# Patient Record
Sex: Female | Born: 1950
Health system: Southern US, Community
[De-identification: ages and names within clinical notes are randomized; demographics above are authoritative.]

## PROBLEM LIST (undated history)

## (undated) DIAGNOSIS — F329 Major depressive disorder, single episode, unspecified: Secondary | ICD-10-CM

## (undated) DIAGNOSIS — F32A Depression, unspecified: Secondary | ICD-10-CM

## (undated) DIAGNOSIS — I10 Essential (primary) hypertension: Secondary | ICD-10-CM

## (undated) DIAGNOSIS — E785 Hyperlipidemia, unspecified: Secondary | ICD-10-CM

## (undated) DIAGNOSIS — E119 Type 2 diabetes mellitus without complications: Secondary | ICD-10-CM

## (undated) DIAGNOSIS — N189 Chronic kidney disease, unspecified: Secondary | ICD-10-CM

## (undated) HISTORY — DX: Chronic kidney disease, unspecified: N18.9

## (undated) HISTORY — DX: Type 2 diabetes mellitus without complications: E11.9

## (undated) HISTORY — DX: Hyperlipidemia, unspecified: E78.5

## (undated) HISTORY — PX: OTHER SURGICAL HISTORY: SHX169

---
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
## 2018-10-10 ENCOUNTER — Other Ambulatory Visit: Payer: Self-pay

## 2018-10-10 ENCOUNTER — Inpatient Hospital Stay (HOSPITAL_COMMUNITY)
Admission: EM | Admit: 2018-10-10 | Discharge: 2018-11-18 | DRG: 177 | Disposition: A | Discharge status: Home / Self Care | Payer: Medicaid Other | Source: Other Acute Inpatient Hospital | Attending: Internal Medicine | Admitting: Internal Medicine

## 2018-10-10 ENCOUNTER — Emergency Department: Payer: HRSA Program

## 2018-10-10 ENCOUNTER — Encounter: Payer: Self-pay | Admitting: *Deleted

## 2018-10-10 ENCOUNTER — Emergency Department
Admission: EM | Admit: 2018-10-10 | Discharge: 2018-10-10 | Disposition: A | Discharge status: Other Acute Inpatient Hospital | Payer: HRSA Program | Attending: Emergency Medicine | Admitting: Emergency Medicine

## 2018-10-10 DIAGNOSIS — T380X5A Adverse effect of glucocorticoids and synthetic analogues, initial encounter: Secondary | ICD-10-CM | POA: Diagnosis not present

## 2018-10-10 DIAGNOSIS — I959 Hypotension, unspecified: Secondary | ICD-10-CM | POA: Diagnosis not present

## 2018-10-10 DIAGNOSIS — E11649 Type 2 diabetes mellitus with hypoglycemia without coma: Secondary | ICD-10-CM | POA: Diagnosis not present

## 2018-10-10 DIAGNOSIS — R0902 Hypoxemia: Secondary | ICD-10-CM | POA: Diagnosis not present

## 2018-10-10 DIAGNOSIS — U071 COVID-19: Secondary | ICD-10-CM | POA: Diagnosis present

## 2018-10-10 DIAGNOSIS — J1289 Other viral pneumonia: Secondary | ICD-10-CM | POA: Diagnosis present

## 2018-10-10 DIAGNOSIS — N179 Acute kidney failure, unspecified: Secondary | ICD-10-CM | POA: Diagnosis not present

## 2018-10-10 DIAGNOSIS — F418 Other specified anxiety disorders: Secondary | ICD-10-CM | POA: Diagnosis present

## 2018-10-10 DIAGNOSIS — I5033 Acute on chronic diastolic (congestive) heart failure: Secondary | ICD-10-CM | POA: Diagnosis present

## 2018-10-10 DIAGNOSIS — F419 Anxiety disorder, unspecified: Secondary | ICD-10-CM | POA: Diagnosis not present

## 2018-10-10 DIAGNOSIS — I10 Essential (primary) hypertension: Secondary | ICD-10-CM | POA: Diagnosis not present

## 2018-10-10 DIAGNOSIS — Z6841 Body Mass Index (BMI) 40.0 and over, adult: Secondary | ICD-10-CM

## 2018-10-10 DIAGNOSIS — G9340 Encephalopathy, unspecified: Secondary | ICD-10-CM | POA: Diagnosis not present

## 2018-10-10 DIAGNOSIS — Z91018 Allergy to other foods: Secondary | ICD-10-CM | POA: Diagnosis not present

## 2018-10-10 DIAGNOSIS — F329 Major depressive disorder, single episode, unspecified: Secondary | ICD-10-CM | POA: Diagnosis present

## 2018-10-10 DIAGNOSIS — J069 Acute upper respiratory infection, unspecified: Secondary | ICD-10-CM | POA: Diagnosis present

## 2018-10-10 DIAGNOSIS — R0602 Shortness of breath: Secondary | ICD-10-CM | POA: Diagnosis present

## 2018-10-10 DIAGNOSIS — E876 Hypokalemia: Secondary | ICD-10-CM | POA: Diagnosis present

## 2018-10-10 DIAGNOSIS — F32A Depression, unspecified: Secondary | ICD-10-CM | POA: Diagnosis present

## 2018-10-10 DIAGNOSIS — IMO0002 Reserved for concepts with insufficient information to code with codable children: Secondary | ICD-10-CM | POA: Diagnosis present

## 2018-10-10 DIAGNOSIS — J9601 Acute respiratory failure with hypoxia: Secondary | ICD-10-CM | POA: Diagnosis present

## 2018-10-10 DIAGNOSIS — I110 Hypertensive heart disease with heart failure: Secondary | ICD-10-CM | POA: Diagnosis present

## 2018-10-10 DIAGNOSIS — J189 Pneumonia, unspecified organism: Secondary | ICD-10-CM

## 2018-10-10 DIAGNOSIS — I471 Supraventricular tachycardia: Secondary | ICD-10-CM | POA: Diagnosis not present

## 2018-10-10 DIAGNOSIS — Z79899 Other long term (current) drug therapy: Secondary | ICD-10-CM | POA: Diagnosis not present

## 2018-10-10 DIAGNOSIS — R509 Fever, unspecified: Secondary | ICD-10-CM | POA: Diagnosis not present

## 2018-10-10 DIAGNOSIS — R739 Hyperglycemia, unspecified: Secondary | ICD-10-CM | POA: Diagnosis not present

## 2018-10-10 DIAGNOSIS — E1165 Type 2 diabetes mellitus with hyperglycemia: Secondary | ICD-10-CM | POA: Diagnosis present

## 2018-10-10 HISTORY — DX: Essential (primary) hypertension: I10

## 2018-10-10 HISTORY — DX: Depression, unspecified: F32.A

## 2018-10-10 LAB — CBC WITH DIFFERENTIAL/PLATELET
Abs Immature Granulocytes: 0.11 10*3/uL — ABNORMAL HIGH (ref 0.00–0.07)
Basophils Absolute: 0 10*3/uL (ref 0.0–0.1)
Basophils Relative: 0 %
Eosinophils Absolute: 0 10*3/uL (ref 0.0–0.5)
Eosinophils Relative: 0 %
HCT: 37.5 % (ref 36.0–46.0)
Hemoglobin: 12.6 g/dL (ref 12.0–15.0)
Immature Granulocytes: 1 %
Lymphocytes Relative: 8 %
Lymphs Abs: 1.3 10*3/uL (ref 0.7–4.0)
MCH: 30.8 pg (ref 26.0–34.0)
MCHC: 33.6 g/dL (ref 30.0–36.0)
MCV: 91.7 fL (ref 80.0–100.0)
Monocytes Absolute: 0.8 10*3/uL (ref 0.1–1.0)
Monocytes Relative: 5 %
Neutro Abs: 12.7 10*3/uL — ABNORMAL HIGH (ref 1.7–7.7)
Neutrophils Relative %: 86 %
Platelets: 327 10*3/uL (ref 150–400)
RBC: 4.09 MIL/uL (ref 3.87–5.11)
RDW: 14.4 % (ref 11.5–15.5)
WBC: 14.9 10*3/uL — ABNORMAL HIGH (ref 4.0–10.5)
nRBC: 0 % (ref 0.0–0.2)

## 2018-10-10 LAB — BLOOD GAS, ARTERIAL
Acid-Base Excess: 5.1 mmol/L — ABNORMAL HIGH (ref 0.0–2.0)
Bicarbonate: 27.8 mmol/L (ref 20.0–28.0)
FIO2: 1
O2 Saturation: 90.6 %
Patient temperature: 37
pCO2 arterial: 34 mmHg (ref 32.0–48.0)
pH, Arterial: 7.52 — ABNORMAL HIGH (ref 7.350–7.450)
pO2, Arterial: 53 mmHg — ABNORMAL LOW (ref 83.0–108.0)

## 2018-10-10 LAB — COMPREHENSIVE METABOLIC PANEL
ALT: 42 U/L (ref 0–44)
AST: 27 U/L (ref 15–41)
Albumin: 3.3 g/dL — ABNORMAL LOW (ref 3.5–5.0)
Alkaline Phosphatase: 42 U/L (ref 38–126)
Anion gap: 13 (ref 5–15)
BUN: 25 mg/dL — ABNORMAL HIGH (ref 8–23)
CO2: 26 mmol/L (ref 22–32)
Calcium: 8.7 mg/dL — ABNORMAL LOW (ref 8.9–10.3)
Chloride: 95 mmol/L — ABNORMAL LOW (ref 98–111)
Creatinine, Ser: 0.99 mg/dL (ref 0.44–1.00)
GFR calc Af Amer: >60 mL/min (ref >60)
GFR calc non Af Amer: 59 mL/min — ABNORMAL LOW (ref >60)
Glucose, Bld: 171 mg/dL — ABNORMAL HIGH (ref 70–99)
Potassium: 3.8 mmol/L (ref 3.5–5.1)
Sodium: 134 mmol/L — ABNORMAL LOW (ref 135–145)
Total Bilirubin: 0.6 mg/dL (ref 0.3–1.2)
Total Protein: 8.7 g/dL — ABNORMAL HIGH (ref 6.5–8.1)

## 2018-10-10 LAB — URINALYSIS, COMPLETE (UACMP) WITH MICROSCOPIC
Bacteria, UA: NONE SEEN
Bilirubin Urine: NEGATIVE
Glucose, UA: NEGATIVE mg/dL
Hgb urine dipstick: NEGATIVE
Ketones, ur: NEGATIVE mg/dL
Nitrite: NEGATIVE
Protein, ur: 30 mg/dL — AB
Specific Gravity, Urine: 1.02 (ref 1.005–1.030)
pH: 5 (ref 5.0–8.0)

## 2018-10-10 LAB — SARS CORONAVIRUS 2 BY RT PCR (HOSPITAL ORDER, PERFORMED IN ~~LOC~~ HOSPITAL LAB): SARS Coronavirus 2: POSITIVE — AB

## 2018-10-10 LAB — LACTIC ACID, PLASMA: Lactic Acid, Venous: 1.2 mmol/L (ref 0.5–1.9)

## 2018-10-10 LAB — FIBRIN DERIVATIVES D-DIMER (ARMC ONLY): Fibrin derivatives D-dimer (ARMC): 614.44 ng/mL (FEU) — ABNORMAL HIGH (ref 0.00–499.00)

## 2018-10-10 MED ORDER — SODIUM CHLORIDE 0.9 % IV SOLN
1.0000 g | Freq: Once | INTRAVENOUS | Status: AC
  Administered 2018-10-10: 21:00:00 1 g via INTRAVENOUS
  Filled 2018-10-10: qty 10

## 2018-10-10 MED ORDER — SODIUM CHLORIDE 0.9 % IV SOLN
500.0000 mg | Freq: Once | INTRAVENOUS | Status: AC
  Administered 2018-10-10: 500 mg via INTRAVENOUS
  Filled 2018-10-10: qty 500

## 2018-10-10 MED ORDER — IPRATROPIUM-ALBUTEROL 0.5-2.5 (3) MG/3ML IN SOLN
3.0000 mL | Freq: Once | RESPIRATORY_TRACT | Status: DC
  Filled 2018-10-10: qty 3

## 2018-10-10 MED ORDER — ALBUTEROL SULFATE HFA 108 (90 BASE) MCG/ACT IN AERS
4.0000 | INHALATION_SPRAY | Freq: Once | RESPIRATORY_TRACT | Status: AC
  Administered 2018-10-10: 4 via RESPIRATORY_TRACT
  Filled 2018-10-10: qty 6.7

## 2018-10-10 NOTE — ED Provider Notes (Signed)
[facility] Emergency Department Provider Note       Time seen: ----------------------------------------- 4:32 PM on 10/10/2018 -----------------------------------------   I have reviewed the triage vital signs and the nursing notes.  HISTORY   Chief Complaint No chief complaint on file.    HPI Christina Clark is a 68 y.o. female with no significant past medical history who presents to the ED for respiratory distress.  Patient arrived to an urgent care with shortness of breath.  Reportedly she has a family member that is COVID positive.  She has had fever and shortness of breath for the past several days.  She denies other complaints, was noted to have oxygen saturations in the 70s.  No past medical history on file.  There are no active problems to display for this patient.  Allergies Patient has no allergy information on record.  Social History Social History   Tobacco Use  . Smoking status: Not on file  Substance Use Topics  . Alcohol use: Not on file  . Drug use: Not on file    Review of Systems Constitutional: Positive for fever Cardiovascular: Negative for chest pain. Respiratory: Positive for shortness of breath Gastrointestinal: Negative for abdominal pain, vomiting and diarrhea. Musculoskeletal: Negative for back pain. Skin: Negative for rash. Neurological: Negative for headaches, focal weakness or numbness.  All systems negative/normal/unremarkable except as stated in the HPI  ____________________________________________   PHYSICAL EXAM:  VITAL SIGNS: ED Triage Vitals [10/10/18 1630]  Enc Vitals Group     BP      Pulse Rate 79     Resp      Temp 99.9 F (37.7 C)     Temp Source Axillary     SpO2      Weight      Height      Head Circumference      Peak Flow      Pain Score      Pain Loc      Pain Edu?      Excl. in GC?    Constitutional: Alert and oriented.  No obvious distress Eyes: Conjunctivae are  normal. Normal extraocular movements. ENT      Head: Normocephalic and atraumatic.      Nose: No congestion/rhinnorhea.      Mouth/Throat: Mucous membranes are moist.      Neck: No stridor. Cardiovascular: Normal rate, regular rhythm. No murmurs, rubs, or gallops. Respiratory: Mild to moderate tachypnea with clear breath sounds Gastrointestinal: Soft and nontender. Normal bowel sounds Musculoskeletal: Nontender with normal range of motion in extremities. No lower extremity tenderness nor edema. Neurologic:  Normal speech and language. No gross focal neurologic deficits are appreciated.  Skin:  Skin is warm, dry and intact. No rash noted. Psychiatric: Mood and affect are normal. Speech and behavior are normal.  ____________________________________________  EKG: Interpreted by me.  Sinus rhythm with a rate of 79 bpm, left bundle branch block, normal axis, long QT  ____________________________________________  ED COURSE:  As part of my medical decision making, I reviewed the following data within the electronic MEDICAL RECORD NUMBER History obtained from family if available, nursing notes, old chart and ekg, as well as notes from prior ED visits. Patient presented for dyspnea, we will assess with labs and imaging as indicated at this time.   Procedures  Christina Clark was evaluated in Emergency Department on 10/10/2018 for the symptoms described in the history of present illness. She was evaluated in the context of the  global COVID-19 pandemic, which necessitated consideration that the patient might be at risk for infection with the SARS-CoV-2 virus that causes COVID-19. Institutional protocols and algorithms that pertain to the evaluation of patients at risk for COVID-19 are in a state of rapid change based on information released by regulatory bodies including the CDC and federal and state organizations. These policies and algorithms were followed during the patient's care in the ED.   ____________________________________________   CRITICAL CARE Performed by: Ulice DashJohnathan E Booker Bhatnagar   Total critical care time: 30 minutes  Critical care time was exclusive of separately billable procedures and treating other patients.  Critical care was necessary to treat or prevent imminent or life-threatening deterioration.  Critical care was time spent personally by me on the following activities: development of treatment plan with patient and/or surrogate as well as nursing, discussions with consultants, evaluation of patient's response to treatment, examination of patient, obtaining history from patient or surrogate, ordering and performing treatments and interventions, ordering and review of laboratory studies, ordering and review of radiographic studies, pulse oximetry and re-evaluation of patient's condition.  LABS (pertinent positives/negatives)  Labs Reviewed  SARS CORONAVIRUS 2 (HOSPITAL ORDER, PERFORMED IN  HOSPITAL LAB) - Abnormal; Notable for the following components:      Result Value   SARS Coronavirus 2 POSITIVE (*)    All other components within normal limits  COMPREHENSIVE METABOLIC PANEL - Abnormal; Notable for the following components:   Sodium 134 (*)    Chloride 95 (*)    Glucose, Bld 171 (*)    BUN 25 (*)    Calcium 8.7 (*)    Total Protein 8.7 (*)    Albumin 3.3 (*)    GFR calc non Af Amer 59 (*)    All other components within normal limits  CBC WITH DIFFERENTIAL/PLATELET - Abnormal; Notable for the following components:   WBC 14.9 (*)    Neutro Abs 12.7 (*)    Abs Immature Granulocytes 0.11 (*)    All other components within normal limits  BLOOD GAS, ARTERIAL - Abnormal; Notable for the following components:   pH, Arterial 7.52 (*)    pO2, Arterial 53 (*)    Acid-Base Excess 5.1 (*)    All other components within normal limits  FIBRIN DERIVATIVES D-DIMER ([facility] ONLY) - Abnormal; Notable for the following components:   Fibrin derivatives  D-dimer ([facility]) 614.44 (*)    All other components within normal limits  CULTURE, BLOOD (ROUTINE X 2)  CULTURE, BLOOD (ROUTINE X 2)  URINE CULTURE  LACTIC ACID, PLASMA  LACTIC ACID, PLASMA  URINALYSIS, COMPLETE (UACMP) WITH MICROSCOPIC    RADIOLOGY Images were viewed by me  Chest x-ray IMPRESSION: Bilateral streaky airspace opacities which may indicate multifocal infection. ____________________________________________   DIFFERENTIAL DIAGNOSIS   COVID-19, hypoxia, PE, anemia  FINAL ASSESSMENT AND PLAN  COVID-19, hypoxia   Plan: The patient had presented for shortness of breath and hypoxia. Patient's labs did reveal leukocytosis but otherwise no acute process. Patient's imaging was concerning for multifocal infection.  She remains on a nonrebreather at this time with oxygen saturations in the mid 80s.  I have [place] in [place].  She remains in the mid 80s but feels like the oxygen is still helping her.  We will not intubate at this time.  I will discuss with the hospital for transfer.  Ulice DashJohnathan E Krystalynn Ridgeway, MD    Note: This note was generated in part or whole with voice recognition software.  Voice recognition is usually quite accurate but there are transcription errors that can and very often do occur. I apologize for any typographical errors that were not detected and corrected.     [name], MD 10/10/18 [phone]

## 2018-10-10 NOTE — ED Provider Notes (Signed)
Patient was accepted in transfer by Christina Clark to the Christina Clark.   [name], MD 10/10/18 Einar Crow

## 2018-10-10 NOTE — ED Notes (Signed)
Pt O2 sat averaging 85% on 15L via non-rebreather. Dr Jimmye Norman aware of sats and placed acceptable parameters at mid-eighties and higher. Monitor alarm adjusted to reflect.

## 2018-10-10 NOTE — ED Provider Notes (Signed)
Patient was doing very well on BiPAP with sats in the mid 90s.  She appears stable for transport on a nonrebreather.   [name], MD 10/10/18 2142

## 2018-10-10 NOTE — ED Notes (Signed)
Pt was informed of diagnosis and reason for transport to [facility] through use of the tele interpreter. Pt verbalized understanding and gave verbal consent.

## 2018-10-10 NOTE — Progress Notes (Signed)
CODE SEPSIS - PHARMACY COMMUNICATION  **Broad Spectrum Antibiotics should be administered within 1 hour of Sepsis diagnosis**  Time Code Sepsis Called/Page Received: 1635  Antibiotics Ordered: None  Spoke with Dr. Jimmye Norman - no abx needed. Pt has COVID.   Christina Clark ,PharmD, BCPS Clinical Pharmacist  10/10/2018  4:52 PM

## 2018-10-10 NOTE — ED Notes (Signed)
[facility]  CALLED  FOR  TRANSFER 

## 2018-10-10 NOTE — ED Notes (Signed)
TRIAGE NOTE: Pt to ED from [facility] for SOB and cough. Pt was reported to have an oxygen saturation of 75% on 15L when EMS arrived on scene. Pt was 88% on 15L NRB upon arrival to ED.   Pt reported to have been living with a family member dx with COVID last week.

## 2018-10-11 ENCOUNTER — Encounter (HOSPITAL_COMMUNITY): Payer: Self-pay

## 2018-10-11 DIAGNOSIS — E1165 Type 2 diabetes mellitus with hyperglycemia: Secondary | ICD-10-CM | POA: Diagnosis present

## 2018-10-11 DIAGNOSIS — IMO0002 Reserved for concepts with insufficient information to code with codable children: Secondary | ICD-10-CM | POA: Diagnosis present

## 2018-10-11 DIAGNOSIS — I10 Essential (primary) hypertension: Secondary | ICD-10-CM

## 2018-10-11 DIAGNOSIS — R739 Hyperglycemia, unspecified: Secondary | ICD-10-CM

## 2018-10-11 LAB — FERRITIN: Ferritin: 493 ng/mL — ABNORMAL HIGH (ref 11–307)

## 2018-10-11 LAB — D-DIMER, QUANTITATIVE: D-Dimer, Quant: 0.66 ug/mL-FEU — ABNORMAL HIGH (ref 0.00–0.50)

## 2018-10-11 LAB — TROPONIN I
Troponin I: <0.03 ng/mL (ref <0.03)
Troponin I: <0.03 ng/mL (ref <0.03)

## 2018-10-11 LAB — C-REACTIVE PROTEIN: CRP: 33.4 mg/dL — ABNORMAL HIGH (ref <1.0)

## 2018-10-11 LAB — PROCALCITONIN: Procalcitonin: 0.1 ng/mL

## 2018-10-11 LAB — ABO/RH: ABO/RH(D): A POS

## 2018-10-11 LAB — HIV ANTIBODY (ROUTINE TESTING W REFLEX): HIV Screen 4th Generation wRfx: NONREACTIVE

## 2018-10-11 LAB — HEMOGLOBIN A1C
Hgb A1c MFr Bld: 8.1 % — ABNORMAL HIGH (ref 4.8–5.6)
Mean Plasma Glucose: 185.77 mg/dL

## 2018-10-11 LAB — LACTATE DEHYDROGENASE: LDH: 264 U/L — ABNORMAL HIGH (ref 98–192)

## 2018-10-11 MED ORDER — SODIUM CHLORIDE 0.9% FLUSH
3.0000 mL | INTRAVENOUS | Status: DC | PRN
  Administered 2018-10-20: 19:00:00 3 mL via INTRAVENOUS
  Filled 2018-10-11: qty 3

## 2018-10-11 MED ORDER — TOCILIZUMAB 400 MG/20ML IV SOLN
800.0000 mg | Freq: Once | INTRAVENOUS | Status: AC
  Administered 2018-10-11: 16:00:00 800 mg via INTRAVENOUS
  Filled 2018-10-11: qty 40

## 2018-10-11 MED ORDER — SODIUM CHLORIDE 0.9 % IV SOLN
200.0000 mg | Freq: Once | INTRAVENOUS | Status: AC
  Administered 2018-10-11: 200 mg via INTRAVENOUS
  Filled 2018-10-11: qty 40

## 2018-10-11 MED ORDER — ONDANSETRON HCL 4 MG PO TABS: 4.0000 mg | ORAL_TABLET | Freq: Four times a day (QID) | ORAL | Status: DC | PRN

## 2018-10-11 MED ORDER — SODIUM CHLORIDE 0.9 % IV SOLN
250.0000 mL | INTRAVENOUS | Status: DC | PRN
  Administered 2018-10-11: 16:00:00 250 mL via INTRAVENOUS

## 2018-10-11 MED ORDER — ENOXAPARIN SODIUM 120 MG/0.8ML ~~LOC~~ SOLN
1.0000 mg/kg | Freq: Two times a day (BID) | SUBCUTANEOUS | Status: DC
  Administered 2018-10-11 (×2): 110 mg via SUBCUTANEOUS
  Filled 2018-10-11 (×4): qty 0.73

## 2018-10-11 MED ORDER — SODIUM CHLORIDE 0.9% FLUSH
3.0000 mL | Freq: Two times a day (BID) | INTRAVENOUS | Status: DC
  Administered 2018-10-11 – 2018-10-24 (×26): 3 mL via INTRAVENOUS

## 2018-10-11 MED ORDER — ACETAMINOPHEN 325 MG PO TABS
650.0000 mg | ORAL_TABLET | Freq: Four times a day (QID) | ORAL | Status: DC | PRN
  Administered 2018-11-01 – 2018-11-07 (×3): 650 mg via ORAL
  Filled 2018-10-11 (×4): qty 2

## 2018-10-11 MED ORDER — ENOXAPARIN SODIUM 60 MG/0.6ML ~~LOC~~ SOLN
0.5000 mg/kg | Freq: Two times a day (BID) | SUBCUTANEOUS | Status: DC
  Administered 2018-10-11 – 2018-11-13 (×65): 55 mg via SUBCUTANEOUS
  Filled 2018-10-11 (×3): qty 0.55
  Filled 2018-10-11 (×2): qty 0.6
  Filled 2018-10-11: qty 0.55
  Filled 2018-10-11 (×2): qty 0.6
  Filled 2018-10-11 (×2): qty 0.55
  Filled 2018-10-11 (×5): qty 0.6
  Filled 2018-10-11: qty 0.55
  Filled 2018-10-11 (×7): qty 0.6
  Filled 2018-10-11: qty 0.55
  Filled 2018-10-11: qty 0.6
  Filled 2018-10-11: qty 0.55
  Filled 2018-10-11: qty 0.6
  Filled 2018-10-11: qty 0.55
  Filled 2018-10-11 (×4): qty 0.6
  Filled 2018-10-11: qty 0.55
  Filled 2018-10-11: qty 0.6
  Filled 2018-10-11: qty 0.55
  Filled 2018-10-11: qty 0.6
  Filled 2018-10-11 (×2): qty 0.55
  Filled 2018-10-11 (×5): qty 0.6
  Filled 2018-10-11: qty 0.55
  Filled 2018-10-11: qty 0.6
  Filled 2018-10-11 (×2): qty 0.55
  Filled 2018-10-11 (×3): qty 0.6
  Filled 2018-10-11: qty 0.55
  Filled 2018-10-11 (×4): qty 0.6
  Filled 2018-10-11 (×2): qty 0.55
  Filled 2018-10-11: qty 0.6
  Filled 2018-10-11: qty 0.55
  Filled 2018-10-11: qty 0.6
  Filled 2018-10-11: qty 0.55
  Filled 2018-10-11 (×2): qty 0.6
  Filled 2018-10-11 (×6): qty 0.55
  Filled 2018-10-11 (×2): qty 0.6

## 2018-10-11 MED ORDER — METHYLPREDNISOLONE SODIUM SUCC 125 MG IJ SOLR
80.0000 mg | Freq: Three times a day (TID) | INTRAMUSCULAR | Status: DC
  Administered 2018-10-11 – 2018-10-14 (×11): 80 mg via INTRAVENOUS
  Filled 2018-10-11 (×11): qty 2

## 2018-10-11 MED ORDER — ONDANSETRON HCL 4 MG/2ML IJ SOLN: 4.0000 mg | Freq: Four times a day (QID) | INTRAMUSCULAR | Status: DC | PRN

## 2018-10-11 MED ORDER — POLYETHYLENE GLYCOL 3350 17 G PO PACK
17.0000 g | PACK | Freq: Every day | ORAL | Status: DC | PRN
  Administered 2018-10-28 – 2018-11-03 (×5): 17 g via ORAL
  Filled 2018-10-11 (×7): qty 1

## 2018-10-11 MED ORDER — SODIUM CHLORIDE 0.9 % IV SOLN
100.0000 mg | INTRAVENOUS | Status: AC
  Administered 2018-10-12 – 2018-10-15 (×4): 100 mg via INTRAVENOUS
  Filled 2018-10-11 (×4): qty 20

## 2018-10-11 NOTE — Progress Notes (Signed)
Recv'd pt via EMS transport on nonrebreather mask @ 15 lpm, RR >30, SPO2 on monitor 85%, A&Ox4, NSR and afebrile. Pt has no c/o pain. Pt does not seem in distress. Pt states that she is just cold and short of breath. Highest SPO2 on moniotr gets is 87%. Pt is spanish speaking, admission and unit orientation given via translator. Discussed with patient the importance of proning and how to do it. Pt states that she does not like her head down because she cant breath that way. Skin assessment and CHG bath performed by myself and RN Abigail. Pt skin intact. Awaiting orders from MD.

## 2018-10-11 NOTE — Progress Notes (Signed)
PROGRESS NOTE  Brief Narrative: Christina Clark is a 68 y.o. female with a history of morbid obesity, HTN, and depression who presented from home with about 1 week of progressive fever, dry cough, and shortness of breath in the setting of known covid-19 contacts. She was hypoxic and had bilateral infiltrates on CXR, and was transferred to [facility], admitted by Dr. Maryland Pink this morning.   Subjective: Spanish-speaking RN at bedside performing interpretation during encounter. Patient feels about the same as she did when admitted, moderately short of breath without chest pain.   Objective: BP (!) 143/64   Pulse 86   Temp 99 F (37.2 C) (Oral)   Resp (!) 28   Ht 5\' 4"  (1.626 m)   Wt 108.7 kg   SpO2 90%   BMI 41.13 kg/m   Gen: No distress, obese Pulm: Clear, no wheezes, tachypneic with HFNC.  CV: RRR, no murmur, no JVD, no edema GI: Soft, NT, ND, +BS  Neuro: Alert and oriented. No focal deficits. Skin: No rashes, lesions or ulcers  Troponin negative x2 Procalcitonin 0.10 not consistent with bacterial infection CRP grossly elevated at 33.4.   Assessment & Plan: Acute respiratory failure with hypoxia due to covid-19 pneumonia:  - Continue airborne, contact precautions. PPE including surgical gown, gloves, face shield, cap, shoe covers, and N-95 used during this encounter in a negative pressure room.  - Check daily labs: CBC w/diff, CMP, d-dimer, fibrinogen, ferritin, LDH, CRP - Enoxaparin 0.5mg /kg q12h per ICU protocol - Maintain euvolemia/net negative.  - Avoid NSAIDs - Recommend proning and aggressive use of incentive spirometry. - Start remdesivir, planning 5 day course, and given actemra x1. The treatment plan and use of medications and known side effects were discussed with patient, they were clearly explained that this medication is being used based on early clinical trials. Complete risks and long-term side effects are unknown, however in the best clinical judgment they seem to  be of some clinical benefit rather than medical risks.  Patient/family agree with the treatment plan and want to receive the given medications. - Discussed with her daughter this afternoon.  [name], MD Pager [phone] 10/11/2018, 2:52 PM

## 2018-10-11 NOTE — Progress Notes (Signed)
Offered pt chocolate ice cream that came with lunch tray but pt sts she is "allergic" to it.... Asked what kind of sx she gets when she eats chocolate and she sts it more of s/e and will start to have vertigo/dizziness sx.... updated in Epic.

## 2018-10-11 NOTE — Progress Notes (Signed)
Pt is sitting at bedside and eating... O2 sat = 88-95%, NSR = 80s, RR = High 20s to mid 30s  Pt denies SOB or pain.... She is A&O x4... no acute distress  Will continue to monitor

## 2018-10-11 NOTE — Progress Notes (Signed)
Spoke w/daughter, Dorise Bullion, at [phone]  Daughter has been updated and answered any questions she had.... Pt was able to speak w/duaghter as well

## 2018-10-11 NOTE — H&P (Addendum)
h         History and Physical  Christina Clark ZOX:[id number] DOB: 1950/09/18 DOA: 10/10/2018  Referring physician: Daryel NovemberJonathan Williams, ER physician PCP: Patient, No Pcp Per  Outpatient Specialists: None Patient coming from: Home & is able to ambulate without assistance  Chief Complaint: Shortness of breath  Please note that patient only speaks Spanish.  H&P was done with use of Spanish speaking nurse present at the bedside  HPI: Christina Clark is a 68 y.o. female with medical history significant for hypertension, depression and morbid obesity who has been living at home and proximally started having fevers 4 days ago.  Patient states multiple family members have tested positive for COVID-19.  Patient started feeling short of breath approximately 2 days ago and symptoms persisting, she came into the emergency room on the evening of 6/10.  ED Course: In the emergency room, patient was noted to be hypoxic requiring additional oxygen supplementation to where she needed 100% high flow oxygen just to maintain oxygen saturations at 88%.  Putting her in a prone position, she was able to get over 90%.  She seemed to be more comfortable with this.  Chest x-ray noted bilateral opacities consistent with multi lobar infection.  Rest the patient's blood work was unremarkable other than a white count of 14.9 and a lactic acid level of 1.2.  D-dimer was elevated at 614.  Patient did have a positive test for COVID-19.  He [facility]  Review of Systems: Patient seen after arrival to floor. Pt states that with her oxygen, she actually feels better and does not really have any complaints.  She has a mild cough, nonproductive  Pt denies any headaches, vision changes, dysphagia, chest pain, palpitations, wheezing, abdominal pain, hematuria, dysuria, constipation, diarrhea, focal extremity numbness weakness or pain.  Review of systems are otherwise  negative   Past Medical History:  Diagnosis Date  . Depression    After husbands death, takes medication  . Hypertension    History reviewed. No pertinent surgical history.  Social History:  reports that she has never smoked. She has never used smokeless tobacco. She reports previous alcohol use. She reports that she does not use drugs.  She says that she ambulates without assistance.  Lives at home with family   No Known Allergies  Family history inquired: Patient states that no medical problems run in her family  Prior to Admission medications   Not on File  Patient is unsure of her home medications.  She said that we would need to ask her daughter  Physical Exam: BP (!) 135/59   Pulse 85   Temp 98.4 F (36.9 C) (Axillary)   Resp (!) 37   Ht 5\' 4"  (1.626 m)   Wt 108.7 kg   SpO2 90%   BMI 41.13 kg/m   General: Alert and oriented x3, no acute distress Eyes: Sclera nonicteric, extraocular movements are intact ENT: Normocephalic and atraumatic, mucous membranes are moist Neck: Thick, narrow airway Cardiovascular: Regular rate and rhythm, S1-S2 Respiratory: Clear to auscultation bilaterally, moderate effort Abdomen: Soft, nontender, nondistended, positive bowel sounds Skin: No skin breaks, tears or lesions Musculoskeletal: No clubbing or cyanosis or edema Psychiatric: Appropriate, no evidence of psychoses Neurologic: No focal deficits          Labs on Admission:  Basic Metabolic Panel: Recent Labs  Lab 10/10/18 1631  NA 134*  K 3.8  CL 95*  CO2 26  GLUCOSE 171*  BUN 25*  CREATININE 0.99  CALCIUM 8.7*   Liver Function Tests: Recent Labs  Lab 10/10/18 1631  AST 27  ALT 42  ALKPHOS 42  BILITOT 0.6  PROT 8.7*  ALBUMIN 3.3*   No results for input(s): LIPASE, AMYLASE in the last 168 hours. No results for input(s): AMMONIA in the last 168 hours. CBC: Recent Labs  Lab 10/10/18 1631  WBC 14.9*  NEUTROABS 12.7*  HGB 12.6  HCT 37.5  MCV 91.7  PLT  327   Cardiac Enzymes: No results for input(s): CKTOTAL, CKMB, CKMBINDEX, TROPONINI in the last 168 hours.  BNP (last 3 results) No results for input(s): BNP in the last 8760 hours.  ProBNP (last 3 results) No results for input(s): PROBNP in the last 8760 hours.  CBG: No results for input(s): GLUCAP in the last 168 hours.  Radiological Exams on Admission: Dg Chest Port 1 View  Result Date: 10/10/2018 CLINICAL DATA:  Fever and shortness of breath EXAM: PORTABLE CHEST 1 VIEW COMPARISON:  None. FINDINGS: There are bilateral streaky airspace opacities, worst in the right upper lobe. Cardiomediastinal contours are normal. No pneumothorax or sizable pleural effusion. IMPRESSION: Bilateral streaky airspace opacities which may indicate multifocal infection. Electronically Signed   By: Ulyses Jarred M.D.   On: 10/10/2018 17:30    EKG: Sinus rhythm with left bundle branch block.  There is no previous EKG to compare to  Assessment/Plan Present on Admission: . Acute respiratory failure with hypoxia ([place]) secondary to COVID-19 virus: Patient with significant hypoxia.  At this time, she is requiring maximum high flow oxygen just to maintain oxygen saturations greater than 90%.  She is at high risk of further respiratory failure and possible ventilator support.  She is actually a bit more comfortable now.  CRP pending and if greater than 7, will start Actemra.  IV steroids ordered.  Initiated protocol for Remdisivir.   Marland Kitchen Hypertension: Patient states that she is unsure what medication she takes for this.  Will need to clarify from her daughter.  . Depression: Patient states that she is unsure of what medicines she takes for this.  Will need to clarify from family.  Morbid obesity: Patient meets criteria with BMI greater than 40  Hyperglycemia: Mild.  Will check A1c.  Principal Problem:   Acute respiratory failure with hypoxia (HCC) Active Problems:   Hypertension   Depression   Acute  respiratory disease due to COVID-19 virus   Morbid obesity with BMI of 40.0-44.9, adult (HCC)   DVT prophylaxis: On Lovenox, adjusted for COVID  Code Status: Full code as confirmed by patient  Family Communication: We will discuss with family later today  Disposition Plan: Hopefully patient's oxygenation will improve and we can discharge home  Consults called: None  Admission status: Given need for acute hospital services and patient expected to be her past 2 midnights, admit as inpatient to ICU  Spent 40 minutes in the care of this critically ill patient including medical decision making, face-to-face examination and review of patient's records/labs/radiology studies   Annita Brod MD [facility] Pager [phone]  If 7PM-7AM, please contact night-coverage [email or web address] Password [email or web address]  10/11/2018, 6:01 AM

## 2018-10-11 NOTE — Consult Note (Signed)
NAME:  Christina Clark, MRN:  [id number], DOB:  [birth year], LOS: 1 ADMISSION DATE:  10/10/2018, CONSULTATION DATE:  10/11/2018 REFERRING MD:  Mathews Robinsons MD, CHIEF COMPLAINT: COVID 46 pneumonia  Brief History   68 year old with hypertension, morbid obesity, depression admitted with COVID-19 pneumonia, acute respiratory failure requiring high flow nasal cannula.  Past Medical History  Hypertension, morbid obesity, depression  Significant Hospital Events   6/11- Admit  Consults:  PCCM  Procedures:    Significant Diagnostic Tests:    Micro Data:    Antimicrobials/COVID therapy  Remdesivir Steroids  Actemra  Interim history/subjective:    Objective   Blood pressure (!) 143/64, pulse 86, temperature 99 F (37.2 C), temperature source Oral, resp. rate (!) 28, height 5\' 4"  (1.626 m), weight 108.7 kg, SpO2 90 %.    FiO2 (%):  [100 %] 100 %   Intake/Output Summary (Last 24 hours) at 10/11/2018 1350 Last data filed at 10/11/2018 1300 Gross per 24 hour  Intake 103 ml  Output 0 ml  Net 103 ml   Filed Weights   10/10/18 2300  Weight: 108.7 kg    Examination: Gen:      No acute distress, obese HEENT:  EOMI, sclera anicteric Neck:     No masses; no thyromegaly Lungs:    Clear to auscultation bilaterally; normal respiratory effort CV:         Regular rate and rhythm; no murmurs Abd:      + bowel sounds; soft, non-tender; no palpable masses, no distension Ext:    No edema; adequate peripheral perfusion Skin:      Warm and dry; no rash Neuro: alert and oriented x 3 Psych: normal mood and affect  Resolved Hospital Problem list     Assessment & Plan:  68 year old with COVID-19 pneumonia  Severe respiratory failure Sats are stable on high flow nasal cannula Does not need intubation at present Encourage weight pronating although she prefers to lie on her side down her stomach Monitoring in ICU  COVID-19 pneumonia Started on steroids, Remicade severe, Actemra  Discussed convalescent plasma with patient.  She does not want to enroll in the study.  Best practice:  Diet: NPO Pain/Anxiety/Delirium protocol (if indicated): NA VAP protocol (if indicated): NA DVT prophylaxis: Lovenox GI prophylaxis: NA Glucose control: Monitor sugars Mobility: Bed Code Status: Full Family Communication: per TRH Disposition: ICU  Labs   CBC: Recent Labs  Lab 10/10/18 1631  WBC 14.9*  NEUTROABS 12.7*  HGB 12.6  HCT 37.5  MCV 91.7  PLT 983    Basic Metabolic Panel: Recent Labs  Lab 10/10/18 1631  NA 134*  K 3.8  CL 95*  CO2 26  GLUCOSE 171*  BUN 25*  CREATININE 0.99  CALCIUM 8.7*   GFR: Estimated Creatinine Clearance: 67.3 mL/min (by C-G formula based on SCr of 0.99 mg/dL). Recent Labs  Lab 10/10/18 1631 10/11/18 0550  PROCALCITON  --  0.10  WBC 14.9*  --   LATICACIDVEN 1.2  --     Liver Function Tests: Recent Labs  Lab 10/10/18 1631  AST 27  ALT 42  ALKPHOS 42  BILITOT 0.6  PROT 8.7*  ALBUMIN 3.3*   No results for input(s): LIPASE, AMYLASE in the last 168 hours. No results for input(s): AMMONIA in the last 168 hours.  ABG    Component Value Date/Time   PHART 7.52 (H) 10/10/2018 1647   PCO2ART 34 10/10/2018 1647   PO2ART 53 (L) 10/10/2018 1647   HCO3 27.8  10/10/2018 1647   O2SAT 90.6 10/10/2018 1647     Coagulation Profile: No results for input(s): INR, PROTIME in the last 168 hours.  Cardiac Enzymes: Recent Labs  Lab 10/11/18 0550 10/11/18 1140  TROPONINI <0.03 <0.03    HbA1C: Hgb A1c MFr Bld  Date/Time Value Ref Range Status  10/11/2018 05:50 AM 8.1 (H) 4.8 - 5.6 % Final    Comment:    (NOTE) Pre diabetes:          5.7%-6.4% Diabetes:              >6.4% Glycemic control for   <7.0% adults with diabetes     CBG: No results for input(s): GLUCAP in the last 168 hours.  Review of Systems:     All negative; except for those that are bolded, which indicate positives.  Constitutional: weight loss,  weight gain, night sweats, fevers, chills, fatigue, weakness.  HEENT: headaches, sore throat, sneezing, nasal congestion, post nasal drip, difficulty swallowing, tooth/dental problems, visual complaints, visual changes, ear aches. Neuro: difficulty with speech, weakness, numbness, ataxia. CV:  chest pain, orthopnea, PND, swelling in lower extremities, dizziness, palpitations, syncope.  Resp: cough, hemoptysis, dyspnea, wheezing. GI: heartburn, indigestion, abdominal pain, nausea, vomiting, diarrhea, constipation, change in bowel habits, loss of appetite, hematemesis, melena, hematochezia.  GU: dysuria, change in color of urine, urgency or frequency, flank pain, hematuria. MSK: joint pain or swelling, decreased range of motion. Psych: change in mood or affect, depression, anxiety, suicidal ideations, homicidal ideations. Skin: rash, itching, bruising.  Past Medical History  She,  has a past medical history of Depression and Hypertension.   Surgical History   History reviewed. No pertinent surgical history.   Social History   reports that she has never smoked. She has never used smokeless tobacco. She reports previous alcohol use. She reports that she does not use drugs.   Family History   Her family history is not on file.   Allergies Allergies  Allergen Reactions  . Chocolate     Reports vertigo sx     Home Medications  Prior to Admission medications   Medication Sig Start Date End Date Taking? Authorizing Provider  hydrochlorothiazide (HYDRODIURIL) 25 MG tablet Take 25 mg by mouth daily.   Yes [provider]  losartan (COZAAR) 50 MG tablet Take 50 mg by mouth daily.   Yes [provider]  sertraline (ZOLOFT) 50 MG tablet Take 50 mg by mouth daily.   Yes [provider]    The patient is critically ill with multiple organ system failure and requires high complexity decision making for assessment and support, frequent evaluation and titration of  therapies, advanced monitoring, review of radiographic studies and interpretation of complex data.   Critical Care Time devoted to patient care services, exclusive of separately billable procedures, described in this note is 35 minutes.   Chilton GreathousePraveen Kamon Fahr MD [facility] Pager [phone] If no answer call [phone] 10/11/2018, 1:58 PM

## 2018-10-11 NOTE — Progress Notes (Signed)
RT NOTE:  Pt arrived by [facility] on NR[facility], Spo2 83%. Pt switched to Heated HFNC 40L / 100%, Spo2 increased to 94%. RT will monitor and titrate per patient needs.

## 2018-10-11 NOTE — Progress Notes (Signed)
Spoke with patient's daughter, Saleema, Weppler [phone] and gave her updates on patient. I told Sheala that we could Face Time with her a little later so that she could see her mother. Patient will call if she has further questions regarding her mothers condition.

## 2018-10-11 NOTE — Progress Notes (Signed)
Pt SPO2 continues at 85-87%, MD aware. However, pt WOB has increased.  She is moaning and grunting. Had RT assess, and pt will be put on heated Hi-Flow in hopes to improve wob. MD is ok w/SPO2 where it is as long as she is asymptomatic.

## 2018-10-12 LAB — CBC WITH DIFFERENTIAL/PLATELET
Abs Immature Granulocytes: 0.22 10*3/uL — ABNORMAL HIGH (ref 0.00–0.07)
Basophils Absolute: 0 10*3/uL (ref 0.0–0.1)
Basophils Relative: 0 %
Eosinophils Absolute: 0.1 10*3/uL (ref 0.0–0.5)
Eosinophils Relative: 0 %
HCT: 39 % (ref 36.0–46.0)
Hemoglobin: 12.7 g/dL (ref 12.0–15.0)
Immature Granulocytes: 1 %
Lymphocytes Relative: 5 %
Lymphs Abs: 1.1 10*3/uL (ref 0.7–4.0)
MCH: 30.4 pg (ref 26.0–34.0)
MCHC: 32.6 g/dL (ref 30.0–36.0)
MCV: 93.3 fL (ref 80.0–100.0)
Monocytes Absolute: 0.5 10*3/uL (ref 0.1–1.0)
Monocytes Relative: 2 %
Neutro Abs: 21.4 10*3/uL — ABNORMAL HIGH (ref 1.7–7.7)
Neutrophils Relative %: 92 %
Platelets: 421 10*3/uL — ABNORMAL HIGH (ref 150–400)
RBC: 4.18 MIL/uL (ref 3.87–5.11)
RDW: 14.4 % (ref 11.5–15.5)
WBC: 23.2 10*3/uL — ABNORMAL HIGH (ref 4.0–10.5)
nRBC: 0 % (ref 0.0–0.2)

## 2018-10-12 LAB — COMPREHENSIVE METABOLIC PANEL
ALT: 45 U/L — ABNORMAL HIGH (ref 0–44)
AST: 21 U/L (ref 15–41)
Albumin: 3.1 g/dL — ABNORMAL LOW (ref 3.5–5.0)
Alkaline Phosphatase: 46 U/L (ref 38–126)
Anion gap: 16 — ABNORMAL HIGH (ref 5–15)
BUN: 39 mg/dL — ABNORMAL HIGH (ref 8–23)
CO2: 24 mmol/L (ref 22–32)
Calcium: 8.8 mg/dL — ABNORMAL LOW (ref 8.9–10.3)
Chloride: 94 mmol/L — ABNORMAL LOW (ref 98–111)
Creatinine, Ser: 0.89 mg/dL (ref 0.44–1.00)
GFR calc Af Amer: >60 mL/min (ref >60)
GFR calc non Af Amer: >60 mL/min (ref >60)
Glucose, Bld: 269 mg/dL — ABNORMAL HIGH (ref 70–99)
Potassium: 3.6 mmol/L (ref 3.5–5.1)
Sodium: 134 mmol/L — ABNORMAL LOW (ref 135–145)
Total Bilirubin: 0.2 mg/dL — ABNORMAL LOW (ref 0.3–1.2)
Total Protein: 8.9 g/dL — ABNORMAL HIGH (ref 6.5–8.1)

## 2018-10-12 LAB — D-DIMER, QUANTITATIVE: D-Dimer, Quant: 0.83 ug/mL-FEU — ABNORMAL HIGH (ref 0.00–0.50)

## 2018-10-12 LAB — URINE CULTURE: Special Requests: NORMAL

## 2018-10-12 LAB — LACTATE DEHYDROGENASE: LDH: 270 U/L — ABNORMAL HIGH (ref 98–192)

## 2018-10-12 LAB — INTERLEUKIN-6, PLASMA: Interleukin-6, Plasma: 42.5 pg/mL — ABNORMAL HIGH (ref 0.0–12.2)

## 2018-10-12 LAB — FERRITIN: Ferritin: 685 ng/mL — ABNORMAL HIGH (ref 11–307)

## 2018-10-12 LAB — C-REACTIVE PROTEIN: CRP: 31.9 mg/dL — ABNORMAL HIGH (ref <1.0)

## 2018-10-12 LAB — GLUCOSE, CAPILLARY
Glucose-Capillary: 303 mg/dL — ABNORMAL HIGH (ref 70–99)
Glucose-Capillary: 322 mg/dL — ABNORMAL HIGH (ref 70–99)
Glucose-Capillary: 291 mg/dL — ABNORMAL HIGH (ref 70–99)

## 2018-10-12 MED ORDER — LOSARTAN POTASSIUM 50 MG PO TABS
50.0000 mg | ORAL_TABLET | Freq: Every day | ORAL | Status: DC
  Administered 2018-10-13 – 2018-10-18 (×6): 50 mg via ORAL
  Filled 2018-10-12 (×7): qty 1

## 2018-10-12 MED ORDER — INSULIN ASPART 100 UNIT/ML ~~LOC~~ SOLN
0.0000 [IU] | Freq: Every day | SUBCUTANEOUS | Status: DC
  Administered 2018-10-12 – 2018-10-13 (×2): 3 [IU] via SUBCUTANEOUS

## 2018-10-12 MED ORDER — HYDROCHLOROTHIAZIDE 25 MG PO TABS
25.0000 mg | ORAL_TABLET | Freq: Every day | ORAL | Status: DC
  Administered 2018-10-13 – 2018-10-18 (×6): 25 mg via ORAL
  Filled 2018-10-12 (×7): qty 1

## 2018-10-12 MED ORDER — INSULIN ASPART 100 UNIT/ML ~~LOC~~ SOLN
0.0000 [IU] | Freq: Three times a day (TID) | SUBCUTANEOUS | Status: DC
  Administered 2018-10-12: 17:00:00 7 [IU] via SUBCUTANEOUS
  Administered 2018-10-13: 17:00:00 9 [IU] via SUBCUTANEOUS
  Administered 2018-10-13 (×2): 7 [IU] via SUBCUTANEOUS

## 2018-10-12 MED ORDER — INSULIN DETEMIR 100 UNIT/ML ~~LOC~~ SOLN
5.0000 [IU] | Freq: Every day | SUBCUTANEOUS | Status: DC
  Administered 2018-10-12: 5 [IU] via SUBCUTANEOUS
  Filled 2018-10-12: qty 0.05

## 2018-10-12 MED ORDER — SERTRALINE HCL 50 MG PO TABS
50.0000 mg | ORAL_TABLET | Freq: Every day | ORAL | Status: DC
  Administered 2018-10-12 – 2018-11-18 (×37): 50 mg via ORAL
  Filled 2018-10-12 (×38): qty 1

## 2018-10-12 NOTE — Progress Notes (Signed)
PROGRESS NOTE                                                                                                                                                                                                             Patient Demographics:    Charnel Giles, is a 68 y.o. female, DOB - 24-May-1950, GEZ:[id number]  Outpatient Primary MD for the patient is Patient, No Pcp Per    LOS - 2  Admit date - 10/10/2018       Patient only speaks [employer] and Marketing executive used  HPI: 68 year old Spanish-speaking female with past medical history of morbid obesity, hypertension and depression admitted to the hospitalist service on [place] presenting to the emergency room with 1 week of fever, cough and shortness of breath in the setting of known COVID-19 contacts.  Patient found to be COVID positive as well as hypoxic with bilateral infiltrates on chest x-ray.  Requiring oxygen support of high flow.  Started on Remdisivir, steroids and given Actemra x1.  No history of diabetes, but noted to be hyperglycemic.  A1c checked and patient found to have A1c of 8.5.   Subjective:   Patient doing okay.  Still requires 100% high flow oxygen on 40% FiO2.  She says that she is comfortable and breathing okay.  Denies any chest pain, dyspnea   Assessment  & Plan :    Principal Problem:   Acute respiratory failure with hypoxia (HCC) secondary to COVID-19: 1 dose of Actemra.  Continue Remdisivir plus IV steroids and oxygen support.  Despite high need for oxygen, appears comfortable.  CRP improving although ferritin, d-dimer, LDH and platelets worse. Active Problems:   Hypertension: Blood pressure stable.  Daughter provided list of home medications, will restart Cozaar and HCTZ.    Depression: Now that we have gotten list of her home medications, will restart Zoloft    Morbid obesity with BMI of 40.0-44.9, adult [facility]): Patient meets  criteria with BMI greater than 40.    Diabetes mellitus type 2, uncontrolled ([place]): A1c of 8.5.  New diagnosis.  Have started low-dose Levemir and sliding scale NovoLog.  Leukocytosis: Worse today, likely in the setting of steroids  FiO2 (%):  [100 %] 100 %  ABG     Component Value Date/Time   PHART 7.52 (H) 10/10/2018 1647   PCO2ART  34 10/10/2018 1647   PO2ART 53 (L) 10/10/2018 1647   HCO3 27.8 10/10/2018 1647   O2SAT 90.6 10/10/2018 1647    COVID-19 Labs  Recent Labs    10/11/18 0550 10/12/18 0538  DDIMER 0.66* 0.83*  FERRITIN 493* 685*  LDH 264* 270*  CRP 33.4* 31.9*    Lab Results  Component Value Date   SARSCOV2NAA POSITIVE (A) 10/10/2018     Hepatic Function Latest Ref Rng & Units 10/12/2018 10/10/2018  Total Protein 6.5 - 8.1 g/dL 8.9(H) 8.7(H)  Albumin 3.5 - 5.0 g/dL 3.1(L) 3.3(L)  AST 15 - 41 U/L 21 27  ALT 0 - 44 U/L 45(H) 42  Alk Phosphatase 38 - 126 U/L 46 42  Total Bilirubin 0.3 - 1.2 mg/dL 0.2(L) 0.6     No results found for: BNP     Condition : Stable, slowly improving  Family Communication  : Family video conference in during examination  Code Status : Full code  Diet : Add carb modified  Diet Order            Diet Heart Room service appropriate? Yes; Fluid consistency: Thin  Diet effective now               Disposition Plan  : Stable, attempting to decrease below 40% FiO2 on 100% nonrebreather leads to episodes of oxygen desaturation in mid 80s, for now, keep in ICU  Consults  : Critical care  Procedures  : None  PUD Prophylaxis : IV Protonix  DVT Prophylaxis  : Weight-based Lovenox  Lab Results  Component Value Date   PLT 421 (H) 10/12/2018    Inpatient Medications  Scheduled Meds: . enoxaparin (LOVENOX) injection  0.5 mg/kg Subcutaneous Q12H  . insulin aspart  0-5 Units Subcutaneous QHS  . insulin aspart  0-9 Units Subcutaneous TID WC  . insulin detemir  5 Units Subcutaneous QHS  . methylPREDNISolone  (SOLU-MEDROL) injection  80 mg Intravenous Q8H  . sodium chloride flush  3 mL Intravenous Q12H   Continuous Infusions: . sodium chloride Stopped (10/12/18 1042)  . remdesivir 100 mg in NS 250 mL 100 mg (10/12/18 0904)   PRN Meds:.sodium chloride, acetaminophen, ondansetron **OR** ondansetron (ZOFRAN) IV, polyethylene glycol, sodium chloride flush  Antibiotics  :    Anti-infectives (From admission, onward)   Start     Dose/Rate Route Frequency Ordered Stop   10/12/18 0900  remdesivir 100 mg in sodium chloride 0.9 % 230 mL IVPB     100 mg 500 mL/hr over 30 Minutes Intravenous Every 24 hours 10/11/18 0745 10/16/18 0859   10/11/18 0900  remdesivir 200 mg in sodium chloride 0.9 % 210 mL IVPB     200 mg 500 mL/hr over 30 Minutes Intravenous Once 10/11/18 0745 10/11/18 1617       Annita Brod M.D on 10/12/2018 at 4:56 PM  To page go to [email or web address] - password [facility]  [facility] -  Office  [phone]   See all Orders from today for further details    Objective:   Vitals:   10/12/18 1200 10/12/18 1300 10/12/18 1400 10/12/18 1521  BP: 134/63 (!) 130/57 130/63   Pulse: 72 80 80 72  Resp: 18 14 (!) 21 (!) 29  Temp: 98 F (36.7 C)     TempSrc:      SpO2: 93% 90% 93% 93%  Weight:      Height:        Wt Readings from Last 3 Encounters:  10/10/18 108.7  kg  10/10/18 109 kg     Intake/Output Summary (Last 24 hours) at 10/12/2018 1656 Last data filed at 10/12/2018 1500 Gross per 24 hour  Intake 924.52 ml  Output 1450 ml  Net -525.48 ml     Physical Exam  Gen: Alert and oriented x3, no acute distress HEENT: Normocephalic and atraumatic, mucous membranes are moist Neck: Supple, no JVD CV: Regular rate and rhythm, S1-S2 Lungs: Better inspiratory effort, clear to auscultation bilaterally Abd: Soft, nontender, nondistended, positive bowel sounds Extremities: No clubbing cyanosis or edema Neuro: No focal deficits Skin: No skin breaks, tears or lesions  Psychiatry: Appropriate, no evidence of psychoses    Data Review:    CBC Recent Labs  Lab 10/10/18 1631 10/12/18 0538  WBC 14.9* 23.2*  HGB 12.6 12.7  HCT 37.5 39.0  PLT 327 421*  MCV 91.7 93.3  MCH 30.8 30.4  MCHC 33.6 32.6  RDW 14.4 14.4  LYMPHSABS 1.3 1.1  MONOABS 0.8 0.5  EOSABS 0.0 0.1  BASOSABS 0.0 0.0    Chemistries  Recent Labs  Lab 10/10/18 1631 10/12/18 0538  NA 134* 134*  K 3.8 3.6  CL 95* 94*  CO2 26 24  GLUCOSE 171* 269*  BUN 25* 39*  CREATININE 0.99 0.89  CALCIUM 8.7* 8.8*  AST 27 21  ALT 42 45*  ALKPHOS 42 46  BILITOT 0.6 0.2*   ------------------------------------------------------------------------------------------------------------------ No results for input(s): CHOL, HDL, LDLCALC, TRIG, CHOLHDL, LDLDIRECT in the last 72 hours.  Lab Results  Component Value Date   HGBA1C 8.1 (H) 10/11/2018   ------------------------------------------------------------------------------------------------------------------ No results for input(s): TSH, T4TOTAL, T3FREE, THYROIDAB in the last 72 hours.  Invalid input(s): FREET3  Cardiac Enzymes Recent Labs  Lab 10/11/18 0550 10/11/18 1140  TROPONINI <0.03 <0.03   ------------------------------------------------------------------------------------------------------------------ No results found for: BNP  Micro Results Recent Results (from the past 240 hour(s))  Blood Culture (routine x 2)     Status: None (Preliminary result)   Collection Time: 10/10/18  4:31 PM   Specimen: BLOOD  Result Value Ref Range Status   Specimen Description BLOOD LEFT ANTECUBITAL  Final   Special Requests   Final    BOTTLES DRAWN AEROBIC AND ANAEROBIC Blood Culture adequate volume   Culture   Final    NO GROWTH 2 DAYS Performed at [facility], [address] [place], [place] [address]    Report Status PENDING  Incomplete  Blood Culture (routine x 2)     Status: None (Preliminary result)   Collection  Time: 10/10/18  4:31 PM   Specimen: BLOOD  Result Value Ref Range Status   Specimen Description BLOOD RIGHT ANTECUBITAL  Final   Special Requests   Final    BOTTLES DRAWN AEROBIC AND ANAEROBIC Blood Culture results may not be optimal due to an excessive volume of blood received in culture bottles   Culture   Final    NO GROWTH 2 DAYS Performed at [facility], [address], [place], [place] [address]    Report Status PENDING  Incomplete  SARS Coronavirus 2 (CEPHEID- Performed in [place] hospital lab), Hosp Order     Status: Abnormal   Collection Time: 10/10/18  4:47 PM   Specimen: Nasopharyngeal Swab  Result Value Ref Range Status   SARS Coronavirus 2 POSITIVE (A) NEGATIVE Final    Comment: RESULT CALLED TO, READ BACK BY AND VERIFIED WITH: TOM NAGY 10/10/18 @ [place] (NOTE) If result is NEGATIVE SARS-CoV-2 target nucleic acids are NOT  DETECTED. The SARS-CoV-2 RNA is generally detectable in upper and lower  respiratory specimens during the acute phase of infection. The lowest  concentration of SARS-CoV-2 viral copies this assay can detect is 250  copies / mL. A negative result does not preclude SARS-CoV-2 infection  and should not be used as the sole basis for treatment or other  patient management decisions.  A negative result may occur with  improper specimen collection / handling, submission of specimen other  than nasopharyngeal swab, presence of viral mutation(s) within the  areas targeted by this assay, and inadequate number of viral copies  (<250 copies / mL). A negative result must be combined with clinical  observations, patient history, and epidemiological information. If result is POSITIVE SARS-CoV-2 target nucleic acids are DETECTED. The SARS -CoV-2 RNA is generally detectable in upper and lower  respiratory specimens during the acute phase of infection.  Positive  results are indicative of active infection with SARS-CoV-2.  Clinical  correlation with  patient history and other diagnostic information is  necessary to determine patient infection status.  Positive results do  not rule out bacterial infection or co-infection with other viruses. If result is PRESUMPTIVE POSTIVE SARS-CoV-2 nucleic acids MAY BE PRESENT.   A presumptive positive result was obtained on the submitted specimen  and confirmed on repeat testing.  While 2019 novel coronavirus  (SARS-CoV-2) nucleic acids may be present in the submitted sample  additional confirmatory testing may be necessary for epidemiological  and / or clinical management purposes  to differentiate between  SARS-CoV-2 and other Sarbecovirus currently known to infect humans.  If clinically indicated additional testing with an alternate test  methodology [phone]) is advise d. The SARS-CoV-2 RNA is generally  detectable in upper and lower respiratory specimens during the acute  phase of infection. The expected result is Negative. Fact Sheet for Patients:  StrictlyIdeas.no Fact Sheet for Healthcare Providers: BankingDealers.co.za This test is not yet approved or cleared by the [place] FDA and has been authorized for detection and/or diagnosis of SARS-CoV-2 by FDA under an Emergency Use Authorization (EUA).  This EUA will remain in effect (meaning this test can be used) for the duration of the COVID-19 declaration under Section 564(b)(1) of the Act, 21 U.S.C. section 360bbb-3(b)(1), unless the authorization is terminated or revoked sooner. Performed at [facility], [address], [place], [place] [address]   Urine culture     Status: None   Collection Time: 10/10/18  8:39 PM   Specimen: Urine, Random  Result Value Ref Range Status   Specimen Description   Final    URINE, RANDOM Performed at [facility], [address], [place], [place] [address]    Special Requests   Final    Normal Performed at [facility], [address] [place], [place] [address]    Culture   Final    Multiple bacterial morphotypes present, none predominant. Suggest appropriate recollection if clinically indicated.   Report Status 10/12/2018 FINAL  Final    Radiology Reports Dg Chest Port 1 View  Result Date: 10/10/2018 CLINICAL DATA:  Fever and shortness of breath EXAM: PORTABLE CHEST 1 VIEW COMPARISON:  None. FINDINGS: There are bilateral streaky airspace opacities, worst in the right upper lobe. Cardiomediastinal contours are normal. No pneumothorax or sizable pleural effusion. IMPRESSION: Bilateral streaky airspace opacities which may indicate multifocal infection. Electronically Signed   By: Ulyses Jarred M.D.   On: 10/10/2018 17:30

## 2018-10-12 NOTE — Progress Notes (Signed)
NAME:  Christina Clark, MRN:  [id number], DOB:  16-Aug-1950, LOS: 2 ADMISSION DATE:  10/10/2018, CONSULTATION DATE:  10/11/2018 REFERRING MD:  Mathews Robinsons MD, CHIEF COMPLAINT: COVID 32 pneumonia  Brief History   68 year old with hypertension, morbid obesity, depression admitted with COVID-19 pneumonia, acute respiratory failure requiring high flow nasal cannula.  Past Medical History  Hypertension, morbid obesity, depression  Significant Hospital Events   6/11- Admit  Consults:  PCCM  Procedures:    Significant Diagnostic Tests:    Micro Data:    Antimicrobials/COVID therapy  Remdesivir 6/11 > Steroids 6/11 > Actemra x1  Interim history/subjective:  No complaints overnight Remains on high flow nasal cannula.  Was able to come off the nonrebreather mask  Objective   Blood pressure (!) 123/53, pulse 68, temperature 98.5 F (36.9 C), temperature source Axillary, resp. rate (!) 29, height 5\' 4"  (1.626 m), weight 108.7 kg, SpO2 97 %.    FiO2 (%):  [100 %] 100 %   Intake/Output Summary (Last 24 hours) at 10/12/2018 0848 Last data filed at 10/12/2018 0600 Gross per 24 hour  Intake 406.51 ml  Output 1050 ml  Net -643.49 ml   Filed Weights   10/10/18 2300  Weight: 108.7 kg    Examination:. Gen:      No acute distress HEENT:  EOMI, sclera anicteric Neck:     No masses; no thyromegaly Lungs:    Clear to auscultation bilaterally; normal respiratory effort CV:         Regular rate and rhythm; no murmurs Abd:      + bowel sounds; soft, non-tender; no palpable masses, no distension Ext:    No edema; adequate peripheral perfusion Skin:      Warm and dry; no rash Neuro: alert and oriented x 3 Psych: normal mood and affect  Resolved Hospital Problem list     Assessment & Plan:  68 year old with COVID-19 pneumonia  Severe respiratory failure Stable respiratory status Continue high flow nasal cannula.  Wean down FiO2 as tolerated Encourage awake proning although  she prefers to lie on her side down her stomach Monitoring in ICU  COVID-19 pneumonia Started on steroids, Remicade, Actemra Discussed convalescent plasma with patient.  She does not want to enroll in the study.  Best practice:  Diet: PO diet Pain/Anxiety/Delirium protocol (if indicated): NA VAP protocol (if indicated): NA DVT prophylaxis: Lovenox GI prophylaxis: NA Glucose control: Monitor sugars Mobility: Bed Code Status: Full Family Communication: per TRH Disposition: ICU  Labs   CBC: Recent Labs  Lab 10/10/18 1631 10/12/18 0538  WBC 14.9* 23.2*  NEUTROABS 12.7* 21.4*  HGB 12.6 12.7  HCT 37.5 39.0  MCV 91.7 93.3  PLT 327 421*    Basic Metabolic Panel: Recent Labs  Lab 10/10/18 1631 10/12/18 0538  NA 134* 134*  K 3.8 3.6  CL 95* 94*  CO2 26 24  GLUCOSE 171* 269*  BUN 25* 39*  CREATININE 0.99 0.89  CALCIUM 8.7* 8.8*   GFR: Estimated Creatinine Clearance: 74.9 mL/min (by C-G formula based on SCr of 0.89 mg/dL). Recent Labs  Lab 10/10/18 1631 10/11/18 0550 10/12/18 0538  PROCALCITON  --  0.10  --   WBC 14.9*  --  23.2*  LATICACIDVEN 1.2  --   --     Liver Function Tests: Recent Labs  Lab 10/10/18 1631 10/12/18 0538  AST 27 21  ALT 42 45*  ALKPHOS 42 46  BILITOT 0.6 0.2*  PROT 8.7* 8.9*  ALBUMIN 3.3* 3.1*  No results for input(s): LIPASE, AMYLASE in the last 168 hours. No results for input(s): AMMONIA in the last 168 hours.  ABG    Component Value Date/Time   PHART 7.52 (H) 10/10/2018 1647   PCO2ART 34 10/10/2018 1647   PO2ART 53 (L) 10/10/2018 1647   HCO3 27.8 10/10/2018 1647   O2SAT 90.6 10/10/2018 1647     Coagulation Profile: No results for input(s): INR, PROTIME in the last 168 hours.  Cardiac Enzymes: Recent Labs  Lab 10/11/18 0550 10/11/18 1140  TROPONINI <0.03 <0.03    HbA1C: Hgb A1c MFr Bld  Date/Time Value Ref Range Status  10/11/2018 05:50 AM 8.1 (H) 4.8 - 5.6 % Final    Comment:    (NOTE) Pre diabetes:           5.7%-6.4% Diabetes:              >6.4% Glycemic control for   <7.0% adults with diabetes     CBG: No results for input(s): GLUCAP in the last 168 hours.  The patient is critically ill with multiple organ system failure and requires high complexity decision making for assessment and support, frequent evaluation and titration of therapies, advanced monitoring, review of radiographic studies and interpretation of complex data.   Critical Care Time devoted to patient care services, exclusive of separately billable procedures, described in this note is 35 minutes.   Chilton GreathousePraveen Andrienne Havener MD [place] [facility] Pager [phone] If no answer call [phone] 10/12/2018, 8:49 AM

## 2018-10-12 NOTE — Progress Notes (Signed)
Pt currently on FaceTime with her family.

## 2018-10-12 NOTE — Progress Notes (Signed)
Assisted pt & family with video chat via elink.  

## 2018-10-12 NOTE — Progress Notes (Signed)
Noted that HgbA1C is 8.1%.  Recommend checking CBGs TID & HS while in the hospital. May need to cover blood sugars with Novolog correction scale TID & HS.  Note that HgbA1C of 6.5% per American Diabetes Association is diagnosis of diabetes.   Harvel Ricks RN BSN CDE Diabetes Coordinator Pager: [phone]  8am-5pm

## 2018-10-13 DIAGNOSIS — U071 COVID-19: Principal | ICD-10-CM

## 2018-10-13 DIAGNOSIS — J9601 Acute respiratory failure with hypoxia: Secondary | ICD-10-CM

## 2018-10-13 LAB — COMPREHENSIVE METABOLIC PANEL
ALT: 42 U/L (ref 0–44)
AST: 17 U/L (ref 15–41)
Albumin: 3.2 g/dL — ABNORMAL LOW (ref 3.5–5.0)
Alkaline Phosphatase: 49 U/L (ref 38–126)
Anion gap: 13 (ref 5–15)
BUN: 57 mg/dL — ABNORMAL HIGH (ref 8–23)
CO2: 25 mmol/L (ref 22–32)
Calcium: 9.2 mg/dL (ref 8.9–10.3)
Chloride: 98 mmol/L (ref 98–111)
Creatinine, Ser: 0.95 mg/dL (ref 0.44–1.00)
GFR calc Af Amer: >60 mL/min (ref >60)
GFR calc non Af Amer: >60 mL/min (ref >60)
Glucose, Bld: 282 mg/dL — ABNORMAL HIGH (ref 70–99)
Potassium: 3.7 mmol/L (ref 3.5–5.1)
Sodium: 136 mmol/L (ref 135–145)
Total Bilirubin: 0.5 mg/dL (ref 0.3–1.2)
Total Protein: 8.7 g/dL — ABNORMAL HIGH (ref 6.5–8.1)

## 2018-10-13 LAB — GLUCOSE, CAPILLARY
Glucose-Capillary: 307 mg/dL — ABNORMAL HIGH (ref 70–99)
Glucose-Capillary: 272 mg/dL — ABNORMAL HIGH (ref 70–99)

## 2018-10-13 MED ORDER — INSULIN DETEMIR 100 UNIT/ML ~~LOC~~ SOLN
5.0000 [IU] | SUBCUTANEOUS | Status: AC
  Administered 2018-10-13: 5 [IU] via SUBCUTANEOUS
  Filled 2018-10-13: qty 0.05

## 2018-10-13 MED ORDER — CHLORHEXIDINE GLUCONATE CLOTH 2 % EX PADS
6.0000 | MEDICATED_PAD | Freq: Every day | CUTANEOUS | Status: DC
  Administered 2018-10-14 – 2018-10-22 (×9): 6 via TOPICAL

## 2018-10-13 MED ORDER — INSULIN DETEMIR 100 UNIT/ML ~~LOC~~ SOLN
10.0000 [IU] | Freq: Every day | SUBCUTANEOUS | Status: DC
  Administered 2018-10-13: 21:00:00 10 [IU] via SUBCUTANEOUS
  Filled 2018-10-13: qty 0.1

## 2018-10-13 NOTE — Progress Notes (Signed)
PROGRESS NOTE                                                                                                                                                                                                             Patient Demographics:    Christina Clark, is a 68 y.o. female, DOB - May 06, 1950, VZC:[id number]  Outpatient Primary MD for the patient is Patient, No Pcp Per    LOS - 3  Admit date - 10/10/2018       Patient only speaks Spanish and Marketing executive used  HPI: 69 year old Spanish-speaking female with past medical history of morbid obesity, hypertension and depression admitted to the hospitalist service on [place] presenting to the emergency room with 1 week of fever, cough and shortness of breath in the setting of known COVID-19 contacts.  Patient found to be COVID positive as well as hypoxic with bilateral infiltrates on chest x-ray.  Requiring oxygen support of high flow.  Started on Remdisivir, steroids and given Actemra x1.  No history of diabetes, but noted to be hyperglycemic.  A1c checked and patient found to have A1c of 8.5, and started on low-dose Levemir plus sliding scale.  Overnight no issues, patient's FiO2 decreased from 40% down to 30%.  Currently attempting 25%.  Patient herself has no complaints and feels like her breathing is getting better.    Assessment  & Plan :    Principal Problem:   Acute respiratory failure with hypoxia (HCC) secondary to COVID-19: 1 dose of Actemra.  Continue Remdisivir plus IV steroids and oxygen support.  Despite high need for oxygen, appears comfortable.  Titrating down FiO2 to 25% and see how she tolerates.  She will likely be able to transfer to the floor later this afternoon. Active Problems:   Hypertension: Blood pressure stable.  Continue home Cozaar and HCTZ.    Depression: Continue home Zoloft    Morbid obesity with BMI of 40.0-44.9, adult  [facility]): Patient meets criteria with BMI greater than 40.    Diabetes mellitus type 2, uncontrolled ([place]): A1c of 8.5.  Discussed this diagnosis with the patient who told me actually that she has been told in the past that she does have diabetes.  She is not on any medications though.  Explained to her that while she is in the hospital start her  on insulin, which she is amenable to and she is also amenable to being on a pill after she is discharged.  Increase Levemir from 5-10 given persistently elevated blood sugars Leukocytosis: Worse today, likely in the setting of steroids  FiO2 (%):  [100 %] 100 %  ABG     Component Value Date/Time   PHART 7.52 (H) 10/10/2018 1647   PCO2ART 34 10/10/2018 1647   PO2ART 53 (L) 10/10/2018 1647   HCO3 27.8 10/10/2018 1647   O2SAT 90.6 10/10/2018 1647    COVID-19 Labs  Recent Labs    10/11/18 0550 10/12/18 0538  DDIMER 0.66* 0.83*  FERRITIN 493* 685*  LDH 264* 270*  CRP 33.4* 31.9*    Lab Results  Component Value Date   SARSCOV2NAA POSITIVE (A) 10/10/2018     Hepatic Function Latest Ref Rng & Units 10/13/2018 10/12/2018 10/10/2018  Total Protein 6.5 - 8.1 g/dL 8.7(H) 8.9(H) 8.7(H)  Albumin 3.5 - 5.0 g/dL 3.2(L) 3.1(L) 3.3(L)  AST 15 - 41 U/L '17 21 27  '$ ALT 0 - 44 U/L 42 45(H) 42  Alk Phosphatase 38 - 126 U/L 49 46 42  Total Bilirubin 0.3 - 1.2 mg/dL 0.5 0.2(L) 0.6     No results found for: BNP     Condition : Stable, slowly improving  Family Communication  : Spoke with daughter by phone  Code Status : Full code  Diet : Carb modified low-sodium  Diet Order            Diet Heart Room service appropriate? Yes; Fluid consistency: Thin  Diet effective now               Disposition Plan  : Stable, continue to try to decrease FiO2.  Likely transfer to floor later this afternoon  Consults  : Critical care, signed off 6/13  Procedures  : None  PUD Prophylaxis : IV Protonix  DVT Prophylaxis  : Weight-based Lovenox  Lab  Results  Component Value Date   PLT 421 (H) 10/12/2018    Inpatient Medications  Scheduled Meds: . enoxaparin (LOVENOX) injection  0.5 mg/kg Subcutaneous Q12H  . hydrochlorothiazide  25 mg Oral Daily  . insulin aspart  0-5 Units Subcutaneous QHS  . insulin aspart  0-9 Units Subcutaneous TID WC  . insulin detemir  10 Units Subcutaneous QHS  . losartan  50 mg Oral Daily  . methylPREDNISolone (SOLU-MEDROL) injection  80 mg Intravenous Q8H  . sertraline  50 mg Oral Daily  . sodium chloride flush  3 mL Intravenous Q12H   Continuous Infusions: . sodium chloride Stopped (10/12/18 1042)  . remdesivir 100 mg in NS 250 mL 100 mg (10/13/18 0916)   PRN Meds:.sodium chloride, acetaminophen, ondansetron **OR** ondansetron (ZOFRAN) IV, polyethylene glycol, sodium chloride flush  Antibiotics  :    Anti-infectives (From admission, onward)   Start     Dose/Rate Route Frequency Ordered Stop   10/12/18 0900  remdesivir 100 mg in sodium chloride 0.9 % 230 mL IVPB     100 mg 500 mL/hr over 30 Minutes Intravenous Every 24 hours 10/11/18 0745 10/16/18 0859   10/11/18 0900  remdesivir 200 mg in sodium chloride 0.9 % 210 mL IVPB     200 mg 500 mL/hr over 30 Minutes Intravenous Once 10/11/18 0745 10/11/18 1617       Annita Brod M.D on 10/13/2018 at 10:51 AM  To page go to [email or web address] - password [facility]  [facility] -  Office  [phone]  See all Orders from today for further details    Objective:   Vitals:   10/13/18 0749 10/13/18 0800 10/13/18 0900 10/13/18 1000  BP: (!) 150/95 133/70 (!) 148/124 (!) 144/61  Pulse: 84 93 80 87  Resp: (!) 25 (!) 37 (!) 34 (!) 28  Temp:  98.6 F (37 C)    TempSrc:  Oral    SpO2: 95% (!) 82% 92% (!) 89%  Weight:      Height:        Wt Readings from Last 3 Encounters:  10/10/18 108.7 kg  10/10/18 109 kg     Intake/Output Summary (Last 24 hours) at 10/13/2018 1051 Last data filed at 10/13/2018 0916 Gross per 24 hour  Intake  1220.56 ml  Output 850 ml  Net 370.56 ml     Physical Exam  Gen: Alert and oriented x3, no acute distress HEENT: Normocephalic and atraumatic, mucous membranes are moist Neck: Supple, no JVD CV: Regular rate and rhythm, S1-S2 Lungs: Better inspiratory effort, clear to auscultation bilaterally Abd: Soft, nontender, nondistended, positive bowel sounds Extremities: No clubbing cyanosis or edema Neuro: No focal deficits Skin: No skin breaks, tears or lesions Psychiatry: Appropriate, no evidence of psychoses    Data Review:    CBC Recent Labs  Lab 10/10/18 1631 10/12/18 0538  WBC 14.9* 23.2*  HGB 12.6 12.7  HCT 37.5 39.0  PLT 327 421*  MCV 91.7 93.3  MCH 30.8 30.4  MCHC 33.6 32.6  RDW 14.4 14.4  LYMPHSABS 1.3 1.1  MONOABS 0.8 0.5  EOSABS 0.0 0.1  BASOSABS 0.0 0.0    Chemistries  Recent Labs  Lab 10/10/18 1631 10/12/18 0538 10/13/18 0550  NA 134* 134* 136  K 3.8 3.6 3.7  CL 95* 94* 98  CO2 '26 24 25  '$ GLUCOSE 171* 269* 282*  BUN 25* 39* 57*  CREATININE 0.99 0.89 0.95  CALCIUM 8.7* 8.8* 9.2  AST '27 21 17  '$ ALT 42 45* 42  ALKPHOS 42 46 49  BILITOT 0.6 0.2* 0.5   ------------------------------------------------------------------------------------------------------------------ No results for input(s): CHOL, HDL, LDLCALC, TRIG, CHOLHDL, LDLDIRECT in the last 72 hours.  Lab Results  Component Value Date   HGBA1C 8.1 (H) 10/11/2018   ------------------------------------------------------------------------------------------------------------------ No results for input(s): TSH, T4TOTAL, T3FREE, THYROIDAB in the last 72 hours.  Invalid input(s): FREET3  Cardiac Enzymes Recent Labs  Lab 10/11/18 0550 10/11/18 1140  TROPONINI <0.03 <0.03   ------------------------------------------------------------------------------------------------------------------ No results found for: BNP  Micro Results Recent Results (from the past 240 hour(s))  Blood Culture  (routine x 2)     Status: None (Preliminary result)   Collection Time: 10/10/18  4:31 PM   Specimen: BLOOD  Result Value Ref Range Status   Specimen Description BLOOD LEFT ANTECUBITAL  Final   Special Requests   Final    BOTTLES DRAWN AEROBIC AND ANAEROBIC Blood Culture adequate volume   Culture   Final    NO GROWTH 3 DAYS Performed at [facility], [address] [place], [place] [address]    Report Status PENDING  Incomplete  Blood Culture (routine x 2)     Status: None (Preliminary result)   Collection Time: 10/10/18  4:31 PM   Specimen: BLOOD  Result Value Ref Range Status   Specimen Description BLOOD RIGHT ANTECUBITAL  Final   Special Requests   Final    BOTTLES DRAWN AEROBIC AND ANAEROBIC Blood Culture results may not be optimal due to an excessive volume of blood received in culture bottles  Culture   Final    NO GROWTH 3 DAYS Performed at [facility], [address] [place], [place] [address]    Report Status PENDING  Incomplete  SARS Coronavirus 2 (CEPHEID- Performed in [place] hospital lab), Hosp Order     Status: Abnormal   Collection Time: 10/10/18  4:47 PM   Specimen: Nasopharyngeal Swab  Result Value Ref Range Status   SARS Coronavirus 2 POSITIVE (A) NEGATIVE Final    Comment: RESULT CALLED TO, READ BACK BY AND VERIFIED WITH: TOM NAGY 10/10/18 @ 9798  [place] (NOTE) If result is NEGATIVE SARS-CoV-2 target nucleic acids are NOT DETECTED. The SARS-CoV-2 RNA is generally detectable in upper and lower  respiratory specimens during the acute phase of infection. The lowest  concentration of SARS-CoV-2 viral copies this assay can detect is 250  copies / mL. A negative result does not preclude SARS-CoV-2 infection  and should not be used as the sole basis for treatment or other  patient management decisions.  A negative result may occur with  improper specimen collection / handling, submission of specimen other  than nasopharyngeal swab, presence  of viral mutation(s) within the  areas targeted by this assay, and inadequate number of viral copies  (<250 copies / mL). A negative result must be combined with clinical  observations, patient history, and epidemiological information. If result is POSITIVE SARS-CoV-2 target nucleic acids are DETECTED. The SARS -CoV-2 RNA is generally detectable in upper and lower  respiratory specimens during the acute phase of infection.  Positive  results are indicative of active infection with SARS-CoV-2.  Clinical  correlation with patient history and other diagnostic information is  necessary to determine patient infection status.  Positive results do  not rule out bacterial infection or co-infection with other viruses. If result is PRESUMPTIVE POSTIVE SARS-CoV-2 nucleic acids MAY BE PRESENT.   A presumptive positive result was obtained on the submitted specimen  and confirmed on repeat testing.  While 2019 novel coronavirus  (SARS-CoV-2) nucleic acids may be present in the submitted sample  additional confirmatory testing may be necessary for epidemiological  and / or clinical management purposes  to differentiate between  SARS-CoV-2 and other Sarbecovirus currently known to infect humans.  If clinically indicated additional testing with an alternate test  methodology [phone]) is advise d. The SARS-CoV-2 RNA is generally  detectable in upper and lower respiratory specimens during the acute  phase of infection. The expected result is Negative. Fact Sheet for Patients:  StrictlyIdeas.no Fact Sheet for Healthcare Providers: BankingDealers.co.za This test is not yet approved or cleared by the [place] FDA and has been authorized for detection and/or diagnosis of SARS-CoV-2 by FDA under an Emergency Use Authorization (EUA).  This EUA will remain in effect (meaning this test can be used) for the duration of the COVID-19 declaration under Section  564(b)(1) of the Act, 21 U.S.C. section 360bbb-3(b)(1), unless the authorization is terminated or revoked sooner. Performed at [facility], [address], [place], [place] [address]   Urine culture     Status: None   Collection Time: 10/10/18  8:39 PM   Specimen: Urine, Random  Result Value Ref Range Status   Specimen Description   Final    URINE, RANDOM Performed at [facility], [address], [place], [place] [address]    Special Requests   Final    Normal Performed at [facility], [address], [place], [place] [address]    Culture  Final    Multiple bacterial morphotypes present, none predominant. Suggest appropriate recollection if clinically indicated.   Report Status 10/12/2018 FINAL  Final    Radiology Reports Dg Chest Port 1 View  Result Date: 10/10/2018 CLINICAL DATA:  Fever and shortness of breath EXAM: PORTABLE CHEST 1 VIEW COMPARISON:  None. FINDINGS: There are bilateral streaky airspace opacities, worst in the right upper lobe. Cardiomediastinal contours are normal. No pneumothorax or sizable pleural effusion. IMPRESSION: Bilateral streaky airspace opacities which may indicate multifocal infection. Electronically Signed   By: Ulyses Jarred M.D.   On: 10/10/2018 17:30

## 2018-10-13 NOTE — Progress Notes (Signed)
Updated family on plan of care and patient is currently communicating with family via facetime. Will continue to monitor.

## 2018-10-13 NOTE — Plan of Care (Signed)

## 2018-10-13 NOTE — Progress Notes (Signed)
NAME:  Christina Clark, MRN:  [id number], DOB:  1951-03-19, LOS: 3 ADMISSION DATE:  10/10/2018, CONSULTATION DATE:  10/11/2018 REFERRING MD:  Mathews Robinsons MD, CHIEF COMPLAINT: COVID 52 pneumonia  Brief History   68 year old with hypertension, morbid obesity, depression admitted with COVID-19 pneumonia, acute respiratory failure requiring high flow nasal cannula.  Past Medical History  Hypertension, morbid obesity, depression  Significant Hospital Events   6/11- Admit  Consults:  PCCM  Procedures:    Significant Diagnostic Tests:    Micro Data:    Antimicrobials/COVID therapy  Remdesivir 6/11 > Steroids 6/11 > Actemra x1  Interim history/subjective:  No issues overnight HFNC is weaning down. Now at 25 lt Comfortable with no complaints  Objective   Blood pressure (!) 144/61, pulse 87, temperature 98.6 F (37 C), temperature source Oral, resp. rate (!) 28, height 5\' 4"  (1.626 m), weight 108.7 kg, SpO2 (!) 89 %.    FiO2 (%):  [100 %] 100 %   Intake/Output Summary (Last 24 hours) at 10/13/2018 1053 Last data filed at 10/13/2018 0916 Gross per 24 hour  Intake 1220.56 ml  Output 850 ml  Net 370.56 ml   Filed Weights   10/10/18 2300  Weight: 108.7 kg    Examination:.. Gen:      No acute distress HEENT:  EOMI, sclera anicteric Neck:     No masses; no thyromegaly Lungs:    Clear to auscultation bilaterally; normal respiratory effort CV:         Regular rate and rhythm; no murmurs Abd:      + bowel sounds; soft, non-tender; no palpable masses, no distension Ext:    No edema; adequate peripheral perfusion Skin:      Warm and dry; no rash Neuro: alert and oriented x 3 Psych: normal mood and affect  Resolved Hospital Problem list     Assessment & Plan:  68 year old with COVID-19 pneumonia  Severe respiratory failure Stable respiratory status Continue high flow nasal cannula.  Wean down FiO2 as tolerated  COVID-19 pneumonia Getting steroids, Remicade,  Actemra Discussed convalescent plasma with patient.  She does not want to enroll in the study.  Noted plans to transfer to PCU today. PCCM will be available as needed.  Best practice:  Diet: PO diet Pain/Anxiety/Delirium protocol (if indicated): NA VAP protocol (if indicated): NA DVT prophylaxis: Lovenox GI prophylaxis: NA Glucose control: Monitor sugars Mobility: Bed Code Status: Full Family Communication: per TRH Disposition: ICU  Labs   CBC: Recent Labs  Lab 10/10/18 1631 10/12/18 0538  WBC 14.9* 23.2*  NEUTROABS 12.7* 21.4*  HGB 12.6 12.7  HCT 37.5 39.0  MCV 91.7 93.3  PLT 327 421*    Basic Metabolic Panel: Recent Labs  Lab 10/10/18 1631 10/12/18 0538 10/13/18 0550  NA 134* 134* 136  K 3.8 3.6 3.7  CL 95* 94* 98  CO2 26 24 25   GLUCOSE 171* 269* 282*  BUN 25* 39* 57*  CREATININE 0.99 0.89 0.95  CALCIUM 8.7* 8.8* 9.2   GFR: Estimated Creatinine Clearance: 70.2 mL/min (by C-G formula based on SCr of 0.95 mg/dL). Recent Labs  Lab 10/10/18 1631 10/11/18 0550 10/12/18 0538  PROCALCITON  --  0.10  --   WBC 14.9*  --  23.2*  LATICACIDVEN 1.2  --   --     Liver Function Tests: Recent Labs  Lab 10/10/18 1631 10/12/18 0538 10/13/18 0550  AST 27 21 17   ALT 42 45* 42  ALKPHOS 42 46 49  BILITOT 0.6  0.2* 0.5  PROT 8.7* 8.9* 8.7*  ALBUMIN 3.3* 3.1* 3.2*   No results for input(s): LIPASE, AMYLASE in the last 168 hours. No results for input(s): AMMONIA in the last 168 hours.  ABG    Component Value Date/Time   PHART 7.52 (H) 10/10/2018 1647   PCO2ART 34 10/10/2018 1647   PO2ART 53 (L) 10/10/2018 1647   HCO3 27.8 10/10/2018 1647   O2SAT 90.6 10/10/2018 1647     Coagulation Profile: No results for input(s): INR, PROTIME in the last 168 hours.  Cardiac Enzymes: Recent Labs  Lab 10/11/18 0550 10/11/18 1140  TROPONINI <0.03 <0.03    HbA1C: Hgb A1c MFr Bld  Date/Time Value Ref Range Status  10/11/2018 05:50 AM 8.1 (H) 4.8 - 5.6 % Final     Comment:    (NOTE) Pre diabetes:          5.7%-6.4% Diabetes:              >6.4% Glycemic control for   <7.0% adults with diabetes     CBG: Recent Labs  Lab 10/12/18 1711 10/12/18 1958 10/12/18 2148 10/13/18 0714  GLUCAP 303* 322* 291* 307*    Zyrah Wiswell MD [facility] Pager [phone] If no answer call [phone] 10/13/2018, 10:53 AM

## 2018-10-13 NOTE — Progress Notes (Signed)
Patient was unable to maintain oxygenation on 15L. Patient is not visually under distress and vital signs other than Pulse ox are stable. Respiratory increase until able to maintain above 80%. Currently on 40L/ 40%. Treatment team notified. Patient informed via translator. Answered all questions and verbalized understanding. Transfer on hold.

## 2018-10-13 NOTE — Progress Notes (Signed)
RT used translator on ipad to tell pt this RT decreased the flow on HFNC and that she is doing better but still not safe O2 levels to come off Heated HFNC. Pt understood and thankful for explanation

## 2018-10-14 LAB — COMPREHENSIVE METABOLIC PANEL
ALT: 41 U/L (ref 0–44)
AST: 16 U/L (ref 15–41)
Albumin: 3.3 g/dL — ABNORMAL LOW (ref 3.5–5.0)
Alkaline Phosphatase: 48 U/L (ref 38–126)
Anion gap: 15 (ref 5–15)
BUN: 56 mg/dL — ABNORMAL HIGH (ref 8–23)
CO2: 26 mmol/L (ref 22–32)
Calcium: 9.4 mg/dL (ref 8.9–10.3)
Chloride: 95 mmol/L — ABNORMAL LOW (ref 98–111)
Creatinine, Ser: 1.08 mg/dL — ABNORMAL HIGH (ref 0.44–1.00)
GFR calc Af Amer: >60 mL/min (ref >60)
GFR calc non Af Amer: 53 mL/min — ABNORMAL LOW (ref >60)
Glucose, Bld: 284 mg/dL — ABNORMAL HIGH (ref 70–99)
Potassium: 3.8 mmol/L (ref 3.5–5.1)
Sodium: 136 mmol/L (ref 135–145)
Total Bilirubin: 0.4 mg/dL (ref 0.3–1.2)
Total Protein: 8.9 g/dL — ABNORMAL HIGH (ref 6.5–8.1)

## 2018-10-14 LAB — GLUCOSE, CAPILLARY
Glucose-Capillary: 293 mg/dL — ABNORMAL HIGH (ref 70–99)
Glucose-Capillary: 344 mg/dL — ABNORMAL HIGH (ref 70–99)
Glucose-Capillary: 262 mg/dL — ABNORMAL HIGH (ref 70–99)
Glucose-Capillary: 270 mg/dL — ABNORMAL HIGH (ref 70–99)

## 2018-10-14 MED ORDER — INSULIN DETEMIR 100 UNIT/ML ~~LOC~~ SOLN
5.0000 [IU] | SUBCUTANEOUS | Status: AC
  Administered 2018-10-14: 5 [IU] via SUBCUTANEOUS
  Filled 2018-10-14: qty 0.05

## 2018-10-14 MED ORDER — INSULIN ASPART 100 UNIT/ML ~~LOC~~ SOLN
0.0000 [IU] | Freq: Every day | SUBCUTANEOUS | Status: DC
  Administered 2018-10-14: 21:00:00 3 [IU] via SUBCUTANEOUS
  Administered 2018-10-15 – 2018-10-29 (×4): 2 [IU] via SUBCUTANEOUS

## 2018-10-14 MED ORDER — ALPRAZOLAM 0.5 MG PO TABS
0.2500 mg | ORAL_TABLET | Freq: Every day | ORAL | Status: DC
  Administered 2018-10-14 – 2018-10-29 (×15): 0.25 mg via ORAL
  Filled 2018-10-14 (×15): qty 1

## 2018-10-14 MED ORDER — METHYLPREDNISOLONE SODIUM SUCC 125 MG IJ SOLR
60.0000 mg | Freq: Three times a day (TID) | INTRAMUSCULAR | Status: DC
  Administered 2018-10-14 – 2018-10-16 (×6): 60 mg via INTRAVENOUS
  Filled 2018-10-14 (×6): qty 2

## 2018-10-14 MED ORDER — INSULIN ASPART 100 UNIT/ML ~~LOC~~ SOLN
0.0000 [IU] | Freq: Three times a day (TID) | SUBCUTANEOUS | Status: DC
  Administered 2018-10-14 (×2): 11 [IU] via SUBCUTANEOUS
  Administered 2018-10-14: 15 [IU] via SUBCUTANEOUS
  Administered 2018-10-15 (×2): 7 [IU] via SUBCUTANEOUS
  Administered 2018-10-15 – 2018-10-16 (×2): 4 [IU] via SUBCUTANEOUS
  Administered 2018-10-16 – 2018-10-17 (×4): 7 [IU] via SUBCUTANEOUS
  Administered 2018-10-17: 13:00:00 4 [IU] via SUBCUTANEOUS
  Administered 2018-10-18: 13:00:00 7 [IU] via SUBCUTANEOUS
  Administered 2018-10-18: 08:00:00 4 [IU] via SUBCUTANEOUS
  Administered 2018-10-19: 13:00:00 7 [IU] via SUBCUTANEOUS
  Administered 2018-10-19 – 2018-10-20 (×2): 3 [IU] via SUBCUTANEOUS
  Administered 2018-10-20 (×2): 4 [IU] via SUBCUTANEOUS
  Administered 2018-10-21 (×2): 3 [IU] via SUBCUTANEOUS
  Administered 2018-10-22: 17:00:00 4 [IU] via SUBCUTANEOUS
  Administered 2018-10-22: 7 [IU] via SUBCUTANEOUS
  Administered 2018-10-23: 17:00:00 4 [IU] via SUBCUTANEOUS
  Administered 2018-10-23 – 2018-10-24 (×2): 11 [IU] via SUBCUTANEOUS
  Administered 2018-10-24 – 2018-10-25 (×3): 3 [IU] via SUBCUTANEOUS
  Administered 2018-10-25: 13:00:00 11 [IU] via SUBCUTANEOUS
  Administered 2018-10-26: 4 [IU] via SUBCUTANEOUS
  Administered 2018-10-26 – 2018-10-27 (×2): 3 [IU] via SUBCUTANEOUS
  Administered 2018-10-27: 17:00:00 7 [IU] via SUBCUTANEOUS
  Administered 2018-10-27: 12:00:00 4 [IU] via SUBCUTANEOUS
  Administered 2018-10-28: 7 [IU] via SUBCUTANEOUS
  Administered 2018-10-28: 08:00:00 3 [IU] via SUBCUTANEOUS
  Administered 2018-10-28: 11 [IU] via SUBCUTANEOUS
  Administered 2018-10-29 (×3): 4 [IU] via SUBCUTANEOUS
  Administered 2018-10-30: 11 [IU] via SUBCUTANEOUS
  Administered 2018-10-30: 15 [IU] via SUBCUTANEOUS
  Administered 2018-10-31: 11 [IU] via SUBCUTANEOUS
  Administered 2018-10-31: 15 [IU] via SUBCUTANEOUS
  Administered 2018-11-01: 7 [IU] via SUBCUTANEOUS
  Administered 2018-11-01: 17:00:00 11 [IU] via SUBCUTANEOUS
  Administered 2018-11-02: 7 [IU] via SUBCUTANEOUS
  Administered 2018-11-02: 12:00:00 15 [IU] via SUBCUTANEOUS
  Administered 2018-11-03: 3 [IU] via SUBCUTANEOUS
  Administered 2018-11-03 (×2): 11 [IU] via SUBCUTANEOUS
  Administered 2018-11-04: 7 [IU] via SUBCUTANEOUS
  Administered 2018-11-04: 09:00:00 3 [IU] via SUBCUTANEOUS
  Administered 2018-11-04 – 2018-11-05 (×2): 11 [IU] via SUBCUTANEOUS
  Administered 2018-11-05: 15 [IU] via SUBCUTANEOUS
  Administered 2018-11-06: 7 [IU] via SUBCUTANEOUS
  Administered 2018-11-06: 11 [IU] via SUBCUTANEOUS
  Administered 2018-11-07: 15 [IU] via SUBCUTANEOUS
  Administered 2018-11-07: 7 [IU] via SUBCUTANEOUS
  Administered 2018-11-08 (×2): 15 [IU] via SUBCUTANEOUS
  Administered 2018-11-09 (×2): 4 [IU] via SUBCUTANEOUS
  Administered 2018-11-10: 11 [IU] via SUBCUTANEOUS
  Administered 2018-11-10: 3 [IU] via SUBCUTANEOUS
  Administered 2018-11-11 – 2018-11-13 (×6): 7 [IU] via SUBCUTANEOUS
  Administered 2018-11-14: 3 [IU] via SUBCUTANEOUS
  Administered 2018-11-14: 7 [IU] via SUBCUTANEOUS

## 2018-10-14 MED ORDER — INSULIN DETEMIR 100 UNIT/ML ~~LOC~~ SOLN
15.0000 [IU] | Freq: Every day | SUBCUTANEOUS | Status: DC
  Administered 2018-10-14: 21:00:00 15 [IU] via SUBCUTANEOUS
  Filled 2018-10-14: qty 0.15

## 2018-10-14 MED ORDER — SODIUM BICARBONATE 8.4 % IV SOLN
INTRAVENOUS | Status: AC
  Filled 2018-10-14: qty 100

## 2018-10-14 NOTE — Progress Notes (Signed)
PROGRESS NOTE                                                                                                                                                                                                             Patient Demographics:    Christina Clark, is a 68 y.o. female, DOB - Aug 19, 1950, EML:[id number]  Outpatient Primary MD for the patient is Patient, No Pcp Per    LOS - 4  Admit date - 10/10/2018       Patient only speaks Spanish and Marketing executive used  HPI: 68 year old Spanish-speaking female with past medical history of morbid obesity, hypertension and depression admitted to the hospitalist service on [place] presenting to the emergency room with 1 week of fever, cough and shortness of breath in the setting of known COVID-19 contacts.  Patient found to be COVID positive as well as hypoxic with bilateral infiltrates on chest x-ray.  Requiring oxygen support of high flow.  Started on Remdisivir, steroids and given Actemra x1.  No history of diabetes, but noted to be hyperglycemic.  A1c checked and patient found to have A1c of 8.5, and started on low-dose Levemir plus sliding scale.  Attempts to wean patient yesterday initially yielded some success, but by late afternoon, patient became tachypneic crying her oxygen back to 40.  This morning, still staying at 40, but able to crease FiO2 from 100% down to 85%.  Patient seemed to tolerate this well as long as she does not get agitated or stressed.  Through translator, patient states she is doing okay.  Feels like breathing is better than yesterday.  No complaints.     Assessment  & Plan :    Principal Problem:   Acute respiratory failure with hypoxia (HCC) secondary to COVID-19: 1 dose of Actemra.  Continue Remdisivir plus IV steroids and oxygen support.  Despite high need for oxygen, appears comfortable.  Titrating down FiO2 down and see how she tolerates.  We will also add some scheduled morning Xanax to see if  this helps keep her calm and oxygenation stable. Active Problems:   Hypertension: Blood pressure stable.  Continue home Cozaar and HCTZ.    Depression: Continue home Zoloft    Morbid obesity with BMI of 40.0-44.9, adult [facility]): Patient meets criteria with BMI greater than 40.    Diabetes mellitus type 2, uncontrolled ([place]): A1c of 8.5.  Discussed this diagnosis with the patient who told me actually that she has been told in the past that she does have diabetes.  She is not on any medications though.  Explained to her that while she is in the hospital start her on insulin, which she is amenable to and she is also amenable to being on a pill after she is discharged.  Continue to titrate up Levemir  Leukocytosis: Likely in the setting of steroids, recheck in the morning  FiO2 (%):  [80 %-100 %] 80 %  ABG     Component Value Date/Time   PHART 7.52 (H) 10/10/2018 1647   PCO2ART 34 10/10/2018 1647   PO2ART 53 (L) 10/10/2018 1647   HCO3 27.8 10/10/2018 1647   O2SAT 90.6 10/10/2018 1647    COVID-19 Labs  Recent Labs    10/12/18 0538  DDIMER 0.83*  FERRITIN 685*  LDH 270*  CRP 31.9*    Lab Results  Component Value Date   SARSCOV2NAA POSITIVE (A) 10/10/2018     Hepatic Function Latest Ref Rng & Units 10/14/2018 10/13/2018 10/12/2018  Total Protein 6.5 - 8.1 g/dL 8.9(H) 8.7(H) 8.9(H)  Albumin 3.5 - 5.0 g/dL 3.3(L) 3.2(L) 3.1(L)  AST 15 - 41 U/L '16 17 21  '$ ALT 0 - 44 U/L 41 42 45(H)  Alk Phosphatase 38 - 126 U/L 48 49 46  Total Bilirubin 0.3 - 1.2 mg/dL 0.4 0.5 0.2(L)     No results found for: BNP     Condition : Stable, slowly improving  Family Communication  : Updated daughter by phone  Code Status : Full code  Diet : Carb modified low-sodium  Diet Order            Diet Heart Room service appropriate? Yes; Fluid consistency: Thin  Diet effective now               Disposition Plan  : Stable, continue to try to decrease FiO2.  Potential transfer to floor the  next few days  Consults  : Critical care, signed off 6/13  Procedures  : None  PUD Prophylaxis : IV Protonix  DVT Prophylaxis  : Weight-based Lovenox  Lab Results  Component Value Date   PLT 421 (H) 10/12/2018    Inpatient Medications  Scheduled Meds:  Chlorhexidine Gluconate Cloth  6 each Topical Q0600   enoxaparin (LOVENOX) injection  0.5 mg/kg Subcutaneous Q12H   hydrochlorothiazide  25 mg Oral Daily   insulin aspart  0-20 Units Subcutaneous TID WC   insulin aspart  0-5 Units Subcutaneous QHS   insulin detemir  15 Units Subcutaneous QHS   losartan  50 mg Oral Daily   methylPREDNISolone (SOLU-MEDROL) injection  80 mg Intravenous Q8H   sertraline  50 mg Oral Daily   sodium chloride flush  3 mL Intravenous Q12H   Continuous Infusions:  sodium chloride Stopped (10/12/18 1042)   remdesivir 100 mg in NS 250 mL 100 mg (10/14/18 0901)   PRN Meds:.sodium chloride, acetaminophen, ondansetron **OR** ondansetron (ZOFRAN) IV, polyethylene glycol, sodium chloride flush  Antibiotics  :    Anti-infectives (From admission, onward)   Start     Dose/Rate Route Frequency Ordered Stop   10/12/18 0900  remdesivir 100 mg in sodium chloride 0.9 % 230 mL IVPB     100 mg 500 mL/hr over 30 Minutes Intravenous Every 24 hours 10/11/18 0745 10/16/18 0859   10/11/18 0900  remdesivir 200 mg in sodium chloride 0.9 % 210 mL IVPB     200 mg 500 mL/hr over 30 Minutes Intravenous Once 10/11/18 0745 10/11/18 Dodson Branch M.D  on 10/14/2018 at 12:47 PM  To page go to [email or web address] - password [facility]  [facility] -  Office  [phone]   See all Orders from today for further details    Objective:   Vitals:   10/14/18 0800 10/14/18 0900 10/14/18 1000 10/14/18 1102  BP: 135/60 121/64 120/71 134/73  Pulse: 88 77 83 73  Resp: (!) 21 (!) 29 (!) 24 (!) 30  Temp:      TempSrc:      SpO2: 92% 90% 92% (!) 85%  Weight:      Height:        Wt Readings from Last  3 Encounters:  10/10/18 108.7 kg  10/10/18 109 kg     Intake/Output Summary (Last 24 hours) at 10/14/2018 1247 Last data filed at 10/14/2018 1100 Gross per 24 hour  Intake 500 ml  Output 975 ml  Net -475 ml     Physical Exam  Gen: Alert and oriented x3, no acute distress HEENT: Normocephalic and atraumatic, mucous membranes are moist Neck: Supple, no JVD CV: Regular rate and rhythm, S1-S2 Lungs: Better inspiratory effort, clear to auscultation bilaterally Abd: Soft, nontender, nondistended, positive bowel sounds Extremities: No clubbing cyanosis or edema Neuro: No focal deficits Skin: No skin breaks, tears or lesions Psychiatry: Appropriate, no evidence of psychoses    Data Review:    CBC Recent Labs  Lab 10/10/18 1631 10/12/18 0538  WBC 14.9* 23.2*  HGB 12.6 12.7  HCT 37.5 39.0  PLT 327 421*  MCV 91.7 93.3  MCH 30.8 30.4  MCHC 33.6 32.6  RDW 14.4 14.4  LYMPHSABS 1.3 1.1  MONOABS 0.8 0.5  EOSABS 0.0 0.1  BASOSABS 0.0 0.0    Chemistries  Recent Labs  Lab 10/10/18 1631 10/12/18 0538 10/13/18 0550 10/14/18 0535  NA 134* 134* 136 136  K 3.8 3.6 3.7 3.8  CL 95* 94* 98 95*  CO2 '26 24 25 26  '$ GLUCOSE 171* 269* 282* 284*  BUN 25* 39* 57* 56*  CREATININE 0.99 0.89 0.95 1.08*  CALCIUM 8.7* 8.8* 9.2 9.4  AST '27 21 17 16  '$ ALT 42 45* 42 41  ALKPHOS 42 46 49 48  BILITOT 0.6 0.2* 0.5 0.4   ------------------------------------------------------------------------------------------------------------------ No results for input(s): CHOL, HDL, LDLCALC, TRIG, CHOLHDL, LDLDIRECT in the last 72 hours.  Lab Results  Component Value Date   HGBA1C 8.1 (H) 10/11/2018   ------------------------------------------------------------------------------------------------------------------ No results for input(s): TSH, T4TOTAL, T3FREE, THYROIDAB in the last 72 hours.  Invalid input(s): FREET3  Cardiac Enzymes Recent Labs  Lab 10/11/18 0550 10/11/18 1140  TROPONINI  <0.03 <0.03   ------------------------------------------------------------------------------------------------------------------ No results found for: BNP  Micro Results Recent Results (from the past 240 hour(s))  Blood Culture (routine x 2)     Status: None (Preliminary result)   Collection Time: 10/10/18  4:31 PM   Specimen: BLOOD  Result Value Ref Range Status   Specimen Description BLOOD LEFT ANTECUBITAL  Final   Special Requests   Final    BOTTLES DRAWN AEROBIC AND ANAEROBIC Blood Culture adequate volume   Culture   Final    NO GROWTH 4 DAYS Performed at [facility], [address] [place], [place] [address]    Report Status PENDING  Incomplete  Blood Culture (routine x 2)     Status: None (Preliminary result)   Collection Time: 10/10/18  4:31 PM   Specimen: BLOOD  Result Value Ref Range Status   Specimen Description BLOOD RIGHT ANTECUBITAL  Final  Special Requests   Final    BOTTLES DRAWN AEROBIC AND ANAEROBIC Blood Culture results may not be optimal due to an excessive volume of blood received in culture bottles   Culture   Final    NO GROWTH 4 DAYS Performed at [facility], [address], [place], [place] [address]    Report Status PENDING  Incomplete  SARS Coronavirus 2 (CEPHEID- Performed in [place] hospital lab), Hosp Order     Status: Abnormal   Collection Time: 10/10/18  4:47 PM   Specimen: Nasopharyngeal Swab  Result Value Ref Range Status   SARS Coronavirus 2 POSITIVE (A) NEGATIVE Final    Comment: RESULT CALLED TO, READ BACK BY AND VERIFIED WITH: TOM NAGY 10/10/18 @ 5573  [place] (NOTE) If result is NEGATIVE SARS-CoV-2 target nucleic acids are NOT DETECTED. The SARS-CoV-2 RNA is generally detectable in upper and lower  respiratory specimens during the acute phase of infection. The lowest  concentration of SARS-CoV-2 viral copies this assay can detect is 250  copies / mL. A negative result does not preclude SARS-CoV-2 infection  and  should not be used as the sole basis for treatment or other  patient management decisions.  A negative result may occur with  improper specimen collection / handling, submission of specimen other  than nasopharyngeal swab, presence of viral mutation(s) within the  areas targeted by this assay, and inadequate number of viral copies  (<250 copies / mL). A negative result must be combined with clinical  observations, patient history, and epidemiological information. If result is POSITIVE SARS-CoV-2 target nucleic acids are DETECTED. The SARS -CoV-2 RNA is generally detectable in upper and lower  respiratory specimens during the acute phase of infection.  Positive  results are indicative of active infection with SARS-CoV-2.  Clinical  correlation with patient history and other diagnostic information is  necessary to determine patient infection status.  Positive results do  not rule out bacterial infection or co-infection with other viruses. If result is PRESUMPTIVE POSTIVE SARS-CoV-2 nucleic acids MAY BE PRESENT.   A presumptive positive result was obtained on the submitted specimen  and confirmed on repeat testing.  While 2019 novel coronavirus  (SARS-CoV-2) nucleic acids may be present in the submitted sample  additional confirmatory testing may be necessary for epidemiological  and / or clinical management purposes  to differentiate between  SARS-CoV-2 and other Sarbecovirus currently known to infect humans.  If clinically indicated additional testing with an alternate test  methodology [id number]) is advise d. The SARS-CoV-2 RNA is generally  detectable in upper and lower respiratory specimens during the acute  phase of infection. The expected result is Negative. Fact Sheet for Patients:  StrictlyIdeas.no Fact Sheet for Healthcare Providers: BankingDealers.co.za This test is not yet approved or cleared by the [place] FDA and has been  authorized for detection and/or diagnosis of SARS-CoV-2 by FDA under an Emergency Use Authorization (EUA).  This EUA will remain in effect (meaning this test can be used) for the duration of the COVID-19 declaration under Section 564(b)(1) of the Act, 21 U.S.C. section 360bbb-3(b)(1), unless the authorization is terminated or revoked sooner. Performed at [facility], [address], [place],  [address]   Urine culture     Status: None   Collection Time: 10/10/18  8:39 PM   Specimen: Urine, Random  Result Value Ref Range Status   Specimen Description   Final    URINE, RANDOM Performed at [facility], [address] [place], [place] [address]    Special Requests   Final    Normal Performed at [facility], [address] [place],  [address]    Culture   Final    Multiple bacterial morphotypes present, none predominant. Suggest appropriate recollection if clinically indicated.   Report Status 10/12/2018 FINAL  Final    Radiology Reports Dg Chest Port 1 View  Result Date: 10/10/2018 CLINICAL DATA:  Fever and shortness of breath EXAM: PORTABLE CHEST 1 VIEW COMPARISON:  None. FINDINGS: There are bilateral streaky airspace opacities, worst in the right upper lobe. Cardiomediastinal contours are normal. No pneumothorax or sizable pleural effusion. IMPRESSION: Bilateral streaky airspace opacities which may indicate multifocal infection. Electronically Signed   By: Ulyses Jarred M.D.   On: 10/10/2018 17:30

## 2018-10-14 NOTE — Progress Notes (Addendum)
Pt spoke with daughter for app 30 min. Daughter updated  89: pts daughter called, pt was resting comfortably, stated she would call back later

## 2018-10-14 NOTE — Progress Notes (Signed)
Pts family dropped off cell phone and cell phone charger. Both items with patient at bedside and accounted for.

## 2018-10-14 NOTE — Progress Notes (Signed)
Pt spoke with daughter on phone and through [place]. Daughter expressed that she did not hear from any of her moms physicians today. I apologized and advised I would pass it along to day shift to make sure someone calls her.

## 2018-10-15 LAB — CBC
HCT: 42.6 % (ref 36.0–46.0)
Hemoglobin: 13.7 g/dL (ref 12.0–15.0)
MCH: 30.2 pg (ref 26.0–34.0)
MCHC: 32.2 g/dL (ref 30.0–36.0)
MCV: 93.8 fL (ref 80.0–100.0)
Platelets: 501 10*3/uL — ABNORMAL HIGH (ref 150–400)
RBC: 4.54 MIL/uL (ref 3.87–5.11)
RDW: 14.7 % (ref 11.5–15.5)
WBC: 16 10*3/uL — ABNORMAL HIGH (ref 4.0–10.5)
nRBC: 0 % (ref 0.0–0.2)

## 2018-10-15 LAB — COMPREHENSIVE METABOLIC PANEL
ALT: 38 U/L (ref 0–44)
AST: 18 U/L (ref 15–41)
Albumin: 3.3 g/dL — ABNORMAL LOW (ref 3.5–5.0)
Alkaline Phosphatase: 45 U/L (ref 38–126)
Anion gap: 12 (ref 5–15)
BUN: 50 mg/dL — ABNORMAL HIGH (ref 8–23)
CO2: 26 mmol/L (ref 22–32)
Calcium: 9.1 mg/dL (ref 8.9–10.3)
Chloride: 98 mmol/L (ref 98–111)
Creatinine, Ser: 0.99 mg/dL (ref 0.44–1.00)
GFR calc Af Amer: >60 mL/min (ref >60)
GFR calc non Af Amer: 59 mL/min — ABNORMAL LOW (ref >60)
Glucose, Bld: 244 mg/dL — ABNORMAL HIGH (ref 70–99)
Potassium: 4 mmol/L (ref 3.5–5.1)
Sodium: 136 mmol/L (ref 135–145)
Total Bilirubin: 0.4 mg/dL (ref 0.3–1.2)
Total Protein: 8.1 g/dL (ref 6.5–8.1)

## 2018-10-15 LAB — GLUCOSE, CAPILLARY
Glucose-Capillary: 324 mg/dL — ABNORMAL HIGH (ref 70–99)
Glucose-Capillary: 339 mg/dL — ABNORMAL HIGH (ref 70–99)
Glucose-Capillary: 240 mg/dL — ABNORMAL HIGH (ref 70–99)
Glucose-Capillary: 195 mg/dL — ABNORMAL HIGH (ref 70–99)
Glucose-Capillary: 238 mg/dL — ABNORMAL HIGH (ref 70–99)
Glucose-Capillary: 215 mg/dL — ABNORMAL HIGH (ref 70–99)

## 2018-10-15 LAB — LACTATE DEHYDROGENASE: LDH: 304 U/L — ABNORMAL HIGH (ref 98–192)

## 2018-10-15 LAB — C-REACTIVE PROTEIN: CRP: 7.7 mg/dL — ABNORMAL HIGH (ref <1.0)

## 2018-10-15 LAB — FERRITIN: Ferritin: 627 ng/mL — ABNORMAL HIGH (ref 11–307)

## 2018-10-15 MED ORDER — FUROSEMIDE 10 MG/ML IJ SOLN
20.0000 mg | Freq: Once | INTRAMUSCULAR | Status: AC
  Administered 2018-10-15: 13:00:00 20 mg via INTRAVENOUS
  Filled 2018-10-15: qty 2

## 2018-10-15 MED ORDER — INSULIN DETEMIR 100 UNIT/ML ~~LOC~~ SOLN
20.0000 [IU] | Freq: Every day | SUBCUTANEOUS | Status: DC
  Administered 2018-10-15: 20 [IU] via SUBCUTANEOUS
  Filled 2018-10-15 (×2): qty 0.2

## 2018-10-15 MED ORDER — INSULIN ASPART 100 UNIT/ML ~~LOC~~ SOLN
4.0000 [IU] | Freq: Three times a day (TID) | SUBCUTANEOUS | Status: DC
  Administered 2018-10-15 – 2018-10-31 (×41): 4 [IU] via SUBCUTANEOUS

## 2018-10-15 NOTE — Progress Notes (Signed)
Pts daughter called and updated. Pt spoke with daughter as well. All questions answered and family updated at this time.

## 2018-10-15 NOTE — Progress Notes (Signed)
PROGRESS NOTE  [name]  ZOX:[id number] DOB: February 08, 1951 DOA: 10/10/2018 PCP: Patient, No Pcp Per   Brief Narrative: 68 year old Spanish-speaking female with past medical history of morbid obesity, hypertension and depression admitted to the hospitalist service [facility] presenting to the emergency room with 1 week of fever, cough and shortness of breath in the setting of known COVID-19 contacts.  Patient found to be COVID positive as well as hypoxic with bilateral infiltrates on chest x-ray.  Requiring oxygen support of high flow.  Started on Remdisivir, steroids and given Actemra x1.  No history of diabetes, but noted to be hyperglycemic.  A1c checked and patient found to have A1c of 8.5, and started on low-dose Levemir plus sliding scale. Inflammatory markers have decreased, though she continues to require high levels of oxygen.  Assessment & Plan: Principal Problem:   Acute respiratory failure with hypoxia (HCC) Active Problems:   Hypertension   Depression   Acute respiratory disease due to COVID-19 virus   Morbid obesity with BMI of 40.0-44.9, adult (HCC)   Diabetes mellitus type 2, uncontrolled (HCC)  Acute respiratory failure with hypoxia due to COVID-19 pneumonia: s/p actemra x1 dose - Complete 5 days remdesivir (6/11 - 6/15) - Continue steroids.  - Can attempt diuresis, monitor I/O maintain even/negative balance - Avoid NSAIDs - Continue airborne/droplet precautions.  - Continues to require significant oxygen but in no respiratory distress. Appreciate PCCM evaluation, discussed mechanical ventilation in the event that respiratory distress doers develop. This provoked significant anxiety and will need to be revisited in that case. Stressed proning.  Hypertension: Blood pressure stable.   - Continue home ARB and HCTZ.  Depression: Having a significant amount of anxiety while hospitalized.  - Continue scheduled low dose xanax started while inpatient. -  Continue home zoloft  Morbid obesity: BMI 41.   T2DM, uncontrolled with hyperglycemia: HbA1c of 8.5%. Not on medications at home.  - Continue to titrate insulin, increase levemir and add mealtime insulin today. Will likely discharge on oral medications with PCP follow up once clinically improving.   Leukocytosis: Suspect stress demargination and steroid effect. Improving - Monitor for secondary infections.  DVT prophylaxis: Lovenox Code Status: Full Family Communication: Daughter by phone Disposition Plan: Remain in ICU for close monitoring  Consultants:   PCCM  Procedures:   None  Antimicrobials:  Remdesivir 6/11 - 6/15  Azithromycin 6/10   Subjective: Feels fine, about the same as yesterday, does not feel short of breath. No chest pain no fever. Tired after receiving xanax this AM. Spanish Interpretor used throughout encounter.  Objective: Vitals:   10/15/18 0600 10/15/18 0700 10/15/18 0800 10/15/18 0900  BP: 128/73 (!) 122/53 (!) 144/92 122/73  Pulse: 93 66 67 82  Resp: (!) 35 (!) 27 (!) 28 (!) 23  Temp:   98 F (36.7 C)   TempSrc:   Axillary   SpO2: (!) 86% 96% (!) 89% (!) 89%  Weight:      Height:        Intake/Output Summary (Last 24 hours) at 10/15/2018 1048 Last data filed at 10/15/2018 0900 Gross per 24 hour  Intake 490 ml  Output 1550 ml  Net -1060 ml   Filed Weights   10/10/18 2300  Weight: 108.7 kg    Gen: 68 y.o. female in no distress Pulm: Non-labored tachypnea ~25/min. Clear to auscultation bilaterally.  CV: Regular rate and rhythm. No murmur, rub, or gallop. No JVD, no pedal edema. GI: Abdomen soft, non-tender, non-distended, with  normoactive bowel sounds. No organomegaly or masses felt. Ext: Warm, no deformities Skin: No rashes, lesions ulcers Neuro: Alert and oriented. No focal neurological deficits. Psych: Judgement and insight appear normal. Mood & affect appropriate.   Data Reviewed: I have personally reviewed following labs and  imaging studies  CBC: Recent Labs  Lab 10/10/18 1631 10/12/18 0538 10/15/18 0520  WBC 14.9* 23.2* 16.0*  NEUTROABS 12.7* 21.4*  --   HGB 12.6 12.7 13.7  HCT 37.5 39.0 42.6  MCV 91.7 93.3 93.8  PLT 327 421* 501*   Basic Metabolic Panel: Recent Labs  Lab 10/10/18 1631 10/12/18 0538 10/13/18 0550 10/14/18 0535 10/15/18 0520  NA 134* 134* 136 136 136  K 3.8 3.6 3.7 3.8 4.0  CL 95* 94* 98 95* 98  CO2 26 24 25 26 26   GLUCOSE 171* 269* 282* 284* 244*  BUN 25* 39* 57* 56* 50*  CREATININE 0.99 0.89 0.95 1.08* 0.99  CALCIUM 8.7* 8.8* 9.2 9.4 9.1   GFR: Estimated Creatinine Clearance: 67.3 mL/min (by C-G formula based on SCr of 0.99 mg/dL). Liver Function Tests: Recent Labs  Lab 10/10/18 1631 10/12/18 0538 10/13/18 0550 10/14/18 0535 10/15/18 0520  AST 27 21 17 16 18   ALT 42 45* 42 41 38  ALKPHOS 42 46 49 48 45  BILITOT 0.6 0.2* 0.5 0.4 0.4  PROT 8.7* 8.9* 8.7* 8.9* 8.1  ALBUMIN 3.3* 3.1* 3.2* 3.3* 3.3*   No results for input(s): LIPASE, AMYLASE in the last 168 hours. No results for input(s): AMMONIA in the last 168 hours. Coagulation Profile: No results for input(s): INR, PROTIME in the last 168 hours. Cardiac Enzymes: Recent Labs  Lab 10/11/18 0550 10/11/18 1140  TROPONINI <0.03 <0.03   BNP (last 3 results) No results for input(s): PROBNP in the last 8760 hours. HbA1C: No results for input(s): HGBA1C in the last 72 hours. CBG: Recent Labs  Lab 10/14/18 0745 10/14/18 1117 10/14/18 1552 10/14/18 2115 10/15/18 0815  GLUCAP 293* 344* 262* 270* 240*   Lipid Profile: No results for input(s): CHOL, HDL, LDLCALC, TRIG, CHOLHDL, LDLDIRECT in the last 72 hours. Thyroid Function Tests: No results for input(s): TSH, T4TOTAL, FREET4, T3FREE, THYROIDAB in the last 72 hours. Anemia Panel: Recent Labs    10/15/18 0520  FERRITIN 627*   Urine analysis:    Component Value Date/Time   COLORURINE YELLOW (A) 10/10/2018 2039   APPEARANCEUR CLOUDY (A) 10/10/2018  2039   LABSPEC 1.020 10/10/2018 2039   PHURINE 5.0 10/10/2018 2039   GLUCOSEU NEGATIVE 10/10/2018 2039   HGBUR NEGATIVE 10/10/2018 2039   BILIRUBINUR NEGATIVE 10/10/2018 2039   KETONESUR NEGATIVE 10/10/2018 2039   PROTEINUR 30 (A) 10/10/2018 2039   NITRITE NEGATIVE 10/10/2018 2039   LEUKOCYTESUR TRACE (A) 10/10/2018 2039   Recent Results (from the past 240 hour(s))  Blood Culture (routine x 2)     Status: None (Preliminary result)   Collection Time: 10/10/18  4:31 PM   Specimen: BLOOD  Result Value Ref Range Status   Specimen Description BLOOD LEFT ANTECUBITAL  Final   Special Requests   Final    BOTTLES DRAWN AEROBIC AND ANAEROBIC Blood Culture adequate volume   Culture   Final    NO GROWTH 4 DAYS Performed at [facility], [address] [place], [place] [address]    Report Status PENDING  Incomplete  Blood Culture (routine x 2)     Status: None (Preliminary result)   Collection Time: 10/10/18  4:31 PM   Specimen: BLOOD  Result Value Ref Range Status   Specimen Description BLOOD RIGHT ANTECUBITAL  Final   Special Requests   Final    BOTTLES DRAWN AEROBIC AND ANAEROBIC Blood Culture results may not be optimal due to an excessive volume of blood received in culture bottles   Culture   Final    NO GROWTH 4 DAYS Performed at [facility], [address] [place], [place] [address]    Report Status PENDING  Incomplete  SARS Coronavirus 2 (CEPHEID- Performed in [place] hospital lab), Hosp Order     Status: Abnormal   Collection Time: 10/10/18  4:47 PM   Specimen: Nasopharyngeal Swab  Result Value Ref Range Status   SARS Coronavirus 2 POSITIVE (A) NEGATIVE Final    Comment: RESULT CALLED TO, READ BACK BY AND VERIFIED WITH: TOM NAGY 10/10/18 @ 9476  [place] (NOTE) If result is NEGATIVE SARS-CoV-2 target nucleic acids are NOT DETECTED. The SARS-CoV-2 RNA is generally detectable in upper and lower  respiratory specimens during the acute phase of infection.  The lowest  concentration of SARS-CoV-2 viral copies this assay can detect is 250  copies / mL. A negative result does not preclude SARS-CoV-2 infection  and should not be used as the sole basis for treatment or other  patient management decisions.  A negative result may occur with  improper specimen collection / handling, submission of specimen other  than nasopharyngeal swab, presence of viral mutation(s) within the  areas targeted by this assay, and inadequate number of viral copies  (<250 copies / mL). A negative result must be combined with clinical  observations, patient history, and epidemiological information. If result is POSITIVE SARS-CoV-2 target nucleic acids are DETECTED. The SARS -CoV-2 RNA is generally detectable in upper and lower  respiratory specimens during the acute phase of infection.  Positive  results are indicative of active infection with SARS-CoV-2.  Clinical  correlation with patient history and other diagnostic information is  necessary to determine patient infection status.  Positive results do  not rule out bacterial infection or co-infection with other viruses. If result is PRESUMPTIVE POSTIVE SARS-CoV-2 nucleic acids MAY BE PRESENT.   A presumptive positive result was obtained on the submitted specimen  and confirmed on repeat testing.  While 2019 novel coronavirus  (SARS-CoV-2) nucleic acids may be present in the submitted sample  additional confirmatory testing may be necessary for epidemiological  and / or clinical management purposes  to differentiate between  SARS-CoV-2 and other Sarbecovirus currently known to infect humans.  If clinically indicated additional testing with an alternate test  methodology [phone]) is advise d. The SARS-CoV-2 RNA is generally  detectable in upper and lower respiratory specimens during the acute  phase of infection. The expected result is Negative. Fact Sheet for Patients:  StrictlyIdeas.no  Fact Sheet for Healthcare Providers: BankingDealers.co.za This test is not yet approved or cleared by the [place] FDA and has been authorized for detection and/or diagnosis of SARS-CoV-2 by FDA under an Emergency Use Authorization (EUA).  This EUA will remain in effect (meaning this test can be used) for the duration of the COVID-19 declaration under Section 564(b)(1) of the Act, 21 U.S.C. section 360bbb-3(b)(1), unless the authorization is terminated or revoked sooner. Performed at [facility], [address] [place], [place] [address]   Urine culture     Status: None   Collection Time: 10/10/18  8:39 PM   Specimen: Urine, Random  Result Value Ref Range Status   Specimen Description  Final    URINE, RANDOM Performed at [facility], [address] [place], [place] [address]    Special Requests   Final    Normal Performed at [facility], [address] [place], [place] [address]    Culture   Final    Multiple bacterial morphotypes present, none predominant. Suggest appropriate recollection if clinically indicated.   Report Status 10/12/2018 FINAL  Final      Radiology Studies: No results found.  Scheduled Meds: . ALPRAZolam  0.25 mg Oral Daily  . Chlorhexidine Gluconate Cloth  6 each Topical Q0600  . enoxaparin (LOVENOX) injection  0.5 mg/kg Subcutaneous Q12H  . hydrochlorothiazide  25 mg Oral Daily  . insulin aspart  0-20 Units Subcutaneous TID WC  . insulin aspart  0-5 Units Subcutaneous QHS  . insulin aspart  4 Units Subcutaneous TID WC  . insulin detemir  20 Units Subcutaneous QHS  . losartan  50 mg Oral Daily  . methylPREDNISolone (SOLU-MEDROL) injection  60 mg Intravenous Q8H  . sertraline  50 mg Oral Daily  . sodium chloride flush  3 mL Intravenous Q12H   Continuous Infusions: . sodium chloride Stopped (10/12/18 1042)     LOS: 5 days   Time spent: 35 minutes.  Tyrone Nineyan B Xzayvion Vaeth, MD [facility] [email or web address] Password [email or web address] 10/15/2018, 10:48 AM

## 2018-10-15 NOTE — Progress Notes (Signed)
NAME:  Christina Clark, MRN:  [id number], DOB:  [birth year], LOS: 5 ADMISSION DATE:  10/10/2018, CONSULTATION DATE:  10/11/2018 REFERRING MD:  Mathews Robinsons MD, CHIEF COMPLAINT: COVID 59 pneumonia  Brief History   68 year old with hypertension, morbid obesity, depression admitted with COVID-19 pneumonia, acute respiratory failure requiring high flow nasal cannula.  Past Medical History  Hypertension, morbid obesity, depression  Significant Hospital Events   6/11- Admit  Consults:  PCCM  Procedures:    Significant Diagnostic Tests:    Micro Data:    Antimicrobials/COVID therapy  Remdesivir 6/11 > Steroids 6/11 > Actemra x1  Interim history/subjective:  Remains on HFNC, now at 50 lpm No resp distress. No complaints today AM  Objective   Blood pressure 122/73, pulse 82, temperature 98 F (36.7 C), temperature source Axillary, resp. rate (!) 23, height 5\' 4"  (1.626 m), weight 108.7 kg, SpO2 (!) 89 %.    FiO2 (%):  [80 %-100 %] 100 %   Intake/Output Summary (Last 24 hours) at 10/15/2018 1033 Last data filed at 10/15/2018 0900 Gross per 24 hour  Intake 490 ml  Output 1550 ml  Net -1060 ml   Filed Weights   10/10/18 2300  Weight: 108.7 kg    Examination:.. Blood pressure 122/73, pulse 82, temperature 98 F (36.7 C), temperature source Axillary, resp. rate (!) 23, height 5\' 4"  (1.626 m), weight 108.7 kg, SpO2 (!) 89 %. Gen:      No acute distress HEENT:  EOMI, sclera anicteric Neck:     No masses; no thyromegaly Lungs:    Clear to auscultation bilaterally; normal respiratory effort CV:         Regular rate and rhythm; no murmurs Abd:      + bowel sounds; soft, non-tender; no palpable masses, no distension Ext:    No edema; adequate peripheral perfusion Skin:      Warm and dry; no rash Neuro: alert and oriented x 3 Psych: normal mood and affect  Resolved Hospital Problem list     Assessment & Plan:  68 year old with COVID-19 pneumonia  Severe respiratory  failure Tenuous respiratory status Continue high flow nasal cannula.  Wean down FiO2 as tolerated Encouraged awake proning but pt is uncomfortable on belly Diuresis as tolerated [place]  COVID-19 pneumonia Getting steroids, Remicade, Actemra Discussed convalescent plasma with patient.  She does not want to enroll in the study.  Best practice:  Diet: PO diet Pain/Anxiety/Delirium protocol (if indicated): NA VAP protocol (if indicated): NA DVT prophylaxis: Lovenox GI prophylaxis: NA Glucose control: Monitor sugars Mobility: Bed Code Status: Full Family Communication: per [facility] Disposition: ICU  Labs   CBC: Recent Labs  Lab 10/10/18 1631 10/12/18 0538 10/15/18 0520  WBC 14.9* 23.2* 16.0*  NEUTROABS 12.7* 21.4*  --   HGB 12.6 12.7 13.7  HCT 37.5 39.0 42.6  MCV 91.7 93.3 93.8  PLT 327 421* 501*    Basic Metabolic Panel: Recent Labs  Lab 10/10/18 1631 10/12/18 0538 10/13/18 0550 10/14/18 0535 10/15/18 0520  NA 134* 134* 136 136 136  K 3.8 3.6 3.7 3.8 4.0  CL 95* 94* 98 95* 98  CO2 26 24 25 26 26   GLUCOSE 171* 269* 282* 284* 244*  BUN 25* 39* 57* 56* 50*  CREATININE 0.99 0.89 0.95 1.08* 0.99  CALCIUM 8.7* 8.8* 9.2 9.4 9.1   GFR: Estimated Creatinine Clearance: 67.3 mL/min (by C-G formula based on SCr of 0.99 mg/dL). Recent Labs  Lab 10/10/18 1631 10/11/18 0550 10/12/18 7253 10/15/18 6644  PROCALCITON  --  0.10  --   --   WBC 14.9*  --  23.2* 16.0*  LATICACIDVEN 1.2  --   --   --     Liver Function Tests: Recent Labs  Lab 10/10/18 1631 10/12/18 0538 10/13/18 0550 10/14/18 0535 10/15/18 0520  AST 27 21 17 16 18   ALT 42 45* 42 41 38  ALKPHOS 42 46 49 48 45  BILITOT 0.6 0.2* 0.5 0.4 0.4  PROT 8.7* 8.9* 8.7* 8.9* 8.1  ALBUMIN 3.3* 3.1* 3.2* 3.3* 3.3*   No results for input(s): LIPASE, AMYLASE in the last 168 hours. No results for input(s): AMMONIA in the last 168 hours.  ABG    Component Value Date/Time   PHART 7.52 (H) 10/10/2018 1647    PCO2ART 34 10/10/2018 1647   PO2ART 53 (L) 10/10/2018 1647   HCO3 27.8 10/10/2018 1647   O2SAT 90.6 10/10/2018 1647     Coagulation Profile: No results for input(s): INR, PROTIME in the last 168 hours.  Cardiac Enzymes: Recent Labs  Lab 10/11/18 0550 10/11/18 1140  TROPONINI <0.03 <0.03    HbA1C: Hgb A1c MFr Bld  Date/Time Value Ref Range Status  10/11/2018 05:50 AM 8.1 (H) 4.8 - 5.6 % Final    Comment:    (NOTE) Pre diabetes:          5.7%-6.4% Diabetes:              >6.4% Glycemic control for   <7.0% adults with diabetes     CBG: Recent Labs  Lab 10/14/18 0745 10/14/18 1117 10/14/18 1552 10/14/18 2115 10/15/18 0815  GLUCAP 293* 344* 262* 270* 240*   Christina Askari MD [facility] Pager [phone] If no answer call [phone] 10/15/2018, 10:35 AM

## 2018-10-15 NOTE — Progress Notes (Signed)
Per pt "I rested and slept the most tonight than I have in days." Pt o2 saturation was between 85-94% most of the night. HF [place] levels increased to 50L and 100% fio2.

## 2018-10-15 NOTE — Progress Notes (Signed)
Pt repositioned in prone position,  tolerated well, 94%

## 2018-10-16 LAB — COMPREHENSIVE METABOLIC PANEL
ALT: 40 U/L (ref 0–44)
AST: 23 U/L (ref 15–41)
Albumin: 3.1 g/dL — ABNORMAL LOW (ref 3.5–5.0)
Alkaline Phosphatase: 41 U/L (ref 38–126)
Anion gap: 13 (ref 5–15)
BUN: 50 mg/dL — ABNORMAL HIGH (ref 8–23)
CO2: 24 mmol/L (ref 22–32)
Calcium: 8.9 mg/dL (ref 8.9–10.3)
Chloride: 100 mmol/L (ref 98–111)
Creatinine, Ser: 0.99 mg/dL (ref 0.44–1.00)
GFR calc Af Amer: >60 mL/min (ref >60)
GFR calc non Af Amer: 59 mL/min — ABNORMAL LOW (ref >60)
Glucose, Bld: 224 mg/dL — ABNORMAL HIGH (ref 70–99)
Potassium: 4.1 mmol/L (ref 3.5–5.1)
Sodium: 137 mmol/L (ref 135–145)
Total Bilirubin: 0.8 mg/dL (ref 0.3–1.2)
Total Protein: 7.7 g/dL (ref 6.5–8.1)

## 2018-10-16 LAB — C-REACTIVE PROTEIN: CRP: 4.3 mg/dL — ABNORMAL HIGH (ref <1.0)

## 2018-10-16 LAB — MRSA PCR SCREENING: MRSA by PCR: NEGATIVE

## 2018-10-16 LAB — GLUCOSE, CAPILLARY
Glucose-Capillary: 207 mg/dL — ABNORMAL HIGH (ref 70–99)
Glucose-Capillary: 226 mg/dL — ABNORMAL HIGH (ref 70–99)
Glucose-Capillary: 198 mg/dL — ABNORMAL HIGH (ref 70–99)
Glucose-Capillary: 177 mg/dL — ABNORMAL HIGH (ref 70–99)

## 2018-10-16 MED ORDER — INSULIN DETEMIR 100 UNIT/ML ~~LOC~~ SOLN
25.0000 [IU] | Freq: Every day | SUBCUTANEOUS | Status: DC
  Administered 2018-10-16 – 2018-10-30 (×13): 25 [IU] via SUBCUTANEOUS
  Filled 2018-10-16 (×16): qty 0.25

## 2018-10-16 MED ORDER — METHYLPREDNISOLONE SODIUM SUCC 40 MG IJ SOLR
40.0000 mg | Freq: Two times a day (BID) | INTRAMUSCULAR | Status: AC
  Administered 2018-10-16 – 2018-10-17 (×2): 40 mg via INTRAVENOUS
  Filled 2018-10-16 (×2): qty 1

## 2018-10-16 MED ORDER — METHYLPREDNISOLONE SODIUM SUCC 40 MG IJ SOLR: 40.0000 mg | Freq: Two times a day (BID) | INTRAMUSCULAR | Status: DC

## 2018-10-16 NOTE — Progress Notes (Signed)
Patient's daughter called requesting update. All questions answered with the exception of convalescent plasma. Daughter inquired about the transfusion. Writing RN did not receive in report. Notes do not indicate she received convalescent plasma. Will check with charge RN and notify daughter.

## 2018-10-16 NOTE — Progress Notes (Signed)
Daughter called and updated regarding convalescent plasma. No order for plasma today. Will transfer to dayshift nurse tomorrow to discuss with PCCM attending.

## 2018-10-16 NOTE — Progress Notes (Signed)
Patient remained prone for the majority of the shift. Patient did get up to the [facility] for about 45 minutes and she requested to eat her breakfast there. Attempted to get pt in the reclining chair with the help of her daughter and two other nurses but pt refused and stated she was fine. Pt refused to sit up in chair throughout the rest of the shift but has remained proned. Her 02 level has remained around 89-94%. RT has slowly been decreasing her Fi02. Will continue to educate pt on the importance of getting out of bed to chair.

## 2018-10-16 NOTE — Progress Notes (Signed)
Spoke with pt's daughter over the phone and updated her and answered any questions/ concerns she had. Pt also spoke with her daughter on the phone.

## 2018-10-16 NOTE — Progress Notes (Signed)
NAME:  Christina Clark, MRN:  [id number], DOB:  Jan 25, 1951, LOS: 6 ADMISSION DATE:  10/10/2018, CONSULTATION DATE:  10/11/2018 REFERRING MD:  Jarvis NewcomerGrunz, CHIEF COMPLAINT:  dyspnea   Brief History   68 year old morbidly obese female admitted for severe acute respiratory failure with hypoxemia due to COVID-19 pneumonia.  Past Medical History  Hypertension Morbid obesity Depression  Significant Hospital Events   Admission June 11  Consults:  Pulmonary and critical care medicine  Procedures:    Significant Diagnostic Tests:    Micro Data:  6/10 SARS COV-2 > positive 6/10 blood culture 6/10 urine culture  Antimicrobials:  6/12 Remdesivir > 6/15  6/11 Actemra x1  Interim history/subjective:  Diuresed well overnight No complaints this morning Has a little cough, minimal mucus production No chest pain Denies shortness of breath Has been lying in the prone position.  Objective   Blood pressure (!) 141/67, pulse 64, temperature 98.9 F (37.2 C), temperature source Oral, resp. rate (!) 30, height 5\' 4"  (1.626 m), weight 108.7 kg, SpO2 94 %.    FiO2 (%):  [95 %-100 %] 95 %   Intake/Output Summary (Last 24 hours) at 10/16/2018 1316 Last data filed at 10/16/2018 1042 Gross per 24 hour  Intake 160 ml  Output 1650 ml  Net -1490 ml   Filed Weights   10/10/18 2300  Weight: 108.7 kg    Examination:  General:  Resting comfortably in bed HENT: NCAT OP clear PULM: Crackles bases B, normal effort CV: RRR, no mgr GI: BS+, soft, nontender MSK: normal bulk and tone Neuro: awake, alert, no distress, MAEW   Resolved Hospital Problem list     Assessment & Plan:  ARDS due to COVID-19 pneumonia: Oxygenation remains poor, however the patient does not have complaints of shortness of breath Decision for intubation should be based on change in mental status or physical evidence of ventilatory failure such as nasal flaring, accessory muscle use, paradoxical breathing Remain in  ICU Continue to administer heated high flow oxygen to maintain O2 saturation greater than 85% Out of bed to chair as able Prone positioning while resting in bed Decrease Solu-Medrol, consider stopping tomorrow Agree with diuresis Family wants her to receive convalescent plasma, they want to discuss this again today  Best practice:  Diet: diabetic diet Pain/Anxiety/Delirium protocol (if indicated): n/a VAP protocol (if indicated): n/a DVT prophylaxis: Lovenox per COVID protocol GI prophylaxis: n/a Glucose control: SSI Mobility: out of bed as tolerated Code Status: full Family Communication: I updated her daughter Byrd HesselbachMaria today at length by phone Disposition: remain in ICU  Labs   CBC: Recent Labs  Lab 10/10/18 1631 10/12/18 0538 10/15/18 0520  WBC 14.9* 23.2* 16.0*  NEUTROABS 12.7* 21.4*  --   HGB 12.6 12.7 13.7  HCT 37.5 39.0 42.6  MCV 91.7 93.3 93.8  PLT 327 421* 501*    Basic Metabolic Panel: Recent Labs  Lab 10/12/18 0538 10/13/18 0550 10/14/18 0535 10/15/18 0520 10/16/18 0530  NA 134* 136 136 136 137  K 3.6 3.7 3.8 4.0 4.1  CL 94* 98 95* 98 100  CO2 24 25 26 26 24   GLUCOSE 269* 282* 284* 244* 224*  BUN 39* 57* 56* 50* 50*  CREATININE 0.89 0.95 1.08* 0.99 0.99  CALCIUM 8.8* 9.2 9.4 9.1 8.9   GFR: Estimated Creatinine Clearance: 67.3 mL/min (by C-G formula based on SCr of 0.99 mg/dL). Recent Labs  Lab 10/10/18 1631 10/11/18 0550 10/12/18 0538 10/15/18 0520  PROCALCITON  --  0.10  --   --  WBC 14.9*  --  23.2* 16.0*  LATICACIDVEN 1.2  --   --   --     Liver Function Tests: Recent Labs  Lab 10/12/18 0538 10/13/18 0550 10/14/18 0535 10/15/18 0520 10/16/18 0530  AST 21 17 16 18 23   ALT 45* 42 41 38 40  ALKPHOS 46 49 48 45 41  BILITOT 0.2* 0.5 0.4 0.4 0.8  PROT 8.9* 8.7* 8.9* 8.1 7.7  ALBUMIN 3.1* 3.2* 3.3* 3.3* 3.1*   No results for input(s): LIPASE, AMYLASE in the last 168 hours. No results for input(s): AMMONIA in the last 168 hours.   ABG    Component Value Date/Time   PHART 7.52 (H) 10/10/2018 1647   PCO2ART 34 10/10/2018 1647   PO2ART 53 (L) 10/10/2018 1647   HCO3 27.8 10/10/2018 1647   O2SAT 90.6 10/10/2018 1647     Coagulation Profile: No results for input(s): INR, PROTIME in the last 168 hours.  Cardiac Enzymes: Recent Labs  Lab 10/11/18 0550 10/11/18 1140  TROPONINI <0.03 <0.03    HbA1C: Hgb A1c MFr Bld  Date/Time Value Ref Range Status  10/11/2018 05:50 AM 8.1 (H) 4.8 - 5.6 % Final    Comment:    (NOTE) Pre diabetes:          5.7%-6.4% Diabetes:              >6.4% Glycemic control for   <7.0% adults with diabetes     CBG: Recent Labs  Lab 10/15/18 1254 10/15/18 1552 10/15/18 2118 10/16/18 0805 10/16/18 1153  GLUCAP 195* 238* 215* 207* 226*     Critical care time: 35 minutes       Roselie Awkward, MD [facility] Pager: [phone] Cell: [phone] If no response, call [phone]

## 2018-10-16 NOTE — Progress Notes (Signed)
PROGRESS NOTE  [name]  ZOX:[id number] DOB: 10/16/1950 DOA: 10/10/2018 PCP: Patient, No Pcp Per   Brief Narrative: 68 year old Spanish-speaking female with past medical history of morbid obesity, hypertension and depression admitted to [facility] hospitalist service [facility] presenting to the emergency room with 1 week of fever, cough and shortness of breath in the setting of known COVID-19 contacts.  Patient found to be COVID positive as well as hypoxic with bilateral infiltrates on chest x-ray.  Requiring oxygen support of high flow.  Started on Remdisivir, steroids and given Actemra x1.  No history of diabetes, but noted to be hyperglycemic.  A1c checked and patient found to have A1c of 8.5, and started on low-dose Levemir plus sliding scale. Inflammatory markers have decreased, though she continues to require high levels of oxygen.  Assessment & Plan: Principal Problem:   Acute respiratory failure with hypoxia (HCC) Active Problems:   Hypertension   Depression   Acute respiratory disease due to COVID-19 virus   Morbid obesity with BMI of 40.0-44.9, adult (HCC)   Diabetes mellitus type 2, uncontrolled (HCC)  Acute respiratory failure with hypoxia due to COVID-19 pneumonia: s/p actemra x1 dose - Completed 5 days remdesivir (6/11 - 6/15) - Continue steroids for 1 more day. - Monitor I/O maintain even/negative balance. Give lasix prn. - Avoid NSAIDs - Continue airborne/droplet precautions.  - Continue supplemental oxygen. Remains "happily hypoxic." Continue proning and mobilizing.  Hypertension: Blood pressure stable.   - Continue home ARB and HCTZ.  Depression: Having a significant amount of anxiety while hospitalized.  - Continue scheduled low dose xanax started while inpatient. - Continue home zoloft  Morbid obesity: BMI 41.   T2DM, uncontrolled with hyperglycemia: HbA1c of 8.5%. Not on medications at home.  - Continue to titrate insulin, increase levemir  further today to 25u qHS, continue mealtime insulin today. Will likely discharge on oral medications with PCP follow up once clinically improving.   Leukocytosis: Suspect stress demargination and steroid effect.  - Monitor for secondary infections.  DVT prophylaxis: Lovenox Code Status: Full Family Communication: Daughter by phone per PCCM Disposition Plan: Remain in ICU for close monitoring  Consultants:   PCCM  Procedures:   None  Antimicrobials:  Remdesivir 6/11 - 6/15  Azithromycin 6/10   Subjective: No complaints again today. Continues to be hypoxic but denies shortness of breath.  Objective: Vitals:   10/16/18 1100 10/16/18 1200 10/16/18 1300 10/16/18 1310  BP: 131/66 135/67  (!) 141/67  Pulse: 89 62 (!) 58 64  Resp: (!) 30     Temp:   98.9 F (37.2 C)   TempSrc:   Oral   SpO2: (!) 89% 95% 94% 94%  Weight:      Height:        Intake/Output Summary (Last 24 hours) at 10/16/2018 1403 Last data filed at 10/16/2018 1042 Gross per 24 hour  Intake 160 ml  Output 1650 ml  Net -1490 ml   Filed Weights   10/10/18 2300  Weight: 108.7 kg   Gen: 68 y.o. female in no distress Pulm: Nonlabored tachypnea with supplemental oxygen. Clear. CV: Regular rate and rhythm. No murmur, rub, or gallop. No JVD, no dependent edema. GI: Abdomen soft, non-tender, non-distended, with normoactive bowel sounds.  Ext: Warm, no deformities Skin: No rashes, lesions or ulcers on visualized skin. Neuro: Alert and oriented. No focal neurological deficits. Psych: Judgement and insight appear fair. Mood euthymic & affect congruent. Behavior is appropriate.    Data Reviewed:  I have personally reviewed following labs and imaging studies  CBC: Recent Labs  Lab 10/10/18 1631 10/12/18 0538 10/15/18 0520  WBC 14.9* 23.2* 16.0*  NEUTROABS 12.7* 21.4*  --   HGB 12.6 12.7 13.7  HCT 37.5 39.0 42.6  MCV 91.7 93.3 93.8  PLT 327 421* 501*   Basic Metabolic Panel: Recent Labs  Lab  10/12/18 0538 10/13/18 0550 10/14/18 0535 10/15/18 0520 10/16/18 0530  NA 134* 136 136 136 137  K 3.6 3.7 3.8 4.0 4.1  CL 94* 98 95* 98 100  CO2 24 25 26 26 24   GLUCOSE 269* 282* 284* 244* 224*  BUN 39* 57* 56* 50* 50*  CREATININE 0.89 0.95 1.08* 0.99 0.99  CALCIUM 8.8* 9.2 9.4 9.1 8.9   GFR: Estimated Creatinine Clearance: 67.3 mL/min (by C-G formula based on SCr of 0.99 mg/dL). Liver Function Tests: Recent Labs  Lab 10/12/18 0538 10/13/18 0550 10/14/18 0535 10/15/18 0520 10/16/18 0530  AST 21 17 16 18 23   ALT 45* 42 41 38 40  ALKPHOS 46 49 48 45 41  BILITOT 0.2* 0.5 0.4 0.4 0.8  PROT 8.9* 8.7* 8.9* 8.1 7.7  ALBUMIN 3.1* 3.2* 3.3* 3.3* 3.1*   No results for input(s): LIPASE, AMYLASE in the last 168 hours. No results for input(s): AMMONIA in the last 168 hours. Coagulation Profile: No results for input(s): INR, PROTIME in the last 168 hours. Cardiac Enzymes: Recent Labs  Lab 10/11/18 0550 10/11/18 1140  TROPONINI <0.03 <0.03   BNP (last 3 results) No results for input(s): PROBNP in the last 8760 hours. HbA1C: No results for input(s): HGBA1C in the last 72 hours. CBG: Recent Labs  Lab 10/15/18 1254 10/15/18 1552 10/15/18 2118 10/16/18 0805 10/16/18 1153  GLUCAP 195* 238* 215* 207* 226*   Lipid Profile: No results for input(s): CHOL, HDL, LDLCALC, TRIG, CHOLHDL, LDLDIRECT in the last 72 hours. Thyroid Function Tests: No results for input(s): TSH, T4TOTAL, FREET4, T3FREE, THYROIDAB in the last 72 hours. Anemia Panel: Recent Labs    10/15/18 0520  FERRITIN 627*   Urine analysis:    Component Value Date/Time   COLORURINE YELLOW (A) 10/10/2018 2039   APPEARANCEUR CLOUDY (A) 10/10/2018 2039   LABSPEC 1.020 10/10/2018 2039   PHURINE 5.0 10/10/2018 2039   GLUCOSEU NEGATIVE 10/10/2018 2039   HGBUR NEGATIVE 10/10/2018 2039   BILIRUBINUR NEGATIVE 10/10/2018 2039   KETONESUR NEGATIVE 10/10/2018 2039   PROTEINUR 30 (A) 10/10/2018 2039   NITRITE  NEGATIVE 10/10/2018 2039   LEUKOCYTESUR TRACE (A) 10/10/2018 2039   Recent Results (from the past 240 hour(s))  Blood Culture (routine x 2)     Status: None (Preliminary result)   Collection Time: 10/10/18  4:31 PM   Specimen: BLOOD  Result Value Ref Range Status   Specimen Description BLOOD LEFT ANTECUBITAL  Final   Special Requests   Final    BOTTLES DRAWN AEROBIC AND ANAEROBIC Blood Culture adequate volume   Culture   Final    NO GROWTH 4 DAYS Performed at [facility], [address] [place], [place] [address]    Report Status PENDING  Incomplete  Blood Culture (routine x 2)     Status: None (Preliminary result)   Collection Time: 10/10/18  4:31 PM   Specimen: BLOOD  Result Value Ref Range Status   Specimen Description BLOOD RIGHT ANTECUBITAL  Final   Special Requests   Final    BOTTLES DRAWN AEROBIC AND ANAEROBIC Blood Culture results may not be optimal due to  an excessive volume of blood received in culture bottles   Culture   Final    NO GROWTH 4 DAYS Performed at [facility], [address] [place], [place] [address]    Report Status PENDING  Incomplete  SARS Coronavirus 2 (CEPHEID- Performed in [place] hospital lab), Hosp Order     Status: Abnormal   Collection Time: 10/10/18  4:47 PM   Specimen: Nasopharyngeal Swab  Result Value Ref Range Status   SARS Coronavirus 2 POSITIVE (A) NEGATIVE Final    Comment: RESULT CALLED TO, READ BACK BY AND VERIFIED WITH: TOM NAGY 10/10/18 @ 4008  [place] (NOTE) If result is NEGATIVE SARS-CoV-2 target nucleic acids are NOT DETECTED. The SARS-CoV-2 RNA is generally detectable in upper and lower  respiratory specimens during the acute phase of infection. The lowest  concentration of SARS-CoV-2 viral copies this assay can detect is 250  copies / mL. A negative result does not preclude SARS-CoV-2 infection  and should not be used as the sole basis for treatment or other  patient management decisions.  A negative  result may occur with  improper specimen collection / handling, submission of specimen other  than nasopharyngeal swab, presence of viral mutation(s) within the  areas targeted by this assay, and inadequate number of viral copies  (<250 copies / mL). A negative result must be combined with clinical  observations, patient history, and epidemiological information. If result is POSITIVE SARS-CoV-2 target nucleic acids are DETECTED. The SARS -CoV-2 RNA is generally detectable in upper and lower  respiratory specimens during the acute phase of infection.  Positive  results are indicative of active infection with SARS-CoV-2.  Clinical  correlation with patient history and other diagnostic information is  necessary to determine patient infection status.  Positive results do  not rule out bacterial infection or co-infection with other viruses. If result is PRESUMPTIVE POSTIVE SARS-CoV-2 nucleic acids MAY BE PRESENT.   A presumptive positive result was obtained on the submitted specimen  and confirmed on repeat testing.  While 2019 novel coronavirus  (SARS-CoV-2) nucleic acids may be present in the submitted sample  additional confirmatory testing may be necessary for epidemiological  and / or clinical management purposes  to differentiate between  SARS-CoV-2 and other Sarbecovirus currently known to infect humans.  If clinically indicated additional testing with an alternate test  methodology [phone]) is advise d. The SARS-CoV-2 RNA is generally  detectable in upper and lower respiratory specimens during the acute  phase of infection. The expected result is Negative. Fact Sheet for Patients:  StrictlyIdeas.no Fact Sheet for Healthcare Providers: BankingDealers.co.za This test is not yet approved or cleared by the [place] FDA and has been authorized for detection and/or diagnosis of SARS-CoV-2 by FDA under an Emergency Use Authorization  (EUA).  This EUA will remain in effect (meaning this test can be used) for the duration of the COVID-19 declaration under Section 564(b)(1) of the Act, 21 U.S.C. section 360bbb-3(b)(1), unless the authorization is terminated or revoked sooner. Performed at [facility], [address] [place], [place] [address]   Urine culture     Status: None   Collection Time: 10/10/18  8:39 PM   Specimen: Urine, Random  Result Value Ref Range Status   Specimen Description   Final    URINE, RANDOM Performed at [facility], [address], [place], [place] [address]    Special Requests   Final    Normal Performed at [facility], [phone]  [address] [place], [place] [address]    Culture   Final    Multiple bacterial morphotypes present, none predominant. Suggest appropriate recollection if clinically indicated.   Report Status 10/12/2018 FINAL  Final      Radiology Studies: No results found.  Scheduled Meds: . ALPRAZolam  0.25 mg Oral Daily  . Chlorhexidine Gluconate Cloth  6 each Topical Q0600  . enoxaparin (LOVENOX) injection  0.5 mg/kg Subcutaneous Q12H  . hydrochlorothiazide  25 mg Oral Daily  . insulin aspart  0-20 Units Subcutaneous TID WC  . insulin aspart  0-5 Units Subcutaneous QHS  . insulin aspart  4 Units Subcutaneous TID WC  . insulin detemir  20 Units Subcutaneous QHS  . losartan  50 mg Oral Daily  . methylPREDNISolone (SOLU-MEDROL) injection  40 mg Intravenous Q12H  . sertraline  50 mg Oral Daily  . sodium chloride flush  3 mL Intravenous Q12H   Continuous Infusions: . sodium chloride Stopped (10/12/18 1042)     LOS: 6 days   Time spent: 35 minutes.  Tyrone Nineyan B Caralina Nop, MD [facility] [email or web address] Password [email or web address] 10/16/2018, 2:03 PM

## 2018-10-17 LAB — CULTURE, BLOOD (ROUTINE X 2)
Culture: NO GROWTH
Culture: NO GROWTH
Special Requests: ADEQUATE

## 2018-10-17 LAB — COMPREHENSIVE METABOLIC PANEL
ALT: 68 U/L — ABNORMAL HIGH (ref 0–44)
AST: 53 U/L — ABNORMAL HIGH (ref 15–41)
Albumin: 3.3 g/dL — ABNORMAL LOW (ref 3.5–5.0)
Alkaline Phosphatase: 40 U/L (ref 38–126)
Anion gap: 16 — ABNORMAL HIGH (ref 5–15)
BUN: 55 mg/dL — ABNORMAL HIGH (ref 8–23)
CO2: 23 mmol/L (ref 22–32)
Calcium: 9.2 mg/dL (ref 8.9–10.3)
Chloride: 96 mmol/L — ABNORMAL LOW (ref 98–111)
Creatinine, Ser: 1.13 mg/dL — ABNORMAL HIGH (ref 0.44–1.00)
GFR calc Af Amer: 59 mL/min — ABNORMAL LOW (ref >60)
GFR calc non Af Amer: 51 mL/min — ABNORMAL LOW (ref >60)
Glucose, Bld: 244 mg/dL — ABNORMAL HIGH (ref 70–99)
Potassium: 3.9 mmol/L (ref 3.5–5.1)
Sodium: 135 mmol/L (ref 135–145)
Total Bilirubin: 0.8 mg/dL (ref 0.3–1.2)
Total Protein: 8.1 g/dL (ref 6.5–8.1)

## 2018-10-17 LAB — CBC
HCT: 45.2 % (ref 36.0–46.0)
Hemoglobin: 14.8 g/dL (ref 12.0–15.0)
MCH: 30.8 pg (ref 26.0–34.0)
MCHC: 32.7 g/dL (ref 30.0–36.0)
MCV: 94 fL (ref 80.0–100.0)
Platelets: 480 10*3/uL — ABNORMAL HIGH (ref 150–400)
RBC: 4.81 MIL/uL (ref 3.87–5.11)
RDW: 14.6 % (ref 11.5–15.5)
WBC: 21.2 10*3/uL — ABNORMAL HIGH (ref 4.0–10.5)
nRBC: 0 % (ref 0.0–0.2)

## 2018-10-17 LAB — C-REACTIVE PROTEIN: CRP: 2.5 mg/dL — ABNORMAL HIGH (ref <1.0)

## 2018-10-17 LAB — GLUCOSE, CAPILLARY
Glucose-Capillary: 219 mg/dL — ABNORMAL HIGH (ref 70–99)
Glucose-Capillary: 197 mg/dL — ABNORMAL HIGH (ref 70–99)
Glucose-Capillary: 232 mg/dL — ABNORMAL HIGH (ref 70–99)
Glucose-Capillary: 147 mg/dL — ABNORMAL HIGH (ref 70–99)

## 2018-10-17 LAB — ABO/RH: ABO/RH(D): A POS

## 2018-10-17 MED ORDER — SODIUM CHLORIDE 0.9% IV SOLUTION: Freq: Once | INTRAVENOUS | Status: DC

## 2018-10-17 NOTE — Progress Notes (Signed)
Heather, CN spoke with blood bank at [facility] in regards to pt's convalescent plasma and they stated that they do not currently have the plasma but would be getting some from another source but it could be a while. Dr Lake Bells is aware.

## 2018-10-17 NOTE — Progress Notes (Signed)
Spoke with pt's daughter and updated her while she also helped translate to the pt.

## 2018-10-17 NOTE — Research (Signed)
ISimonne Maffucci, MD, consented Subject Christina Clark (female, Date of Birth February 06, 1951, 68 y.o.) and with diagnosis of COVID-19, in the [facility] Expanded Access Program (EAP) Research Protocol for [employer] against COVID-19.  The consent took place under following circumstances.   Subject Capacity assessed by this investigator as:  Presence of adequate emotional and mental capacity to consent with normal ability to read and write.  Consent took place in the following setting(s):  In-room, face to face   The following were present for the consent process:  Investigator   A copy of the cover letter and signed consent document was provided to subject/LAR.  The original signed consent document has been placed in the subject's physical chart and will be scanned into the electronic medical record upon discharge.  Statement of acknowledgement that the following was discussed with the subject/LAR:    1) Discussed the purpose of the research and procedures  2) Discussed risks and benefits and uncertainties of study participation 3) Discussed subject's responsibilities  4) Discussed the measures in place to maintain subject's confidentiality while a participant on the trial  5) Discussed alternatives to study participation.   6) Discussed study participation is voluntary and that the subject's care would not be jeopardized if they declined participation in the study.   7) Discussed freedom to withdraw at any time.   8) All subject/LAR questions were answered to their satisfaction.   9) In case of emergency consent, investigator agreed to discuss with subject/LAR at earliest available opportunity when the subject stabilizes and/or LAR can be located.     Final Investigator Signature  Donnamae Jude [place], [place] [phone]   Date: 10/17/2018 and 11:57 AM

## 2018-10-17 NOTE — Progress Notes (Signed)
NAME:  Christina Clark, MRN:  [id number], DOB:  Dec 16, 1950, LOS: 7 ADMISSION DATE:  10/10/2018, CONSULTATION DATE:  10/11/2018 REFERRING MD:  Jarvis NewcomerGrunz, CHIEF COMPLAINT:  dyspnea   Brief History   68 year old morbidly obese female admitted for severe acute respiratory failure with hypoxemia due to COVID-19 pneumonia.  Past Medical History  Hypertension Morbid obesity Depression  Significant Hospital Events   Admission June 11  Consults:  Pulmonary and critical care medicine  Procedures:    Significant Diagnostic Tests:    Micro Data:  6/10 SARS COV-2 > positive 6/10 blood culture 6/10 urine culture  Antimicrobials:  6/12 Remdesivir > 6/15  6/11 Actemra x1 6/11 solumedrol  6/17 convalescent plasma >   Interim history/subjective:  Breathing has improved Still has no complaints of shortness of breath Oxygenation has improved Appetite is okay  Objective   Blood pressure 121/63, pulse 73, temperature 99 F (37.2 C), temperature source Axillary, resp. rate (!) 23, height 5\' 4"  (1.626 m), weight 108.7 kg, SpO2 (!) 89 %.    FiO2 (%):  [80 %-95 %] 80 %   Intake/Output Summary (Last 24 hours) at 10/17/2018 1158 Last data filed at 10/17/2018 0400 Gross per 24 hour  Intake 290 ml  Output 500 ml  Net -210 ml   Filed Weights   10/10/18 2300  Weight: 108.7 kg    Examination:  General:  Resting comfortably in chair HENT: NCAT OP clear PULM: Crackles bases B, normal effort CV: RRR, no mgr GI: BS+, soft, nontender MSK: normal bulk and tone Neuro: awake, alert, no distress, MAEW    Resolved Hospital Problem list     Assessment & Plan:  ARDS due to COVID-19 pneumonia: Oxygenation remains poor, however the patient does not have complaints of shortness of breath Decision for intubation should be based on change in mental status or physical evidence of ventilatory failure such as nasal flaring, accessory muscle use, paradoxical breathing Remain in ICU Continue  to administer heated high flow oxygen to maintain O2 saturation greater than 85% Out of bed to chair as able Prone positioning while resting in bed Decrease Solu-Medrol, plan 10 day course Agree with diuresis Plan for convalescent plasma administration today, I have spoken to the patient at length about this today and she understands the risks and benefits.  She also understands that she is participating in a clinical trial.  I also spoke to her daughter about this as well.  Both have reviewed the process, discussed with a family member who is a physician in [place] and they all feel that it is appropriate for her to receive this treatment.  Best practice:  Diet: diabetic diet Pain/Anxiety/Delirium protocol (if indicated): n/a VAP protocol (if indicated): n/a DVT prophylaxis: Lovenox per COVID protocol GI prophylaxis: n/a Glucose control: SSI Mobility: out of bed as tolerated Code Status: full Family Communication: I updated her daughter Byrd HesselbachMaria today at length by phone Disposition: remain in ICU  Labs   CBC: Recent Labs  Lab 10/10/18 1631 10/12/18 0538 10/15/18 0520 10/17/18 0500  WBC 14.9* 23.2* 16.0* 21.2*  NEUTROABS 12.7* 21.4*  --   --   HGB 12.6 12.7 13.7 14.8  HCT 37.5 39.0 42.6 45.2  MCV 91.7 93.3 93.8 94.0  PLT 327 421* 501* 480*    Basic Metabolic Panel: Recent Labs  Lab 10/13/18 0550 10/14/18 0535 10/15/18 0520 10/16/18 0530 10/17/18 0500  NA 136 136 136 137 135  K 3.7 3.8 4.0 4.1 3.9  CL 98 95* 98 100  96*  CO2 25 26 26 24 23   GLUCOSE 282* 284* 244* 224* 244*  BUN 57* 56* 50* 50* 55*  CREATININE 0.95 1.08* 0.99 0.99 1.13*  CALCIUM 9.2 9.4 9.1 8.9 9.2   GFR: Estimated Creatinine Clearance: 59 mL/min (A) (by C-G formula based on SCr of 1.13 mg/dL (H)). Recent Labs  Lab 10/10/18 1631 10/11/18 0550 10/12/18 0538 10/15/18 0520 10/17/18 0500  PROCALCITON  --  0.10  --   --   --   WBC 14.9*  --  23.2* 16.0* 21.2*  LATICACIDVEN 1.2  --   --   --   --      Liver Function Tests: Recent Labs  Lab 10/13/18 0550 10/14/18 0535 10/15/18 0520 10/16/18 0530 10/17/18 0500  AST 17 16 18 23  53*  ALT 42 41 38 40 68*  ALKPHOS 49 48 45 41 40  BILITOT 0.5 0.4 0.4 0.8 0.8  PROT 8.7* 8.9* 8.1 7.7 8.1  ALBUMIN 3.2* 3.3* 3.3* 3.1* 3.3*   No results for input(s): LIPASE, AMYLASE in the last 168 hours. No results for input(s): AMMONIA in the last 168 hours.  ABG    Component Value Date/Time   PHART 7.52 (H) 10/10/2018 1647   PCO2ART 34 10/10/2018 1647   PO2ART 53 (L) 10/10/2018 1647   HCO3 27.8 10/10/2018 1647   O2SAT 90.6 10/10/2018 1647     Coagulation Profile: No results for input(s): INR, PROTIME in the last 168 hours.  Cardiac Enzymes: Recent Labs  Lab 10/11/18 0550 10/11/18 1140  TROPONINI <0.03 <0.03    HbA1C: Hgb A1c MFr Bld  Date/Time Value Ref Range Status  10/11/2018 05:50 AM 8.1 (H) 4.8 - 5.6 % Final    Comment:    (NOTE) Pre diabetes:          5.7%-6.4% Diabetes:              >6.4% Glycemic control for   <7.0% adults with diabetes     CBG: Recent Labs  Lab 10/16/18 1153 10/16/18 1627 10/16/18 2105 10/17/18 0819 10/17/18 1117  GLUCAP 226* 198* 177* 219* 197*     Critical care time: 32 minutes       Roselie Awkward, MD [place] [facility] Pager: [phone] Cell: [phone] If no response, call [phone]

## 2018-10-17 NOTE — Progress Notes (Signed)
Inpatient Diabetes Program Recommendations  AACE/ADA: New Consensus Statement on Inpatient Glycemic Control (2015)  Target Ranges:  Prepandial:   less than 140 mg/dL      Peak postprandial:   less than 180 mg/dL (1-2 hours)      Critically ill patients:  140 - 180 mg/dL   Lab Results  Component Value Date   GLUCAP 219 (H) 10/17/2018   HGBA1C 8.1 (H) 10/11/2018    Review of Glycemic Control  Diabetes history: None - New-onset Outpatient Diabetes medications: N/A Current orders for Inpatient glycemic control: levemir 25 units QHS, Novolog 0-20 units tidwc and hs + 4 units tidwc  Solumedrol 40 mg Q12H - complete HgbA1C - 8.1% - new diagnosis of diabetes  Spoke with pt with assistance of interpreter on [employer] regarding her new onset diabetes diagnosis. Discussed HgbA1C of 8.1% and goal of 7%. Explained to pt that losing just a small amount of weight would allow her body to use her own insulin much better. Discussed importance of diet and exercise, monitoring daily and taking logbook to PCP for review. MD will make adjustments with meds based on how blood sugars are trending at home. Agree with MD that pt should go home on metformin, along with diet and exercise, to control blood sugars. Discussed in detail reducing portion sizes, limiting CHO rich foods such as soda, sweet tea, cookies, cakes, candy. Suggested pt keep food diary each day to help with making changes to her diet. Stressed that exercise will also help with controlling blood sugars. Pt has lots of opportunities to make a difference in blood sugars with diet and exercise. Pt voiced understanding. .  Inpatient Diabetes Program Recommendations:     Blood sugars should improve with discontinuation of steroids. Recommend metformin 500 mg bid for home Will need glucose meter and supplies for monitoring at home. If blood sugars > 180 mg/dL, PCP may start pt on low-dose basal insulin (Pt does not have insurane, therefore, will need  affordable insulin such as NPH or Novolin 70/30. Can get at [facility] for $25)  First-line therapy for new onset DM is metformin + lifestyle modification.  Will continue to follow.   Thank you. Lorenda Peck, RD, LDN, CDE Inpatient Diabetes Coordinator [phone]

## 2018-10-17 NOTE — Progress Notes (Signed)
PROGRESS NOTE  Avigayil Ton  NAT:[id number] DOB: [birth year] DOA: 10/10/2018 PCP: Patient, No Pcp Per   Brief Narrative: 68 year old Spanish-speaking female with past medical history of morbid obesity, hypertension and depression admitted to the hospitalist service on [place] presenting to the emergency room with 1 week of fever, cough and shortness of breath in the setting of known COVID-19 contacts.  Patient found to be COVID positive as well as hypoxic with bilateral infiltrates on chest x-ray.  Requiring oxygen support of high flow.  Started on Remdisivir, steroids and given Actemra x1.  No history of diabetes, but noted to be hyperglycemic.  A1c checked and patient found to have A1c of 8.5, and started on low-dose Levemir plus sliding scale. Inflammatory markers have decreased, though she continues to require high levels of oxygen.  Assessment & Plan: Principal Problem:   Acute respiratory failure with hypoxia (HCC) Active Problems:   Hypertension   Depression   Acute respiratory disease due to COVID-19 virus   Morbid obesity with BMI of 40.0-44.9, adult (HCC)   Diabetes mellitus type 2, uncontrolled ([place])  Acute respiratory failure with hypoxia due to COVID-19 pneumonia: s/p actemra x1 dose - Completed 5 days remdesivir (6/11 - 6/15) -Course of steroids will be completed tonight -Plans are to give the patient convalescent plasma today. - Monitor I/O maintain even/negative balance. Give lasix prn. - Avoid NSAIDs - Continue airborne/droplet precautions.  -She continues to require heated high flow with FiO2 of 80% O2 flow rate of 50 L/min. - Continue supplemental oxygen and wean down as tolerated. Remains "happily hypoxic." Continue proning and mobilizing.  Hypertension: Blood pressure stable.   - Continue home ARB and HCTZ.  Depression: Having a significant amount of anxiety while hospitalized.  - Continue scheduled low dose xanax started while inpatient. -  Continue home zoloft  Morbid obesity: BMI 41.   T2DM, uncontrolled with hyperglycemia: HbA1c of 8.5%. Not on medications at home.  - Continue to titrate insulin, increase levemir further today to 25u qHS, continue mealtime insulin today. Will likely discharge on oral medications with PCP follow up once clinically improving.   Leukocytosis: Suspect stress demargination and steroid effect.  - Monitor for secondary infections.  DVT prophylaxis: Lovenox Code Status: Full Family Communication: Daughter by phone per PCCM Disposition Plan: Remain in ICU for close monitoring  Consultants:   PCCM  Procedures:   None  Antimicrobials:  Remdesivir 6/11 - 6/15  Azithromycin 6/10   Subjective: Denies any shortness of breath.  Appetite is okay.  Objective: Vitals:   10/17/18 1500 10/17/18 1600 10/17/18 1630 10/17/18 1700  BP: 106/61 (!) 89/64 (!) 89/64   Pulse: 73 74 83 85  Resp: 19 (!) 25 (!) 31 (!) 27  Temp:  98.7 F (37.1 C)    TempSrc:  Oral    SpO2: (!) 86% (!) 88% (!) 73% (!) 74%  Weight:      Height:        Intake/Output Summary (Last 24 hours) at 10/17/2018 1743 Last data filed at 10/17/2018 1333 Gross per 24 hour  Intake 440 ml  Output 750 ml  Net -310 ml   Filed Weights   10/10/18 2300  Weight: 108.7 kg   General exam: Alert, awake, oriented x 3 Respiratory system: Clear to auscultation. Respiratory effort normal. Cardiovascular system:RRR. No murmurs, rubs, gallops. Gastrointestinal system: Abdomen is nondistended, soft and nontender. No organomegaly or masses felt. Normal bowel sounds heard. Central nervous system: Alert and oriented. No focal neurological  deficits. Extremities: No C/C/E, +pedal pulses Skin: No rashes, lesions or ulcers Psychiatry: Judgement and insight appear normal. Mood & affect appropriate.    Data Reviewed: I have personally reviewed following labs and imaging studies  CBC: Recent Labs  Lab 10/12/18 0538 10/15/18 0520  10/17/18 0500  WBC 23.2* 16.0* 21.2*  NEUTROABS 21.4*  --   --   HGB 12.7 13.7 14.8  HCT 39.0 42.6 45.2  MCV 93.3 93.8 94.0  PLT 421* 501* 480*   Basic Metabolic Panel: Recent Labs  Lab 10/13/18 0550 10/14/18 0535 10/15/18 0520 10/16/18 0530 10/17/18 0500  NA 136 136 136 137 135  K 3.7 3.8 4.0 4.1 3.9  CL 98 95* 98 100 96*  CO2 25 26 26 24 23   GLUCOSE 282* 284* 244* 224* 244*  BUN 57* 56* 50* 50* 55*  CREATININE 0.95 1.08* 0.99 0.99 1.13*  CALCIUM 9.2 9.4 9.1 8.9 9.2   GFR: Estimated Creatinine Clearance: 59 mL/min (A) (by C-G formula based on SCr of 1.13 mg/dL (H)). Liver Function Tests: Recent Labs  Lab 10/13/18 0550 10/14/18 0535 10/15/18 0520 10/16/18 0530 10/17/18 0500  AST 17 16 18 23  53*  ALT 42 41 38 40 68*  ALKPHOS 49 48 45 41 40  BILITOT 0.5 0.4 0.4 0.8 0.8  PROT 8.7* 8.9* 8.1 7.7 8.1  ALBUMIN 3.2* 3.3* 3.3* 3.1* 3.3*   No results for input(s): LIPASE, AMYLASE in the last 168 hours. No results for input(s): AMMONIA in the last 168 hours. Coagulation Profile: No results for input(s): INR, PROTIME in the last 168 hours. Cardiac Enzymes: Recent Labs  Lab 10/11/18 0550 10/11/18 1140  TROPONINI <0.03 <0.03   BNP (last 3 results) No results for input(s): PROBNP in the last 8760 hours. HbA1C: No results for input(s): HGBA1C in the last 72 hours. CBG: Recent Labs  Lab 10/16/18 1627 10/16/18 2105 10/17/18 0819 10/17/18 1117 10/17/18 1618  GLUCAP 198* 177* 219* 197* 232*   Lipid Profile: No results for input(s): CHOL, HDL, LDLCALC, TRIG, CHOLHDL, LDLDIRECT in the last 72 hours. Thyroid Function Tests: No results for input(s): TSH, T4TOTAL, FREET4, T3FREE, THYROIDAB in the last 72 hours. Anemia Panel: Recent Labs    10/15/18 0520  FERRITIN 627*   Urine analysis:    Component Value Date/Time   COLORURINE YELLOW (A) 10/10/2018 2039   APPEARANCEUR CLOUDY (A) 10/10/2018 2039   LABSPEC 1.020 10/10/2018 2039   PHURINE 5.0 10/10/2018 2039    GLUCOSEU NEGATIVE 10/10/2018 2039   HGBUR NEGATIVE 10/10/2018 2039   BILIRUBINUR NEGATIVE 10/10/2018 2039   KETONESUR NEGATIVE 10/10/2018 2039   PROTEINUR 30 (A) 10/10/2018 2039   NITRITE NEGATIVE 10/10/2018 2039   LEUKOCYTESUR TRACE (A) 10/10/2018 2039   Recent Results (from the past 240 hour(s))  Blood Culture (routine x 2)     Status: None   Collection Time: 10/10/18  4:31 PM   Specimen: BLOOD  Result Value Ref Range Status   Specimen Description BLOOD LEFT ANTECUBITAL  Final   Special Requests   Final    BOTTLES DRAWN AEROBIC AND ANAEROBIC Blood Culture adequate volume   Culture   Final    NO GROWTH 5 DAYS Performed at [facility], [address] [place], [place] [address]    Report Status 10/17/2018 FINAL  Final  Blood Culture (routine x 2)     Status: None   Collection Time: 10/10/18  4:31 PM   Specimen: BLOOD  Result Value Ref Range Status   Specimen Description BLOOD  RIGHT ANTECUBITAL  Final   Special Requests   Final    BOTTLES DRAWN AEROBIC AND ANAEROBIC Blood Culture results may not be optimal due to an excessive volume of blood received in culture bottles   Culture   Final    NO GROWTH 5 DAYS Performed at [facility], [address] [place], [place] [address]    Report Status 10/17/2018 FINAL  Final  SARS Coronavirus 2 (CEPHEID- Performed in [facility] lab), Hosp Order     Status: Abnormal   Collection Time: 10/10/18  4:47 PM   Specimen: Nasopharyngeal Swab  Result Value Ref Range Status   SARS Coronavirus 2 POSITIVE (A) NEGATIVE Final    Comment: RESULT CALLED TO, READ BACK BY AND VERIFIED WITH: TOM NAGY 10/10/18 @ 1748  MLK (NOTE) If result is NEGATIVE SARS-CoV-2 target nucleic acids are NOT DETECTED. The SARS-CoV-2 RNA is generally detectable in upper and lower  respiratory specimens during the acute phase of infection. The lowest  concentration of SARS-CoV-2 viral copies this assay can detect is 250  copies / mL. A  negative result does not preclude SARS-CoV-2 infection  and should not be used as the sole basis for treatment or other  patient management decisions.  A negative result may occur with  improper specimen collection / handling, submission of specimen other  than nasopharyngeal swab, presence of viral mutation(s) within the  areas targeted by this assay, and inadequate number of viral copies  (<250 copies / mL). A negative result must be combined with clinical  observations, patient history, and epidemiological information. If result is POSITIVE SARS-CoV-2 target nucleic acids are DETECTED. The SARS -CoV-2 RNA is generally detectable in upper and lower  respiratory specimens during the acute phase of infection.  Positive  results are indicative of active infection with SARS-CoV-2.  Clinical  correlation with patient history and other diagnostic information is  necessary to determine patient infection status.  Positive results do  not rule out bacterial infection or co-infection with other viruses. If result is PRESUMPTIVE POSTIVE SARS-CoV-2 nucleic acids MAY BE PRESENT.   A presumptive positive result was obtained on the submitted specimen  and confirmed on repeat testing.  While 2019 novel coronavirus  (SARS-CoV-2) nucleic acids may be present in the submitted sample  additional confirmatory testing may be necessary for epidemiological  and / or clinical management purposes  to differentiate between  SARS-CoV-2 and other Sarbecovirus currently known to infect humans.  If clinically indicated additional testing with an alternate test  methodology [phone] is advise d. The SARS-CoV-2 RNA is generally  detectable in upper and lower respiratory specimens during the acute  phase of infection. The expected result is Negative. Fact Sheet for Patients:  BoilerBrush.com.cyhttps://www.fda.gov/media/136312/download Fact Sheet for Healthcare Providers: https://pope.com/https://www.fda.gov/media/136313/download This test is not  yet approved or cleared by the [place] FDA and has been authorized for detection and/or diagnosis of SARS-CoV-2 by FDA under an Emergency Use Authorization (EUA).  This EUA will remain in effect (meaning this test can be used) for the duration of the COVID-19 declaration under Section 564(b)(1) of the Act, 21 U.S.C. section 360bbb-3(b)(1), unless the authorization is terminated or revoked sooner. Performed at [facility], [address] [place], [place] [address]   Urine culture     Status: None   Collection Time: 10/10/18  8:39 PM   Specimen: Urine, Random  Result Value Ref Range Status   Specimen Description   Final    URINE, RANDOM Performed  at [facility], [address] [place], [place] [address]    Special Requests   Final    Normal Performed at [facility], [address] [place], [place] [address]    Culture   Final    Multiple bacterial morphotypes present, none predominant. Suggest appropriate recollection if clinically indicated.   Report Status 10/12/2018 FINAL  Final  MRSA PCR Screening     Status: None   Collection Time: 10/16/18  4:40 PM   Specimen: Nasal Mucosa; Nasopharyngeal  Result Value Ref Range Status   MRSA by PCR NEGATIVE NEGATIVE Final    Comment:        The GeneXpert MRSA Assay (FDA approved for NASAL specimens only), is one component of a comprehensive MRSA colonization surveillance program. It is not intended to diagnose MRSA infection nor to guide or monitor treatment for MRSA infections. Performed at [facility], [address] [place], [place] [address]       Radiology Studies: No results found.  Scheduled Meds: . sodium chloride   Intravenous Once  . ALPRAZolam  0.25 mg Oral Daily  . Chlorhexidine Gluconate Cloth  6 each Topical Q0600  . enoxaparin (LOVENOX) injection  0.5 mg/kg Subcutaneous Q12H  . hydrochlorothiazide  25 mg Oral Daily  . insulin aspart  0-20 Units  Subcutaneous TID WC  . insulin aspart  0-5 Units Subcutaneous QHS  . insulin aspart  4 Units Subcutaneous TID WC  . insulin detemir  25 Units Subcutaneous QHS  . losartan  50 mg Oral Daily  . sertraline  50 mg Oral Daily  . sodium chloride flush  3 mL Intravenous Q12H   Continuous Infusions: . sodium chloride Stopped (10/12/18 1042)     LOS: 7 days   Critical care Time spent: 35 minutes.  Erick BlinksJehanzeb Braelin Costlow, MD [facility] [email or web address] Password [email or web address] 10/17/2018, 5:43 PM

## 2018-10-18 DIAGNOSIS — N179 Acute kidney failure, unspecified: Secondary | ICD-10-CM

## 2018-10-18 LAB — GLUCOSE, CAPILLARY
Glucose-Capillary: 157 mg/dL — ABNORMAL HIGH (ref 70–99)
Glucose-Capillary: 206 mg/dL — ABNORMAL HIGH (ref 70–99)
Glucose-Capillary: 92 mg/dL (ref 70–99)
Glucose-Capillary: 93 mg/dL (ref 70–99)

## 2018-10-18 LAB — COMPREHENSIVE METABOLIC PANEL
ALT: 62 U/L — ABNORMAL HIGH (ref 0–44)
AST: 33 U/L (ref 15–41)
Albumin: 3.3 g/dL — ABNORMAL LOW (ref 3.5–5.0)
Alkaline Phosphatase: 42 U/L (ref 38–126)
Anion gap: 19 — ABNORMAL HIGH (ref 5–15)
BUN: 77 mg/dL — ABNORMAL HIGH (ref 8–23)
CO2: 18 mmol/L — ABNORMAL LOW (ref 22–32)
Calcium: 9.1 mg/dL (ref 8.9–10.3)
Chloride: 97 mmol/L — ABNORMAL LOW (ref 98–111)
Creatinine, Ser: 1.73 mg/dL — ABNORMAL HIGH (ref 0.44–1.00)
GFR calc Af Amer: 35 mL/min — ABNORMAL LOW (ref >60)
GFR calc non Af Amer: 30 mL/min — ABNORMAL LOW (ref >60)
Glucose, Bld: 152 mg/dL — ABNORMAL HIGH (ref 70–99)
Potassium: 3.9 mmol/L (ref 3.5–5.1)
Sodium: 134 mmol/L — ABNORMAL LOW (ref 135–145)
Total Bilirubin: 0.7 mg/dL (ref 0.3–1.2)
Total Protein: 7.6 g/dL (ref 6.5–8.1)

## 2018-10-18 LAB — CBC
HCT: 46.8 % — ABNORMAL HIGH (ref 36.0–46.0)
Hemoglobin: 15.5 g/dL — ABNORMAL HIGH (ref 12.0–15.0)
MCH: 31.1 pg (ref 26.0–34.0)
MCHC: 33.1 g/dL (ref 30.0–36.0)
MCV: 93.8 fL (ref 80.0–100.0)
Platelets: 526 10*3/uL — ABNORMAL HIGH (ref 150–400)
RBC: 4.99 MIL/uL (ref 3.87–5.11)
RDW: 14.9 % (ref 11.5–15.5)
WBC: 20.6 10*3/uL — ABNORMAL HIGH (ref 4.0–10.5)
nRBC: 0 % (ref 0.0–0.2)

## 2018-10-18 LAB — C-REACTIVE PROTEIN: CRP: 1 mg/dL — ABNORMAL HIGH (ref <1.0)

## 2018-10-18 LAB — D-DIMER, QUANTITATIVE: D-Dimer, Quant: 0.51 ug/mL-FEU — ABNORMAL HIGH (ref 0.00–0.50)

## 2018-10-18 LAB — LACTATE DEHYDROGENASE: LDH: 386 U/L — ABNORMAL HIGH (ref 98–192)

## 2018-10-18 LAB — FERRITIN: Ferritin: 805 ng/mL — ABNORMAL HIGH (ref 11–307)

## 2018-10-18 MED ORDER — SODIUM CHLORIDE 0.9 % IV SOLN
INTRAVENOUS | Status: AC
  Administered 2018-10-18: 15:00:00 via INTRAVENOUS

## 2018-10-18 MED ORDER — SALINE SPRAY 0.65 % NA SOLN
1.0000 | NASAL | Status: DC | PRN
  Filled 2018-10-18 (×2): qty 44

## 2018-10-18 NOTE — Progress Notes (Signed)
Unable to get urine specimen at this time, urine unfortunately spilled in floor. Will attempt to get specimen later.

## 2018-10-18 NOTE — Progress Notes (Signed)
Alert and oriented x 4. Remains afebrile. Ambulated from chair to bed. Now in bed (supine). O2 SAT 92-95%. Continues on HFNC at 30 liters 80% fiO2. On fluid hydration NS 0.9% at 100 ml/hr. Call bell within reach. Update given to daughter. Safety precautions maintained.

## 2018-10-18 NOTE — Progress Notes (Signed)
NAME:  Christina Clark, MRN:  [id number], DOB:  Apr 01, 1951, LOS: 8 ADMISSION DATE:  10/10/2018, CONSULTATION DATE:  10/11/2018 REFERRING MD:  Jarvis NewcomerGrunz, CHIEF COMPLAINT:  dyspnea   Brief History   68 year old morbidly obese female admitted for severe acute respiratory failure with hypoxemia due to COVID-19 pneumonia.  Past Medical History  Hypertension Morbid obesity Depression  Significant Hospital Events   Admission June 11  Consults:  Pulmonary and critical care medicine  Procedures:    Significant Diagnostic Tests:    Micro Data:  6/10 SARS COV-2 > positive 6/10 blood culture 6/10 urine culture  Antimicrobials:  6/12 Remdesivir > 6/15  6/11 Actemra x1 6/11 solumedrol  6/17 convalescent plasma >   Interim history/subjective:   Some hypoxemia when sitting up, eating today, was okay in prone position on 90% FiO2, 40 to 50 L nasal cannula Complains of sinus congestion Denies cough or chest congestion  Objective   Blood pressure (!) 103/54, pulse 73, temperature 98 F (36.7 C), temperature source Axillary, resp. rate 20, height 5\' 4"  (1.626 m), weight 108.7 kg, SpO2 92 %.    FiO2 (%):  [90 %-100 %] 100 %   Intake/Output Summary (Last 24 hours) at 10/18/2018 0820 Last data filed at 10/17/2018 1800 Gross per 24 hour  Intake 200 ml  Output 350 ml  Net -150 ml   Filed Weights   10/10/18 2300  Weight: 108.7 kg    Examination:  General:  Resting comfortably in chair HENT: NCAT OP clear PULM: CTA B, normal effort CV: RRR, no mgr GI: BS+, soft, nontender MSK: normal bulk and tone Neuro: awake, alert, no distress, MAEW   Resolved Hospital Problem list     Assessment & Plan:  ARDS due to COVID-19 pneumonia: Oxygenation remains poor, however the patient does not have complaints of shortness of breath Continue to monitor closely in intensive care unit Tolerate periods of hypoxemia, goal at rest is greater than 85% SaO2, with movement ideally above 75%  Decision for intubation should be based on a change in mental status or physical evidence of ventilatory failure such as nasal flaring, accessory muscle use, paradoxical breathing Out of bed to chair as able Prone positioning while in bed Continue to administer heated high flow oxygen, weaning off FiO2 as able Continue to look for convalescent plasma to administer  Best practice:  Diet: diabetic diet Pain/Anxiety/Delirium protocol (if indicated): n/a VAP protocol (if indicated): n/a DVT prophylaxis: Lovenox per COVID protocol GI prophylaxis: n/a Glucose control: SSI Mobility: out of bed as tolerated Code Status: full Family Communication: I updated her daughter Byrd HesselbachMaria today at length by phone Disposition: remain in ICU  Labs   CBC: Recent Labs  Lab 10/12/18 0538 10/15/18 0520 10/17/18 0500 10/18/18 0630  WBC 23.2* 16.0* 21.2* 20.6*  NEUTROABS 21.4*  --   --   --   HGB 12.7 13.7 14.8 15.5*  HCT 39.0 42.6 45.2 46.8*  MCV 93.3 93.8 94.0 93.8  PLT 421* 501* 480* 526*    Basic Metabolic Panel: Recent Labs  Lab 10/13/18 0550 10/14/18 0535 10/15/18 0520 10/16/18 0530 10/17/18 0500  NA 136 136 136 137 135  K 3.7 3.8 4.0 4.1 3.9  CL 98 95* 98 100 96*  CO2 25 26 26 24 23   GLUCOSE 282* 284* 244* 224* 244*  BUN 57* 56* 50* 50* 55*  CREATININE 0.95 1.08* 0.99 0.99 1.13*  CALCIUM 9.2 9.4 9.1 8.9 9.2   GFR: Estimated Creatinine Clearance: 59 mL/min (A) (by  C-G formula based on SCr of 1.13 mg/dL (H)). Recent Labs  Lab 10/12/18 0538 10/15/18 0520 10/17/18 0500 10/18/18 0630  WBC 23.2* 16.0* 21.2* 20.6*    Liver Function Tests: Recent Labs  Lab 10/13/18 0550 10/14/18 0535 10/15/18 0520 10/16/18 0530 10/17/18 0500  AST 17 16 18 23  53*  ALT 42 41 38 40 68*  ALKPHOS 49 48 45 41 40  BILITOT 0.5 0.4 0.4 0.8 0.8  PROT 8.7* 8.9* 8.1 7.7 8.1  ALBUMIN 3.2* 3.3* 3.3* 3.1* 3.3*   No results for input(s): LIPASE, AMYLASE in the last 168 hours. No results for input(s):  AMMONIA in the last 168 hours.  ABG    Component Value Date/Time   PHART 7.52 (H) 10/10/2018 1647   PCO2ART 34 10/10/2018 1647   PO2ART 53 (L) 10/10/2018 1647   HCO3 27.8 10/10/2018 1647   O2SAT 90.6 10/10/2018 1647     Coagulation Profile: No results for input(s): INR, PROTIME in the last 168 hours.  Cardiac Enzymes: Recent Labs  Lab 10/11/18 1140  TROPONINI <0.03    HbA1C: Hgb A1c MFr Bld  Date/Time Value Ref Range Status  10/11/2018 05:50 AM 8.1 (H) 4.8 - 5.6 % Final    Comment:    (NOTE) Pre diabetes:          5.7%-6.4% Diabetes:              >6.4% Glycemic control for   <7.0% adults with diabetes     CBG: Recent Labs  Lab 10/17/18 0819 10/17/18 1117 10/17/18 1618 10/17/18 2113 10/18/18 0725  GLUCAP 219* 197* 232* 147* 157*     Critical care time: 31 minutes       Roselie Awkward, MD [place] [facility] Pager: [phone] Cell: [phone] If no response, call [phone]

## 2018-10-18 NOTE — Progress Notes (Signed)
PROGRESS NOTE  Christina Clark  OJJ:[id number] DOB: [birth year] DOA: 10/10/2018 PCP: Patient, No Pcp Per   Brief Narrative: 68 year old Spanish-speaking female with past medical history of morbid obesity, hypertension and depression admitted to the hospitalist service on [place] presenting to the emergency room with 1 week of fever, cough and shortness of breath in the setting of known COVID-19 contacts.  Patient found to be COVID positive as well as hypoxic with bilateral infiltrates on chest x-ray.  Requiring oxygen support of high flow.  Started on Remdisivir, steroids and given Actemra x1.  No history of diabetes, but noted to be hyperglycemic.  A1c checked and patient found to have A1c of 8.5, and started on low-dose Levemir plus sliding scale. Inflammatory markers have decreased, though she continues to require high levels of oxygen.  Assessment & Plan: Principal Problem:   Acute respiratory failure with hypoxia (HCC) Active Problems:   Hypertension   Depression   Acute respiratory disease due to COVID-19 virus   Morbid obesity with BMI of 40.0-44.9, adult (HCC)   Diabetes mellitus type 2, uncontrolled ([place])  Acute respiratory failure with hypoxia due to COVID-19 pneumonia: s/p actemra x1 dose - Completed 5 days remdesivir (6/11 - 6/15) -Course of steroids will be completed tonight -Plans are to give the patient convalescent plasma today. - Monitor I/O maintain even/negative balance. Give lasix prn. - Avoid NSAIDs - Continue airborne/droplet precautions.  -She continues to require heated high flow with FiO2 of 90% O2 flow rate of 50 L/min. - Continue supplemental oxygen and wean down as tolerated. Remains "happily hypoxic." Continue proning and mobilizing.  Acute kidney injury.  Creatinine has bumped up to 1.7 today.  Urine output recorded yesterday was 350 cc.  Will hold hydrochlorothiazide and ARB for now.  Since blood pressures are soft, will provide gentle  hydration for the next 12 hours.  Hypertension: Blood pressures running low.  Hold antihypertensives  Depression: Having a significant amount of anxiety while hospitalized.  - Continue scheduled low dose xanax started while inpatient. - Continue home zoloft  Morbid obesity: BMI 41.   T2DM, uncontrolled with hyperglycemia: HbA1c of 8.5%. Not on medications at home.  - Continue to titrate insulin, increase levemir further today to 25u qHS, continue mealtime insulin today. Will likely discharge on oral medications with PCP follow up once clinically improving.   Leukocytosis: Suspect stress demargination and steroid effect.  - Monitor for secondary infections.  DVT prophylaxis: Lovenox Code Status: Full Family Communication: Daughter by phone per PCCM Disposition Plan: Remain in ICU for close monitoring  Consultants:   PCCM  Procedures:   None  Antimicrobials:  Remdesivir 6/11 - 6/15  Azithromycin 6/10   Subjective: Denies any shortness of breath or cough.  She does complain of dryness in her nose  Objective: Vitals:   10/18/18 1000 10/18/18 1100 10/18/18 1200 10/18/18 1208  BP: (!) 92/53 92/67 (!) 91/39 92/67  Pulse: 91 81 83 70  Resp: (!) 28 20 (!) 23 (!) 24  Temp:   98.2 F (36.8 C)   TempSrc:   Axillary   SpO2: (!) 81% 94% 91% 93%  Weight:      Height:        Intake/Output Summary (Last 24 hours) at 10/18/2018 1340 Last data filed at 10/18/2018 0925 Gross per 24 hour  Intake 3 ml  Output 100 ml  Net -97 ml   Filed Weights   10/10/18 2300  Weight: 108.7 kg   General exam: Alert, awake,  oriented x 3 Respiratory system: Clear to auscultation. Respiratory effort normal. Cardiovascular system:RRR. No murmurs, rubs, gallops. Gastrointestinal system: Abdomen is nondistended, soft and nontender. No organomegaly or masses felt. Normal bowel sounds heard. Central nervous system: Alert and oriented. No focal neurological deficits. Extremities: No C/C/E,  +pedal pulses Skin: No rashes, lesions or ulcers Psychiatry: Judgement and insight appear normal. Mood & affect appropriate.     Data Reviewed: I have personally reviewed following labs and imaging studies  CBC: Recent Labs  Lab 10/12/18 0538 10/15/18 0520 10/17/18 0500 10/18/18 0630  WBC 23.2* 16.0* 21.2* 20.6*  NEUTROABS 21.4*  --   --   --   HGB 12.7 13.7 14.8 15.5*  HCT 39.0 42.6 45.2 46.8*  MCV 93.3 93.8 94.0 93.8  PLT 421* 501* 480* 526*   Basic Metabolic Panel: Recent Labs  Lab 10/14/18 0535 10/15/18 0520 10/16/18 0530 10/17/18 0500 10/18/18 0630  NA 136 136 137 135 134*  K 3.8 4.0 4.1 3.9 3.9  CL 95* 98 100 96* 97*  CO2 26 26 24 23  18*  GLUCOSE 284* 244* 224* 244* 152*  BUN 56* 50* 50* 55* 77*  CREATININE 1.08* 0.99 0.99 1.13* 1.73*  CALCIUM 9.4 9.1 8.9 9.2 9.1   GFR: Estimated Creatinine Clearance: 38.5 mL/min (A) (by C-G formula based on SCr of 1.73 mg/dL (H)). Liver Function Tests: Recent Labs  Lab 10/14/18 0535 10/15/18 0520 10/16/18 0530 10/17/18 0500 10/18/18 0630  AST 16 18 23  53* 33  ALT 41 38 40 68* 62*  ALKPHOS 48 45 41 40 42  BILITOT 0.4 0.4 0.8 0.8 0.7  PROT 8.9* 8.1 7.7 8.1 7.6  ALBUMIN 3.3* 3.3* 3.1* 3.3* 3.3*   No results for input(s): LIPASE, AMYLASE in the last 168 hours. No results for input(s): AMMONIA in the last 168 hours. Coagulation Profile: No results for input(s): INR, PROTIME in the last 168 hours. Cardiac Enzymes: No results for input(s): CKTOTAL, CKMB, CKMBINDEX, TROPONINI in the last 168 hours. BNP (last 3 results) No results for input(s): PROBNP in the last 8760 hours. HbA1C: No results for input(s): HGBA1C in the last 72 hours. CBG: Recent Labs  Lab 10/17/18 1117 10/17/18 1618 10/17/18 2113 10/18/18 0725 10/18/18 1230  GLUCAP 197* 232* 147* 157* 206*   Lipid Profile: No results for input(s): CHOL, HDL, LDLCALC, TRIG, CHOLHDL, LDLDIRECT in the last 72 hours. Thyroid Function Tests: No results for  input(s): TSH, T4TOTAL, FREET4, T3FREE, THYROIDAB in the last 72 hours. Anemia Panel: No results for input(s): VITAMINB12, FOLATE, FERRITIN, TIBC, IRON, RETICCTPCT in the last 72 hours. Urine analysis:    Component Value Date/Time   COLORURINE YELLOW (A) 10/10/2018 2039   APPEARANCEUR CLOUDY (A) 10/10/2018 2039   LABSPEC 1.020 10/10/2018 2039   PHURINE 5.0 10/10/2018 2039   GLUCOSEU NEGATIVE 10/10/2018 2039   HGBUR NEGATIVE 10/10/2018 2039   BILIRUBINUR NEGATIVE 10/10/2018 2039   KETONESUR NEGATIVE 10/10/2018 2039   PROTEINUR 30 (A) 10/10/2018 2039   NITRITE NEGATIVE 10/10/2018 2039   LEUKOCYTESUR TRACE (A) 10/10/2018 2039   Recent Results (from the past 240 hour(s))  Blood Culture (routine x 2)     Status: None   Collection Time: 10/10/18  4:31 PM   Specimen: BLOOD  Result Value Ref Range Status   Specimen Description BLOOD LEFT ANTECUBITAL  Final   Special Requests   Final    BOTTLES DRAWN AEROBIC AND ANAEROBIC Blood Culture adequate volume   Culture   Final    NO GROWTH 5 DAYS  Performed at [facility], [address] [place], [place] [address]    Report Status 10/17/2018 FINAL  Final  Blood Culture (routine x 2)     Status: None   Collection Time: 10/10/18  4:31 PM   Specimen: BLOOD  Result Value Ref Range Status   Specimen Description BLOOD RIGHT ANTECUBITAL  Final   Special Requests   Final    BOTTLES DRAWN AEROBIC AND ANAEROBIC Blood Culture results may not be optimal due to an excessive volume of blood received in culture bottles   Culture   Final    NO GROWTH 5 DAYS Performed at [facility], [address] [place], [place] [address]    Report Status 10/17/2018 FINAL  Final  SARS Coronavirus 2 (CEPHEID- Performed in [facility] hospital lab), Hosp Order     Status: Abnormal   Collection Time: 10/10/18  4:47 PM   Specimen: Nasopharyngeal Swab  Result Value Ref Range Status   SARS Coronavirus 2 POSITIVE (A) NEGATIVE Final    Comment: RESULT  CALLED TO, READ BACK BY AND VERIFIED WITH: TOM NAGY 10/10/18 @ 1748  MLK (NOTE) If result is NEGATIVE SARS-CoV-2 target nucleic acids are NOT DETECTED. The SARS-CoV-2 RNA is generally detectable in upper and lower  respiratory specimens during the acute phase of infection. The lowest  concentration of SARS-CoV-2 viral copies this assay can detect is 250  copies / mL. A negative result does not preclude SARS-CoV-2 infection  and should not be used as the sole basis for treatment or other  patient management decisions.  A negative result may occur with  improper specimen collection / handling, submission of specimen other  than nasopharyngeal swab, presence of viral mutation(s) within the  areas targeted by this assay, and inadequate number of viral copies  (<250 copies / mL). A negative result must be combined with clinical  observations, patient history, and epidemiological information. If result is POSITIVE SARS-CoV-2 target nucleic acids are DETECTED. The SARS -CoV-2 RNA is generally detectable in upper and lower  respiratory specimens during the acute phase of infection.  Positive  results are indicative of active infection with SARS-CoV-2.  Clinical  correlation with patient history and other diagnostic information is  necessary to determine patient infection status.  Positive results do  not rule out bacterial infection or co-infection with other viruses. If result is PRESUMPTIVE POSTIVE SARS-CoV-2 nucleic acids MAY BE PRESENT.   A presumptive positive result was obtained on the submitted specimen  and confirmed on repeat testing.  While 2019 novel coronavirus  (SARS-CoV-2) nucleic acids may be present in the submitted sample  additional confirmatory testing may be necessary for epidemiological  and / or clinical management purposes  to differentiate between  SARS-CoV-2 and other Sarbecovirus currently known to infect humans.  If clinically indicated additional testing with an  alternate test  methodology [phone]([id number]) is advise d. The SARS-CoV-2 RNA is generally  detectable in upper and lower respiratory specimens during the acute  phase of infection. The expected result is Negative. Fact Sheet for Patients:  BoilerBrush.com.cyhttps://www.fda.gov/media/136312/download Fact Sheet for Healthcare Providers: https://pope.com/https://www.fda.gov/media/136313/download This test is not yet approved or cleared by the [place] FDA and has been authorized for detection and/or diagnosis of SARS-CoV-2 by FDA under an Emergency Use Authorization (EUA).  This EUA will remain in effect (meaning this test can be used) for the duration of the COVID-19 declaration under Section 564(b)(1) of the Act, 21 U.S.C. section 360bbb-3(b)(1), unless the authorization is terminated or  revoked sooner. Performed at [facility], [address] [place], [place] [address]   Urine culture     Status: None   Collection Time: 10/10/18  8:39 PM   Specimen: Urine, Random  Result Value Ref Range Status   Specimen Description   Final    URINE, RANDOM Performed at [facility], [address] [place], [place] [address]    Special Requests   Final    Normal Performed at [facility], [address] [place], [place] [address]    Culture   Final    Multiple bacterial morphotypes present, none predominant. Suggest appropriate recollection if clinically indicated.   Report Status 10/12/2018 FINAL  Final  MRSA PCR Screening     Status: None   Collection Time: 10/16/18  4:40 PM   Specimen: Nasal Mucosa; Nasopharyngeal  Result Value Ref Range Status   MRSA by PCR NEGATIVE NEGATIVE Final    Comment:        The GeneXpert MRSA Assay (FDA approved for NASAL specimens only), is one component of a comprehensive MRSA colonization surveillance program. It is not intended to diagnose MRSA infection nor to guide or monitor treatment for MRSA infections. Performed at [facility],  [address] [place], [place] [address]       Radiology Studies: No results found.  Scheduled Meds: . sodium chloride   Intravenous Once  . ALPRAZolam  0.25 mg Oral Daily  . Chlorhexidine Gluconate Cloth  6 each Topical Q0600  . enoxaparin (LOVENOX) injection  0.5 mg/kg Subcutaneous Q12H  . hydrochlorothiazide  25 mg Oral Daily  . insulin aspart  0-20 Units Subcutaneous TID WC  . insulin aspart  0-5 Units Subcutaneous QHS  . insulin aspart  4 Units Subcutaneous TID WC  . insulin detemir  25 Units Subcutaneous QHS  . losartan  50 mg Oral Daily  . sertraline  50 mg Oral Daily  . sodium chloride flush  3 mL Intravenous Q12H   Continuous Infusions: . sodium chloride Stopped (10/12/18 1042)     LOS: 8 days   Critical care Time spent: 35 minutes.  Erick BlinksJehanzeb Neysha Criado, MD [facility] [email or web address] Password [email or web address] 10/18/2018, 1:40 PM

## 2018-10-18 NOTE — Progress Notes (Signed)
[facility] PCCM  Called daughter, had to leave message  Roselie Awkward, MD [place] PCCM Pager: [phone] Cell: [phone] If no response, call [phone]

## 2018-10-19 LAB — CBC
HCT: 41.9 % (ref 36.0–46.0)
Hemoglobin: 13.5 g/dL (ref 12.0–15.0)
MCH: 30 pg (ref 26.0–34.0)
MCHC: 32.2 g/dL (ref 30.0–36.0)
MCV: 93.1 fL (ref 80.0–100.0)
Platelets: 403 10*3/uL — ABNORMAL HIGH (ref 150–400)
RBC: 4.5 MIL/uL (ref 3.87–5.11)
RDW: 14.6 % (ref 11.5–15.5)
WBC: 14 10*3/uL — ABNORMAL HIGH (ref 4.0–10.5)
nRBC: 0 % (ref 0.0–0.2)

## 2018-10-19 LAB — COMPREHENSIVE METABOLIC PANEL
ALT: 59 U/L — ABNORMAL HIGH (ref 0–44)
AST: 36 U/L (ref 15–41)
Albumin: 2.7 g/dL — ABNORMAL LOW (ref 3.5–5.0)
Alkaline Phosphatase: 36 U/L — ABNORMAL LOW (ref 38–126)
Anion gap: 11 (ref 5–15)
BUN: 60 mg/dL — ABNORMAL HIGH (ref 8–23)
CO2: 23 mmol/L (ref 22–32)
Calcium: 8.3 mg/dL — ABNORMAL LOW (ref 8.9–10.3)
Chloride: 102 mmol/L (ref 98–111)
Creatinine, Ser: 1.19 mg/dL — ABNORMAL HIGH (ref 0.44–1.00)
GFR calc Af Amer: 55 mL/min — ABNORMAL LOW (ref >60)
GFR calc non Af Amer: 48 mL/min — ABNORMAL LOW (ref >60)
Glucose, Bld: 119 mg/dL — ABNORMAL HIGH (ref 70–99)
Potassium: 3.6 mmol/L (ref 3.5–5.1)
Sodium: 136 mmol/L (ref 135–145)
Total Bilirubin: 0.9 mg/dL (ref 0.3–1.2)
Total Protein: 6.3 g/dL — ABNORMAL LOW (ref 6.5–8.1)

## 2018-10-19 LAB — GLUCOSE, CAPILLARY
Glucose-Capillary: 118 mg/dL — ABNORMAL HIGH (ref 70–99)
Glucose-Capillary: 237 mg/dL — ABNORMAL HIGH (ref 70–99)
Glucose-Capillary: 137 mg/dL — ABNORMAL HIGH (ref 70–99)

## 2018-10-19 LAB — C-REACTIVE PROTEIN: CRP: 0.8 mg/dL (ref <1.0)

## 2018-10-19 LAB — D-DIMER, QUANTITATIVE: D-Dimer, Quant: 0.95 ug/mL-FEU — ABNORMAL HIGH (ref 0.00–0.50)

## 2018-10-19 LAB — LACTATE DEHYDROGENASE: LDH: 370 U/L — ABNORMAL HIGH (ref 98–192)

## 2018-10-19 LAB — FERRITIN: Ferritin: 764 ng/mL — ABNORMAL HIGH (ref 11–307)

## 2018-10-19 NOTE — Progress Notes (Signed)
PROGRESS NOTE  Christina Clark  WUJ:[id number] DOB: 20-Mar-1951 DOA: 10/10/2018 PCP: Patient, No Pcp Per   Brief Narrative: 68 year old Spanish-speaking female with past medical history of morbid obesity, hypertension and depression admitted to the hospitalist [facility] presenting to the emergency room with 1 week of fever, cough and shortness of breath in the setting of known COVID-19 contacts.  Patient found to be COVID positive as well as hypoxic with bilateral infiltrates on chest x-ray.  Requiring oxygen support of high flow.  Started on Remdisivir, steroids and given Actemra x1.  No history of diabetes, but noted to be hyperglycemic.  A1c checked and patient found to have A1c of 8.5, and started on low-dose Levemir plus sliding scale. Inflammatory markers have decreased, though she continues to require high levels of oxygen.  Assessment & Plan: Principal Problem:   Acute respiratory failure with hypoxia (HCC) Active Problems:   Hypertension   Depression   Acute respiratory disease due to COVID-19 virus   Morbid obesity with BMI of 40.0-44.9, adult (HCC)   Diabetes mellitus type 2, uncontrolled (HCC)  Acute respiratory failure with hypoxia due to COVID-19 pneumonia: s/p actemra x1 dose - Completed 5 days remdesivir (6/11 - 6/15) -Course of steroids will be completed tonight -Plans are to give the patient convalescent plasma today. - Monitor I/O maintain even/negative balance. Give lasix prn. - Avoid NSAIDs - Continue airborne/droplet precautions.  -She continues to require heated high flow with FiO2 of 90% O2 flow rate of 50 L/min. - Continue supplemental oxygen and wean down as tolerated. Remains "happily hypoxic." Continue proning and mobilizing.  Acute kidney injury.  Related to volume depletion with hydrochlorothiazide and ARB use.  These medications have been held.  She received IV fluids overnight with improvement of renal function.  Urine output is  fair.  Continue to monitor..  Hypertension: Blood pressures running low.  Hold antihypertensives  Depression: Having a significant amount of anxiety while hospitalized.  - Continue scheduled low dose xanax started while inpatient. - Continue home zoloft -mood appears stable at this time  Morbid obesity: BMI 41.   T2DM, uncontrolled with hyperglycemia: HbA1c of 8.5%. Not on medications at home.  - Continue to titrate insulin, increase levemir further today to 25u qHS, continue mealtime insulin today. Will likely discharge on oral medications with PCP follow up once clinically improving.   Leukocytosis: Suspect stress demargination and steroid effect.  - Monitor for secondary infections.  DVT prophylaxis: Lovenox Code Status: Full Family Communication: Daughter by phone per PCCM Disposition Plan: Remain in ICU for close monitoring  Consultants:   PCCM  Procedures:   None  Antimicrobials:  Remdesivir 6/11 - 6/15  Azithromycin 6/10   Subjective: She is feeling better today.  Denies any shortness of breath or cough.  Appetite is improving.  Objective: Vitals:   10/19/18 1515 10/19/18 1530 10/19/18 1532 10/19/18 1610  BP: (!) 82/45 (!) 75/43 (!) 106/44 (!) 100/43  Pulse: 72 96  72  Resp: (!) 26 16  (!) 25  Temp:      TempSrc:      SpO2: (!) 89% (!) 75%  (!) 87%  Weight:      Height:        Intake/Output Summary (Last 24 hours) at 10/19/2018 1631 Last data filed at 10/19/2018 1500 Gross per 24 hour  Intake 1434.47 ml  Output 2000 ml  Net -565.53 ml   Filed Weights   10/10/18 2300  Weight: 108.7 kg  General exam: Alert, awake, oriented x 3 Respiratory system: Clear to auscultation. Respiratory effort normal. Cardiovascular system:RRR. No murmurs, rubs, gallops. Gastrointestinal system: Abdomen is nondistended, soft and nontender. No organomegaly or masses felt. Normal bowel sounds heard. Central nervous system: Alert and oriented. No focal neurological  deficits. Extremities: No C/C/E, +pedal pulses Skin: No rashes, lesions or ulcers Psychiatry: Judgement and insight appear normal. Mood & affect appropriate.     Data Reviewed: I have personally reviewed following labs and imaging studies  CBC: Recent Labs  Lab 10/15/18 0520 10/17/18 0500 10/18/18 0630 10/19/18 0549  WBC 16.0* 21.2* 20.6* 14.0*  HGB 13.7 14.8 15.5* 13.5  HCT 42.6 45.2 46.8* 41.9  MCV 93.8 94.0 93.8 93.1  PLT 501* 480* 526* 403*   Basic Metabolic Panel: Recent Labs  Lab 10/15/18 0520 10/16/18 0530 10/17/18 0500 10/18/18 0630 10/19/18 0549  NA 136 137 135 134* 136  K 4.0 4.1 3.9 3.9 3.6  CL 98 100 96* 97* 102  CO2 26 24 23  18* 23  GLUCOSE 244* 224* 244* 152* 119*  BUN 50* 50* 55* 77* 60*  CREATININE 0.99 0.99 1.13* 1.73* 1.19*  CALCIUM 9.1 8.9 9.2 9.1 8.3*   GFR: Estimated Creatinine Clearance: 56 mL/min (A) (by C-G formula based on SCr of 1.19 mg/dL (H)). Liver Function Tests: Recent Labs  Lab 10/15/18 0520 10/16/18 0530 10/17/18 0500 10/18/18 0630 10/19/18 0549  AST 18 23 53* 33 36  ALT 38 40 68* 62* 59*  ALKPHOS 45 41 40 42 36*  BILITOT 0.4 0.8 0.8 0.7 0.9  PROT 8.1 7.7 8.1 7.6 6.3*  ALBUMIN 3.3* 3.1* 3.3* 3.3* 2.7*   No results for input(s): LIPASE, AMYLASE in the last 168 hours. No results for input(s): AMMONIA in the last 168 hours. Coagulation Profile: No results for input(s): INR, PROTIME in the last 168 hours. Cardiac Enzymes: No results for input(s): CKTOTAL, CKMB, CKMBINDEX, TROPONINI in the last 168 hours. BNP (last 3 results) No results for input(s): PROBNP in the last 8760 hours. HbA1C: No results for input(s): HGBA1C in the last 72 hours. CBG: Recent Labs  Lab 10/18/18 1230 10/18/18 1657 10/18/18 2128 10/19/18 0744 10/19/18 1234  GLUCAP 206* 92 93 118* 237*   Lipid Profile: No results for input(s): CHOL, HDL, LDLCALC, TRIG, CHOLHDL, LDLDIRECT in the last 72 hours. Thyroid Function Tests: No results for  input(s): TSH, T4TOTAL, FREET4, T3FREE, THYROIDAB in the last 72 hours. Anemia Panel: Recent Labs    10/18/18 1456 10/19/18 0549  FERRITIN 805* 764*   Urine analysis:    Component Value Date/Time   COLORURINE YELLOW (A) 10/10/2018 2039   APPEARANCEUR CLOUDY (A) 10/10/2018 2039   LABSPEC 1.020 10/10/2018 2039   PHURINE 5.0 10/10/2018 2039   GLUCOSEU NEGATIVE 10/10/2018 2039   HGBUR NEGATIVE 10/10/2018 2039   BILIRUBINUR NEGATIVE 10/10/2018 2039   KETONESUR NEGATIVE 10/10/2018 2039   PROTEINUR 30 (A) 10/10/2018 2039   NITRITE NEGATIVE 10/10/2018 2039   LEUKOCYTESUR TRACE (A) 10/10/2018 2039   Recent Results (from the past 240 hour(s))  Blood Culture (routine x 2)     Status: None   Collection Time: 10/10/18  4:31 PM   Specimen: BLOOD  Result Value Ref Range Status   Specimen Description BLOOD LEFT ANTECUBITAL  Final   Special Requests   Final    BOTTLES DRAWN AEROBIC AND ANAEROBIC Blood Culture adequate volume   Culture   Final    NO GROWTH 5 DAYS Performed at [facility], [address] [place], [place] [address]    Report Status 10/17/2018 FINAL  Final  Blood Culture (routine x 2)     Status: None   Collection Time: 10/10/18  4:31 PM   Specimen: BLOOD  Result Value Ref Range Status   Specimen Description BLOOD RIGHT ANTECUBITAL  Final   Special Requests   Final    BOTTLES DRAWN AEROBIC AND ANAEROBIC Blood Culture results may not be optimal due to an excessive volume of blood received in culture bottles   Culture   Final    NO GROWTH 5 DAYS Performed at [facility], [address] [place], [place] [address]    Report Status 10/17/2018 FINAL  Final  SARS Coronavirus 2 (CEPHEID- Performed in [facility] lab), Hosp Order     Status: Abnormal   Collection Time: 10/10/18  4:47 PM   Specimen: Nasopharyngeal Swab  Result Value Ref Range Status   SARS Coronavirus 2 POSITIVE (A) NEGATIVE Final    Comment: RESULT CALLED TO, READ BACK BY AND  VERIFIED WITH: TOM NAGY 10/10/18 @ 1748  MLK (NOTE) If result is NEGATIVE SARS-CoV-2 target nucleic acids are NOT DETECTED. The SARS-CoV-2 RNA is generally detectable in upper and lower  respiratory specimens during the acute phase of infection. The lowest  concentration of SARS-CoV-2 viral copies this assay can detect is 250  copies / mL. A negative result does not preclude SARS-CoV-2 infection  and should not be used as the sole basis for treatment or other  patient management decisions.  A negative result may occur with  improper specimen collection / handling, submission of specimen other  than nasopharyngeal swab, presence of viral mutation(s) within the  areas targeted by this assay, and inadequate number of viral copies  (<250 copies / mL). A negative result must be combined with clinical  observations, patient history, and epidemiological information. If result is POSITIVE SARS-CoV-2 target nucleic acids are DETECTED. The SARS -CoV-2 RNA is generally detectable in upper and lower  respiratory specimens during the acute phase of infection.  Positive  results are indicative of active infection with SARS-CoV-2.  Clinical  correlation with patient history and other diagnostic information is  necessary to determine patient infection status.  Positive results do  not rule out bacterial infection or co-infection with other viruses. If result is PRESUMPTIVE POSTIVE SARS-CoV-2 nucleic acids MAY BE PRESENT.   A presumptive positive result was obtained on the submitted specimen  and confirmed on repeat testing.  While 2019 novel coronavirus  (SARS-CoV-2) nucleic acids may be present in the submitted sample  additional confirmatory testing may be necessary for epidemiological  and / or clinical management purposes  to differentiate between  SARS-CoV-2 and other Sarbecovirus currently known to infect humans.  If clinically indicated additional testing with an alternate test  methodology  [phone]([id number]) is advise d. The SARS-CoV-2 RNA is generally  detectable in upper and lower respiratory specimens during the acute  phase of infection. The expected result is Negative. Fact Sheet for Patients:  BoilerBrush.com.cyhttps://www.fda.gov/media/136312/download Fact Sheet for Healthcare Providers: https://pope.com/https://www.fda.gov/media/136313/download This test is not yet approved or cleared by the [place] FDA and has been authorized for detection and/or diagnosis of SARS-CoV-2 by FDA under an Emergency Use Authorization (EUA).  This EUA will remain in effect (meaning this test can be used) for the duration of the COVID-19 declaration under Section 564(b)(1) of the Act, 21 U.S.C. section 360bbb-3(b)(1), unless the authorization is terminated or revoked sooner. Performed at [facility], [phone]  [address] [place], [place] [address]   Urine culture     Status: None   Collection Time: 10/10/18  8:39 PM   Specimen: Urine, Random  Result Value Ref Range Status   Specimen Description   Final    URINE, RANDOM Performed at [facility], [address], [place], [place] [address]    Special Requests   Final    Normal Performed at [facility], [address] [place], [place] [address]    Culture   Final    Multiple bacterial morphotypes present, none predominant. Suggest appropriate recollection if clinically indicated.   Report Status 10/12/2018 FINAL  Final  MRSA PCR Screening     Status: None   Collection Time: 10/16/18  4:40 PM   Specimen: Nasal Mucosa; Nasopharyngeal  Result Value Ref Range Status   MRSA by PCR NEGATIVE NEGATIVE Final    Comment:        The GeneXpert MRSA Assay (FDA approved for NASAL specimens only), is one component of a comprehensive MRSA colonization surveillance program. It is not intended to diagnose MRSA infection nor to guide or monitor treatment for MRSA infections. Performed at [facility], [place] [address],  [place], [place] [address]       Radiology Studies: No results found.  Scheduled Meds: . sodium chloride   Intravenous Once  . ALPRAZolam  0.25 mg Oral Daily  . Chlorhexidine Gluconate Cloth  6 each Topical Q0600  . enoxaparin (LOVENOX) injection  0.5 mg/kg Subcutaneous Q12H  . insulin aspart  0-20 Units Subcutaneous TID WC  . insulin aspart  0-5 Units Subcutaneous QHS  . insulin aspart  4 Units Subcutaneous TID WC  . insulin detemir  25 Units Subcutaneous QHS  . sertraline  50 mg Oral Daily  . sodium chloride flush  3 mL Intravenous Q12H   Continuous Infusions: . sodium chloride Stopped (10/12/18 1042)     LOS: 9 days   Critical care Time spent: 35 minutes.  Kathie Dike, MD [facility] [email or web address] Password [facility] 10/19/2018, 4:31 PM

## 2018-10-19 NOTE — Progress Notes (Signed)
Pt with unlabored breathing pattern, transient requires addtional O2 support with exertion. Have spoken with daughter often today w/ updates and much appreciated assistance with communication. Have also updated pt's niece, a physician in [place], per patient and daughtrer's request.

## 2018-10-19 NOTE — Progress Notes (Signed)
NAME:  Christina Clark, MRN:  [id number], DOB:  08/23/1950, LOS: 9 ADMISSION DATE:  10/10/2018, CONSULTATION DATE:  10/11/2018 REFERRING MD:  Bonner Puna, CHIEF COMPLAINT:  dyspnea   Brief History   68 year old morbidly obese female admitted for severe acute respiratory failure with hypoxemia due to COVID-19 pneumonia.  Past Medical History  Hypertension Morbid obesity Depression  Significant Hospital Events   Admission June 11  Consults:  Pulmonary and critical care medicine  Procedures:    Significant Diagnostic Tests:    Micro Data:  6/10 SARS COV-2 > positive 6/10 blood culture 6/10 urine culture  Antimicrobials:  6/12 Remdesivir > 6/15  6/11 Actemra x1 6/11 solumedrol  6/17 convalescent plasma >   Interim history/subjective:   Breathing imrpoved Slept OK Appetite increased  Objective   Blood pressure 116/90, pulse 87, temperature 98.8 F (37.1 C), temperature source Oral, resp. rate (!) 29, height 5\' 4"  (1.626 m), weight 108.7 kg, SpO2 (!) 80 %.    FiO2 (%):  [70 %-80 %] 70 %   Intake/Output Summary (Last 24 hours) at 10/19/2018 0815 Last data filed at 10/19/2018 0400 Gross per 24 hour  Intake 1532.2 ml  Output 1000 ml  Net 532.2 ml   Filed Weights   10/10/18 2300  Weight: 108.7 kg    Examination:  General:  Resting comfortably in chair HENT: NCAT OP clear PULM: Crackles bases B, normal effort CV: RRR, no mgr GI: BS+, soft, nontender MSK: normal bulk and tone Neuro: awake, alert, no distress, MAEW    Resolved Hospital Problem list     Assessment & Plan:  ARDS due to COVID-19 pneumonia: Oxygenation remains poor, however the patient does not have complaints of shortness of breath Continue to monitor carefully in ICU Tolerate periods of hypoxemia, goal at rest is greater than 85% SaO2, with movement ideally above 75% Decision for intubation should be based on a change in mental status or physical evidence of ventilatory failure such as  nasal flaring, accessory muscle use, paradoxical breathing Out of bed to chair as able Prone positioning while in bed Continue to administer headed high flow oxygen, weaning off FiO2 a able Continue to look for convalescent plasma   Best practice:  Diet: diabetic diet Pain/Anxiety/Delirium protocol (if indicated): n/a VAP protocol (if indicated): n/a DVT prophylaxis: Lovenox per COVID protocol GI prophylaxis: n/a Glucose control: SSI Mobility: out of bed as tolerated Code Status: full Family Communication: Daughter updated by phone on 6/19 Disposition: remain in ICU  Labs   CBC: Recent Labs  Lab 10/15/18 0520 10/17/18 0500 10/18/18 0630 10/19/18 0549  WBC 16.0* 21.2* 20.6* 14.0*  HGB 13.7 14.8 15.5* 13.5  HCT 42.6 45.2 46.8* 41.9  MCV 93.8 94.0 93.8 93.1  PLT 501* 480* 526* 403*    Basic Metabolic Panel: Recent Labs  Lab 10/15/18 0520 10/16/18 0530 10/17/18 0500 10/18/18 0630 10/19/18 0549  NA 136 137 135 134* 136  K 4.0 4.1 3.9 3.9 3.6  CL 98 100 96* 97* 102  CO2 26 24 23  18* 23  GLUCOSE 244* 224* 244* 152* 119*  BUN 50* 50* 55* 77* 60*  CREATININE 0.99 0.99 1.13* 1.73* 1.19*  CALCIUM 9.1 8.9 9.2 9.1 8.3*   GFR: Estimated Creatinine Clearance: 56 mL/min (A) (by C-G formula based on SCr of 1.19 mg/dL (H)). Recent Labs  Lab 10/15/18 0520 10/17/18 0500 10/18/18 0630 10/19/18 0549  WBC 16.0* 21.2* 20.6* 14.0*    Liver Function Tests: Recent Labs  Lab 10/15/18 0520 10/16/18  0530 10/17/18 0500 10/18/18 0630 10/19/18 0549  AST 18 23 53* 33 36  ALT 38 40 68* 62* 59*  ALKPHOS 45 41 40 42 36*  BILITOT 0.4 0.8 0.8 0.7 0.9  PROT 8.1 7.7 8.1 7.6 6.3*  ALBUMIN 3.3* 3.1* 3.3* 3.3* 2.7*   No results for input(s): LIPASE, AMYLASE in the last 168 hours. No results for input(s): AMMONIA in the last 168 hours.  ABG    Component Value Date/Time   PHART 7.52 (H) 10/10/2018 1647   PCO2ART 34 10/10/2018 1647   PO2ART 53 (L) 10/10/2018 1647   HCO3 27.8  10/10/2018 1647   O2SAT 90.6 10/10/2018 1647     Coagulation Profile: No results for input(s): INR, PROTIME in the last 168 hours.  Cardiac Enzymes: No results for input(s): CKTOTAL, CKMB, CKMBINDEX, TROPONINI in the last 168 hours.  HbA1C: Hgb A1c MFr Bld  Date/Time Value Ref Range Status  10/11/2018 05:50 AM 8.1 (H) 4.8 - 5.6 % Final    Comment:    (NOTE) Pre diabetes:          5.7%-6.4% Diabetes:              >6.4% Glycemic control for   <7.0% adults with diabetes     CBG: Recent Labs  Lab 10/18/18 0725 10/18/18 1230 10/18/18 1657 10/18/18 2128 10/19/18 0744  GLUCAP 157* 206* 92 93 118*     Critical care time: 31 minutes       Heber CarolinaBrent Meleni Delahunt, MD [place] [facility] Pager: [phone] Cell: [phone] If no response, call [phone]

## 2018-10-19 NOTE — Progress Notes (Signed)
Pt continues w/ HFNC, usually at 40L/70%. occ 30L/65%. Continue to attempt weaning as tolerated.

## 2018-10-19 NOTE — Progress Notes (Signed)
Pt daughter serves as Optometrist to help convey pt physical and emotional needs. Discussed with daughter, pt care and provided opportunity to ask questions every time she was on the phone (total of 8 calls from [place] until now). Pt is resting comfortably in bedside recliner.

## 2018-10-20 DIAGNOSIS — J069 Acute upper respiratory infection, unspecified: Secondary | ICD-10-CM

## 2018-10-20 DIAGNOSIS — E1165 Type 2 diabetes mellitus with hyperglycemia: Secondary | ICD-10-CM

## 2018-10-20 LAB — CBC
HCT: 43.2 % (ref 36.0–46.0)
Hemoglobin: 14.6 g/dL (ref 12.0–15.0)
MCH: 31.4 pg (ref 26.0–34.0)
MCHC: 33.8 g/dL (ref 30.0–36.0)
MCV: 92.9 fL (ref 80.0–100.0)
Platelets: 431 10*3/uL — ABNORMAL HIGH (ref 150–400)
RBC: 4.65 MIL/uL (ref 3.87–5.11)
RDW: 14.7 % (ref 11.5–15.5)
WBC: 12.9 10*3/uL — ABNORMAL HIGH (ref 4.0–10.5)
nRBC: 0 % (ref 0.0–0.2)

## 2018-10-20 LAB — COMPREHENSIVE METABOLIC PANEL
ALT: 60 U/L — ABNORMAL HIGH (ref 0–44)
AST: 28 U/L (ref 15–41)
Albumin: 2.9 g/dL — ABNORMAL LOW (ref 3.5–5.0)
Alkaline Phosphatase: 40 U/L (ref 38–126)
Anion gap: 8 (ref 5–15)
BUN: 56 mg/dL — ABNORMAL HIGH (ref 8–23)
CO2: 28 mmol/L (ref 22–32)
Calcium: 8.5 mg/dL — ABNORMAL LOW (ref 8.9–10.3)
Chloride: 100 mmol/L (ref 98–111)
Creatinine, Ser: 1.15 mg/dL — ABNORMAL HIGH (ref 0.44–1.00)
GFR calc Af Amer: 57 mL/min — ABNORMAL LOW (ref >60)
GFR calc non Af Amer: 50 mL/min — ABNORMAL LOW (ref >60)
Glucose, Bld: 192 mg/dL — ABNORMAL HIGH (ref 70–99)
Potassium: 3.5 mmol/L (ref 3.5–5.1)
Sodium: 136 mmol/L (ref 135–145)
Total Bilirubin: 0.7 mg/dL (ref 0.3–1.2)
Total Protein: 6.8 g/dL (ref 6.5–8.1)

## 2018-10-20 LAB — PREPARE FRESH FROZEN PLASMA: Unit division: 0

## 2018-10-20 LAB — BPAM FFP
Blood Product Expiration Date: 202006202250
ISSUE DATE / TIME: 202006192342
Unit Type and Rh: 6200

## 2018-10-20 LAB — GLUCOSE, CAPILLARY
Glucose-Capillary: 141 mg/dL — ABNORMAL HIGH (ref 70–99)
Glucose-Capillary: 170 mg/dL — ABNORMAL HIGH (ref 70–99)
Glucose-Capillary: 152 mg/dL — ABNORMAL HIGH (ref 70–99)
Glucose-Capillary: 151 mg/dL — ABNORMAL HIGH (ref 70–99)

## 2018-10-20 LAB — C-REACTIVE PROTEIN: CRP: 0.9 mg/dL (ref <1.0)

## 2018-10-20 LAB — FERRITIN: Ferritin: 582 ng/mL — ABNORMAL HIGH (ref 11–307)

## 2018-10-20 LAB — LACTATE DEHYDROGENASE: LDH: 374 U/L — ABNORMAL HIGH (ref 98–192)

## 2018-10-20 LAB — D-DIMER, QUANTITATIVE: D-Dimer, Quant: 1.31 ug/mL-FEU — ABNORMAL HIGH (ref 0.00–0.50)

## 2018-10-20 NOTE — Progress Notes (Signed)
Spoke with patient's daughter and updated her on patient's care. Daughter and patient very pleased and have no questions at this time.

## 2018-10-20 NOTE — Progress Notes (Signed)
Pt back to bed and out of recliner.  No c/o pain at this time. Pt had no difficulty transferring. VSS. SPO2 on monitor recovered.

## 2018-10-20 NOTE — Evaluation (Signed)
Physical Therapy Evaluation Patient Details Name: Christina Clark MRN: [id number] DOB: 01-Jun-1950 Today's Date: 10/20/2018   History of Present Illness  68 year old Spanish-speaking female with past medical history of morbid obesity, hypertension and depression  presenting to the emergency room 10/10/18 with 1 week of fever, cough and shortness of breath in the setting of known COVID-19 contacts.  Patient found to be COVID positive as well as hypoxic with bilateral infiltrates on chest x-ray.  Requiring oxygen support of high flow  Clinical Impression  The patient  In recliner by RN who reports 1 assist. Patient noted to desaturate into 70's with minimal activity on 60% FiO2. Evaluation  Limited , did have [patient stand x 1. Patient's strength is good, just very compromised from pulmonary. Pt admitted with above diagnosis. Pt currently with functional limitations due to the deficits listed below (see PT Problem List).  Pt will benefit from skilled PT to increase their independence and safety with mobility to allow discharge to the venue listed below.       Follow Up Recommendations CIR    Equipment Recommendations  Rolling walker with 5" wheels    Recommendations for Other Services Rehab consult     Precautions / Restrictions Precautions Precaution Comments: monitor sats , VS on HFNC at 60% FiO2, sats drop quickly      Mobility  Bed Mobility               General bed mobility comments: oob  Transfers Overall transfer level: Needs assistance Equipment used: 1 person hand held assist Transfers: Sit to/from Stand Sit to Stand: Min assist         General transfer comment: sytood from  recliner with bed rail and HHA x 1, SaO2 dropping into 70's. so stopped  Ambulation/Gait             General Gait Details: unable  Stairs            Wheelchair Mobility    Modified Rankin (Stroke Patients Only)       Balance Overall balance assessment: Needs  assistance Sitting-balance support: No upper extremity supported;Feet supported Sitting balance-Leahy Scale: Good                                       Pertinent Vitals/Pain Pain Assessment: No/denies pain    Home Living Family/patient expects to be discharged to:: Private residence Living Arrangements: Children Available Help at Discharge: Family;Available 24 hours/day Type of Home: House Home Access: Stairs to enter   [employer] of Steps: 4 Home Layout: One level Home Equipment: None      Prior Function Level of Independence: Independent               Hand Dominance   Dominant Hand: Right    Extremity/Trunk Assessment   Upper Extremity Assessment Upper Extremity Assessment: Defer to OT evaluation    Lower Extremity Assessment Lower Extremity Assessment: Overall WFL for tasks assessed    Cervical / Trunk Assessment Cervical / Trunk Assessment: Normal  Communication   Communication: Prefers language other than English  Cognition Arousal/Alertness: Awake/alert Behavior During Therapy: Anxious;WFL for tasks assessed/performed Overall Cognitive Status: Within Functional Limits for tasks assessed                                 General Comments: daughter on  phone interpreting, [facility], followed all directions      General Comments      Exercises General Exercises - Lower Extremity Long Arc Quad: AROM;Both;10 reps Hip Flexion/Marching: AROM;Both;10 reps   Assessment/Plan    PT Assessment Patient needs continued PT services  PT Problem List Decreased strength;Decreased mobility;Decreased knowledge of precautions;Decreased activity tolerance;Cardiopulmonary status limiting activity;Decreased knowledge of use of DME       PT Treatment Interventions DME instruction;Therapeutic activities;Gait training;Therapeutic exercise;Functional mobility training;Patient/family education    PT Goals (Current goals  can be found in the Care Plan section)  Acute Rehab PT Goals Patient Stated Goal: to get stronger , walk PT Goal Formulation: With patient/family Time For Goal Achievement: 11/03/18 Potential to Achieve Goals: Good    Frequency Min 3X/week   Barriers to discharge        Co-evaluation               AM-PAC PT "6 Clicks" Mobility  Outcome Measure Help needed turning from your back to your side while in a flat bed without using bedrails?: A Lot Help needed moving from lying on your back to sitting on the side of a flat bed without using bedrails?: A Lot Help needed moving to and from a bed to a chair (including a wheelchair)?: A Lot Help needed standing up from a chair using your arms (e.g., wheelchair or bedside chair)?: A Lot Help needed to walk in hospital room?: A Lot Help needed climbing 3-5 steps with a railing? : Total 6 Click Score: 11    End of Session Equipment Utilized During Treatment: Oxygen(on 60% FiO2 via HFNC) Activity Tolerance: Treatment limited secondary to medical complications (Comment) Patient left: in chair;with call bell/phone within reach;with nursing/sitter in room(family on phone) Nurse Communication: Mobility status PT Visit Diagnosis: Difficulty in walking, not elsewhere classified (R26.2)    Time: [id number] PT Time Calculation (min) (ACUTE ONLY): 30 min   Charges:   PT Evaluation $PT Eval Moderate Complexity: 1 Mod PT Treatments $Therapeutic Activity: 8-22 mins        [phone]  Rada [place], Thai Burgueno [name] 10/20/2018, 2:21 PM

## 2018-10-20 NOTE — Progress Notes (Signed)
PROGRESS NOTE  Christina Clark  HDQ:[id number] DOB: 1950/10/14 DOA: 10/10/2018 PCP: Patient, No Pcp Per   Brief Narrative: 68 year old Spanish-speaking female with past medical history of morbid obesity, hypertension and depression admitted to the hospitalist service on [place] presenting to the emergency room with 1 week of fever, cough and shortness of breath in the setting of known COVID-19 contacts.  Patient found to be COVID positive as well as hypoxic with bilateral infiltrates on chest x-ray.  Requiring oxygen support of high flow.  Started on Remdisivir, steroids and given Actemra x1.  No history of diabetes, but noted to be hyperglycemic.  A1c checked and patient found to have A1c of 8.5, and started on low-dose Levemir plus sliding scale. Inflammatory markers have decreased, though she continues to require high levels of oxygen.  Assessment & Plan: Principal Problem:   Acute respiratory failure with hypoxia (HCC) Active Problems:   Hypertension   Depression   Acute respiratory disease due to COVID-19 virus   Morbid obesity with BMI of 40.0-44.9, adult (HCC)   Diabetes mellitus type 2, uncontrolled ([place])  Acute respiratory failure with hypoxia due to COVID-19 pneumonia: s/p actemra x1 dose - Completed 5 days remdesivir (6/11 - 6/15) -Course of steroids will be completed tonight -Received convalescent plasma on 6/19. - Monitor I/O maintain even/negative balance. Give lasix prn. - Avoid NSAIDs - Continue airborne/droplet precautions.  -She continues to require heated high flow with FiO2 of 65% O2 flow rate of 30 L/min. - Continue supplemental oxygen and wean down as tolerated. Remains "happily hypoxic." Continue proning and mobilizing.  Acute kidney injury.  Related to volume depletion with hydrochlorothiazide and ARB use.  These medications have been held.  She received IV fluids overnight with improvement of renal function.  Urine output is fair.  Continue to  monitor.  Hypertension: Blood pressures running low.  Holding antihypertensives  Depression: Had a significant amount of anxiety while hospitalized.  - Continue scheduled low dose xanax started while inpatient. - Continue home zoloft -mood appears stable at this time  Morbid obesity: BMI 41.   T2DM, uncontrolled with hyperglycemia: HbA1c of 8.5%. Not on medications at home.  - Continue to titrate insulin, increase levemir further today to 25u qHS, continue mealtime insulin today. Will likely discharge on oral medications with PCP follow up once clinically improving.   Leukocytosis: Suspect stress demargination and steroid effect.  - Monitor for secondary infections.  Trending down  DVT prophylaxis: Lovenox Code Status: Full Family Communication: Daughter by phone per PCCM Disposition Plan: Remain in ICU for close monitoring  Consultants:   PCCM  Procedures:   None  Antimicrobials:  Remdesivir 6/11 - 6/15  Azithromycin 6/10   Subjective: Continues to feel better.  Denies any shortness of breath.  No chest congestion.  She does have a stuffy nose.  Objective: Vitals:   10/20/18 1115 10/20/18 1130 10/20/18 1140 10/20/18 1145  BP:   (!) 104/51   Pulse: 80 78 81 79  Resp: (!) 23 (!) 24 (!) 28 (!) 28  Temp:      TempSrc:      SpO2: 91% 90% 90% (!) 89%  Weight:      Height:        Intake/Output Summary (Last 24 hours) at 10/20/2018 1314 Last data filed at 10/20/2018 1030 Gross per 24 hour  Intake 897 ml  Output 2001 ml  Net -1104 ml   Filed Weights   10/10/18 2300  Weight: 108.7 kg  General exam: Alert, awake, oriented x 3 Respiratory system: Clear to auscultation. Respiratory effort normal. Cardiovascular system:RRR. No murmurs, rubs, gallops. Gastrointestinal system: Abdomen is nondistended, soft and nontender. No organomegaly or masses felt. Normal bowel sounds heard. Central nervous system: Alert and oriented. No focal neurological deficits.  Extremities: No C/C/E, +pedal pulses Skin: No rashes, lesions or ulcers Psychiatry: Judgement and insight appear normal. Mood & affect appropriate.      Data Reviewed: I have personally reviewed following labs and imaging studies  CBC: Recent Labs  Lab 10/15/18 0520 10/17/18 0500 10/18/18 0630 10/19/18 0549 10/20/18 0050  WBC 16.0* 21.2* 20.6* 14.0* 12.9*  HGB 13.7 14.8 15.5* 13.5 14.6  HCT 42.6 45.2 46.8* 41.9 43.2  MCV 93.8 94.0 93.8 93.1 92.9  PLT 501* 480* 526* 403* 431*   Basic Metabolic Panel: Recent Labs  Lab 10/16/18 0530 10/17/18 0500 10/18/18 0630 10/19/18 0549 10/20/18 0050  NA 137 135 134* 136 136  K 4.1 3.9 3.9 3.6 3.5  CL 100 96* 97* 102 100  CO2 24 23 18* 23 28  GLUCOSE 224* 244* 152* 119* 192*  BUN 50* 55* 77* 60* 56*  CREATININE 0.99 1.13* 1.73* 1.19* 1.15*  CALCIUM 8.9 9.2 9.1 8.3* 8.5*   GFR: Estimated Creatinine Clearance: 58 mL/min (A) (by C-G formula based on SCr of 1.15 mg/dL (H)). Liver Function Tests: Recent Labs  Lab 10/16/18 0530 10/17/18 0500 10/18/18 0630 10/19/18 0549 10/20/18 0050  AST 23 53* 33 36 28  ALT 40 68* 62* 59* 60*  ALKPHOS 41 40 42 36* 40  BILITOT 0.8 0.8 0.7 0.9 0.7  PROT 7.7 8.1 7.6 6.3* 6.8  ALBUMIN 3.1* 3.3* 3.3* 2.7* 2.9*   No results for input(s): LIPASE, AMYLASE in the last 168 hours. No results for input(s): AMMONIA in the last 168 hours. Coagulation Profile: No results for input(s): INR, PROTIME in the last 168 hours. Cardiac Enzymes: No results for input(s): CKTOTAL, CKMB, CKMBINDEX, TROPONINI in the last 168 hours. BNP (last 3 results) No results for input(s): PROBNP in the last 8760 hours. HbA1C: No results for input(s): HGBA1C in the last 72 hours. CBG: Recent Labs  Lab 10/19/18 0744 10/19/18 1234 10/19/18 1806 10/20/18 0752 10/20/18 1253  GLUCAP 118* 237* 137* 141* 170*   Lipid Profile: No results for input(s): CHOL, HDL, LDLCALC, TRIG, CHOLHDL, LDLDIRECT in the last 72 hours.  Thyroid Function Tests: No results for input(s): TSH, T4TOTAL, FREET4, T3FREE, THYROIDAB in the last 72 hours. Anemia Panel: Recent Labs    10/19/18 0549 10/20/18 0050  FERRITIN 764* 582*   Urine analysis:    Component Value Date/Time   COLORURINE YELLOW (A) 10/10/2018 2039   APPEARANCEUR CLOUDY (A) 10/10/2018 2039   LABSPEC 1.020 10/10/2018 2039   PHURINE 5.0 10/10/2018 2039   GLUCOSEU NEGATIVE 10/10/2018 2039   HGBUR NEGATIVE 10/10/2018 2039   BILIRUBINUR NEGATIVE 10/10/2018 2039   KETONESUR NEGATIVE 10/10/2018 2039   PROTEINUR 30 (A) 10/10/2018 2039   NITRITE NEGATIVE 10/10/2018 2039   LEUKOCYTESUR TRACE (A) 10/10/2018 2039   Recent Results (from the past 240 hour(s))  Blood Culture (routine x 2)     Status: None   Collection Time: 10/10/18  4:31 PM   Specimen: BLOOD  Result Value Ref Range Status   Specimen Description BLOOD LEFT ANTECUBITAL  Final   Special Requests   Final    BOTTLES DRAWN AEROBIC AND ANAEROBIC Blood Culture adequate volume   Culture   Final    NO GROWTH 5 DAYS  Performed at [facility], [address] [place], [place] [address]    Report Status 10/17/2018 FINAL  Final  Blood Culture (routine x 2)     Status: None   Collection Time: 10/10/18  4:31 PM   Specimen: BLOOD  Result Value Ref Range Status   Specimen Description BLOOD RIGHT ANTECUBITAL  Final   Special Requests   Final    BOTTLES DRAWN AEROBIC AND ANAEROBIC Blood Culture results may not be optimal due to an excessive volume of blood received in culture bottles   Culture   Final    NO GROWTH 5 DAYS Performed at [facility], [address] [place], [place] [address]    Report Status 10/17/2018 FINAL  Final  SARS Coronavirus 2 (CEPHEID- Performed in [facility] lab), Hosp Order     Status: Abnormal   Collection Time: 10/10/18  4:47 PM   Specimen: Nasopharyngeal Swab  Result Value Ref Range Status   SARS Coronavirus 2 POSITIVE (A) NEGATIVE Final     Comment: RESULT CALLED TO, READ BACK BY AND VERIFIED WITH: TOM NAGY 10/10/18 @ 1748  MLK (NOTE) If result is NEGATIVE SARS-CoV-2 target nucleic acids are NOT DETECTED. The SARS-CoV-2 RNA is generally detectable in upper and lower  respiratory specimens during the acute phase of infection. The lowest  concentration of SARS-CoV-2 viral copies this assay can detect is 250  copies / mL. A negative result does not preclude SARS-CoV-2 infection  and should not be used as the sole basis for treatment or other  patient management decisions.  A negative result may occur with  improper specimen collection / handling, submission of specimen other  than nasopharyngeal swab, presence of viral mutation(s) within the  areas targeted by this assay, and inadequate number of viral copies  (<250 copies / mL). A negative result must be combined with clinical  observations, patient history, and epidemiological information. If result is POSITIVE SARS-CoV-2 target nucleic acids are DETECTED. The SARS -CoV-2 RNA is generally detectable in upper and lower  respiratory specimens during the acute phase of infection.  Positive  results are indicative of active infection with SARS-CoV-2.  Clinical  correlation with patient history and other diagnostic information is  necessary to determine patient infection status.  Positive results do  not rule out bacterial infection or co-infection with other viruses. If result is PRESUMPTIVE POSTIVE SARS-CoV-2 nucleic acids MAY BE PRESENT.   A presumptive positive result was obtained on the submitted specimen  and confirmed on repeat testing.  While 2019 novel coronavirus  (SARS-CoV-2) nucleic acids may be present in the submitted sample  additional confirmatory testing may be necessary for epidemiological  and / or clinical management purposes  to differentiate between  SARS-CoV-2 and other Sarbecovirus currently known to infect humans.  If clinically indicated additional  testing with an alternate test  methodology [phone] is advise d. The SARS-CoV-2 RNA is generally  detectable in upper and lower respiratory specimens during the acute  phase of infection. The expected result is Negative. Fact Sheet for Patients:  BoilerBrush.com.cyhttps://www.fda.gov/media/136312/download Fact Sheet for Healthcare Providers: https://pope.com/https://www.fda.gov/media/136313/download This test is not yet approved or cleared by the [place] FDA and has been authorized for detection and/or diagnosis of SARS-CoV-2 by FDA under an Emergency Use Authorization (EUA).  This EUA will remain in effect (meaning this test can be used) for the duration of the COVID-19 declaration under Section 564(b)(1) of the Act, 21 U.S.C. section 360bbb-3(b)(1), unless the authorization is terminated or  revoked sooner. Performed at [facility], [address] [place], [place] [address]   Urine culture     Status: None   Collection Time: 10/10/18  8:39 PM   Specimen: Urine, Random  Result Value Ref Range Status   Specimen Description   Final    URINE, RANDOM Performed at [facility], [address] [place], [place] [address]    Special Requests   Final    Normal Performed at [facility], [address] [place], [place] [address]    Culture   Final    Multiple bacterial morphotypes present, none predominant. Suggest appropriate recollection if clinically indicated.   Report Status 10/12/2018 FINAL  Final  MRSA PCR Screening     Status: None   Collection Time: 10/16/18  4:40 PM   Specimen: Nasal Mucosa; Nasopharyngeal  Result Value Ref Range Status   MRSA by PCR NEGATIVE NEGATIVE Final    Comment:        The GeneXpert MRSA Assay (FDA approved for NASAL specimens only), is one component of a comprehensive MRSA colonization surveillance program. It is not intended to diagnose MRSA infection nor to guide or monitor treatment for MRSA infections. Performed at [facility], [address] [place], [place] [address]       Radiology Studies: No results found.  Scheduled Meds: . sodium chloride   Intravenous Once  . ALPRAZolam  0.25 mg Oral Daily  . Chlorhexidine Gluconate Cloth  6 each Topical Q0600  . enoxaparin (LOVENOX) injection  0.5 mg/kg Subcutaneous Q12H  . insulin aspart  0-20 Units Subcutaneous TID WC  . insulin aspart  0-5 Units Subcutaneous QHS  . insulin aspart  4 Units Subcutaneous TID WC  . insulin detemir  25 Units Subcutaneous QHS  . sertraline  50 mg Oral Daily  . sodium chloride flush  3 mL Intravenous Q12H   Continuous Infusions: . sodium chloride Stopped (10/12/18 1042)     LOS: 10 days   Critical care Time spent: 35 minutes.  Erick BlinksJehanzeb Idriss Quackenbush, MD [facility] [email or web address] Password [email or web address] 10/20/2018, 1:14 PM

## 2018-10-20 NOTE — Progress Notes (Signed)
Rehab Admissions Coordinator Note:  Patient was screened by Jhonnie Garner for appropriateness for an Inpatient Acute Rehab Consult.  At this time, we are recommending Inpatient Rehab consult. AC will contact MD to request an IP rehab consult order to allow [place] to continue to follow and monitor rehab needs while pt progresses medically. Per protocol, in order for this patient to be considered for CIR, they will need to have two negative tests prior to being admitted to the program.   Jhonnie Garner 10/20/2018, 3:10 PM  I can be reached at [phone].

## 2018-10-20 NOTE — Progress Notes (Signed)
NAME:  Christina Clark, MRN:  [id number], DOB:  1951/03/21, LOS: 25 ADMISSION DATE:  10/10/2018, CONSULTATION DATE:  10/11/2018 REFERRING MD:  Bonner Puna, CHIEF COMPLAINT:  dyspnea   Brief History   68 year old morbidly obese female admitted for severe acute respiratory failure with hypoxemia due to COVID-19 pneumonia.  Past Medical History  Hypertension Morbid obesity Depression  Significant Hospital Events   Admission June 11  Consults:  Pulmonary and critical care medicine  Procedures:    Significant Diagnostic Tests:    Micro Data:  6/10 SARS COV-2 > positive 6/10 blood culture 6/10 urine culture  Antimicrobials:  6/12 Remdesivir > 6/15  6/11 Actemra x1 6/11 solumedrol  6/19 convalescent plasma   Interim history/subjective:   Breathing has improved Oxygenation better Appetite OK   Objective   Blood pressure (!) 116/52, pulse 71, temperature 98.2 F (36.8 C), temperature source Oral, resp. rate (!) 23, height 5\' 4"  (1.626 m), weight 108.7 kg, SpO2 90 %.    FiO2 (%):  [65 %-80 %] 80 %   Intake/Output Summary (Last 24 hours) at 10/20/2018 0803 Last data filed at 10/20/2018 0700 Gross per 24 hour  Intake 840 ml  Output 2000 ml  Net -1160 ml   Filed Weights   10/10/18 2300  Weight: 108.7 kg    Examination:  General:  Resting comfortably in bed HENT: NCAT OP clear PULM: CTA B, normal effort CV: RRR, no mgr GI: BS+, soft, nontender MSK: normal bulk and tone Neuro: awake, alert, no distress, MAEW   Resolved Hospital Problem list     Assessment & Plan:  ARDS due to COVID-19 pneumonia: Oxygenation remains poor, however the patient does not have complaints of shortness of breath Continue to monitor carefully in ICU Tolerate periods of hypoxemia, goal at rest is greater than 85% SaO2, with movement ideally above 75% Decision for intubation should be based on a change in mental status or physical evidence of ventilatory failure such as nasal  flaring, accessory muscle use, paradoxical breathing Out of bed to chair as able Prone positioning while in bed Continue heated high flow, weaning off FiO2 as able  Daughter updated bedside by phone   Best practice:  Diet: diabetic diet Pain/Anxiety/Delirium protocol (if indicated): n/a VAP protocol (if indicated): n/a DVT prophylaxis: Lovenox per COVID protocol GI prophylaxis: n/a Glucose control: SSI Mobility: out of bed as tolerated Code Status: full Family Communication: Daughter updated by phone on 6/19 Disposition: remain in ICU  Labs   CBC: Recent Labs  Lab 10/15/18 0520 10/17/18 0500 10/18/18 0630 10/19/18 0549 10/20/18 0050  WBC 16.0* 21.2* 20.6* 14.0* 12.9*  HGB 13.7 14.8 15.5* 13.5 14.6  HCT 42.6 45.2 46.8* 41.9 43.2  MCV 93.8 94.0 93.8 93.1 92.9  PLT 501* 480* 526* 403* 431*    Basic Metabolic Panel: Recent Labs  Lab 10/16/18 0530 10/17/18 0500 10/18/18 0630 10/19/18 0549 10/20/18 0050  NA 137 135 134* 136 136  K 4.1 3.9 3.9 3.6 3.5  CL 100 96* 97* 102 100  CO2 24 23 18* 23 28  GLUCOSE 224* 244* 152* 119* 192*  BUN 50* 55* 77* 60* 56*  CREATININE 0.99 1.13* 1.73* 1.19* 1.15*  CALCIUM 8.9 9.2 9.1 8.3* 8.5*   GFR: Estimated Creatinine Clearance: 58 mL/min (A) (by C-G formula based on SCr of 1.15 mg/dL (H)). Recent Labs  Lab 10/17/18 0500 10/18/18 0630 10/19/18 0549 10/20/18 0050  WBC 21.2* 20.6* 14.0* 12.9*    Liver Function Tests: Recent Labs  Lab  10/16/18 0530 10/17/18 0500 10/18/18 0630 10/19/18 0549 10/20/18 0050  AST 23 53* 33 36 28  ALT 40 68* 62* 59* 60*  ALKPHOS 41 40 42 36* 40  BILITOT 0.8 0.8 0.7 0.9 0.7  PROT 7.7 8.1 7.6 6.3* 6.8  ALBUMIN 3.1* 3.3* 3.3* 2.7* 2.9*   No results for input(s): LIPASE, AMYLASE in the last 168 hours. No results for input(s): AMMONIA in the last 168 hours.  ABG    Component Value Date/Time   PHART 7.52 (H) 10/10/2018 1647   PCO2ART 34 10/10/2018 1647   PO2ART 53 (L) 10/10/2018 1647    HCO3 27.8 10/10/2018 1647   O2SAT 90.6 10/10/2018 1647     Coagulation Profile: No results for input(s): INR, PROTIME in the last 168 hours.  Cardiac Enzymes: No results for input(s): CKTOTAL, CKMB, CKMBINDEX, TROPONINI in the last 168 hours.  HbA1C: Hgb A1c MFr Bld  Date/Time Value Ref Range Status  10/11/2018 05:50 AM 8.1 (H) 4.8 - 5.6 % Final    Comment:    (NOTE) Pre diabetes:          5.7%-6.4% Diabetes:              >6.4% Glycemic control for   <7.0% adults with diabetes     CBG: Recent Labs  Lab 10/18/18 1657 10/18/18 2128 10/19/18 0744 10/19/18 1234 10/19/18 1806  GLUCAP 92 93 118* 237* 137*     Critical care time: 31 minutes       Heber CarolinaBrent Ida Milbrath, MD [place] P[facility]M Pager: [phone] Cell: [phone] If no response, call [phone]

## 2018-10-21 DIAGNOSIS — Z6841 Body Mass Index (BMI) 40.0 and over, adult: Secondary | ICD-10-CM

## 2018-10-21 DIAGNOSIS — G9340 Encephalopathy, unspecified: Secondary | ICD-10-CM

## 2018-10-21 LAB — COMPREHENSIVE METABOLIC PANEL
ALT: 52 U/L — ABNORMAL HIGH (ref 0–44)
AST: 25 U/L (ref 15–41)
Albumin: 2.8 g/dL — ABNORMAL LOW (ref 3.5–5.0)
Alkaline Phosphatase: 40 U/L (ref 38–126)
Anion gap: 10 (ref 5–15)
BUN: 41 mg/dL — ABNORMAL HIGH (ref 8–23)
CO2: 27 mmol/L (ref 22–32)
Calcium: 8.5 mg/dL — ABNORMAL LOW (ref 8.9–10.3)
Chloride: 101 mmol/L (ref 98–111)
Creatinine, Ser: 0.98 mg/dL (ref 0.44–1.00)
GFR calc Af Amer: >60 mL/min (ref >60)
GFR calc non Af Amer: >60 mL/min (ref >60)
Glucose, Bld: 109 mg/dL — ABNORMAL HIGH (ref 70–99)
Potassium: 3.5 mmol/L (ref 3.5–5.1)
Sodium: 138 mmol/L (ref 135–145)
Total Bilirubin: 0.6 mg/dL (ref 0.3–1.2)
Total Protein: 6.3 g/dL — ABNORMAL LOW (ref 6.5–8.1)

## 2018-10-21 LAB — C-REACTIVE PROTEIN: CRP: <0.8 mg/dL (ref <1.0)

## 2018-10-21 LAB — GLUCOSE, CAPILLARY
Glucose-Capillary: 109 mg/dL — ABNORMAL HIGH (ref 70–99)
Glucose-Capillary: 145 mg/dL — ABNORMAL HIGH (ref 70–99)
Glucose-Capillary: 139 mg/dL — ABNORMAL HIGH (ref 70–99)
Glucose-Capillary: 206 mg/dL — ABNORMAL HIGH (ref 70–99)

## 2018-10-21 LAB — CBC
HCT: 40.3 % (ref 36.0–46.0)
Hemoglobin: 13 g/dL (ref 12.0–15.0)
MCH: 30.4 pg (ref 26.0–34.0)
MCHC: 32.3 g/dL (ref 30.0–36.0)
MCV: 94.4 fL (ref 80.0–100.0)
Platelets: 330 10*3/uL (ref 150–400)
RBC: 4.27 MIL/uL (ref 3.87–5.11)
RDW: 14.6 % (ref 11.5–15.5)
WBC: 13.1 10*3/uL — ABNORMAL HIGH (ref 4.0–10.5)
nRBC: 0 % (ref 0.0–0.2)

## 2018-10-21 LAB — FERRITIN: Ferritin: 535 ng/mL — ABNORMAL HIGH (ref 11–307)

## 2018-10-21 LAB — D-DIMER, QUANTITATIVE: D-Dimer, Quant: 2.23 ug/mL-FEU — ABNORMAL HIGH (ref 0.00–0.50)

## 2018-10-21 LAB — LACTATE DEHYDROGENASE: LDH: 382 U/L — ABNORMAL HIGH (ref 98–192)

## 2018-10-21 NOTE — Progress Notes (Signed)
Unable to locate patient's Left Hearing aid that was removed on day shift before her bath per patient. Will inform day shift staff and charge RN aware.

## 2018-10-21 NOTE — Progress Notes (Signed)
PROGRESS NOTE  Christina Clark  JJK:[id number] DOB: 1950/07/02 DOA: 10/10/2018 PCP: Patient, No Pcp Per   Brief Narrative: 68 year old Spanish-speaking female with past medical history of morbid obesity, hypertension and depression admitted to the hospitalist service on [place] presenting to the emergency room with 1 week of fever, cough and shortness of breath in the setting of known COVID-19 contacts.  Patient found to be COVID positive as well as hypoxic with bilateral infiltrates on chest x-ray.  Requiring oxygen support of high flow.  Started on Remdisivir, steroids and given Actemra x1.  No history of diabetes, but noted to be hyperglycemic.  A1c checked and patient found to have A1c of 8.5, and started on low-dose Levemir plus sliding scale. Inflammatory markers have decreased, though she continues to require high levels of oxygen.  Assessment & Plan: Principal Problem:   Acute respiratory failure with hypoxia (HCC) Active Problems:   Hypertension   Depression   Acute respiratory disease due to COVID-19 virus   Morbid obesity with BMI of 40.0-44.9, adult (HCC)   Diabetes mellitus type 2, uncontrolled ([place])  Acute respiratory failure with hypoxia due to COVID-19 pneumonia: s/p actemra x1 dose - Completed 5 days remdesivir (6/11 - 6/15) -Course of steroids will be completed tonight -Received convalescent plasma on 6/19. - Monitor I/O maintain even/negative balance. Give lasix prn. - Avoid NSAIDs -Overall oxygen supplementation was increased overnight, but her pulse oximeter sensor was noted to be on her finger.  This generally has resulted in inaccurate readings.  Once sensor was placed on her earlobe, overall oxygen saturations improved.  She is now being titrated down on her oxygen once again. - Continue supplemental oxygen and wean down as tolerated. Remains "happily hypoxic." Continue proning and mobilizing.  Acute kidney injury.  Related to volume depletion with  hydrochlorothiazide and ARB use.  These medications have been held.  She received IV fluids with improvement of renal function.  Urine output is fair.  Continue to monitor.  Hypertension: Blood pressures running low.  Holding antihypertensives  Depression: Had a significant amount of anxiety while hospitalized.  - Continue scheduled low dose xanax started while inpatient. - Continue home zoloft -mood appears stable at this time  Morbid obesity: BMI 41.   T2DM, uncontrolled with hyperglycemia: HbA1c of 8.5%. Not on medications at home.  - Continue to titrate insulin, increase levemir further today to 25u qHS, continue mealtime insulin today. Will likely discharge on oral medications with PCP follow up once clinically improving.   Leukocytosis: Suspect stress demargination and steroid effect.  - Monitor for secondary infections.  Trending down  DVT prophylaxis: Lovenox Code Status: Full Family Communication: Daughter by phone during rounds Disposition Plan: CIR placement once stable  Consultants:   PCCM  Procedures:   None  Antimicrobials:  Remdesivir 6/11 - 6/15  Azithromycin 6/10   Subjective: No shortness of breath or cough.  Overall oxygen supplementation was increased overnight, but sensors noted to be on her finger.  Objective: Vitals:   10/21/18 1000 10/21/18 1144 10/21/18 1158 10/21/18 1200  BP: (!) 88/74  (!) 86/38 (!) 102/43  Pulse: 95  75 81  Resp: (!) 22  (!) 27 (!) 25  Temp:      TempSrc:      SpO2: (!) 89% 91% 94% (!) 88%  Weight:      Height:        Intake/Output Summary (Last 24 hours) at 10/21/2018 1206 Last data filed at 10/21/2018 0900 Gross per 24  hour  Intake 1273 ml  Output 950 ml  Net 323 ml   Filed Weights   10/10/18 2300  Weight: 108.7 kg   General exam: Alert, awake, oriented x 3 Respiratory system: Clear to auscultation. Respiratory effort normal. Cardiovascular system:RRR. No murmurs, rubs, gallops. Gastrointestinal system:  Abdomen is nondistended, soft and nontender. No organomegaly or masses felt. Normal bowel sounds heard. Central nervous system: Alert and oriented. No focal neurological deficits. Extremities: No C/C/E, +pedal pulses Skin: No rashes, lesions or ulcers Psychiatry: Judgement and insight appear normal. Mood & affect appropriate.    Data Reviewed: I have personally reviewed following labs and imaging studies  CBC: Recent Labs  Lab 10/17/18 0500 10/18/18 0630 10/19/18 0549 10/20/18 0050 10/21/18 0115  WBC 21.2* 20.6* 14.0* 12.9* 13.1*  HGB 14.8 15.5* 13.5 14.6 13.0  HCT 45.2 46.8* 41.9 43.2 40.3  MCV 94.0 93.8 93.1 92.9 94.4  PLT 480* 526* 403* 431* 330   Basic Metabolic Panel: Recent Labs  Lab 10/17/18 0500 10/18/18 0630 10/19/18 0549 10/20/18 0050 10/21/18 0115  NA 135 134* 136 136 138  K 3.9 3.9 3.6 3.5 3.5  CL 96* 97* 102 100 101  CO2 23 18* 23 28 27   GLUCOSE 244* 152* 119* 192* 109*  BUN 55* 77* 60* 56* 41*  CREATININE 1.13* 1.73* 1.19* 1.15* 0.98  CALCIUM 9.2 9.1 8.3* 8.5* 8.5*   GFR: Estimated Creatinine Clearance: 68 mL/min (by C-G formula based on SCr of 0.98 mg/dL). Liver Function Tests: Recent Labs  Lab 10/17/18 0500 10/18/18 0630 10/19/18 0549 10/20/18 0050 10/21/18 0115  AST 53* 33 36 28 25  ALT 68* 62* 59* 60* 52*  ALKPHOS 40 42 36* 40 40  BILITOT 0.8 0.7 0.9 0.7 0.6  PROT 8.1 7.6 6.3* 6.8 6.3*  ALBUMIN 3.3* 3.3* 2.7* 2.9* 2.8*   No results for input(s): LIPASE, AMYLASE in the last 168 hours. No results for input(s): AMMONIA in the last 168 hours. Coagulation Profile: No results for input(s): INR, PROTIME in the last 168 hours. Cardiac Enzymes: No results for input(s): CKTOTAL, CKMB, CKMBINDEX, TROPONINI in the last 168 hours. BNP (last 3 results) No results for input(s): PROBNP in the last 8760 hours. HbA1C: No results for input(s): HGBA1C in the last 72 hours. CBG: Recent Labs  Lab 10/20/18 1253 10/20/18 1857 10/20/18 2107 10/21/18  0735 10/21/18 1152  GLUCAP 170* 152* 151* 109* 145*   Lipid Profile: No results for input(s): CHOL, HDL, LDLCALC, TRIG, CHOLHDL, LDLDIRECT in the last 72 hours. Thyroid Function Tests: No results for input(s): TSH, T4TOTAL, FREET4, T3FREE, THYROIDAB in the last 72 hours. Anemia Panel: Recent Labs    10/20/18 0050 10/21/18 0115  FERRITIN 582* 535*   Urine analysis:    Component Value Date/Time   COLORURINE YELLOW (A) 10/10/2018 2039   APPEARANCEUR CLOUDY (A) 10/10/2018 2039   LABSPEC 1.020 10/10/2018 2039   PHURINE 5.0 10/10/2018 2039   GLUCOSEU NEGATIVE 10/10/2018 2039   HGBUR NEGATIVE 10/10/2018 2039   BILIRUBINUR NEGATIVE 10/10/2018 2039   KETONESUR NEGATIVE 10/10/2018 2039   PROTEINUR 30 (A) 10/10/2018 2039   NITRITE NEGATIVE 10/10/2018 2039   LEUKOCYTESUR TRACE (A) 10/10/2018 2039   Recent Results (from the past 240 hour(s))  MRSA PCR Screening     Status: None   Collection Time: 10/16/18  4:40 PM   Specimen: Nasal Mucosa; Nasopharyngeal  Result Value Ref Range Status   MRSA by PCR NEGATIVE NEGATIVE Final    Comment:  The GeneXpert MRSA Assay (FDA approved for NASAL specimens only), is one component of a comprehensive MRSA colonization surveillance program. It is not intended to diagnose MRSA infection nor to guide or monitor treatment for MRSA infections. Performed at [facility], [address] [place], [place] [id number]       Radiology Studies: No results found.  Scheduled Meds: . sodium chloride   Intravenous Once  . ALPRAZolam  0.25 mg Oral Daily  . Chlorhexidine Gluconate Cloth  6 each Topical Q0600  . enoxaparin (LOVENOX) injection  0.5 mg/kg Subcutaneous Q12H  . insulin aspart  0-20 Units Subcutaneous TID WC  . insulin aspart  0-5 Units Subcutaneous QHS  . insulin aspart  4 Units Subcutaneous TID WC  . insulin detemir  25 Units Subcutaneous QHS  . sertraline  50 mg Oral Daily  . sodium chloride flush  3 mL  Intravenous Q12H   Continuous Infusions: . sodium chloride Stopped (10/12/18 1042)     LOS: 11 days   Critical care Time spent: 35 minutes.  Erick BlinksJehanzeb Memon, MD [facility] [email or web address] Password [facility] 10/21/2018, 12:06 PM

## 2018-10-21 NOTE — Progress Notes (Signed)
NAME:  Christina Clark, MRN:  [id number], DOB:  24-Aug-1950, LOS: 11 ADMISSION DATE:  10/10/2018, CONSULTATION DATE:  10/11/2018 REFERRING MD:  Jarvis NewcomerGrunz, CHIEF COMPLAINT:  dyspnea   Brief History   68 year old morbidly obese female admitted for severe acute respiratory failure with hypoxemia due to COVID-19 pneumonia.  Past Medical History  Hypertension Morbid obesity Depression  Significant Hospital Events   Admission June 11  Consults:  Pulmonary and critical care medicine  Procedures:    Significant Diagnostic Tests:    Micro Data:  6/10 SARS COV-2 > positive 6/10 blood culture 6/10 urine culture  Antimicrobials:  6/12 Remdesivir > 6/15  6/11 Actemra x1 6/11 solumedrol  6/19 convalescent plasma   Interim history/subjective:  No events overnight, no new complaints  Objective   Blood pressure (!) 128/57, pulse 67, temperature 98.4 F (36.9 C), temperature source Axillary, resp. rate (!) 26, height 5\' 4"  (1.626 m), weight 108.7 kg, SpO2 91 %.    FiO2 (%):  [60 %-100 %] 90 %   Intake/Output Summary (Last 24 hours) at 10/21/2018 0820 Last data filed at 10/21/2018 0500 Gross per 24 hour  Intake 1070 ml  Output 1451 ml  Net -381 ml   Filed Weights   10/10/18 2300  Weight: 108.7 kg   Examination:  General:  Well appearing, NAD HENT: [place]/AT, PERRL, EOM-I and MMM, HFNC in place PULM: CTA bilaterally CV: RRR, Nl S1/S2 and -M/R/G GI: Soft, NT, ND and +BS MSK: WNL Neuro: Alert and interactive, moving all ext to command  I reviewed CXR myself, infiltrate noted  Resolved Hospital Problem list     Assessment & Plan:  ARDS due to COVID-19 pneumonia: Oxygenation remains poor, however the patient does not have complaints of shortness of breath Continue to monitor carefully in ICU Titrate O2 for sat of 88-92% No need for intubation at this time but keep in the ICU given O2 demand and high likelihood of requiring intubation OOB Awake proning PT as able  Continue heated high flow, weaning off FiO2 as able  Best practice:  Diet: diabetic diet Pain/Anxiety/Delirium protocol (if indicated): n/a VAP protocol (if indicated): n/a DVT prophylaxis: Lovenox per COVID protocol GI prophylaxis: n/a Glucose control: SSI Mobility: out of bed as tolerated Code Status: full Family Communication: Daughter updated by phone on 6/19 Disposition: remain in ICU  Labs   CBC: Recent Labs  Lab 10/17/18 0500 10/18/18 0630 10/19/18 0549 10/20/18 0050 10/21/18 0115  WBC 21.2* 20.6* 14.0* 12.9* 13.1*  HGB 14.8 15.5* 13.5 14.6 13.0  HCT 45.2 46.8* 41.9 43.2 40.3  MCV 94.0 93.8 93.1 92.9 94.4  PLT 480* 526* 403* 431* 330    Basic Metabolic Panel: Recent Labs  Lab 10/17/18 0500 10/18/18 0630 10/19/18 0549 10/20/18 0050 10/21/18 0115  NA 135 134* 136 136 138  K 3.9 3.9 3.6 3.5 3.5  CL 96* 97* 102 100 101  CO2 23 18* 23 28 27   GLUCOSE 244* 152* 119* 192* 109*  BUN 55* 77* 60* 56* 41*  CREATININE 1.13* 1.73* 1.19* 1.15* 0.98  CALCIUM 9.2 9.1 8.3* 8.5* 8.5*   GFR: Estimated Creatinine Clearance: 68 mL/min (by C-G formula based on SCr of 0.98 mg/dL). Recent Labs  Lab 10/18/18 0630 10/19/18 0549 10/20/18 0050 10/21/18 0115  WBC 20.6* 14.0* 12.9* 13.1*    Liver Function Tests: Recent Labs  Lab 10/17/18 0500 10/18/18 0630 10/19/18 0549 10/20/18 0050 10/21/18 0115  AST 53* 33 36 28 25  ALT 68* 62* 59* 60* 52*  ALKPHOS 40 42 36* 40 40  BILITOT 0.8 0.7 0.9 0.7 0.6  PROT 8.1 7.6 6.3* 6.8 6.3*  ALBUMIN 3.3* 3.3* 2.7* 2.9* 2.8*   No results for input(s): LIPASE, AMYLASE in the last 168 hours. No results for input(s): AMMONIA in the last 168 hours.  ABG    Component Value Date/Time   PHART 7.52 (H) 10/10/2018 1647   PCO2ART 34 10/10/2018 1647   PO2ART 53 (L) 10/10/2018 1647   HCO3 27.8 10/10/2018 1647   O2SAT 90.6 10/10/2018 1647    Coagulation Profile: No results for input(s): INR, PROTIME in the last 168 hours.  Cardiac  Enzymes: No results for input(s): CKTOTAL, CKMB, CKMBINDEX, TROPONINI in the last 168 hours.  HbA1C: Hgb A1c MFr Bld  Date/Time Value Ref Range Status  10/11/2018 05:50 AM 8.1 (H) 4.8 - 5.6 % Final    Comment:    (NOTE) Pre diabetes:          5.7%-6.4% Diabetes:              >6.4% Glycemic control for   <7.0% adults with diabetes    CBG: Recent Labs  Lab 10/20/18 0752 10/20/18 1253 10/20/18 1857 10/20/18 2107 10/21/18 0735  GLUCAP 141* 170* 152* 151* 109*   The patient is critically ill with multiple organ systems failure and requires high complexity decision making for assessment and support, frequent evaluation and titration of therapies, application of advanced monitoring technologies and extensive interpretation of multiple databases.   Critical Care Time devoted to patient care services described in this note is  32  Minutes. This time reflects time of care of this signee Dr Jennet Maduro. This critical care time does not reflect procedure time, or teaching time or supervisory time of PA/NP/Med student/Med Resident etc but could involve care discussion time.  Rush Farmer, M.D. [facility] Pulmonary/Critical Care Medicine. Pager: [phone]. After hours pager: [phone].

## 2018-10-21 NOTE — Progress Notes (Signed)
Daughter called and updated. All questions answered and emotional support given. Will continue to monitor.

## 2018-10-21 NOTE — Progress Notes (Signed)
Occupational Therapy Evaluation Patient Details Name: Christina Clark MRN: [id number] DOB: [birth year] Today's Date: 10/21/2018    History of Present Illness 68 year old Spanish-speaking female with past medical history of morbid obesity, hypertension and depression  presenting to the emergency room 10/10/18 with 1 week of fever, cough and shortness of breath in the setting of known COVID-19 contacts.  Patient found to be COVID positive as well as hypoxic with bilateral infiltrates on chest x-ray.  Requiring oxygen support of high flow   Clinical Impression   Daughter interpreted over the phone during session. PTA, pt independent with mobility and ADL without an AD. Pt seen while on 30L FiO2 70% and tolerated well. Pt desats into 80s with activity, however poor pleth. Currently requires Min A with limited mobility and Mod A with LB ADL. Pt tolerated will and was able to "calm" her breathing after exercise/activity. Very motivated to participate with therapy. At this time, recommend CIR for DC, however, pt may progress to being able to DC home with [facility]as her respiratory status improves. Pt issued theraband HEP to complete and verbalized understanding. Will continue to follow acutely.     Follow Up Recommendations  CIR;Supervision/Assistance - 24 hour    Equipment Recommendations  3 in 1 bedside commode    Recommendations for Other Services Rehab consult     Precautions / Restrictions Precautions Precautions: Fall Precaution Comments: monitor sats , VS on HFNC at 60% FiO2, sats drop quickly Restrictions Weight Bearing Restrictions: No      Mobility Bed Mobility               General bed mobility comments: OOB in chair  Transfers Overall transfer level: Needs assistance Equipment used: 1 person hand held assist Transfers: Sit to/from Stand Sit to Stand: Min assist         General transfer comment: Sit - stand and "marching" in place 10 steps    Balance Overall  balance assessment: Needs assistance   Sitting balance-Leahy Scale: Good       Standing balance-Leahy Scale: Poor Standing balance comment: reliant on external support                           ADL either performed or assessed with clinical judgement   ADL Overall ADL's : Needs assistance/impaired Eating/Feeding: Independent   Grooming: Set up;Sitting   Upper Body Bathing: Minimal assistance;Sitting   Lower Body Bathing: Moderate assistance;Sit to/from stand   Upper Body Dressing : Minimal assistance;Sitting   Lower Body Dressing: Moderate assistance;Sit to/from stand   Toilet Transfer: Minimal assistance   Toileting- Clothing Manipulation and Hygiene: Minimal assistance;Sit to/from stand       Functional mobility during ADLs: Minimal assistance(limited) General ADL Comments: Difficulty reaching feet; may benefit form AE; limited by respiratory status     Vision Baseline Vision/History: Wears glasses Wears Glasses: At all times       Perception     Praxis      Pertinent Vitals/Pain Pain Assessment: No/denies pain     Hand Dominance Right   Extremity/Trunk Assessment Upper Extremity Assessment Upper Extremity Assessment: Generalized weakness   Lower Extremity Assessment Lower Extremity Assessment: Defer to PT evaluation   Cervical / Trunk Assessment Cervical / Trunk Assessment: Normal   Communication Communication Communication: Prefers language other than [place];Interpreter utilized   Cognition Arousal/Alertness: Awake/alert Behavior During Therapy: WFL for tasks assessed/performed Overall Cognitive Status: Within Functional Limits for tasks assessed  General Comments  HOH    Exercises Exercises: Other exercises Other Exercises Other Exercises: Covid HEP  with level 1 theraband   Shoulder Instructions      Home Living Family/patient expects to be discharged to:: Private  residence Living Arrangements: Children Available Help at Discharge: Family;Available 24 hours/day Type of Home: House Home Access: Stairs to enter Enter[employer] CorporationEntrance Stairs-Number of Steps: 4   Home Layout: One level     Bathroom Shower/Tub: Chief Strategy OfficerTub/shower unit   Bathroom Toilet: Standard Bathroom Accessibility: Yes How Accessible: Accessible via walker Home Equipment: None          Prior Functioning/Environment Level of Independence: Independent                 OT Problem List: Decreased strength;Decreased activity tolerance;Impaired balance (sitting and/or standing);Decreased safety awareness;Decreased knowledge of use of DME or AE;Cardiopulmonary status limiting activity;Obesity      OT Treatment/Interventions: Self-care/ADL training;Therapeutic exercise;Neuromuscular education;Energy conservation;DME and/or AE instruction;Therapeutic activities;Patient/family education;Balance training    OT Goals(Current goals can be found in the care plan section) Acute Rehab OT Goals Patient Stated Goal: to be independent again and get stronger OT Goal Formulation: With patient Time For Goal Achievement: 11/04/18 Potential to Achieve Goals: Good  OT Frequency: Min 3X/week   Barriers to D/C:            Co-evaluation              AM-PAC OT "6 Clicks" Daily Activity     Outcome Measure Help from another person eating meals?: None Help from another person taking care of personal grooming?: A Little Help from another person toileting, which includes using toliet, bedpan, or urinal?: A Little Help from another person bathing (including washing, rinsing, drying)?: A Lot Help from another person to put on and taking off regular upper body clothing?: A Little Help from another person to put on and taking off regular lower body clothing?: A Lot 6 Click Score: 17   End of Session Equipment Utilized During Treatment: Oxygen(30L) Nurse Communication: Mobility status  Activity  Tolerance: Patient tolerated treatment well Patient left: in chair;with call bell/phone within reach  OT Visit Diagnosis: Unsteadiness on feet (R26.81);Muscle weakness (generalized) (M62.81)                Time: [id number] OT Time Calculation (min): 36 min Charges:  OT General Charges $OT Visit: 1 Visit OT Evaluation $OT Eval Moderate Complexity: 1 Mod OT Treatments $Self Care/Home Management : 8-22 mins  Christina Clark, OT/L   Acute OT Clinical Specialist Acute Rehabilitation Services Pager [phone] Office [phone]   [facility],HILLARY 10/21/2018, 11:12 AM

## 2018-10-22 ENCOUNTER — Inpatient Hospital Stay (HOSPITAL_COMMUNITY): Payer: Medicaid Other

## 2018-10-22 LAB — GLUCOSE, CAPILLARY
Glucose-Capillary: 122 mg/dL — ABNORMAL HIGH (ref 70–99)
Glucose-Capillary: 91 mg/dL (ref 70–99)
Glucose-Capillary: 219 mg/dL — ABNORMAL HIGH (ref 70–99)
Glucose-Capillary: 154 mg/dL — ABNORMAL HIGH (ref 70–99)
Glucose-Capillary: 166 mg/dL — ABNORMAL HIGH (ref 70–99)

## 2018-10-22 LAB — CBC
HCT: 40.4 % (ref 36.0–46.0)
Hemoglobin: 13.3 g/dL (ref 12.0–15.0)
MCH: 30.8 pg (ref 26.0–34.0)
MCHC: 32.9 g/dL (ref 30.0–36.0)
MCV: 93.5 fL (ref 80.0–100.0)
Platelets: 331 10*3/uL (ref 150–400)
RBC: 4.32 MIL/uL (ref 3.87–5.11)
RDW: 14.6 % (ref 11.5–15.5)
WBC: 13.8 10*3/uL — ABNORMAL HIGH (ref 4.0–10.5)
nRBC: 0 % (ref 0.0–0.2)

## 2018-10-22 LAB — COMPREHENSIVE METABOLIC PANEL
ALT: 47 U/L — ABNORMAL HIGH (ref 0–44)
AST: 18 U/L (ref 15–41)
Albumin: 3 g/dL — ABNORMAL LOW (ref 3.5–5.0)
Alkaline Phosphatase: 47 U/L (ref 38–126)
Anion gap: 12 (ref 5–15)
BUN: 39 mg/dL — ABNORMAL HIGH (ref 8–23)
CO2: 24 mmol/L (ref 22–32)
Calcium: 8.5 mg/dL — ABNORMAL LOW (ref 8.9–10.3)
Chloride: 100 mmol/L (ref 98–111)
Creatinine, Ser: 1 mg/dL (ref 0.44–1.00)
GFR calc Af Amer: >60 mL/min (ref >60)
GFR calc non Af Amer: 59 mL/min — ABNORMAL LOW (ref >60)
Glucose, Bld: 149 mg/dL — ABNORMAL HIGH (ref 70–99)
Potassium: 3.2 mmol/L — ABNORMAL LOW (ref 3.5–5.1)
Sodium: 136 mmol/L (ref 135–145)
Total Bilirubin: 0.8 mg/dL (ref 0.3–1.2)
Total Protein: 6.9 g/dL (ref 6.5–8.1)

## 2018-10-22 LAB — FERRITIN: Ferritin: 794 ng/mL — ABNORMAL HIGH (ref 11–307)

## 2018-10-22 LAB — LACTATE DEHYDROGENASE: LDH: 415 U/L — ABNORMAL HIGH (ref 98–192)

## 2018-10-22 LAB — D-DIMER, QUANTITATIVE: D-Dimer, Quant: 3.55 ug/mL-FEU — ABNORMAL HIGH (ref 0.00–0.50)

## 2018-10-22 LAB — C-REACTIVE PROTEIN: CRP: 1 mg/dL — ABNORMAL HIGH (ref <1.0)

## 2018-10-22 MED ORDER — POTASSIUM CHLORIDE CRYS ER 20 MEQ PO TBCR
40.0000 meq | EXTENDED_RELEASE_TABLET | Freq: Two times a day (BID) | ORAL | Status: AC
  Administered 2018-10-22 (×2): 40 meq via ORAL
  Filled 2018-10-22 (×2): qty 2

## 2018-10-22 NOTE — Progress Notes (Signed)
Physical Therapy Treatment Patient Details Name: Christina Clark MRN: [id number] DOB: August 24, 1950 Today's Date: 10/22/2018    History of Present Illness 68 year old Spanish-speaking female with past medical history of morbid obesity, hypertension and depression  presenting to the emergency room 10/10/18 with 1 week of fever, cough and shortness of breath in the setting of known COVID-19 contacts.  Patient found to be COVID positive as well as hypoxic with bilateral infiltrates on chest x-ray.  Requiring oxygen support of high flow    PT Comments    The patient continues to drop saturation with minimal activity, requires frequent rests to get sats back to >** % on 15 L HFNC. Continue  PT   Follow Up Recommendations  CIR     Equipment Recommendations  Rolling walker with 5" wheels    Recommendations for Other Services Rehab consult     Precautions / Restrictions Precautions Precautions: Fall Precaution Comments: monitor sats , VS on HFNC at 15 L sats drop into 60's    Mobility  Bed Mobility               General bed mobility comments: OOB in chair  Transfers Overall transfer level: Needs assistance Equipment used: Rolling walker (2 wheeled) Transfers: Sit to/from [employer] CorporationStand;Stand Pivot Transfers Sit to Stand: Min assist Stand pivot transfers: Min assist       General transfer comment: [facility] to recliner.  Ambulation/Gait             General Gait Details: unable   Stairs             Wheelchair Mobility    Modified Rankin (Stroke Patients Only)       Balance     Sitting balance-Leahy Scale: Good       Standing balance-Leahy Scale: Poor                              Cognition Arousal/Alertness: Awake/alert Behavior During Therapy: WFL for tasks assessed/performed Overall Cognitive Status: Within Functional Limits for tasks assessed                                 General Comments: daughter on phone  interpreting, patiet appears WFL, followed all directions      Exercises General Exercises - Lower Extremity Ankle Circles/Pumps: AROM Long Arc Quad: AROM;Both;10 reps Hip ABduction/ADduction: AROM;Both;10 reps Hip Flexion/Marching: AROM;Both;10 reps Other Exercises Other Exercises: sit - stnad x 3; 3 steps forward/backward x 3    General Comments        Pertinent Vitals/Pain Pain Assessment: No/denies pain    Home Living                      Prior Function            PT Goals (current goals can now be found in the care plan section) Acute Rehab PT Goals Patient Stated Goal: to be independent again and get stronger Progress towards PT goals: Progressing toward goals    Frequency    Min 3X/week      PT Plan Current plan remains appropriate    Co-evaluation              AM-PAC PT "6 Clicks" Mobility   Outcome Measure  Help needed turning from your back to your side while in a flat bed without using bedrails?: A Lot  Help needed moving from lying on your back to sitting on the side of a flat bed without using bedrails?: A Lot Help needed moving to and from a bed to a chair (including a wheelchair)?: A Lot Help needed standing up from a chair using your arms (e.g., wheelchair or bedside chair)?: A Lot Help needed to walk in hospital room?: A Lot Help needed climbing 3-5 steps with a railing? : Total 6 Click Score: 11    End of Session Equipment Utilized During Treatment: Oxygen Activity Tolerance: Treatment limited secondary to medical complications (Comment) Patient left: in chair;with call bell/phone within reach;with nursing/sitter in room Nurse Communication: Mobility status PT Visit Diagnosis: Difficulty in walking, not elsewhere classified (R26.2)     Time: 1425-1450 PT Time Calculation (min) (ACUTE ONLY): 25 min  Charges:  $Therapeutic Exercise: 8-22 mins $Therapeutic Activity: 8-22 mins                     [place] Pager [phone] Office [phone]    [name] 10/22/2018, 6:23 PM

## 2018-10-22 NOTE — Progress Notes (Signed)
PROGRESS NOTE  Christina Clark  HQI:[id number]:[id number] DOB: [birth year] DOA: 10/10/2018 PCP: Patient, No Pcp Per   Brief Narrative: 68 year old Spanish-speaking female with past medical history of morbid obesity, hypertension and depression admitted to the hospitalist service [facility] presenting to the emergency room with 1 week of fever, cough and shortness of breath in the setting of known COVID-19 contacts.  Patient found to be COVID positive as well as hypoxic with bilateral infiltrates on chest x-ray.  Requiring oxygen support of high flow.  Started on Remdisivir, steroids and given Actemra x1.  No history of diabetes, but noted to be hyperglycemic.  A1c checked and patient found to have A1c of 8.5, and started on low-dose Levemir plus sliding scale. Inflammatory markers have decreased, though she continues to require high levels of oxygen.  Assessment & Plan: Principal Problem:   Acute respiratory failure with hypoxia (HCC) Active Problems:   Hypertension   Depression   Acute respiratory disease due to COVID-19 virus   Morbid obesity with BMI of 40.0-44.9, adult (HCC)   Diabetes mellitus type 2, uncontrolled (HCC)  Acute respiratory failure with hypoxia due to COVID-19 pneumonia: s/p actemra x1 dose - Completed 5 days remdesivir (6/11 - 6/15) -Course of steroids will be completed tonight -Received convalescent plasma on 6/19. - Monitor I/O maintain even/negative balance. Give lasix prn. - Avoid NSAIDs -Continue to titrate oxygen down.  She has been taken off of heated high flow and is on high flow nasal cannula.  Continue to titrate down. - Continue supplemental oxygen and wean down as tolerated. Remains "happily hypoxic." Continue proning and mobilizing.  Acute kidney injury.  Related to volume depletion with hydrochlorothiazide and ARB use.  These medications have been held.  She received IV fluids with improvement of renal function.  Urine output is fair.  Continue to  monitor.  Hypertension: Blood pressures running low.  Holding antihypertensives  Depression: Had a significant amount of anxiety while hospitalized.  - Continue scheduled low dose xanax started while inpatient. - Continue home zoloft -mood appears stable at this time  Morbid obesity: BMI 41.   T2DM, uncontrolled with hyperglycemia: HbA1c of 8.5%. Not on medications at home.  -Blood sugars have been relatively stable.  Will likely discharge on oral medications with PCP follow up once clinically improving.   Leukocytosis: Suspect stress demargination and steroid effect.  - Monitor for secondary infections.  Trending down  DVT prophylaxis: Lovenox Code Status: Full Family Communication: Daughter by phone during rounds Disposition Plan: CIR placement once stable  Consultants:   PCCM  Procedures:   None  Antimicrobials:  Remdesivir 6/11 - 6/15  Azithromycin 6/10   Subjective: Denies any shortness of breath or cough.  She is feeling better.  Still becomes very hypoxic on exertion.  Objective: Vitals:   10/22/18 1500 10/22/18 1541 10/22/18 1600 10/22/18 1700  BP: (!) 106/57 (!) 106/57 (!) 130/54 (!) 106/51  Pulse: 99 84 (!) 109 72  Resp: (!) 23 (!) 34 20 (!) 28  Temp:   98 F (36.7 C)   TempSrc:   Oral   SpO2: 91% 91% (!) 88% 90%  Weight:      Height:        Intake/Output Summary (Last 24 hours) at 10/22/2018 1726 Last data filed at 10/22/2018 1400 Gross per 24 hour  Intake 730 ml  Output 1400 ml  Net -670 ml   Filed Weights   10/10/18 2300  Weight: 108.7 kg   General exam: Alert,  awake, oriented x 3 Respiratory system: Clear to auscultation. Respiratory effort normal. Cardiovascular system:RRR. No murmurs, rubs, gallops. Gastrointestinal system: Abdomen is nondistended, soft and nontender. No organomegaly or masses felt. Normal bowel sounds heard. Central nervous system: Alert and oriented. No focal neurological deficits. Extremities: No C/C/E, +pedal  pulses Skin: No rashes, lesions or ulcers Psychiatry: Judgement and insight appear normal. Mood & affect appropriate.   Data Reviewed: I have personally reviewed following labs and imaging studies  CBC: Recent Labs  Lab 10/18/18 0630 10/19/18 0549 10/20/18 0050 10/21/18 0115 10/22/18 0115  WBC 20.6* 14.0* 12.9* 13.1* 13.8*  HGB 15.5* 13.5 14.6 13.0 13.3  HCT 46.8* 41.9 43.2 40.3 40.4  MCV 93.8 93.1 92.9 94.4 93.5  PLT 526* 403* 431* 330 331   Basic Metabolic Panel: Recent Labs  Lab 10/18/18 0630 10/19/18 0549 10/20/18 0050 10/21/18 0115 10/22/18 0115  NA 134* 136 136 138 136  K 3.9 3.6 3.5 3.5 3.2*  CL 97* 102 100 101 100  CO2 18* 23 28 27 24   GLUCOSE 152* 119* 192* 109* 149*  BUN 77* 60* 56* 41* 39*  CREATININE 1.73* 1.19* 1.15* 0.98 1.00  CALCIUM 9.1 8.3* 8.5* 8.5* 8.5*   GFR: Estimated Creatinine Clearance: 66.7 mL/min (by C-G formula based on SCr of 1 mg/dL). Liver Function Tests: Recent Labs  Lab 10/18/18 0630 10/19/18 0549 10/20/18 0050 10/21/18 0115 10/22/18 0115  AST 33 36 28 25 18   ALT 62* 59* 60* 52* 47*  ALKPHOS 42 36* 40 40 47  BILITOT 0.7 0.9 0.7 0.6 0.8  PROT 7.6 6.3* 6.8 6.3* 6.9  ALBUMIN 3.3* 2.7* 2.9* 2.8* 3.0*   No results for input(s): LIPASE, AMYLASE in the last 168 hours. No results for input(s): AMMONIA in the last 168 hours. Coagulation Profile: No results for input(s): INR, PROTIME in the last 168 hours. Cardiac Enzymes: No results for input(s): CKTOTAL, CKMB, CKMBINDEX, TROPONINI in the last 168 hours. BNP (last 3 results) No results for input(s): PROBNP in the last 8760 hours. HbA1C: No results for input(s): HGBA1C in the last 72 hours. CBG: Recent Labs  Lab 10/21/18 1724 10/21/18 2109 10/22/18 0733 10/22/18 1153 10/22/18 1625  GLUCAP 139* 206* 91 219* 154*   Lipid Profile: No results for input(s): CHOL, HDL, LDLCALC, TRIG, CHOLHDL, LDLDIRECT in the last 72 hours. Thyroid Function Tests: No results for input(s):  TSH, T4TOTAL, FREET4, T3FREE, THYROIDAB in the last 72 hours. Anemia Panel: Recent Labs    10/21/18 0115 10/22/18 0115  FERRITIN 535* 794*   Urine analysis:    Component Value Date/Time   COLORURINE YELLOW (A) 10/10/2018 2039   APPEARANCEUR CLOUDY (A) 10/10/2018 2039   LABSPEC 1.020 10/10/2018 2039   PHURINE 5.0 10/10/2018 2039   GLUCOSEU NEGATIVE 10/10/2018 2039   HGBUR NEGATIVE 10/10/2018 2039   BILIRUBINUR NEGATIVE 10/10/2018 2039   KETONESUR NEGATIVE 10/10/2018 2039   PROTEINUR 30 (A) 10/10/2018 2039   NITRITE NEGATIVE 10/10/2018 2039   LEUKOCYTESUR TRACE (A) 10/10/2018 2039   Recent Results (from the past 240 hour(s))  MRSA PCR Screening     Status: None   Collection Time: 10/16/18  4:40 PM   Specimen: Nasal Mucosa; Nasopharyngeal  Result Value Ref Range Status   MRSA by PCR NEGATIVE NEGATIVE Final    Comment:        The GeneXpert MRSA Assay (FDA approved for NASAL specimens only), is one component of a comprehensive MRSA colonization surveillance program. It is not intended to diagnose MRSA infection nor to  guide or monitor treatment for MRSA infections. Performed at [facility], [place] [address], [place], [place] [address]       Radiology Studies: Dg Chest Port 1 View  Result Date: 10/22/2018 CLINICAL DATA:  Pneumonia. EXAM: PORTABLE CHEST 1 VIEW COMPARISON:  Radiograph of October 10, 2018. FINDINGS: The heart size and mediastinal contours are within normal limits. No pneumothorax or pleural effusion is noted. Slightly improved bilateral lung opacities are noted consistent with multifocal pneumonia. The visualized skeletal structures are unremarkable. IMPRESSION: Slightly improved multifocal pneumonia. Electronically Signed   By: Marijo Conception M.D.   On: 10/22/2018 11:32    Scheduled Meds: . sodium chloride   Intravenous Once  . ALPRAZolam  0.25 mg Oral Daily  . Chlorhexidine Gluconate Cloth  6 each Topical Q0600  . enoxaparin (LOVENOX)  injection  0.5 mg/kg Subcutaneous Q12H  . insulin aspart  0-20 Units Subcutaneous TID WC  . insulin aspart  0-5 Units Subcutaneous QHS  . insulin aspart  4 Units Subcutaneous TID WC  . insulin detemir  25 Units Subcutaneous QHS  . potassium chloride  40 mEq Oral BID  . sertraline  50 mg Oral Daily  . sodium chloride flush  3 mL Intravenous Q12H   Continuous Infusions: . sodium chloride Stopped (10/12/18 1042)     LOS: 12 days   Time spent: 35 minutes.  Kathie Dike, MD [facility] [email or web address] Password [facility] 10/22/2018, 5:26 PM

## 2018-10-22 NOTE — Progress Notes (Signed)
Family called and spoke to the pt. Updated.

## 2018-10-22 NOTE — Progress Notes (Signed)
Occupational Therapy Treatment Patient Details Name: Christina Clark MRN: [id number] DOB: May 27, 1950 Today's Date: 10/22/2018    History of present illness 68 year old Spanish-speaking female with past medical history of morbid obesity, hypertension and depression  presenting to the emergency room 10/10/18 with 1 week of fever, cough and shortness of breath in the setting of known COVID-19 contacts.  Patient found to be COVID positive as well as hypoxic with bilateral infiltrates on chest x-ray.  Requiring oxygen support of high flow   OT comments  Pt making steady progress. Able to ambulate 12 steps x 3 repetitions on 15 L with rest breaks in between activity to recover. Pt is dyspnic with desat in low 80s (unreliable pleth) but recovers in several minutes. Is very motivated to work with therapy. At this time, continue to recommend CIR, however pt may progress to home once respiratory status improves. Will continue to follow.  HR max 101; BP 116/103; SpO2 82 - 92 15 L HFNC  Follow Up Recommendations  CIR;Supervision/Assistance - 24 hour    Equipment Recommendations  3 in 1 bedside commode    Recommendations for Other Services Rehab consult    Precautions / Restrictions Precautions Precautions: Fall Precaution Comments: monitor sats , VS on HFNC at 60% FiO2, sats drop quickly       Mobility Bed Mobility               General bed mobility comments: OOB in chair  Transfers Overall transfer level: Needs assistance Equipment used: Rolling walker (2 wheeled) Transfers: Sit to/from Stand Sit to Stand: Min assist         General transfer comment: sit - stand x 3    Balance     Sitting balance-Leahy Scale: Good       Standing balance-Leahy Scale: Poor                             ADL either performed or assessed with clinical judgement   ADL       Grooming: Set up;Sitting                   Toilet Transfer: Min guard;RW;BSC    Toileting- Clothing Manipulation and Hygiene: Moderate assistance(A due to fatigue)       Functional mobility during ADLs: Minimal assistance;Rolling walker       Vision       Perception     Praxis      Cognition Arousal/Alertness: Awake/alert Behavior During Therapy: WFL for tasks assessed/performed Overall Cognitive Status: Within Functional Limits for tasks assessed                                 General Comments: daughter on phone interpreting, patiet appears WFL, followed all directions        Exercises Other Exercises Other Exercises: sit - stnad x 3; 3 steps forward/backward x 3   Shoulder Instructions       General Comments      Pertinent Vitals/ Pain       Pain Assessment: No/denies pain  Home Living                                          Prior Functioning/Environment  Frequency  Min 3X/week        Progress Toward Goals  OT Goals(current goals can now be found in the care plan section)     Acute Rehab OT Goals Patient Stated Goal: to be independent again and get stronger OT Goal Formulation: With patient Time For Goal Achievement: 11/04/18 Potential to Achieve Goals: Good ADL Goals Pt Will Perform Upper Body Bathing: with modified independence;sitting Pt Will Perform Lower Body Bathing: with modified independence;sit to/from stand;with adaptive equipment Pt Will Perform Lower Body Dressing: with modified independence;sit to/from stand;with adaptive equipment Pt Will Transfer to Toilet: with modified independence;ambulating;bedside commode Pt Will Perform Toileting - Clothing Manipulation and hygiene: with modified independence;sit to/from stand Pt/caregiver will Perform Home Exercise Program: Increased strength;With theraband;Both right and left upper extremity;With Supervision;With written HEP provided Additional ADL Goal #1: Pt will independently verbalize 3 energy conservation  strategies for ADL  Plan Discharge plan remains appropriate    Co-evaluation                 AM-PAC OT "6 Clicks" Daily Activity     Outcome Measure   Help from another person eating meals?: None Help from another person taking care of personal grooming?: A Little Help from another person toileting, which includes using toliet, bedpan, or urinal?: A Little Help from another person bathing (including washing, rinsing, drying)?: A Lot Help from another person to put on and taking off regular upper body clothing?: A Little Help from another person to put on and taking off regular lower body clothing?: A Lot 6 Click Score: 17    End of Session Equipment Utilized During Treatment: Oxygen  OT Visit Diagnosis: Unsteadiness on feet (R26.81);Muscle weakness (generalized) (M62.81)   Activity Tolerance (15L)   Patient Left Other (comment)(on BSC with PT)   Nurse Communication Mobility status        Time: [id number] OT Time Calculation (min): 36 min  Charges: OT General Charges $OT Visit: 1 Visit OT Treatments $Self Care/Home Management : 8-22 mins $Therapeutic Activity: 8-22 mins  Christina Clark, OT/L   Acute OT Clinical Specialist Acute Rehabilitation Services Pager [phone] Office [phone]    [facility] 10/22/2018, 3:58 PM

## 2018-10-22 NOTE — Progress Notes (Signed)
NAME:  Christina Clark, MRN:  [id number], DOB:  15-May-1950, LOS: 12 ADMISSION DATE:  10/10/2018, CONSULTATION DATE:  10/11/2018 REFERRING MD:  Bonner Puna, CHIEF COMPLAINT:  dyspnea   Brief History   68 year old morbidly obese female admitted for severe acute respiratory failure with hypoxemia due to COVID-19 pneumonia.  Past Medical History  Hypertension Morbid obesity Depression  Significant Hospital Events   6/11 admission.  Consults:  PCCM  Procedures:  nil  Significant Diagnostic Tests:    Micro Data:  6/10 SARS COV-2 > positive 6/10 blood culture 6/10 urine culture  Antimicrobials:  6/12 Remdesivir > 6/15  6/11 Actemra x1 6/11 solumedrol  6/19 convalescent plasma   Interim history/subjective:  No events overnight, no new complaints. Feels well.  Objective   Blood pressure 107/62, pulse (!) 102, temperature 98.3 F (36.8 C), temperature source Oral, resp. rate (!) 33, height 5\' 4"  (1.626 m), weight 108.7 kg, SpO2 91 %.    FiO2 (%):  [40 %-60 %] 40 %   Intake/Output Summary (Last 24 hours) at 10/22/2018 1033 Last data filed at 10/22/2018 1000 Gross per 24 hour  Intake 730 ml  Output 950 ml  Net -220 ml   Filed Weights   10/10/18 2300  Weight: 108.7 kg   Examination:  General:  Well appearing, NAD obese. HENT: [place]/AT, PERRL, EOM-I and MMM, HFNC in place PULM: bilateral crackles. CV: RRR, Nl S1/S2 and -M/R/G.  Unable to assess JVP GI: Soft, NT, ND and +BS MSK: WNL Neuro: Alert and interactive, moving all ext to command  Resolved Hospital Problem list     Assessment & Plan:  ARDS due to COVID-19 pneumonia: Oxygenation is improving, however the patient does not complain of shortness of breath Continue to monitor carefully in ICU Titrate O2 for sat of 88-92% Transfer once off Optiflo overnight Diuresis to maintain even to negative fluid balance  PT as able  Best practice:  Diet: diabetic diet Pain/Anxiety/Delirium protocol (if indicated): n/a  VAP protocol (if indicated): n/a DVT prophylaxis: Lovenox per COVID protocol GI prophylaxis: n/a Glucose control: SSI Mobility: out of bed as tolerated Code Status: full Family Communication: Daughter updated by phone on 6/19 Disposition: remain in ICU -transfer tomorrow if continues to improve.  Labs   CBC: Recent Labs  Lab 10/18/18 0630 10/19/18 0549 10/20/18 0050 10/21/18 0115 10/22/18 0115  WBC 20.6* 14.0* 12.9* 13.1* 13.8*  HGB 15.5* 13.5 14.6 13.0 13.3  HCT 46.8* 41.9 43.2 40.3 40.4  MCV 93.8 93.1 92.9 94.4 93.5  PLT 526* 403* 431* 330 277    Basic Metabolic Panel: Recent Labs  Lab 10/18/18 0630 10/19/18 0549 10/20/18 0050 10/21/18 0115 10/22/18 0115  NA 134* 136 136 138 136  K 3.9 3.6 3.5 3.5 3.2*  CL 97* 102 100 101 100  CO2 18* 23 28 27 24   GLUCOSE 152* 119* 192* 109* 149*  BUN 77* 60* 56* 41* 39*  CREATININE 1.73* 1.19* 1.15* 0.98 1.00  CALCIUM 9.1 8.3* 8.5* 8.5* 8.5*   GFR: Estimated Creatinine Clearance: 66.7 mL/min (by C-G formula based on SCr of 1 mg/dL). Recent Labs  Lab 10/19/18 0549 10/20/18 0050 10/21/18 0115 10/22/18 0115  WBC 14.0* 12.9* 13.1* 13.8*    Liver Function Tests: Recent Labs  Lab 10/18/18 0630 10/19/18 0549 10/20/18 0050 10/21/18 0115 10/22/18 0115  AST 33 36 28 25 18   ALT 62* 59* 60* 52* 47*  ALKPHOS 42 36* 40 40 47  BILITOT 0.7 0.9 0.7 0.6 0.8  PROT 7.6 6.3*  6.8 6.3* 6.9  ALBUMIN 3.3* 2.7* 2.9* 2.8* 3.0*   No results for input(s): LIPASE, AMYLASE in the last 168 hours. No results for input(s): AMMONIA in the last 168 hours.  ABG    Component Value Date/Time   PHART 7.52 (H) 10/10/2018 1647   PCO2ART 34 10/10/2018 1647   PO2ART 53 (L) 10/10/2018 1647   HCO3 27.8 10/10/2018 1647   O2SAT 90.6 10/10/2018 1647    Coagulation Profile: No results for input(s): INR, PROTIME in the last 168 hours.  Cardiac Enzymes: No results for input(s): CKTOTAL, CKMB, CKMBINDEX, TROPONINI in the last 168 hours.  HbA1C:  Hgb A1c MFr Bld  Date/Time Value Ref Range Status  10/11/2018 05:50 AM 8.1 (H) 4.8 - 5.6 % Final    Comment:    (NOTE) Pre diabetes:          5.7%-6.4% Diabetes:              >6.4% Glycemic control for   <7.0% adults with diabetes    CBG: Recent Labs  Lab 10/21/18 0735 10/21/18 1152 10/21/18 1724 10/21/18 2109 10/22/18 0733  GLUCAP 109* 145* 139* 206* 91   Lynnell Catalanavi Timber Marshman, MD [facility] ICU Physician [facility]  Pager: [phone] Mobile: [phone] After hours: [phone].  10/22/2018, 10:37 AM

## 2018-10-23 LAB — BASIC METABOLIC PANEL
Anion gap: 12 (ref 5–15)
BUN: 33 mg/dL — ABNORMAL HIGH (ref 8–23)
CO2: 24 mmol/L (ref 22–32)
Calcium: 8.7 mg/dL — ABNORMAL LOW (ref 8.9–10.3)
Chloride: 101 mmol/L (ref 98–111)
Creatinine, Ser: 0.91 mg/dL (ref 0.44–1.00)
GFR calc Af Amer: >60 mL/min (ref >60)
GFR calc non Af Amer: >60 mL/min (ref >60)
Glucose, Bld: 151 mg/dL — ABNORMAL HIGH (ref 70–99)
Potassium: 3.9 mmol/L (ref 3.5–5.1)
Sodium: 137 mmol/L (ref 135–145)

## 2018-10-23 LAB — GLUCOSE, CAPILLARY
Glucose-Capillary: 108 mg/dL — ABNORMAL HIGH (ref 70–99)
Glucose-Capillary: 251 mg/dL — ABNORMAL HIGH (ref 70–99)
Glucose-Capillary: 191 mg/dL — ABNORMAL HIGH (ref 70–99)
Glucose-Capillary: 142 mg/dL — ABNORMAL HIGH (ref 70–99)

## 2018-10-23 LAB — LACTATE DEHYDROGENASE: LDH: 473 U/L — ABNORMAL HIGH (ref 98–192)

## 2018-10-23 LAB — C-REACTIVE PROTEIN: CRP: <0.8 mg/dL (ref <1.0)

## 2018-10-23 LAB — D-DIMER, QUANTITATIVE: D-Dimer, Quant: 5.81 ug/mL-FEU — ABNORMAL HIGH (ref 0.00–0.50)

## 2018-10-23 LAB — FERRITIN: Ferritin: 394 ng/mL — ABNORMAL HIGH (ref 11–307)

## 2018-10-23 NOTE — Progress Notes (Signed)
Inpatient Rehab Admissions:  Inpatient Rehab Consult received.  Note that pt's most recent (+) test was on 10/10/2018, with no update since that time.  Also note pt continues on high flow O2 and currently min assist.  Will continue to follow at a distance until patient meets current CDC guidelines for discharge to a facility and determine at that time whether she remains appropriate for CIR or if she could safely d/c home with [facility] services.   Shann Medal, PT, DPT Admissions Coordinator [phone] 10/23/18  12:41 PM

## 2018-10-23 NOTE — Progress Notes (Signed)
PROGRESS NOTE  Christina Clark  RUE:[id number] DOB: 08/10/1950 DOA: 10/10/2018 PCP: Patient, No Pcp Per   Brief Narrative: 68 year old Spanish-speaking female with past medical history of morbid obesity, hypertension and depression admitted to the hospitalist service [facility] presenting to the emergency room with 1 week of fever, cough and shortness of breath in the setting of known COVID-19 contacts.  Patient found to be COVID positive as well as hypoxic with bilateral infiltrates on chest x-ray.  Requiring oxygen support of high flow.  Started on Remdisivir, steroids and given Actemra x1.  No history of diabetes, but noted to be hyperglycemic.  A1c checked and patient found to have A1c of 8.5, and started on low-dose Levemir plus sliding scale.  She was monitored in the ICU since she was requiring heated high flow oxygen.  This has since been weaned down to high flow oxygen.  She is improving will be transferred to the progressive care unit.  Assessment & Plan: Principal Problem:   Acute respiratory failure with hypoxia (HCC) Active Problems:   Hypertension   Depression   Acute respiratory disease due to COVID-19 virus   Morbid obesity with BMI of 40.0-44.9, adult (HCC)   Diabetes mellitus type 2, uncontrolled (HCC)  Acute respiratory failure with hypoxia due to COVID-19 pneumonia: s/p actemra x1 dose - Completed 5 days remdesivir (6/11 - 6/15) -Course of steroids will be completed tonight -Received convalescent plasma on 6/19. - Monitor I/O maintain even/negative balance. Give lasix prn. - Avoid NSAIDs -Continue to titrate oxygen down.  She is no longer requiring heated high flow oxygen via nasal cannula.  Currently on 8 L of oxygen via high flow nasal cannula.  We will keep oxygen saturations above 85%. - Continue supplemental oxygen and wean down as tolerated. Remains "happily hypoxic." Continue proning and mobilizing.  Acute kidney injury.  Related to volume  depletion with hydrochlorothiazide and ARB use.  These medications have been held.  She received IV fluids with improvement of renal function.  Urine output is fair.  Continue to monitor.  Hypertension: Blood pressures running low.  Holding antihypertensives  Depression: Had a significant amount of anxiety while hospitalized.  - Continue scheduled low dose xanax started while inpatient. - Continue home zoloft -mood appears stable at this time  Morbid obesity: BMI 41.   T2DM, uncontrolled with hyperglycemia: HbA1c of 8.5%. Not on medications at home.  -Blood sugars have been relatively stable.  Will likely discharge on oral medications with PCP follow up once clinically improving.   Leukocytosis: Suspect stress demargination and steroid effect.  - Monitor for secondary infections.  Trending down  DVT prophylaxis: Lovenox Code Status: Full Family Communication: Daughter by phone during rounds Disposition Plan: CIR placement once stable  Consultants:   PCCM  Procedures:   None  Antimicrobials:  Remdesivir 6/11 - 6/15  Azithromycin 6/10   Subjective: Feeling better today.  Denies any shortness of breath.  No significant cough.  Objective: Vitals:   10/23/18 0700 10/23/18 0800 10/23/18 0815 10/23/18 0900  BP: (!) 99/37 (!) 120/39 (!) 109/39   Pulse: 79 81 85 (!) 103  Resp: (!) 27 (!) 26 (!) 29 14  Temp:  98.2 F (36.8 C)    TempSrc:  Oral    SpO2: 90% (!) 89% 95% (!) 87%  Weight:      Height:        Intake/Output Summary (Last 24 hours) at 10/23/2018 1048 Last data filed at 10/23/2018 0854 Gross per 24  hour  Intake 246 ml  Output 800 ml  Net -554 ml   Filed Weights   10/10/18 2300  Weight: 108.7 kg   General exam: Alert, awake, oriented x 3 Respiratory system: Clear to auscultation. Respiratory effort normal. Cardiovascular system:RRR. No murmurs, rubs, gallops. Gastrointestinal system: Abdomen is nondistended, soft and nontender. No organomegaly or  masses felt. Normal bowel sounds heard. Central nervous system: Alert and oriented. No focal neurological deficits. Extremities: No C/C/E, +pedal pulses Skin: No rashes, lesions or ulcers Psychiatry: Judgement and insight appear normal. Mood & affect appropriate.  .   Data Reviewed: I have personally reviewed following labs and imaging studies  CBC: Recent Labs  Lab 10/18/18 0630 10/19/18 0549 10/20/18 0050 10/21/18 0115 10/22/18 0115  WBC 20.6* 14.0* 12.9* 13.1* 13.8*  HGB 15.5* 13.5 14.6 13.0 13.3  HCT 46.8* 41.9 43.2 40.3 40.4  MCV 93.8 93.1 92.9 94.4 93.5  PLT 526* 403* 431* 330 767   Basic Metabolic Panel: Recent Labs  Lab 10/19/18 0549 10/20/18 0050 10/21/18 0115 10/22/18 0115 10/23/18 0030  NA 136 136 138 136 137  K 3.6 3.5 3.5 3.2* 3.9  CL 102 100 101 100 101  CO2 23 28 27 24 24   GLUCOSE 119* 192* 109* 149* 151*  BUN 60* 56* 41* 39* 33*  CREATININE 1.19* 1.15* 0.98 1.00 0.91  CALCIUM 8.3* 8.5* 8.5* 8.5* 8.7*   GFR: Estimated Creatinine Clearance: 73.2 mL/min (by C-G formula based on SCr of 0.91 mg/dL). Liver Function Tests: Recent Labs  Lab 10/18/18 0630 10/19/18 0549 10/20/18 0050 10/21/18 0115 10/22/18 0115  AST 33 36 28 25 18   ALT 62* 59* 60* 52* 47*  ALKPHOS 42 36* 40 40 47  BILITOT 0.7 0.9 0.7 0.6 0.8  PROT 7.6 6.3* 6.8 6.3* 6.9  ALBUMIN 3.3* 2.7* 2.9* 2.8* 3.0*   No results for input(s): LIPASE, AMYLASE in the last 168 hours. No results for input(s): AMMONIA in the last 168 hours. Coagulation Profile: No results for input(s): INR, PROTIME in the last 168 hours. Cardiac Enzymes: No results for input(s): CKTOTAL, CKMB, CKMBINDEX, TROPONINI in the last 168 hours. BNP (last 3 results) No results for input(s): PROBNP in the last 8760 hours. HbA1C: No results for input(s): HGBA1C in the last 72 hours. CBG: Recent Labs  Lab 10/22/18 0733 10/22/18 1153 10/22/18 1625 10/22/18 2028 10/23/18 0726  GLUCAP 91 219* 154* 166* 108*   Lipid  Profile: No results for input(s): CHOL, HDL, LDLCALC, TRIG, CHOLHDL, LDLDIRECT in the last 72 hours. Thyroid Function Tests: No results for input(s): TSH, T4TOTAL, FREET4, T3FREE, THYROIDAB in the last 72 hours. Anemia Panel: Recent Labs    10/22/18 0115 10/23/18 0030  FERRITIN 794* 394*   Urine analysis:    Component Value Date/Time   COLORURINE YELLOW (A) 10/10/2018 2039   APPEARANCEUR CLOUDY (A) 10/10/2018 2039   LABSPEC 1.020 10/10/2018 2039   PHURINE 5.0 10/10/2018 2039   GLUCOSEU NEGATIVE 10/10/2018 2039   HGBUR NEGATIVE 10/10/2018 2039   BILIRUBINUR NEGATIVE 10/10/2018 2039   [place] NEGATIVE 10/10/2018 2039   PROTEINUR 30 (A) 10/10/2018 2039   NITRITE NEGATIVE 10/10/2018 2039   LEUKOCYTESUR TRACE (A) 10/10/2018 2039   Recent Results (from the past 240 hour(s))  MRSA PCR Screening     Status: None   Collection Time: 10/16/18  4:40 PM   Specimen: Nasal Mucosa; Nasopharyngeal  Result Value Ref Range Status   MRSA by PCR NEGATIVE NEGATIVE Final    Comment:  The GeneXpert MRSA Assay (FDA approved for NASAL specimens only), is one component of a comprehensive MRSA colonization surveillance program. It is not intended to diagnose MRSA infection nor to guide or monitor treatment for MRSA infections. Performed at [facility], [address] [place], [place] [id number]       Radiology Studies: Dg Chest Port 1 View  Result Date: 10/22/2018 CLINICAL DATA:  Pneumonia. EXAM: PORTABLE CHEST 1 VIEW COMPARISON:  Radiograph of October 10, 2018. FINDINGS: The heart size and mediastinal contours are within normal limits. No pneumothorax or pleural effusion is noted. Slightly improved bilateral lung opacities are noted consistent with multifocal pneumonia. The visualized skeletal structures are unremarkable. IMPRESSION: Slightly improved multifocal pneumonia. Electronically Signed   By: Lupita RaiderJames  Green Jr M.D.   On: 10/22/2018 11:32    Scheduled Meds: .  sodium chloride   Intravenous Once  . ALPRAZolam  0.25 mg Oral Daily  . Chlorhexidine Gluconate Cloth  6 each Topical Q0600  . enoxaparin (LOVENOX) injection  0.5 mg/kg Subcutaneous Q12H  . insulin aspart  0-20 Units Subcutaneous TID WC  . insulin aspart  0-5 Units Subcutaneous QHS  . insulin aspart  4 Units Subcutaneous TID WC  . insulin detemir  25 Units Subcutaneous QHS  . sertraline  50 mg Oral Daily  . sodium chloride flush  3 mL Intravenous Q12H   Continuous Infusions: . sodium chloride Stopped (10/12/18 1042)     LOS: 13 days   Time spent: 35 minutes.  Erick BlinksJehanzeb Naketa Daddario, MD [facility] [email or web address] Password [email or web address] 10/23/2018, 10:48 AM

## 2018-10-23 NOTE — Progress Notes (Signed)
NAME:  Christina Clark, MRN:  [id number], DOB:  06/16/1950, LOS: 13 ADMISSION DATE:  10/10/2018, CONSULTATION DATE:  10/11/2018 REFERRING MD:  Jarvis NewcomerGrunz, CHIEF COMPLAINT:  dyspnea   Brief History   68 year old morbidly obese female admitted for severe acute respiratory failure with hypoxemia due to COVID-19 pneumonia.  Past Medical History  Hypertension Morbid obesity Depression  Significant Hospital Events   6/11 admission.  Consults:  PCCM  Procedures:  nil  Significant Diagnostic Tests:    Micro Data:  6/10 SARS COV-2 > positive 6/10 blood culture 6/10 urine culture  Antimicrobials:  6/12 Remdesivir > 6/15  6/11 Actemra x1 6/11 solumedrol  6/19 convalescent plasma   Interim history/subjective:  No events overnight, no new complaints. Feels well.  Objective   Blood pressure (!) 109/39, pulse (!) 103, temperature 98.2 F (36.8 C), temperature source Oral, resp. rate 14, height 5\' 4"  (1.626 m), weight 108.7 kg, SpO2 (!) 87 %.        Intake/Output Summary (Last 24 hours) at 10/23/2018 1020 Last data filed at 10/23/2018 0854 Gross per 24 hour  Intake 246 ml  Output 800 ml  Net -554 ml   Filed Weights   10/10/18 2300  Weight: 108.7 kg   Examination:  General:  Well appearing, NAD obese. HENT: [place]/AT, PERRL, EOM-I and MMM, HFNC in place PULM: bilateral crackles have essentially resolved. CV: RRR, Nl S1/S2 and -M/R/G.  JVP not elevated. GI: Soft, NT, ND and +BS MSK: WNL Neuro: Alert and interactive, moving all ext to command  Resolved Hospital Problem list     Assessment & Plan:  ARDS due to COVID-19 pneumonia: Oxygenation is improving, however the patient does not complain of shortness of breath Continue to monitor carefully in ICU Titrate O2 for sat of 88-92% Transfer once off Optiflo overnight Diuresis to maintain even to negative fluid balance  PT as able  Best practice:  Diet: diabetic diet Pain/Anxiety/Delirium protocol (if indicated): n/a  VAP protocol (if indicated): n/a DVT prophylaxis: Lovenox per COVID protocol GI prophylaxis: n/a Glucose control: SSI Mobility: out of bed as tolerated Code Status: full Family Communication: Daughter updated by phone on 6/19 Disposition: Transfer to PCU  Labs   CBC: Recent Labs  Lab 10/18/18 0630 10/19/18 0549 10/20/18 0050 10/21/18 0115 10/22/18 0115  WBC 20.6* 14.0* 12.9* 13.1* 13.8*  HGB 15.5* 13.5 14.6 13.0 13.3  HCT 46.8* 41.9 43.2 40.3 40.4  MCV 93.8 93.1 92.9 94.4 93.5  PLT 526* 403* 431* 330 331    Basic Metabolic Panel: Recent Labs  Lab 10/19/18 0549 10/20/18 0050 10/21/18 0115 10/22/18 0115 10/23/18 0030  NA 136 136 138 136 137  K 3.6 3.5 3.5 3.2* 3.9  CL 102 100 101 100 101  CO2 23 28 27 24 24   GLUCOSE 119* 192* 109* 149* 151*  BUN 60* 56* 41* 39* 33*  CREATININE 1.19* 1.15* 0.98 1.00 0.91  CALCIUM 8.3* 8.5* 8.5* 8.5* 8.7*   GFR: Estimated Creatinine Clearance: 73.2 mL/min (by C-G formula based on SCr of 0.91 mg/dL). Recent Labs  Lab 10/19/18 0549 10/20/18 0050 10/21/18 0115 10/22/18 0115  WBC 14.0* 12.9* 13.1* 13.8*    Liver Function Tests: Recent Labs  Lab 10/18/18 0630 10/19/18 0549 10/20/18 0050 10/21/18 0115 10/22/18 0115  AST 33 36 28 25 18   ALT 62* 59* 60* 52* 47*  ALKPHOS 42 36* 40 40 47  BILITOT 0.7 0.9 0.7 0.6 0.8  PROT 7.6 6.3* 6.8 6.3* 6.9  ALBUMIN 3.3* 2.7* 2.9* 2.8*  3.0*   No results for input(s): LIPASE, AMYLASE in the last 168 hours. No results for input(s): AMMONIA in the last 168 hours.  ABG    Component Value Date/Time   PHART 7.52 (H) 10/10/2018 1647   PCO2ART 34 10/10/2018 1647   PO2ART 53 (L) 10/10/2018 1647   HCO3 27.8 10/10/2018 1647   O2SAT 90.6 10/10/2018 1647    Coagulation Profile: No results for input(s): INR, PROTIME in the last 168 hours.  Cardiac Enzymes: No results for input(s): CKTOTAL, CKMB, CKMBINDEX, TROPONINI in the last 168 hours.  HbA1C: Hgb A1c MFr Bld  Date/Time Value Ref  Range Status  10/11/2018 05:50 AM 8.1 (H) 4.8 - 5.6 % Final    Comment:    (NOTE) Pre diabetes:          5.7%-6.4% Diabetes:              >6.4% Glycemic control for   <7.0% adults with diabetes    CBG: Recent Labs  Lab 10/22/18 0733 10/22/18 1153 10/22/18 1625 10/22/18 2028 10/23/18 0726  GLUCAP 91 219* 154* [place], MD [facility] ICU Physician [place]  Pager: [phone] Mobile: [phone] After hours: [phone].  10/23/2018, 10:20 AM

## 2018-10-23 NOTE — Progress Notes (Addendum)
Occupational Therapy Treatment Patient Details Name: Christina OuchMaria Clark Clark MRN: [id number] DOB: [birth year] Today's Date: 10/23/2018    History of present illness 68 year old Spanish-speaking female with past medical history of morbid obesity, hypertension and depression  presenting to the emergency room 10/10/18 with 1 week of fever, cough and shortness of breath in the setting of known COVID-19 contacts.  Patient found to be COVID positive as well as hypoxic with bilateral infiltrates on chest x-ray.  Requiring oxygen support of high flow   OT comments  Upon arrival, pt was sitting in recliner with SpO2 at 90-87% on 8L via HF , HR 120s, and RR 30s and pt with noted SOB. Pt requesting to return to bed and lie prone. Pt performing functional mobility to bed with Min A for stability and balance. Pt SpO2 dropping to 78% on 8L and RR 40s once in bed (sidelying); elevated to 10L. Pt requiring significant amount of time to recover. Pt positioning herself in prone and once again required increased time to recover. Upon OT leaving, pt prone and comfortable in bed and SpO2 90s on 8L via HFNC. RN notified. Continue to recommend dc to CIR and will continue to follow acutely as admitted.     Follow Up Recommendations  CIR;Supervision/Assistance - 24 hour    Equipment Recommendations  3 in 1 bedside commode    Recommendations for Other Services Rehab consult    Precautions / Restrictions Precautions Precautions: Fall Precaution Comments: monitor sats , VS on HFNC at 8-10 L sats drop into 70s Restrictions Weight Bearing Restrictions: No       Mobility Bed Mobility               General bed mobility comments: OOB in chair  Transfers Overall transfer level: Needs assistance Equipment used: Rolling walker (2 wheeled) Transfers: Sit to/from [employer]Stand;Stand Pivot Transfers Sit to Stand: Min assist         General transfer comment: Min A for balance and stability    Balance Overall  balance assessment: Needs assistance Sitting-balance support: No upper extremity supported;Feet supported Sitting balance-Leahy Scale: Good       Standing balance-Leahy Scale: Poor Standing balance comment: Min A for HHA                           ADL either performed or assessed with clinical judgement   ADL Overall ADL's : Needs assistance/impaired                         Toilet Transfer: Minimal assistance;Ambulation(simulated in room)           Functional mobility during ADLs: Minimal assistance(Short distance) General ADL Comments: Upon arrival, pt sitting in recliner and fatigued. Pt requesting to return to bed and lie prone.      Vision Baseline Vision/History: Wears glasses Wears Glasses: At all times     Perception     Praxis      Cognition Arousal/Alertness: Awake/alert Behavior During Therapy: [facility] for tasks assessed/performed Overall Cognitive Status: Within Functional Limits for tasks assessed                                 General Comments: daughter on phone interpreting, patiet appears [facility], followed all directions        Exercises     Shoulder Instructions       General  Comments Upon arrival, pt was in recliner and SpO2 91-88% on 8L, HR elevating to 110s, and RR in 30s. Daughter, Christina Clark, interpreting on the phone.    Pertinent Vitals/ Pain       Pain Assessment: No/denies pain  Home Living Family/patient expects to be discharged to:: Private residence Living Arrangements: Children Available Help at Discharge: Family;Available 24 hours/day Type of Home: House Home Access: Stairs to enter [employer] of Steps: 4   Home Layout: One level     Bathroom Shower/Tub: Teacher, early years/pre: Standard Bathroom Accessibility: Yes How Accessible: Accessible via walker Home Equipment: None          Prior Functioning/Environment Level of Independence: Independent             Frequency  Min 3X/week        Progress Toward Goals  OT Goals(current goals can now be found in the care plan section)  Progress towards OT goals: Progressing toward goals  Acute Rehab OT Goals Patient Stated Goal: to be independent again and get stronger OT Goal Formulation: With patient Time For Goal Achievement: 11/04/18 Potential to Achieve Goals: Good ADL Goals Pt Will Perform Upper Body Bathing: with modified independence;sitting Pt Will Perform Lower Body Bathing: with modified independence;sit to/from stand;with adaptive equipment Pt Will Perform Lower Body Dressing: with modified independence;sit to/from stand;with adaptive equipment Pt Will Transfer to Toilet: with modified independence;ambulating;bedside commode Pt Will Perform Toileting - Clothing Manipulation and hygiene: with modified independence;sit to/from stand Pt/caregiver will Perform Home Exercise Program: Increased strength;With theraband;Both right and left upper extremity;With Supervision;With written HEP provided Additional ADL Goal #1: Pt will independently verbalize 3 energy conservation strategies for ADL  Plan Discharge plan remains appropriate    Co-evaluation                 AM-PAC OT "6 Clicks" Daily Activity     Outcome Measure   Help from another person eating meals?: None Help from another person taking care of personal grooming?: A Little Help from another person toileting, which includes using toliet, bedpan, or urinal?: A Little Help from another person bathing (including washing, rinsing, drying)?: A Lot Help from another person to put on and taking off regular upper body clothing?: A Little Help from another person to put on and taking off regular lower body clothing?: A Lot 6 Click Score: 17    End of Session Equipment Utilized During Treatment: Oxygen(8-10L)  OT Visit Diagnosis: Unsteadiness on feet (R26.81);Muscle weakness (generalized) (M62.81)   Activity Tolerance  Patient limited by fatigue   Patient Left in bed;with call bell/phone within reach   Nurse Communication Mobility status        Time: 9563-8756 OT Time Calculation (min): 27 min  Charges: OT General Charges $OT Visit: 1 Visit OT Treatments $Self Care/Home Management : 23-37 mins  Estelle, OTR/L Acute Rehab Pager: [phone] Office: [place] 10/23/2018, 4:52 PM

## 2018-10-23 NOTE — Plan of Care (Signed)

## 2018-10-23 NOTE — Progress Notes (Signed)
Patient transferred into 103 PCU. She was able to transfer from wheelchair into the chair with assistance, her vitals are stable at this time. Daughter Bradlee on the phone and translated throughout transfer. Daughter was made aware of our family update process at this time, she also said to call her at any time with anything her mother needs.   Patient resting in chair at this time and looks comfortable.   Will continue to monitor patient.

## 2018-10-24 ENCOUNTER — Inpatient Hospital Stay (HOSPITAL_COMMUNITY): Payer: Medicaid Other

## 2018-10-24 LAB — GLUCOSE, CAPILLARY
Glucose-Capillary: 150 mg/dL — ABNORMAL HIGH (ref 70–99)
Glucose-Capillary: 283 mg/dL — ABNORMAL HIGH (ref 70–99)
Glucose-Capillary: 144 mg/dL — ABNORMAL HIGH (ref 70–99)
Glucose-Capillary: 176 mg/dL — ABNORMAL HIGH (ref 70–99)

## 2018-10-24 LAB — CBC
HCT: 39.7 % (ref 36.0–46.0)
Hemoglobin: 13 g/dL (ref 12.0–15.0)
MCH: 31.4 pg (ref 26.0–34.0)
MCHC: 32.7 g/dL (ref 30.0–36.0)
MCV: 95.9 fL (ref 80.0–100.0)
Platelets: 300 10*3/uL (ref 150–400)
RBC: 4.14 MIL/uL (ref 3.87–5.11)
RDW: 15.8 % — ABNORMAL HIGH (ref 11.5–15.5)
WBC: 25.3 10*3/uL — ABNORMAL HIGH (ref 4.0–10.5)
nRBC: 0.2 % (ref 0.0–0.2)

## 2018-10-24 LAB — FERRITIN: Ferritin: 262 ng/mL (ref 11–307)

## 2018-10-24 LAB — BASIC METABOLIC PANEL
Anion gap: 11 (ref 5–15)
BUN: 31 mg/dL — ABNORMAL HIGH (ref 8–23)
CO2: 24 mmol/L (ref 22–32)
Calcium: 8.4 mg/dL — ABNORMAL LOW (ref 8.9–10.3)
Chloride: 102 mmol/L (ref 98–111)
Creatinine, Ser: 0.99 mg/dL (ref 0.44–1.00)
GFR calc Af Amer: >60 mL/min (ref >60)
GFR calc non Af Amer: 59 mL/min — ABNORMAL LOW (ref >60)
Glucose, Bld: 148 mg/dL — ABNORMAL HIGH (ref 70–99)
Potassium: 3.8 mmol/L (ref 3.5–5.1)
Sodium: 137 mmol/L (ref 135–145)

## 2018-10-24 LAB — LACTATE DEHYDROGENASE: LDH: 518 U/L — ABNORMAL HIGH (ref 98–192)

## 2018-10-24 LAB — URINALYSIS, ROUTINE W REFLEX MICROSCOPIC
Bilirubin Urine: NEGATIVE
Glucose, UA: NEGATIVE mg/dL
Hgb urine dipstick: NEGATIVE
Ketones, ur: NEGATIVE mg/dL
Leukocytes,Ua: NEGATIVE
Nitrite: NEGATIVE
Protein, ur: NEGATIVE mg/dL
Specific Gravity, Urine: 1.025 (ref 1.005–1.030)
pH: 5 (ref 5.0–8.0)

## 2018-10-24 LAB — BRAIN NATRIURETIC PEPTIDE: B Natriuretic Peptide: 234.5 pg/mL — ABNORMAL HIGH (ref 0.0–100.0)

## 2018-10-24 LAB — C-REACTIVE PROTEIN: CRP: 1.6 mg/dL — ABNORMAL HIGH (ref <1.0)

## 2018-10-24 LAB — TSH: TSH: 2.28 u[IU]/mL (ref 0.350–4.500)

## 2018-10-24 LAB — D-DIMER, QUANTITATIVE: D-Dimer, Quant: 5.83 ug/mL-FEU — ABNORMAL HIGH (ref 0.00–0.50)

## 2018-10-24 MED ORDER — LACTATED RINGERS IV SOLN
INTRAVENOUS | Status: AC
  Administered 2018-10-24: 12:00:00 via INTRAVENOUS

## 2018-10-24 MED ORDER — ALPRAZOLAM 0.5 MG PO TABS
0.2500 mg | ORAL_TABLET | Freq: Two times a day (BID) | ORAL | Status: DC | PRN
  Administered 2018-10-24 – 2018-11-11 (×4): 0.25 mg via ORAL
  Filled 2018-10-24 (×5): qty 1

## 2018-10-24 NOTE — Progress Notes (Signed)
Pt continues to be anxious with frequent requests for assistance from staff. Pt also requests staff to remain in the room with her. Verbalized that she would like her door open as she does not like being in the room alone. Pt and daughter (who has remained on phone for entire shift) educated on the rationale for keeping doors closed at all times. Pt and daughter verbalize understanding. Emotional support provided to patient and family.

## 2018-10-24 NOTE — Progress Notes (Signed)
Spoke with pt primary contact Verdis Frederickson and updated on plan of care as well as answered all questions and concerns.

## 2018-10-24 NOTE — Plan of Care (Signed)

## 2018-10-24 NOTE — Progress Notes (Signed)
Pt O2 sats 74 -78 when on bedside commode on 15L O2 HFNC. Assisted pt to chair and placed nonrebreather on pt on 15L and pt sats able to maintain at 85 or above. Placed pt back on HFNC and sats currently maintaining. Will continue to monitor.

## 2018-10-24 NOTE — Progress Notes (Signed)
Physical Therapy Treatment Patient Details Name: Christina Clark MRN: [id number] DOB: Nov 05, 1950 Today's Date: 10/24/2018    History of Present Illness 68 year old Spanish-speaking female with past medical history of morbid obesity, hypertension and depression  presenting to the emergency room 10/10/18 with 1 week of fever, cough and shortness of breath in the setting of known COVID-19 contacts.  Patient found to be COVID positive as well as hypoxic with bilateral infiltrates on chest x-ray.  Requiring oxygen support of high flow    PT Comments    The patient is on 15 L HFNC with SaO2 at 88% at rest. Patient sat forward in recliner and SaO2 dropped to 77. Required several minutes  For SaO2 to return to *5%. Patient stood x 1 with RW for less than 1 minute with Sao2 dropping to 75%, patient  Then sat down. Patient daughter on the phone for interpreting. Pulse ox sensor on patient's forehead. Continue PT. Unsure what respiratory support that can be provided for patient during activity  To maintain O2 saturation. Continue PT  Follow Up Recommendations  CIR     Equipment Recommendations  Rolling walker with 5" wheels    Recommendations for Other Services Rehab consult     Precautions / Restrictions Precautions Precaution Comments: monitor sats , VS on HFNC at 8-15 L sats drop into 70s    Mobility  Bed Mobility               General bed mobility comments: OOB in chair  Transfers   Equipment used: Rolling walker (2 wheeled) Transfers: Sit to/from Stand Sit to Stand: Min assist         General transfer comment: Min A for stbility, kkeps eyes closed and  really appears to struggle with SOB, appears to be attempting PLB  Ambulation/Gait             General Gait Details: unable due to DOE   [employer]    Modified Rankin (Stroke Patients Only)       Balance     Sitting balance-Leahy Scale: Good       Standing  balance-Leahy Scale: Fair                              Cognition Arousal/Alertness: Awake/alert Behavior During Therapy: WFL for tasks assessed/performed                                   General Comments: daughter on phone interpreting, patiet appears WFL, followed all directions      Exercises      General Comments        Pertinent Vitals/Pain Pain Assessment: No/denies pain    Home Living                      Prior Function            PT Goals (current goals can now be found in the care plan section) Progress towards PT goals: Not progressing toward goals - comment(patient desats with minimal activity)    Frequency    Min 3X/week      PT Plan Current plan remains appropriate    Co-evaluation              AM-PAC PT "6 Clicks" Mobility  Outcome Measure  Help needed turning from your back to your side while in a flat bed without using bedrails?: A Lot Help needed moving from lying on your back to sitting on the side of a flat bed without using bedrails?: A Lot Help needed moving to and from a bed to a chair (including a wheelchair)?: A Lot Help needed standing up from a chair using your arms (e.g., wheelchair or bedside chair)?: A Lot Help needed to walk in hospital room?: A Lot Help needed climbing 3-5 steps with a railing? : A Lot 6 Click Score: 12    End of Session Equipment Utilized During Treatment: Oxygen Activity Tolerance: Treatment limited secondary to medical complications (Comment) Patient left: in chair;with call bell/phone within reach;with family/visitor present(family on phone) Nurse Communication: Mobility status PT Visit Diagnosis: Difficulty in walking, not elsewhere classified (R26.2)     Time: [id number] PT Time Calculation (min) (ACUTE ONLY): 20 min  Charges:  $Therapeutic Activity: 23-37 mins                     Blanchard KelchKaren [name] PT Acute Rehabilitation Services Pager [phone] Office  [phone]    Rada HayHill, [name] [name] 10/24/2018, 5:20 PM

## 2018-10-24 NOTE — Progress Notes (Signed)
Spoke with primary RN re: PIV, she will asked night shift RN to try and if not obtained will notify IV Team/placed another PIV consult.

## 2018-10-24 NOTE — Progress Notes (Signed)
PROGRESS NOTE                                                                                                                                                                                                             Patient Demographics:    Christina Clark, is a 68 y.o. female, DOB - 27-Mar-1951, CMK:[id number]  Outpatient Primary MD for the patient is Patient, No Pcp Per    LOS - 14  Admit date - 10/10/2018    No chief complaint on file.      Brief Narrative  68 year old Spanish-speaking female with past medical history of morbid obesity, hypertension and depression admitted to the hospitalist service on [place] for COVID pneumonia related acute hypoxic respiratory failure. She was started on Remdisivir, steroids and given Actemra x1.   Subjective:    [name] today has, No headache, No chest pain, No abdominal pain - No Nausea, No new weakness tingling or numbness, improved Cough - SOB.     Assessment  & Plan :     1. Acute Hypoxic Resp. Failure due to Acute Covid 19 Viral Pneumonitis during the ongoing 2020 Covid 19 Pandemic - she has already received and has completed her Remdisivir, steroids and Actemra x1, clinically has improved currently between high flow nasal cannula oxygen and nonrebreather mask.  Not in any respiratory distress.  Continue to advance activity, added flutter valve and I-S for pulmonary toiletry encouraged to sit up in chair and daytime.  Continue to titrate down oxygen and monitor clinically.  Inflammatory markers have stabilized.     ABG     Component Value Date/Time   PHART 7.52 (H) 10/10/2018 1647   PCO2ART 34 10/10/2018 1647   PO2ART 53 (L) 10/10/2018 1647   HCO3 27.8 10/10/2018 1647   O2SAT 90.6 10/10/2018 1647    COVID-19 Labs  Recent Labs    10/22/18 0115 10/23/18 0030 10/24/18 0323  DDIMER 3.55* 5.81* 5.83*  FERRITIN 794* 394* 262  LDH 415*  473* 518*  CRP 1.0* <0.8 1.6*    Lab Results  Component Value Date   SARSCOV2NAA POSITIVE (A) 10/10/2018     Hepatic Function Latest Ref Rng & Units 10/22/2018 10/21/2018 10/20/2018  Total Protein 6.5 - 8.1 g/dL 6.9 6.3(L) 6.8  Albumin 3.5 - 5.0 g/dL 3.0(L) 2.8(L) 2.9(L)  AST  15 - 41 U/L _0 ALT 0 - 44 U/L 47(H) 52(H) 60(H)  Alk Phosphatase 38 - 126 U/L 47 40 40  Total Bilirubin 0.3 - 1.2 mg/dL 0.8 0.6 0.7     No results found for: BNP    2.  AKI.  Resolved after hydration.  3.  Essential hypertension.  Blood pressure stable not on any medications currently.  4.  DM depression.  On home dose Zoloft along with low-dose Xanax and stable.  5.  Morbid obesity BMI 41.  Follow with PCP for weight loss and diet control.   6.  Mild sinus tachycardia with hypotension.  Gentle hydration for 5 hours on 10/24/2018, check TSH and BNP and monitor closely.    7. DM type II in poor outpatient control due to hyperglycemia.  A1c 8.5.  She was not on any medications at home.  Currently on insulin.  CBG stable.  Will give her DM and insulin education.  CBG (last 3)  Recent Labs    10/23/18 1637 10/23/18 1958 10/24/18 0728  GLUCAP 191* 142* 150*     Condition - Extremely Guarded  Family Communication  :  daughter over the phone 10/24/18  Code Status :  Full  Diet :   Diet Order            DIET DYS 3 Room service appropriate? Yes; Fluid consistency: Thin  Diet effective now               Disposition Plan  :  PCU  Consults  :  None  Procedures  :  None  PUD Prophylaxis :   DVT Prophylaxis  :  Lovenox   Lab Results  Component Value Date   PLT 300 10/24/2018    Inpatient Medications  Scheduled Meds: . sodium chloride   Intravenous Once  . ALPRAZolam  0.25 mg Oral Daily  . Chlorhexidine Gluconate Cloth  6 each Topical Q0600  . enoxaparin (LOVENOX) injection  0.5 mg/kg Subcutaneous Q12H  . insulin aspart  0-20 Units Subcutaneous TID WC  . insulin aspart  0-5  Units Subcutaneous QHS  . insulin aspart  4 Units Subcutaneous TID WC  . insulin detemir  25 Units Subcutaneous QHS  . sertraline  50 mg Oral Daily  . sodium chloride flush  3 mL Intravenous Q12H   Continuous Infusions: . sodium chloride Stopped (10/12/18 1042)   PRN Meds:.sodium chloride, acetaminophen, ALPRAZolam, ondansetron **OR** ondansetron (ZOFRAN) IV, polyethylene glycol, sodium chloride, sodium chloride flush  Antibiotics  :    Anti-infectives (From admission, onward)   Start     Dose/Rate Route Frequency Ordered Stop   10/12/18 0900  remdesivir 100 mg in sodium chloride 0.9 % 230 mL IVPB     100 mg 500 mL/hr over 30 Minutes Intravenous Every 24 hours 10/11/18 0745 10/15/18 0928   10/11/18 0900  remdesivir 200 mg in sodium chloride 0.9 % 210 mL IVPB     200 mg 500 mL/hr over 30 Minutes Intravenous Once 10/11/18 0745 10/11/18 1617       Time Spent in minutes  30   Lala Lund M.D on 10/24/2018 at 12:05 PM  To page go to [email or web address] - password [facility]  [facility] -  Office  [phone]     See all Orders from today for further details    Objective:   Vitals:   10/23/18 2320 10/24/18 0455 10/24/18 0730 10/24/18 1202  BP: (!) 112/55 (!) 117/54 (!) 118/59 (!) 115/55  Pulse: (!) 110 91 (!) 105 (!) 106  Resp: (!) 29 (!) 30 (!) 35 (!) 35  Temp: 98.2 F (36.8 C) 98.7 F (37.1 C) 98.9 F (37.2 C) 98.1 F (36.7 C)  TempSrc: Oral Oral Oral Oral  SpO2: 91% 97% 90% 92%  Weight:      Height:        Wt Readings from Last 3 Encounters:  10/10/18 108.7 kg  10/10/18 109 kg    No intake or output data in the 24 hours ending 10/24/18 1205   Physical Exam  Awake Alert,  No new F.N deficits, Normal affect Arthur.AT,PERRAL Supple Neck,No JVD, No cervical lymphadenopathy appriciated.  Symmetrical Chest wall movement, Good air movement bilaterally, few rales RRR,No Gallops,Rubs or new Murmurs, No Parasternal Heave +ve B.Sounds, Abd Soft, No tenderness, No  organomegaly appriciated, No rebound - guarding or rigidity. No Cyanosis, Clubbing or edema, No new Rash or bruise       Data Review:    CBC Recent Labs  Lab 10/19/18 0549 10/20/18 0050 10/21/18 0115 10/22/18 0115 10/24/18 0323  WBC 14.0* 12.9* 13.1* 13.8* 25.3*  HGB 13.5 14.6 13.0 13.3 13.0  HCT 41.9 43.2 40.3 40.4 39.7  PLT 403* 431* 330 331 300  MCV 93.1 92.9 94.4 93.5 95.9  MCH 30.0 31.4 30.4 30.8 31.4  MCHC 32.2 33.8 32.3 32.9 32.7  RDW 14.6 14.7 14.6 14.6 15.8*    Chemistries  Recent Labs  Lab 10/18/18 0630 10/19/18 0549 10/20/18 0050 10/21/18 0115 10/22/18 0115 10/23/18 0030 10/24/18 0323  NA 134* 136 136 138 136 137 137  K 3.9 3.6 3.5 3.5 3.2* 3.9 3.8  CL 97* 102 100 101 100 101 102  CO2 18* _0 GLUCOSE 152* 119* 192* 109* 149* 151* 148*  BUN 77* 60* 56* 41* 39* 33* 31*  CREATININE 1.73* 1.19* 1.15* 0.98 1.00 0.91 0.99  CALCIUM 9.1 8.3* 8.5* 8.5* 8.5* 8.7* 8.4*  AST 33 36 _1 --   --   ALT 62* 59* 60* 52* 47*  --   --   ALKPHOS 42 36* 40 40 47  --   --   BILITOT 0.7 0.9 0.7 0.6 0.8  --   --    ------------------------------------------------------------------------------------------------------------------ No results for input(s): CHOL, HDL, LDLCALC, TRIG, CHOLHDL, LDLDIRECT in the last 72 hours.  Lab Results  Component Value Date   HGBA1C 8.1 (H) 10/11/2018   ------------------------------------------------------------------------------------------------------------------ No results for input(s): TSH, T4TOTAL, T3FREE, THYROIDAB in the last 72 hours.  Invalid input(s): FREET3  Cardiac Enzymes No results for input(s): CKMB, TROPONINI, MYOGLOBIN in the last 168 hours.  Invalid input(s): CK ------------------------------------------------------------------------------------------------------------------ No results found for: BNP  Micro Results Recent Results (from the past 240 hour(s))  MRSA PCR Screening     Status: None    Collection Time: 10/16/18  4:40 PM   Specimen: Nasal Mucosa; Nasopharyngeal  Result Value Ref Range Status   MRSA by PCR NEGATIVE NEGATIVE Final    Comment:        The GeneXpert MRSA Assay (FDA approved for NASAL specimens only), is one component of a comprehensive MRSA colonization surveillance program. It is not intended to diagnose MRSA infection nor to guide or monitor treatment for MRSA infections. Performed at [facility], [place] [address] [place], [place] [address]     Radiology Reports Dg Chest Port 1 View  Result Date: 10/24/2018 CLINICAL DATA:  Order for SOB  Hx of HTN EXAM:  PORTABLE CHEST - 1 VIEW COMPARISON:  10/22/2018 FINDINGS: Progression of bilateral diffuse coarse airspace opacities with relative sparing of the apices. Heart size and mediastinal contours are within normal limits. Aortic Atherosclerosis (ICD10-170.0). No effusion. Visualized bones unremarkable. IMPRESSION: Worsening bilateral infiltrates or edema. Electronically Signed   By: Lucrezia Europe M.D.   On: 10/24/2018 08:55   Dg Chest Port 1 View  Result Date: 10/22/2018 CLINICAL DATA:  Pneumonia. EXAM: PORTABLE CHEST 1 VIEW COMPARISON:  Radiograph of October 10, 2018. FINDINGS: The heart size and mediastinal contours are within normal limits. No pneumothorax or pleural effusion is noted. Slightly improved bilateral lung opacities are noted consistent with multifocal pneumonia. The visualized skeletal structures are unremarkable. IMPRESSION: Slightly improved multifocal pneumonia. Electronically Signed   By: Marijo Conception M.D.   On: 10/22/2018 11:32   Dg Chest Port 1 View  Result Date: 10/10/2018 CLINICAL DATA:  Fever and shortness of breath EXAM: PORTABLE CHEST 1 VIEW COMPARISON:  None. FINDINGS: There are bilateral streaky airspace opacities, worst in the right upper lobe. Cardiomediastinal contours are normal. No pneumothorax or sizable pleural effusion. IMPRESSION: Bilateral streaky airspace  opacities which may indicate multifocal infection. Electronically Signed   By: Ulyses Jarred M.D.   On: 10/10/2018 17:30

## 2018-10-24 NOTE — Progress Notes (Signed)
Inpatient Diabetes Program Recommendations  AACE/ADA: New Consensus Statement on Inpatient Glycemic Control (2015)  Target Ranges:  Prepandial:   less than 140 mg/dL      Peak postprandial:   less than 180 mg/dL (1-2 hours)      Critically ill patients:  140 - 180 mg/dL   Lab Results  Component Value Date   GLUCAP 283 (H) 10/24/2018   HGBA1C 8.1 (H) 10/11/2018    Review of Glycemic Control Results for Christina Clark, Christina Clark (MRN [id number]) as of 10/24/2018 15:42  Ref. Range 10/23/2018 11:11 10/23/2018 16:37 10/23/2018 19:58 10/24/2018 07:28 10/24/2018 12:01  Glucose-Capillary Latest Ref Range: 70 - 99 mg/dL 251 (H) 191 (H) 142 (H) 150 (H) 283 (H)   Diabetes history: DM 2 Outpatient Diabetes medications:  None Current orders for Inpatient glycemic control:  Novolog resistant tid with meals and HS Novolog 4 units tid with meals Levemir 25 units q HS  Inpatient Diabetes Program Recommendations:    Referral received. Diabetes Coordinator has spoken to patient using [facility] regarding new diagnosis.  Currently requiring insulin however A1C indicates that patient may be able to transition to oral agent such as Metformin at discharge.  Will follow.   Thanks  Adah Perl, RN, BC-ADM Inpatient Diabetes Coordinator Pager [phone] (8a-5p)

## 2018-10-25 LAB — BRAIN NATRIURETIC PEPTIDE: B Natriuretic Peptide: 168.9 pg/mL — ABNORMAL HIGH (ref 0.0–100.0)

## 2018-10-25 LAB — GLUCOSE, CAPILLARY
Glucose-Capillary: 123 mg/dL — ABNORMAL HIGH (ref 70–99)
Glucose-Capillary: 270 mg/dL — ABNORMAL HIGH (ref 70–99)
Glucose-Capillary: 119 mg/dL — ABNORMAL HIGH (ref 70–99)
Glucose-Capillary: 141 mg/dL — ABNORMAL HIGH (ref 70–99)

## 2018-10-25 LAB — COMPREHENSIVE METABOLIC PANEL
ALT: 32 U/L (ref 0–44)
AST: 15 U/L (ref 15–41)
Albumin: 3 g/dL — ABNORMAL LOW (ref 3.5–5.0)
Alkaline Phosphatase: 87 U/L (ref 38–126)
Anion gap: 10 (ref 5–15)
BUN: 27 mg/dL — ABNORMAL HIGH (ref 8–23)
CO2: 27 mmol/L (ref 22–32)
Calcium: 8.7 mg/dL — ABNORMAL LOW (ref 8.9–10.3)
Chloride: 102 mmol/L (ref 98–111)
Creatinine, Ser: 0.86 mg/dL (ref 0.44–1.00)
GFR calc Af Amer: >60 mL/min (ref >60)
GFR calc non Af Amer: >60 mL/min (ref >60)
Glucose, Bld: 96 mg/dL (ref 70–99)
Potassium: 3.8 mmol/L (ref 3.5–5.1)
Sodium: 139 mmol/L (ref 135–145)
Total Bilirubin: 0.3 mg/dL (ref 0.3–1.2)
Total Protein: 6.6 g/dL (ref 6.5–8.1)

## 2018-10-25 LAB — CBC WITH DIFFERENTIAL/PLATELET
Abs Immature Granulocytes: 0.16 10*3/uL — ABNORMAL HIGH (ref 0.00–0.07)
Basophils Absolute: 0.1 10*3/uL (ref 0.0–0.1)
Basophils Relative: 0 %
Eosinophils Absolute: 0.4 10*3/uL (ref 0.0–0.5)
Eosinophils Relative: 2 %
HCT: 41.9 % (ref 36.0–46.0)
Hemoglobin: 13.5 g/dL (ref 12.0–15.0)
Immature Granulocytes: 1 %
Lymphocytes Relative: 6 %
Lymphs Abs: 1.4 10*3/uL (ref 0.7–4.0)
MCH: 31.1 pg (ref 26.0–34.0)
MCHC: 32.2 g/dL (ref 30.0–36.0)
MCV: 96.5 fL (ref 80.0–100.0)
Monocytes Absolute: 0.9 10*3/uL (ref 0.1–1.0)
Monocytes Relative: 4 %
Neutro Abs: 20 10*3/uL — ABNORMAL HIGH (ref 1.7–7.7)
Neutrophils Relative %: 87 %
Platelets: 276 10*3/uL (ref 150–400)
RBC: 4.34 MIL/uL (ref 3.87–5.11)
RDW: 16.2 % — ABNORMAL HIGH (ref 11.5–15.5)
WBC: 22.9 10*3/uL — ABNORMAL HIGH (ref 4.0–10.5)
nRBC: 0 % (ref 0.0–0.2)

## 2018-10-25 LAB — FERRITIN: Ferritin: 212 ng/mL (ref 11–307)

## 2018-10-25 LAB — URINE CULTURE: Culture: <10000 — AB

## 2018-10-25 LAB — LACTATE DEHYDROGENASE: LDH: 544 U/L — ABNORMAL HIGH (ref 98–192)

## 2018-10-25 LAB — MAGNESIUM: Magnesium: 2.7 mg/dL — ABNORMAL HIGH (ref 1.7–2.4)

## 2018-10-25 LAB — D-DIMER, QUANTITATIVE: D-Dimer, Quant: 3.99 ug/mL-FEU — ABNORMAL HIGH (ref 0.00–0.50)

## 2018-10-25 LAB — C-REACTIVE PROTEIN: CRP: 2.8 mg/dL — ABNORMAL HIGH (ref <1.0)

## 2018-10-25 MED ORDER — FUROSEMIDE 10 MG/ML IJ SOLN
60.0000 mg | Freq: Once | INTRAMUSCULAR | Status: AC
  Administered 2018-10-25: 60 mg via INTRAVENOUS
  Filled 2018-10-25: qty 6

## 2018-10-25 MED ORDER — FUROSEMIDE 10 MG/ML IJ SOLN: 40.0000 mg | Freq: Once | INTRAMUSCULAR | Status: DC

## 2018-10-25 MED ORDER — ALBUTEROL SULFATE HFA 108 (90 BASE) MCG/ACT IN AERS
2.0000 | INHALATION_SPRAY | Freq: Four times a day (QID) | RESPIRATORY_TRACT | Status: DC | PRN
  Administered 2018-10-25 – 2018-10-29 (×5): 2 via RESPIRATORY_TRACT
  Filled 2018-10-25: qty 6.7

## 2018-10-25 MED ORDER — FUROSEMIDE 20 MG PO TABS: 60.0000 mg | ORAL_TABLET | Freq: Once | ORAL | Status: DC

## 2018-10-25 MED ORDER — FUROSEMIDE 20 MG PO TABS
40.0000 mg | ORAL_TABLET | Freq: Once | ORAL | Status: AC
  Administered 2018-10-25: 40 mg via ORAL
  Filled 2018-10-25: qty 2

## 2018-10-25 MED ORDER — SODIUM CHLORIDE 0.9% FLUSH
10.0000 mL | Freq: Two times a day (BID) | INTRAVENOUS | Status: DC
  Administered 2018-10-25: 22:00:00 15 mL
  Administered 2018-10-26 – 2018-11-18 (×45): 10 mL

## 2018-10-25 MED ORDER — SODIUM CHLORIDE 0.9% FLUSH: 10.0000 mL | INTRAVENOUS | Status: DC | PRN

## 2018-10-25 MED ORDER — NITROGLYCERIN 2 % TD OINT
0.5000 [in_us] | TOPICAL_OINTMENT | Freq: Four times a day (QID) | TRANSDERMAL | Status: DC
  Administered 2018-10-25 – 2018-10-29 (×10): 0.5 [in_us] via TOPICAL
  Filled 2018-10-25: qty 30

## 2018-10-25 NOTE — Progress Notes (Signed)
RT called to patient's room due to desat.  Patient in prone position with NRB mask at 15 L, sats 82%.  Pulse oximetry does not have a good pleth.   Patient states her breathing is ok to her daughter Christina Clark.  Patient turned to supine position and a new pulse ox was placed on her ear. Patient's stats improved to 92%.

## 2018-10-25 NOTE — Progress Notes (Addendum)
PROGRESS NOTE                                                                                                                                                                                                             Patient Demographics:    Christina Clark, is a 68 y.o. female, DOB - October 20, 1950, ZFP:[id number]  Outpatient Primary MD for the patient is Patient, No Pcp Per    LOS - 15  Admit date - 10/10/2018    No chief complaint on file.      Brief Narrative  68 year old Spanish-speaking female with past medical history of morbid obesity, hypertension and depression admitted to the hospitalist service on [place] for COVID pneumonia related acute hypoxic respiratory failure. She was started on Remdisivir, steroids and given Actemra x1.   Subjective:   Patient in bed, appears comfortable, denies any headache, no fever, no chest pain or pressure, no shortness of breath , no abdominal pain. No focal weakness.   Assessment  & Plan :     1. Acute Hypoxic Resp. Failure due to Acute Covid 19 Viral Pneumonitis during the ongoing 2020 Covid 19 Pandemic - she has already received and has completed her cynical course of Remdisivir, steroids, Plasma and Actemra, only mild improvement so far still tenuous and requiring 15 to 20 L high flow nasal cannula oxygen although she does not appear to be in any acute respiratory distress at rest sitting in chair.  Continue to advance activity, added flutter valve and I-S for pulmonary toiletry encouraged to sit up in chair and daytime.  Continue to titrate down oxygen and monitor clinically.  Inflammatory markers have stabilized.   ABG     Component Value Date/Time   PHART 7.52 (H) 10/10/2018 1647   PCO2ART 34 10/10/2018 1647   PO2ART 53 (L) 10/10/2018 1647   HCO3 27.8 10/10/2018 1647   O2SAT 90.6 10/10/2018 1647    COVID-19 Labs  Recent Labs    10/23/18 0030  10/24/18 0323 10/25/18 0323 10/25/18 0328  DDIMER 5.81* 5.83*  --  3.99*  FERRITIN 394* 262 212  --   LDH 473* 518*  --  544*  CRP <0.8 1.6* 2.8*  --     Lab Results  Component Value Date   SARSCOV2NAA POSITIVE (A) 10/10/2018     Hepatic Function Latest  Ref Rng & Units 10/25/2018 10/22/2018 10/21/2018  Total Protein 6.5 - 8.1 g/dL 6.6 6.9 6.3(L)  Albumin 3.5 - 5.0 g/dL 3.0(L) 3.0(L) 2.8(L)  AST 15 - 41 U/L '15 18 25  '$ ALT 0 - 44 U/L 32 47(H) 52(H)  Alk Phosphatase 38 - 126 U/L 87 47 40  Total Bilirubin 0.3 - 1.2 mg/dL 0.3 0.8 0.6        Component Value Date/Time   BNP 168.9 (H) 10/25/2018 0323     2.  AKI.  Resolved after hydration.  3.  Essential hypertension.  Blood pressure stable not on any medications currently.  4.  DM depression.  On home dose Zoloft along with low-dose Xanax and stable.  5.  Morbid obesity BMI 41.  Follow with PCP for weight loss and diet control.   6.  Mild sinus tachycardia with hypotension.  Stable TSH resolved after gentle hydration.    Lab Results  Component Value Date   TSH 2.280 10/24/2018    7. DM type II in poor outpatient control due to hyperglycemia.  A1c 8.5.  She was not on any medications at home.  Currently on insulin.  CBG stable.  Will give her DM and insulin education.  CBG (last 3)  Recent Labs    10/24/18 1724 10/24/18 2134 10/25/18 0734  GLUCAP 144* 176* 123*    Condition - Extremely Guarded  Family Communication  :  daughter over the phone 10/24/18, 10/25/2018  Code Status :  Full  Diet :   Diet Order            DIET DYS 3 Room service appropriate? Yes; Fluid consistency: Thin  Diet effective now               Disposition Plan  :  PCU  Consults  :  None  Procedures  :  None  PUD Prophylaxis :   DVT Prophylaxis  :  Lovenox   Lab Results  Component Value Date   PLT 276 10/25/2018    Inpatient Medications  Scheduled Meds: . sodium chloride   Intravenous Once  . ALPRAZolam  0.25 mg Oral  Daily  . enoxaparin (LOVENOX) injection  0.5 mg/kg Subcutaneous Q12H  . furosemide  40 mg Intravenous Once  . insulin aspart  0-20 Units Subcutaneous TID WC  . insulin aspart  0-5 Units Subcutaneous QHS  . insulin aspart  4 Units Subcutaneous TID WC  . insulin detemir  25 Units Subcutaneous QHS  . sertraline  50 mg Oral Daily   Continuous Infusions:  PRN Meds:.acetaminophen, ALPRAZolam, [DISCONTINUED] ondansetron **OR** ondansetron (ZOFRAN) IV, polyethylene glycol, sodium chloride  Antibiotics  :    Anti-infectives (From admission, onward)   Start     Dose/Rate Route Frequency Ordered Stop   10/12/18 0900  remdesivir 100 mg in sodium chloride 0.9 % 230 mL IVPB     100 mg 500 mL/hr over 30 Minutes Intravenous Every 24 hours 10/11/18 0745 10/15/18 0928   10/11/18 0900  remdesivir 200 mg in sodium chloride 0.9 % 210 mL IVPB     200 mg 500 mL/hr over 30 Minutes Intravenous Once 10/11/18 0745 10/11/18 1617       Time Spent in minutes  30   Lala Lund M.D on 10/25/2018 at 10:09 AM  To page go to [email or web address] - password [email or web address]  [facility] -  Office  [phone]   See all Orders from today for further details    Objective:   Vitals:  10/25/18 0426 10/25/18 0736 10/25/18 0818 10/25/18 0900  BP: (!) 119/46 (!) 118/46    Pulse: 99 98 97 96  Resp: (!) 30 (!) 33 16 (!) 23  Temp: 98.7 F (37.1 C) 97.8 F (36.6 C)    TempSrc: Oral Oral    SpO2: (!) 89% (!) 87% (!) 87% (!) 85%  Weight:      Height:        Wt Readings from Last 3 Encounters:  10/10/18 108.7 kg  10/10/18 109 kg     Intake/Output Summary (Last 24 hours) at 10/25/2018 1009 Last data filed at 10/25/2018 0900 Gross per 24 hour  Intake 706.05 ml  Output -  Net 706.05 ml     Physical Exam  Awake Alert,  No new F.N deficits, Normal affect [place].AT,PERRAL Supple Neck,No JVD, No cervical lymphadenopathy appriciated.  Symmetrical Chest wall movement, Good air movement bilaterally, few rales  RRR,No Gallops,Rubs or new Murmurs, No Parasternal Heave +ve B.Sounds, Abd Soft, No tenderness, No organomegaly appriciated, No rebound - guarding or rigidity. No Cyanosis, Clubbing or edema, No new Rash or bruise       Data Review:    CBC Recent Labs  Lab 10/20/18 0050 10/21/18 0115 10/22/18 0115 10/24/18 0323 10/25/18 0328  WBC 12.9* 13.1* 13.8* 25.3* 22.9*  HGB 14.6 13.0 13.3 13.0 13.5  HCT 43.2 40.3 40.4 39.7 41.9  PLT 431* 330 331 300 276  MCV 92.9 94.4 93.5 95.9 96.5  MCH 31.4 30.4 30.8 31.4 31.1  MCHC 33.8 32.3 32.9 32.7 32.2  RDW 14.7 14.6 14.6 15.8* 16.2*  LYMPHSABS  --   --   --   --  1.4  MONOABS  --   --   --   --  0.9  EOSABS  --   --   --   --  0.4  BASOSABS  --   --   --   --  0.1    Chemistries  Recent Labs  Lab 10/19/18 0549 10/20/18 0050 10/21/18 0115 10/22/18 0115 10/23/18 0030 10/24/18 0323 10/25/18 0328  NA 136 136 138 136 137 137 139  K 3.6 3.5 3.5 3.2* 3.9 3.8 3.8  CL 102 100 101 100 101 102 102  CO2 '23 28 27 24 24 24 27  '$ GLUCOSE 119* 192* 109* 149* 151* 148* 96  BUN 60* 56* 41* 39* 33* 31* 27*  CREATININE 1.19* 1.15* 0.98 1.00 0.91 0.99 0.86  CALCIUM 8.3* 8.5* 8.5* 8.5* 8.7* 8.4* 8.7*  MG  --   --   --   --   --   --  2.7*  AST 36 '28 25 18  '$ --   --  15  ALT 59* 60* 52* 47*  --   --  32  ALKPHOS 36* 40 40 47  --   --  87  BILITOT 0.9 0.7 0.6 0.8  --   --  0.3   ------------------------------------------------------------------------------------------------------------------ No results for input(s): CHOL, HDL, LDLCALC, TRIG, CHOLHDL, LDLDIRECT in the last 72 hours.  Lab Results  Component Value Date   HGBA1C 8.1 (H) 10/11/2018   ------------------------------------------------------------------------------------------------------------------ Recent Labs    10/24/18 0323  TSH 2.280    Cardiac Enzymes No results for input(s): CKMB, TROPONINI, MYOGLOBIN in the last 168 hours.  Invalid input(s): CK  ------------------------------------------------------------------------------------------------------------------    Component Value Date/Time   BNP 168.9 (H) 10/25/2018 7654    Micro Results Recent Results (from the past 240 hour(s))  MRSA PCR Screening  Status: None   Collection Time: 10/16/18  4:40 PM   Specimen: Nasal Mucosa; Nasopharyngeal  Result Value Ref Range Status   MRSA by PCR NEGATIVE NEGATIVE Final    Comment:        The GeneXpert MRSA Assay (FDA approved for NASAL specimens only), is one component of a comprehensive MRSA colonization surveillance program. It is not intended to diagnose MRSA infection nor to guide or monitor treatment for MRSA infections. Performed at [facility], [place] [address], [place], [place] [address]   Culture, Urine     Status: Abnormal   Collection Time: 10/24/18  7:21 AM   Specimen: Urine, Clean Catch  Result Value Ref Range Status   Specimen Description   Final    URINE, CLEAN CATCH Performed at [facility], [place] [address] [place], [place] [address]    Special Requests   Final    NONE Performed at [facility], [place] [address], [place], [place] [address]    Culture (A)  Final    <10,000 COLONIES/mL INSIGNIFICANT GROWTH Performed at [facility] [address], [place], [place] [address]    Report Status 10/25/2018 FINAL  Final    Radiology Reports Dg Chest Port 1 View  Result Date: 10/24/2018 CLINICAL DATA:  Order for SOB  Hx of HTN EXAM: PORTABLE CHEST - 1 VIEW COMPARISON:  10/22/2018 FINDINGS: Progression of bilateral diffuse coarse airspace opacities with relative sparing of the apices. Heart size and mediastinal contours are within normal limits. Aortic Atherosclerosis (ICD10-170.0). No effusion. Visualized bones unremarkable. IMPRESSION: Worsening bilateral infiltrates or edema. Electronically Signed   By: Lucrezia Europe M.D.   On: 10/24/2018 08:55   Dg  Chest Port 1 View  Result Date: 10/22/2018 CLINICAL DATA:  Pneumonia. EXAM: PORTABLE CHEST 1 VIEW COMPARISON:  Radiograph of October 10, 2018. FINDINGS: The heart size and mediastinal contours are within normal limits. No pneumothorax or pleural effusion is noted. Slightly improved bilateral lung opacities are noted consistent with multifocal pneumonia. The visualized skeletal structures are unremarkable. IMPRESSION: Slightly improved multifocal pneumonia. Electronically Signed   By: Marijo Conception M.D.   On: 10/22/2018 11:32   Dg Chest Port 1 View  Result Date: 10/10/2018 CLINICAL DATA:  Fever and shortness of breath EXAM: PORTABLE CHEST 1 VIEW COMPARISON:  None. FINDINGS: There are bilateral streaky airspace opacities, worst in the right upper lobe. Cardiomediastinal contours are normal. No pneumothorax or sizable pleural effusion. IMPRESSION: Bilateral streaky airspace opacities which may indicate multifocal infection. Electronically Signed   By: Ulyses Jarred M.D.   On: 10/10/2018 17:30

## 2018-10-25 NOTE — Progress Notes (Signed)
Pt sitting in chair, aaox4, oxygen level 80-90 oln 15l hfnc. Pt facetiming with daughter for support. Pt does have anxiety and doesn't like to be alone. Will cont to make as much contact with patient as possible during this 12 hr shift. Pt eating well, able to feed self.

## 2018-10-25 NOTE — Progress Notes (Signed)
Occupational Therapy Treatment Patient Details Name: Christina Clark MRN: [id number] DOB: 12/15/1950 Today's Date: 10/25/2018    History of present illness 68 year old Spanish-speaking female with past medical history of morbid obesity, hypertension and depression  presenting to the emergency room 10/10/18 with 1 week of fever, cough and shortness of breath in the setting of known COVID-19 contacts.  Patient found to be COVID positive as well as hypoxic with bilateral infiltrates on chest x-ray.  Requiring oxygen support of high flow   OT comments  Pt fatigued this pm. SpO2 82 with good pleth while sitting. Pt stated she was having "a little difficulty" breathing. Assisted pt back to bed to lie in prone position. Pt incontinent during bed mobility and required total A to change linens. Pt with desat into 70s, HR 130s and RR 40s. Daughter interpreting over phone and encouraging pt to "calm her breathing". Contacted respiratory. Pt placed on 15 L NR. SpO2 in 80s, however RR appeared to be improving. Pt left with nsg and RT in room. Pt willing to participate with OT, but limited due to activity tolerance at this time. Will continue to follow.   Follow Up Recommendations  CIR;Supervision/Assistance - 24 hour    Equipment Recommendations  3 in 1 bedside commode    Recommendations for Other Services Rehab consult    Precautions / Restrictions Precautions Precaution Comments: monitor sats , VS on HFNC at15  L sats drop into 70s       Mobility Bed Mobility Overal bed mobility: Needs Assistance Bed Mobility: Sit to Supine       Sit to supine: Supervision      Transfers Overall transfer level: Needs assistance   Transfers: Sit to/from Stand;Stand Pivot Transfers Sit to Stand: Min assist Stand pivot transfers: Min assist            Balance Overall balance assessment: Needs assistance   Sitting balance-Leahy Scale: Good       Standing balance-Leahy Scale: Fair                             ADL either performed or assessed with clinical judgement   ADL                                         General ADL Comments: ADL limited due to fatigue. O2 82 with good pleth siting with RR in 30s. Pt returned to bed to prone. Pt was incontinent of urine adn bed linens changed with pt rolling side to side. O2 dropped into 70s.      Vision       Perception     Praxis      Cognition Arousal/Alertness: Awake/alert Behavior During Therapy: Anxious Overall Cognitive Status: Within Functional Limits for tasks assessed                                 General Comments: daughter on phone interpreting, patiet appears WFL, followed all directions        Exercises     Shoulder Instructions       General Comments      Pertinent Vitals/ Pain       Pain Assessment: Faces Faces Pain Scale: Hurts a little bit Pain Location: back Pain Descriptors / Indicators: Discomfort Pain Intervention(s):  Limited activity within patient's tolerance  Home Living                                          Prior Functioning/Environment              Frequency  Min 3X/week        Progress Toward Goals  OT Goals(current goals can now be found in the care plan section)  Progress towards OT goals: Not progressing toward goals - comment(respiratory status)  Acute Rehab OT Goals Patient Stated Goal: to breath better OT Goal Formulation: With patient/family Time For Goal Achievement: 11/04/18 Potential to Achieve Goals: Good ADL Goals Pt Will Perform Upper Body Bathing: with modified independence;sitting Pt Will Perform Lower Body Bathing: with modified independence;sit to/from stand;with adaptive equipment Pt Will Perform Lower Body Dressing: with modified independence;sit to/from stand;with adaptive equipment Pt Will Transfer to Toilet: with modified independence;ambulating;bedside commode Pt Will Perform  Toileting - Clothing Manipulation and hygiene: with modified independence;sit to/from stand Pt/caregiver will Perform Home Exercise Program: Increased strength;With theraband;Both right and left upper extremity;With Supervision;With written HEP provided Additional ADL Goal #1: Pt will independently verbalize 3 energy conservation strategies for ADL  Plan Discharge plan remains appropriate    Co-evaluation                 AM-PAC OT "6 Clicks" Daily Activity     Outcome Measure   Help from another person eating meals?: None Help from another person taking care of personal grooming?: A Little Help from another person toileting, which includes using toliet, bedpan, or urinal?: A Lot Help from another person bathing (including washing, rinsing, drying)?: A Lot Help from another person to put on and taking off regular upper body clothing?: A Little Help from another person to put on and taking off regular lower body clothing?: A Lot 6 Click Score: 16    End of Session Equipment Utilized During Treatment: Oxygen(15L)  OT Visit Diagnosis: Unsteadiness on feet (R26.81);Muscle weakness (generalized) (M62.81)   Activity Tolerance Treatment limited secondary to medical complications (Comment)(respiratory status)   Patient Left in bed;with call bell/phone within reach;with nursing/sitter in room(RT present)   Nurse Communication Mobility status;Other (comment)(need to assess respiratory status)        Time: 1603-1700 OT Time Calculation (min): 57 min  Charges: OT General Charges $OT Visit: 1 Visit OT Treatments $Self Care/Home Management : 38-52 mins  Luisa DagoHilary Teresia Myint, OT/L   Acute OT Clinical Specialist Acute Rehabilitation Services Pager [phone] Office [phone]    [facility] 10/25/2018, 6:01 PM

## 2018-10-25 NOTE — Progress Notes (Signed)
Iv team called to say they may not be here until at least 7pm to start iv.. pt does not have access.. notified md.

## 2018-10-25 NOTE — Progress Notes (Signed)
Physical Therapy Treatment Patient Details Name: Christina Clark MRN: [id number] DOB: Dec 03, 1950 Today's Date: 10/25/2018    History of Present Illness 68 year old Spanish-speaking female with past medical history of morbid obesity, hypertension and depression  presenting to the emergency room 10/10/18 with 1 week of fever, cough and shortness of breath in the setting of known COVID-19 contacts.  Patient found to be COVID positive as well as hypoxic with bilateral infiltrates on chest x-ray.  Requiring oxygen support of high flow    PT Comments    The patient in recliner On HFNC SaO2 81%, HR 115, Switched to NRB mask with sats remaining in 70's. Added HFNC at 15 L and NRB wiith SaO2 increasing to 92%,, even 100% briefly. Patient's SaO2 stayed >84% throughout  Mobility and sitting on BSC x 10 minutes. Patient is noted to have high RR at 51 with activity. And HR up to 129.   After return to recliner, maintained both oxygen sources x 10 minutes to get SaO2 back up to 88% after drop to 70's. After NRB removed Sats dropped back into mid 80"s.  It appears patient requires NRB to keep sats up. RN aware of desatting. Continue as patient able to mobilize. O2 probe on patient's forehead.  Follow Up Recommendations  CIR     Equipment Recommendations  Rolling walker with 5" wheels    Recommendations for Other Services Rehab consult     Precautions / Restrictions Precautions Precaution Comments: monitor sats , VS on HFNC at15  L sats drop into 70s    Mobility  Bed Mobility               General bed mobility comments: OOB in chair  Transfers Overall transfer level: Needs assistance   Transfers: Sit to/from Stand;Stand Pivot Transfers           General transfer comment: Min A for stability, keeps eyes closed and  really appears to struggle with SOB, transfer  [facility], much extra time  for resting catching breath  Ambulation/Gait              General Gait Details: unable due to DOE   Stairs             Wheelchair Mobility    Modified Rankin (Stroke Patients Only)       Balance                                            Cognition Arousal/Alertness: Awake/alert Behavior During Therapy: Anxious                                   General Comments: daughter on phone interpreting, patiet appears WFL, followed all directions      Exercises      General Comments        Pertinent Vitals/Pain Pain Assessment: Faces Faces Pain Scale: Hurts little more Pain Location: back Pain Descriptors / Indicators: Discomfort Pain Intervention(s): Monitored during session    Home Living                      Prior Function            PT Goals (current goals can now be found in the care plan section) Progress towards  PT goals: Progressing toward goals    Frequency    Min 3X/week      PT Plan Current plan remains appropriate    Co-evaluation              AM-PAC PT "6 Clicks" Mobility   Outcome Measure  Help needed turning from your back to your side while in a flat bed without using bedrails?: A Lot Help needed moving from lying on your back to sitting on the side of a flat bed without using bedrails?: A Little Help needed moving to and from a bed to a chair (including a wheelchair)?: A Little Help needed standing up from a chair using your arms (e.g., wheelchair or bedside chair)?: A Little Help needed to walk in hospital room?: A Lot Help needed climbing 3-5 steps with a railing? : A Lot 6 Click Score: 15    End of Session Equipment Utilized During Treatment: Oxygen(15 L HFNC and 15L NRB at same time) Activity Tolerance: Treatment limited secondary to medical complications (Comment) Patient left: in chair;with call bell/phone within reach;with family/visitor present;with nursing/sitter in room Nurse Communication: Mobility status(desats) PT Visit  Diagnosis: Difficulty in walking, not elsewhere classified (R26.2)     Time: 2947-6546 PT Time Calculation (min) (ACUTE ONLY): 41 min  Charges:  $Therapeutic Activity: 38-52 mins                     [place] [phone] Office [phone]    [name] 10/25/2018, 1:00 PM

## 2018-10-26 ENCOUNTER — Inpatient Hospital Stay (HOSPITAL_COMMUNITY): Payer: Medicaid Other

## 2018-10-26 LAB — COMPREHENSIVE METABOLIC PANEL
ALT: 28 U/L (ref 0–44)
AST: 14 U/L — ABNORMAL LOW (ref 15–41)
Albumin: 2.9 g/dL — ABNORMAL LOW (ref 3.5–5.0)
Alkaline Phosphatase: 82 U/L (ref 38–126)
Anion gap: 13 (ref 5–15)
BUN: 27 mg/dL — ABNORMAL HIGH (ref 8–23)
CO2: 26 mmol/L (ref 22–32)
Calcium: 8.3 mg/dL — ABNORMAL LOW (ref 8.9–10.3)
Chloride: 102 mmol/L (ref 98–111)
Creatinine, Ser: 1.03 mg/dL — ABNORMAL HIGH (ref 0.44–1.00)
GFR calc Af Amer: >60 mL/min (ref >60)
GFR calc non Af Amer: 57 mL/min — ABNORMAL LOW (ref >60)
Glucose, Bld: 121 mg/dL — ABNORMAL HIGH (ref 70–99)
Potassium: 3.8 mmol/L (ref 3.5–5.1)
Sodium: 141 mmol/L (ref 135–145)
Total Bilirubin: 0.5 mg/dL (ref 0.3–1.2)
Total Protein: 6.3 g/dL — ABNORMAL LOW (ref 6.5–8.1)

## 2018-10-26 LAB — CBC WITH DIFFERENTIAL/PLATELET
Abs Immature Granulocytes: 0.1 10*3/uL — ABNORMAL HIGH (ref 0.00–0.07)
Basophils Absolute: 0.1 10*3/uL (ref 0.0–0.1)
Basophils Relative: 0 %
Eosinophils Absolute: 0.3 10*3/uL (ref 0.0–0.5)
Eosinophils Relative: 2 %
HCT: 40 % (ref 36.0–46.0)
Hemoglobin: 12.7 g/dL (ref 12.0–15.0)
Immature Granulocytes: 1 %
Lymphocytes Relative: 6 %
Lymphs Abs: 1.1 10*3/uL (ref 0.7–4.0)
MCH: 30.4 pg (ref 26.0–34.0)
MCHC: 31.8 g/dL (ref 30.0–36.0)
MCV: 95.7 fL (ref 80.0–100.0)
Monocytes Absolute: 0.8 10*3/uL (ref 0.1–1.0)
Monocytes Relative: 5 %
Neutro Abs: 16.1 10*3/uL — ABNORMAL HIGH (ref 1.7–7.7)
Neutrophils Relative %: 86 %
Platelets: 254 10*3/uL (ref 150–400)
RBC: 4.18 MIL/uL (ref 3.87–5.11)
RDW: 17.1 % — ABNORMAL HIGH (ref 11.5–15.5)
WBC: 18.5 10*3/uL — ABNORMAL HIGH (ref 4.0–10.5)
nRBC: 0.2 % (ref 0.0–0.2)

## 2018-10-26 LAB — PROCALCITONIN: Procalcitonin: 0.47 ng/mL

## 2018-10-26 LAB — MAGNESIUM: Magnesium: 2.3 mg/dL (ref 1.7–2.4)

## 2018-10-26 LAB — GLUCOSE, CAPILLARY
Glucose-Capillary: 127 mg/dL — ABNORMAL HIGH (ref 70–99)
Glucose-Capillary: 105 mg/dL — ABNORMAL HIGH (ref 70–99)
Glucose-Capillary: 145 mg/dL — ABNORMAL HIGH (ref 70–99)
Glucose-Capillary: 198 mg/dL — ABNORMAL HIGH (ref 70–99)

## 2018-10-26 LAB — D-DIMER, QUANTITATIVE: D-Dimer, Quant: 7.68 ug/mL-FEU — ABNORMAL HIGH (ref 0.00–0.50)

## 2018-10-26 LAB — FERRITIN: Ferritin: 161 ng/mL (ref 11–307)

## 2018-10-26 LAB — BRAIN NATRIURETIC PEPTIDE: B Natriuretic Peptide: 145.3 pg/mL — ABNORMAL HIGH (ref 0.0–100.0)

## 2018-10-26 LAB — C-REACTIVE PROTEIN: CRP: 4.1 mg/dL — ABNORMAL HIGH (ref <1.0)

## 2018-10-26 LAB — LACTATE DEHYDROGENASE: LDH: 513 U/L — ABNORMAL HIGH (ref 98–192)

## 2018-10-26 MED ORDER — FUROSEMIDE 10 MG/ML IJ SOLN
60.0000 mg | Freq: Once | INTRAMUSCULAR | Status: AC
  Administered 2018-10-26: 60 mg via INTRAVENOUS
  Filled 2018-10-26: qty 6

## 2018-10-26 MED ORDER — METHYLPREDNISOLONE SODIUM SUCC 125 MG IJ SOLR
60.0000 mg | Freq: Every day | INTRAMUSCULAR | Status: DC
  Administered 2018-10-26 – 2018-11-05 (×11): 60 mg via INTRAVENOUS
  Filled 2018-10-26 (×11): qty 2

## 2018-10-26 NOTE — TOC Initial Note (Signed)
Transition of Care [facility]) - Initial/Assessment Note    Patient Details  Name: Christina Clark MRN: [id number] Date of Birth: [birth year]  [facility] CM/SW Contact:    Midge Minium RN, BSN, NCM-BC, ACM-RN [phone] (working remotely) Phone Number: 10/26/2018, 1:02 PM  Clinical Narrative:                 CM following for transitional needs. 68 year old Spanish-speaking female with past medical history of morbid obesity, hypertension and depression; presented with 1 week of fever, cough and SOB; found to be COVID +. Patient lived at home and was independent with his ADLs with no AD in use PTA. PT/OT eval completed with CIR recommended. Patient continues to require a high oxygen demand and must meet the current CDC guidelines prior to transitioning to CIR. CM team will continue to follow.   Expected Discharge Plan: Skilled Nursing Facility Barriers to Discharge: Continued Medical Work up, Inadequate or no insurance(high oxygen demand; will require (2) negative COVID tests for CIR/72hrs with no fever or antipyretic)   Patient Goals and CMS Choice        Expected Discharge Plan and Services Expected Discharge Plan: [place] In-house Referral: Interpreting Services Discharge Planning Services: CM Consult Post Acute Care Choice: NA   Prior Living Arrangements/Services   Lives with:: Self  Activities of Daily Living Home Assistive Devices/Equipment: None ADL Screening (condition at time of admission) Patient's cognitive ability adequate to safely complete daily activities?: Yes Is the patient deaf or have difficulty hearing?: Yes Does the patient have difficulty seeing, even when wearing glasses/contacts?: No Does the patient have difficulty concentrating, remembering, or making decisions?: No Patient able to express need for assistance with ADLs?: Yes Does the patient have difficulty dressing or bathing?: No Independently performs ADLs?: Yes  (appropriate for developmental age) Does the patient have difficulty walking or climbing stairs?: No Weakness of Legs: None Weakness of Arms/Hands: None   Admission diagnosis:  [place] Patient Active Problem List   Diagnosis Date Noted  . Diabetes mellitus type 2, uncontrolled ([place]) 10/11/2018  . Hypertension 10/10/2018  . Depression 10/10/2018  . Acute respiratory disease due to COVID-19 virus 10/10/2018  . Acute respiratory failure with hypoxia ([place]) 10/10/2018  . Morbid obesity with BMI of 40.0-44.9, adult ([place]) 10/10/2018   PCP:  Patient, No Pcp Per Pharmacy:   [facility] [address] ([address]), [place]) [place] [address] Phone: [phone] Fax: [phone]     Social Determinants of Health (SDOH) Interventions    Readmission Risk Interventions No flowsheet data found.

## 2018-10-26 NOTE — Progress Notes (Signed)
Pt alert and oriented x4. Pt remained on NRB @ 15L throughout shift to maintain oxygen saturation above 85%. When slight position changes , pt can desat to low 80/upper 70s with supplemental oxygen. Pt trailed to HFNC @15L  and was unsuccessful. Respirations remain labored at rest.

## 2018-10-26 NOTE — Progress Notes (Signed)
MD at bedside, updated daughter on pts status and plan of care.

## 2018-10-26 NOTE — Progress Notes (Signed)
Notified Dr Candiss Norse that pt was as been on NRB all night, attempted to transition to HFNC @ 15L with no success, O2 sats in high 60s, transitioned back to NRB with sats in low 70s.  Notified RT.  Charge RN aware. MD suggested to place pt on high flow & NRB.  Awaiting equipment to transition pt to both as we do not currently have in room.

## 2018-10-26 NOTE — Progress Notes (Signed)
ICU CN requested to assess patient for possible transfer. Patient labored and hypoxic with exertion. High flow nasal cannula and non-rebreather utilized and patient's oxygen saturation improved. Dr. Candiss Norse came to the bedside to assess patient. No change in level of care. Dr. Candiss Norse spoke with patient and next of kin about the need for possible intubation without improvement. Pros and cons were reviewed.

## 2018-10-26 NOTE — Progress Notes (Addendum)
PROGRESS NOTE                                                                                                                                                                                                             Patient Demographics:    Christina Clark, is a 68 y.o. female, DOB - 1950-12-22, YYQ:[id number]  Outpatient Primary MD for the patient is Patient, No Pcp Per    LOS - 16  Admit date - 10/10/2018    No chief complaint on file.      Brief Narrative  68 year old Spanish-speaking female with past medical history of morbid obesity, hypertension and depression admitted to the hospitalist service on [place] for COVID pneumonia related acute hypoxic respiratory failure. She was started on Remdisivir, steroids and given Actemra x1.   Subjective:   Patient in bed, appears comfortable, denies any headache, no fever, no chest pain or pressure, ++ shortness of breath , no abdominal pain. No focal weakness.    Assessment  & Plan :     1. Acute Hypoxic Resp. Failure due to Acute Covid 19 Viral Pneumonitis during the ongoing 2020 Covid 19 Pandemic - she has already received and has completed her cynical course of Remdisivir, steroids, Plasma and Actemra, only mild improvement so far still tenuous and requiring 15 to 20 L high flow nasal cannula oxygen along with nonrebreather mask, some element of orthopnea as well.  Continue to advance activity, added flutter valve and I-S for pulmonary toiletry encouraged to sit up in chair and daytime.  Continue to titrate down oxygen and monitor clinically.  Inflammatory markers have stabilized.  Unfortunately patient continues to be tenuous and hypoxic, trial of Lasix was commenced on 10/25/2018 which I will continue and give her another dose of IV Lasix on 10/26/2018.  Detailed discussion with patient and family including her daughter and son-in-law were made on 10/25/2018  and 10/26/2018.  They do not want intubation but wants full medical treatment short of that.  This will be continued, they understand that there is a good chance patient might pass away this admission.  If she declines further goal of care will be comfort.  Short trial of IV steroids again as she continues to be tenuous and no further treatment left.   ABG     Component Value Date/Time  PHART 7.52 (H) 10/10/2018 1647   PCO2ART 34 10/10/2018 1647   PO2ART 53 (L) 10/10/2018 1647   HCO3 27.8 10/10/2018 1647   O2SAT 90.6 10/10/2018 1647    COVID-19 Labs  Recent Labs    10/24/18 0323 10/25/18 0323 10/25/18 0328 10/26/18 0300  DDIMER 5.83*  --  3.99* 7.68*  FERRITIN 262 212  --  161  LDH 518*  --  544* 513*  CRP 1.6* 2.8*  --  4.1*    Lab Results  Component Value Date   SARSCOV2NAA POSITIVE (A) 10/10/2018     Hepatic Function Latest Ref Rng & Units 10/26/2018 10/25/2018 10/22/2018  Total Protein 6.5 - 8.1 g/dL 6.3(L) 6.6 6.9  Albumin 3.5 - 5.0 g/dL 2.9(L) 3.0(L) 3.0(L)  AST 15 - 41 U/L 14(L) 15 18  ALT 0 - 44 U/L 28 32 47(H)  Alk Phosphatase 38 - 126 U/L 82 87 47  Total Bilirubin 0.3 - 1.2 mg/dL 0.5 0.3 0.8        Component Value Date/Time   BNP 145.3 (H) 10/26/2018 0300     2.  AKI.  Resolved after hydration.  3.  Essential hypertension.  Blood pressure stable not on any medications currently.  4.  DM depression.  On home dose Zoloft along with low-dose Xanax and stable.  5.  Morbid obesity BMI 41.  Follow with PCP for weight loss and diet control.   6.  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  Outpatient echocardiogram.  Trial of Lasix, continue beta-blocker and Nitropaste and monitor.   7.  Mild sinus tachycardia with hypotension.  Stable TSH resolved after gentle hydration.    Lab Results  Component Value Date   TSH 2.280 10/24/2018    8. DM type II in poor outpatient control due to hyperglycemia.  A1c 8.5.  She was not on any medications at home.   Currently on insulin.  CBG stable.  Will give her DM and insulin education.  CBG (last 3)  Recent Labs    10/25/18 1726 10/25/18 2215 10/26/18 0727  GLUCAP 119* 141* 127*    Condition - Extremely Guarded  Family Communication  :  daughter over the phone 10/24/18, 10/25/2018  Code Status : No intubation no CPR.  Diet :   Diet Order            DIET DYS 3 Room service appropriate? Yes; Fluid consistency: Thin  Diet effective now               Disposition Plan  :  PCU  Consults  :  None  Procedures  :  None  PUD Prophylaxis :   DVT Prophylaxis  :  Lovenox   Lab Results  Component Value Date   PLT 254 10/26/2018    Inpatient Medications  Scheduled Meds: . sodium chloride   Intravenous Once  . ALPRAZolam  0.25 mg Oral Daily  . enoxaparin (LOVENOX) injection  0.5 mg/kg Subcutaneous Q12H  . insulin aspart  0-20 Units Subcutaneous TID WC  . insulin aspart  0-5 Units Subcutaneous QHS  . insulin aspart  4 Units Subcutaneous TID WC  . insulin detemir  25 Units Subcutaneous QHS  . nitroGLYCERIN  0.5 inch Topical Q6H  . sertraline  50 mg Oral Daily  . sodium chloride flush  10-40 mL Intracatheter Q12H   Continuous Infusions:  PRN Meds:.acetaminophen, albuterol, ALPRAZolam, [DISCONTINUED] ondansetron **OR** ondansetron (ZOFRAN) IV, polyethylene glycol, sodium chloride  Antibiotics  :    Anti-infectives (From  admission, onward)   Start     Dose/Rate Route Frequency Ordered Stop   10/12/18 0900  remdesivir 100 mg in sodium chloride 0.9 % 230 mL IVPB     100 mg 500 mL/hr over 30 Minutes Intravenous Every 24 hours 10/11/18 0745 10/15/18 0928   10/11/18 0900  remdesivir 200 mg in sodium chloride 0.9 % 210 mL IVPB     200 mg 500 mL/hr over 30 Minutes Intravenous Once 10/11/18 0745 10/11/18 1617       Time Spent in minutes  30   Lala Lund M.D on 10/26/2018 at 10:56 AM  To page go to [email or web address] - password [facility]  [facility] -  Office  [phone]    See all Orders from today for further details    Objective:   Vitals:   10/26/18 0800 10/26/18 0815 10/26/18 0820 10/26/18 0830  BP:   (!) 121/51   Pulse: (!) 103 97 95 96  Resp: (!) 36 (!) 30 (!) 32 (!) 26  Temp:      TempSrc:      SpO2: (!) 71% (!) 85% (!) 88% 90%  Weight:      Height:        Wt Readings from Last 3 Encounters:  10/10/18 108.7 kg  10/10/18 109 kg     Intake/Output Summary (Last 24 hours) at 10/26/2018 1056 Last data filed at 10/25/2018 2200 Gross per 24 hour  Intake 120 ml  Output 900 ml  Net -780 ml     Physical Exam  Awake Alert,  No new F.N deficits  [place].AT,PERRAL Supple Neck,No JVD, No cervical lymphadenopathy appriciated.  Symmetrical Chest wall movement, Good air movement bilaterally, +ve rales RRR,No Gallops, Rubs or new Murmurs, No Parasternal Heave +ve B.Sounds, Abd Soft, No tenderness, No organomegaly appriciated, No rebound - guarding or rigidity. No Cyanosis, Clubbing or edema, No new Rash or bruise    Data Review:    CBC Recent Labs  Lab 10/21/18 0115 10/22/18 0115 10/24/18 0323 10/25/18 0328 10/26/18 0300  WBC 13.1* 13.8* 25.3* 22.9* 18.5*  HGB 13.0 13.3 13.0 13.5 12.7  HCT 40.3 40.4 39.7 41.9 40.0  PLT 330 331 300 276 254  MCV 94.4 93.5 95.9 96.5 95.7  MCH 30.4 30.8 31.4 31.1 30.4  MCHC 32.3 32.9 32.7 32.2 31.8  RDW 14.6 14.6 15.8* 16.2* 17.1*  LYMPHSABS  --   --   --  1.4 1.1  MONOABS  --   --   --  0.9 0.8  EOSABS  --   --   --  0.4 0.3  BASOSABS  --   --   --  0.1 0.1    Chemistries  Recent Labs  Lab 10/20/18 0050 10/21/18 0115 10/22/18 0115 10/23/18 0030 10/24/18 0323 10/25/18 0328 10/26/18 0300  NA 136 138 136 137 137 139 141  K 3.5 3.5 3.2* 3.9 3.8 3.8 3.8  CL 100 101 100 101 102 102 102  CO2 _0 GLUCOSE 192* 109* 149* 151* 148* 96 121*  BUN 56* 41* 39* 33* 31* 27* 27*  CREATININE 1.15* 0.98 1.00 0.91 0.99 0.86 1.03*  CALCIUM 8.5* 8.5* 8.5* 8.7* 8.4* 8.7* 8.3*  MG  --   --    --   --   --  2.7* 2.3  AST _1 --   --  15 14*  ALT 60* 52* 47*  --   --  32 28  ALKPHOS 40  40 47  --   --  87 82  BILITOT 0.7 0.6 0.8  --   --  0.3 0.5   ------------------------------------------------------------------------------------------------------------------ No results for input(s): CHOL, HDL, LDLCALC, TRIG, CHOLHDL, LDLDIRECT in the last 72 hours.  Lab Results  Component Value Date   HGBA1C 8.1 (H) 10/11/2018   ------------------------------------------------------------------------------------------------------------------ Recent Labs    10/24/18 0323  TSH 2.280    Cardiac Enzymes No results for input(s): CKMB, TROPONINI, MYOGLOBIN in the last 168 hours.  Invalid input(s): CK ------------------------------------------------------------------------------------------------------------------    Component Value Date/Time   BNP 145.3 (H) 10/26/2018 0300    Micro Results Recent Results (from the past 240 hour(s))  MRSA PCR Screening     Status: None   Collection Time: 10/16/18  4:40 PM   Specimen: Nasal Mucosa; Nasopharyngeal  Result Value Ref Range Status   MRSA by PCR NEGATIVE NEGATIVE Final    Comment:        The GeneXpert MRSA Assay (FDA approved for NASAL specimens only), is one component of a comprehensive MRSA colonization surveillance program. It is not intended to diagnose MRSA infection nor to guide or monitor treatment for MRSA infections. Performed at [facility], [place] [address], [place], [place] [address]   Culture, Urine     Status: Abnormal   Collection Time: 10/24/18  7:21 AM   Specimen: Urine, Clean Catch  Result Value Ref Range Status   Specimen Description   Final    URINE, CLEAN CATCH Performed at [facility], [place] [address], [place], [place] [address]    Special Requests   Final    NONE Performed at [facility], [place] [address], [place], [place] [address]     Culture (A)  Final    <10,000 COLONIES/mL INSIGNIFICANT GROWTH Performed at [facility] [address] [place], [place] [address]    Report Status 10/25/2018 FINAL  Final    Radiology Reports Dg Chest Port 1 View  Result Date: 10/26/2018 CLINICAL DATA:  Acute respiratory disease.  Hypoxia. EXAM: PORTABLE CHEST 1 VIEW COMPARISON:  10/24/2018 FINDINGS: Diffuse bilateral interstitial and alveolar airspace opacities. No significant interval change compared with the prior exam. No pleural effusion or pneumothorax. Stable cardiomediastinal silhouette. No aggressive osseous lesion. IMPRESSION: Stable diffuse bilateral interstitial and alveolar airspace disease most consistent with multilobar pneumonia including atypical pneumonia. Electronically Signed   By: Kathreen Devoid   On: 10/26/2018 09:39   Dg Chest Port 1 View  Result Date: 10/24/2018 CLINICAL DATA:  Order for SOB  Hx of HTN EXAM: PORTABLE CHEST - 1 VIEW COMPARISON:  10/22/2018 FINDINGS: Progression of bilateral diffuse coarse airspace opacities with relative sparing of the apices. Heart size and mediastinal contours are within normal limits. Aortic Atherosclerosis (ICD10-170.0). No effusion. Visualized bones unremarkable. IMPRESSION: Worsening bilateral infiltrates or edema. Electronically Signed   By: Lucrezia Europe M.D.   On: 10/24/2018 08:55   Dg Chest Port 1 View  Result Date: 10/22/2018 CLINICAL DATA:  Pneumonia. EXAM: PORTABLE CHEST 1 VIEW COMPARISON:  Radiograph of October 10, 2018. FINDINGS: The heart size and mediastinal contours are within normal limits. No pneumothorax or pleural effusion is noted. Slightly improved bilateral lung opacities are noted consistent with multifocal pneumonia. The visualized skeletal structures are unremarkable. IMPRESSION: Slightly improved multifocal pneumonia. Electronically Signed   By: Marijo Conception M.D.   On: 10/22/2018 11:32   Dg Chest Port 1 View  Result Date: 10/10/2018 CLINICAL DATA:  Fever  and  shortness of breath EXAM: PORTABLE CHEST 1 VIEW COMPARISON:  None. FINDINGS: There are bilateral streaky airspace opacities, worst in the right upper lobe. Cardiomediastinal contours are normal. No pneumothorax or sizable pleural effusion. IMPRESSION: Bilateral streaky airspace opacities which may indicate multifocal infection. Electronically Signed   By: Ulyses Jarred M.D.   On: 10/10/2018 17:30

## 2018-10-26 NOTE — Progress Notes (Addendum)
OT Cancellation Note  Patient Details Name: Christina Clark MRN: [id number] DOB: 04-12-1951   Cancelled Treatment:    Reason Eval/Treat Not Completed: Medical issues which prohibited therapy; pt with worsening respiratory status, noted requiring NRB and desatting with positional changes. Spoke with RN who also request hold therapy at this time. Will continue to follow.   Lou Cal, OT [facility] Pager [phone] Office [place] 10/26/2018, 1:47 PM

## 2018-10-27 LAB — CBC WITH DIFFERENTIAL/PLATELET
Abs Immature Granulocytes: 0.09 10*3/uL — ABNORMAL HIGH (ref 0.00–0.07)
Basophils Absolute: 0 10*3/uL (ref 0.0–0.1)
Basophils Relative: 0 %
Eosinophils Absolute: 0 10*3/uL (ref 0.0–0.5)
Eosinophils Relative: 0 %
HCT: 41.9 % (ref 36.0–46.0)
Hemoglobin: 13.3 g/dL (ref 12.0–15.0)
Immature Granulocytes: 1 %
Lymphocytes Relative: 5 %
Lymphs Abs: 0.9 10*3/uL (ref 0.7–4.0)
MCH: 30.6 pg (ref 26.0–34.0)
MCHC: 31.7 g/dL (ref 30.0–36.0)
MCV: 96.3 fL (ref 80.0–100.0)
Monocytes Absolute: 1 10*3/uL (ref 0.1–1.0)
Monocytes Relative: 6 %
Neutro Abs: 16.4 10*3/uL — ABNORMAL HIGH (ref 1.7–7.7)
Neutrophils Relative %: 88 %
Platelets: 232 10*3/uL (ref 150–400)
RBC: 4.35 MIL/uL (ref 3.87–5.11)
RDW: 17.3 % — ABNORMAL HIGH (ref 11.5–15.5)
WBC: 18.4 10*3/uL — ABNORMAL HIGH (ref 4.0–10.5)
nRBC: 0.1 % (ref 0.0–0.2)

## 2018-10-27 LAB — COMPREHENSIVE METABOLIC PANEL
ALT: 27 U/L (ref 0–44)
AST: 15 U/L (ref 15–41)
Albumin: 3.1 g/dL — ABNORMAL LOW (ref 3.5–5.0)
Alkaline Phosphatase: 91 U/L (ref 38–126)
Anion gap: 13 (ref 5–15)
BUN: 40 mg/dL — ABNORMAL HIGH (ref 8–23)
CO2: 29 mmol/L (ref 22–32)
Calcium: 8.7 mg/dL — ABNORMAL LOW (ref 8.9–10.3)
Chloride: 101 mmol/L (ref 98–111)
Creatinine, Ser: 0.97 mg/dL (ref 0.44–1.00)
GFR calc Af Amer: >60 mL/min (ref >60)
GFR calc non Af Amer: >60 mL/min (ref >60)
Glucose, Bld: 119 mg/dL — ABNORMAL HIGH (ref 70–99)
Potassium: 3.7 mmol/L (ref 3.5–5.1)
Sodium: 143 mmol/L (ref 135–145)
Total Bilirubin: 0.5 mg/dL (ref 0.3–1.2)
Total Protein: 7 g/dL (ref 6.5–8.1)

## 2018-10-27 LAB — FERRITIN: Ferritin: 199 ng/mL (ref 11–307)

## 2018-10-27 LAB — BRAIN NATRIURETIC PEPTIDE: B Natriuretic Peptide: 106.7 pg/mL — ABNORMAL HIGH (ref 0.0–100.0)

## 2018-10-27 LAB — D-DIMER, QUANTITATIVE: D-Dimer, Quant: 3.14 ug/mL-FEU — ABNORMAL HIGH (ref 0.00–0.50)

## 2018-10-27 LAB — GLUCOSE, CAPILLARY
Glucose-Capillary: 143 mg/dL — ABNORMAL HIGH (ref 70–99)
Glucose-Capillary: 177 mg/dL — ABNORMAL HIGH (ref 70–99)
Glucose-Capillary: 212 mg/dL — ABNORMAL HIGH (ref 70–99)
Glucose-Capillary: 229 mg/dL — ABNORMAL HIGH (ref 70–99)

## 2018-10-27 LAB — LACTATE DEHYDROGENASE: LDH: 543 U/L — ABNORMAL HIGH (ref 98–192)

## 2018-10-27 LAB — C-REACTIVE PROTEIN: CRP: 5.3 mg/dL — ABNORMAL HIGH (ref <1.0)

## 2018-10-27 LAB — MAGNESIUM: Magnesium: 2.7 mg/dL — ABNORMAL HIGH (ref 1.7–2.4)

## 2018-10-27 MED ORDER — MORPHINE SULFATE (PF) 2 MG/ML IV SOLN
1.0000 mg | INTRAVENOUS | Status: DC | PRN
  Administered 2018-10-27: 1 mg via INTRAVENOUS
  Administered 2018-10-27 – 2018-10-28 (×5): 2 mg via INTRAVENOUS
  Administered 2018-11-02: 1 mg via INTRAVENOUS
  Filled 2018-10-27 (×7): qty 1

## 2018-10-27 MED ORDER — FUROSEMIDE 10 MG/ML IJ SOLN
80.0000 mg | Freq: Once | INTRAMUSCULAR | Status: AC
  Administered 2018-10-27: 10:00:00 80 mg via INTRAVENOUS
  Filled 2018-10-27: qty 8

## 2018-10-27 MED ORDER — MENTHOL 3 MG MT LOZG
1.0000 | LOZENGE | OROMUCOSAL | Status: DC | PRN
  Filled 2018-10-27: qty 9

## 2018-10-27 MED ORDER — POTASSIUM CHLORIDE 10 MEQ/100ML IV SOLN
10.0000 meq | INTRAVENOUS | Status: AC
  Administered 2018-10-27 (×2): 10 meq via INTRAVENOUS
  Filled 2018-10-27 (×2): qty 100

## 2018-10-27 MED ORDER — POTASSIUM CHLORIDE CRYS ER 20 MEQ PO TBCR
40.0000 meq | EXTENDED_RELEASE_TABLET | Freq: Once | ORAL | Status: DC
  Filled 2018-10-27: qty 2

## 2018-10-27 MED ORDER — LORAZEPAM 2 MG/ML IJ SOLN
0.5000 mg | Freq: Once | INTRAMUSCULAR | Status: AC
  Administered 2018-10-27: 0.5 mg via INTRAVENOUS
  Filled 2018-10-27: qty 1

## 2018-10-27 MED ORDER — LIP MEDEX EX OINT
TOPICAL_OINTMENT | CUTANEOUS | Status: DC | PRN
  Filled 2018-10-27: qty 7

## 2018-10-27 NOTE — Progress Notes (Signed)
PROGRESS NOTE                                                                                                                                                                                                             Patient Demographics:    Christina Clark, is a 68 y.o. female, DOB - 1951/02/23, BSJ:[id number]  Outpatient Primary MD for the patient is Patient, No Pcp Per    LOS - 17  Admit date - 10/10/2018    No chief complaint on file.      Brief Narrative  68 year old Spanish-speaking female with past medical history of morbid obesity, hypertension and depression admitted to the hospitalist service on [place] for COVID pneumonia related acute hypoxic respiratory failure. She was started on Remdisivir, steroids and given Actemra x1.   Subjective:   Patient in bed, appears comfortable, denies any headache, no fever, no chest pain or pressure, no shortness of breath , no abdominal pain. No focal weakness.   Assessment  & Plan :     Acute Hypoxic Resp. Failure due to Acute Covid 19 Viral Pneumonitis during the ongoing 2020 Covid 19 Pandemic - she has already received and has completed her cynical course of Remdisivir, steroids, Plasma and Actemra, only mild improvement so far still tenuous and requiring 15 to 20 L high flow nasal cannula oxygen along with nonrebreather mask, some element of orthopnea as well.  Continue to advance activity, added flutter valve and I-S for pulmonary toiletry encouraged to sit up in chair and daytime.  Inflammatory markers are borderline.  Unfortunately patient continues to be tenuous and hypoxic.  Note since that she has developed a touch of CHF proning will not help her, advised nursing staff to keep the head of the bed propped up at least 90 degrees if she is in bed or preferably sit in chair in the daytime.  Detailed discussion with patient and family including her daughter  and son-in-law were made on 10/25/2018 and 10/26/2018.  They do not want intubation but wants full medical treatment short of that.  This will be continued, they understand that there is a good chance patient might pass away this admission.  If she declines further goal of care will be comfort.  Short trial of IV steroids again as she continues to be tenuous and no further  treatment left along with Lasix for ongoing diuresis.Marland Kitchen  COVID-19 Labs  Recent Labs    10/25/18 0323 10/25/18 0328 10/26/18 0300 10/27/18 0550  DDIMER  --  3.99* 7.68* 3.14*  FERRITIN 212  --  161  --   LDH  --  544* 513* 543*  CRP 2.8*  --  4.1* 5.3*    Lab Results  Component Value Date   SARSCOV2NAA POSITIVE (A) 10/10/2018     Hepatic Function Latest Ref Rng & Units 10/27/2018 10/26/2018 10/25/2018  Total Protein 6.5 - 8.1 g/dL 7.0 6.3(L) 6.6  Albumin 3.5 - 5.0 g/dL 3.1(L) 2.9(L) 3.0(L)  AST 15 - 41 U/L 15 14(L) 15  ALT 0 - 44 U/L 27 28 32  Alk Phosphatase 38 - 126 U/L 91 82 87  Total Bilirubin 0.3 - 1.2 mg/dL 0.5 0.5 0.3        Component Value Date/Time   BNP 106.7 (H) 10/27/2018 0550     2.  AKI.  Resolved after hydration.  3.  Essential hypertension.  Blood pressure stable not on any medications currently.  4.  DM depression.  On home dose Zoloft along with low-dose Xanax and stable.  5.  Morbid obesity BMI 41.  Follow with PCP for weight loss and diet control.   6.  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  Outpatient echocardiogram.  Trial of Lasix, continue beta-blocker and Nitropaste and monitor.  Avoid proning.  7.  Mild sinus tachycardia with hypotension.  Stable TSH, resolved.  Lab Results  Component Value Date   TSH 2.280 10/24/2018    8. DM type II in poor outpatient control due to hyperglycemia.  A1c 8.5.  She was not on any medications at home.  Currently on insulin.  CBG stable.  Will give her DM and insulin education.  CBG (last 3)  Recent Labs    10/26/18 1619 10/26/18 2057  10/27/18 0809  GLUCAP 145* 198* 143*    Condition - Extremely Guarded  Family Communication  :  daughter over the phone 10/24/18, 10/25/2018, 10/26/18  Code Status : No intubation no CPR.  Diet :   Diet Order            DIET DYS 3 Room service appropriate? Yes; Fluid consistency: Thin  Diet effective now               Disposition Plan  :  PCU  Consults  :  None  Procedures  :  None  PUD Prophylaxis :   DVT Prophylaxis  :  Lovenox   Lab Results  Component Value Date   PLT 232 10/27/2018    Inpatient Medications  Scheduled Meds: . sodium chloride   Intravenous Once  . ALPRAZolam  0.25 mg Oral Daily  . enoxaparin (LOVENOX) injection  0.5 mg/kg Subcutaneous Q12H  . furosemide  80 mg Intravenous Once  . insulin aspart  0-20 Units Subcutaneous TID WC  . insulin aspart  0-5 Units Subcutaneous QHS  . insulin aspart  4 Units Subcutaneous TID WC  . insulin detemir  25 Units Subcutaneous QHS  . methylPREDNISolone (SOLU-MEDROL) injection  60 mg Intravenous Daily  . nitroGLYCERIN  0.5 inch Topical Q6H  . potassium chloride  40 mEq Oral Once  . sertraline  50 mg Oral Daily  . sodium chloride flush  10-40 mL Intracatheter Q12H   Continuous Infusions:  PRN Meds:.acetaminophen, albuterol, ALPRAZolam, morphine injection, [DISCONTINUED] ondansetron **OR** ondansetron (ZOFRAN) IV, polyethylene glycol, sodium chloride  Antibiotics  :  Anti-infectives (From admission, onward)   Start     Dose/Rate Route Frequency Ordered Stop   10/12/18 0900  remdesivir 100 mg in sodium chloride 0.9 % 230 mL IVPB     100 mg 500 mL/hr over 30 Minutes Intravenous Every 24 hours 10/11/18 0745 10/15/18 0928   10/11/18 0900  remdesivir 200 mg in sodium chloride 0.9 % 210 mL IVPB     200 mg 500 mL/hr over 30 Minutes Intravenous Once 10/11/18 0745 10/11/18 1617       Time Spent in minutes  30   Lala Lund M.D on 10/27/2018 at 9:02 AM  To page go to [email or web address] - password [facility]   [facility] -  Office  [phone]   See all Orders from today for further details    Objective:   Vitals:   10/27/18 0500 10/27/18 0600 10/27/18 0700 10/27/18 0800  BP: (!) 94/58 96/63 (!) 101/59 125/68  Pulse: 88 86 90 85  Resp: (!) 21 (!) 22 (!) 23 20  Temp:    97.6 F (36.4 C)  TempSrc:    Axillary  SpO2: (!) 84% (!) 84% (!) 86% (!) 85%  Weight:      Height:        Wt Readings from Last 3 Encounters:  10/10/18 108.7 kg  10/10/18 109 kg     Intake/Output Summary (Last 24 hours) at 10/27/2018 0902 Last data filed at 10/27/2018 0400 Gross per 24 hour  Intake 410 ml  Output 1681 ml  Net -1271 ml     Physical Exam  Awake Alert,   No new F.N deficits  [place].AT,PERRAL Supple Neck,No JVD, No cervical lymphadenopathy appriciated.  Symmetrical Chest wall movement, Good air movement bilaterally, +ve rales RRR,No Gallops, Rubs or new Murmurs, No Parasternal Heave +ve B.Sounds, Abd Soft, No tenderness, No organomegaly appriciated, No rebound - guarding or rigidity. No Cyanosis, Clubbing or edema, No new Rash or bruise    Data Review:    CBC Recent Labs  Lab 10/22/18 0115 10/24/18 0323 10/25/18 0328 10/26/18 0300 10/27/18 0550  WBC 13.8* 25.3* 22.9* 18.5* 18.4*  HGB 13.3 13.0 13.5 12.7 13.3  HCT 40.4 39.7 41.9 40.0 41.9  PLT 331 300 276 254 232  MCV 93.5 95.9 96.5 95.7 96.3  MCH 30.8 31.4 31.1 30.4 30.6  MCHC 32.9 32.7 32.2 31.8 31.7  RDW 14.6 15.8* 16.2* 17.1* 17.3*  LYMPHSABS  --   --  1.4 1.1 0.9  MONOABS  --   --  0.9 0.8 1.0  EOSABS  --   --  0.4 0.3 0.0  BASOSABS  --   --  0.1 0.1 0.0    Chemistries  Recent Labs  Lab 10/21/18 0115 10/22/18 0115 10/23/18 0030 10/24/18 0323 10/25/18 0328 10/26/18 0300 10/27/18 0550  NA 138 136 137 137 139 141 143  K 3.5 3.2* 3.9 3.8 3.8 3.8 3.7  CL 101 100 101 102 102 102 101  CO2 '27 24 24 24 27 26 29  '$ GLUCOSE 109* 149* 151* 148* 96 121* 119*  BUN 41* 39* 33* 31* 27* 27* 40*  CREATININE 0.98 1.00  0.91 0.99 0.86 1.03* 0.97  CALCIUM 8.5* 8.5* 8.7* 8.4* 8.7* 8.3* 8.7*  MG  --   --   --   --  2.7* 2.3 2.7*  AST 25 18  --   --  15 14* 15  ALT 52* 47*  --   --  32 28 27  ALKPHOS 40 47  --   --  87 82 91  BILITOT 0.6 0.8  --   --  0.3 0.5 0.5   ------------------------------------------------------------------------------------------------------------------ No results for input(s): CHOL, HDL, LDLCALC, TRIG, CHOLHDL, LDLDIRECT in the last 72 hours.  Lab Results  Component Value Date   HGBA1C 8.1 (H) 10/11/2018   ------------------------------------------------------------------------------------------------------------------ No results for input(s): TSH, T4TOTAL, T3FREE, THYROIDAB in the last 72 hours.  Invalid input(s): FREET3  Cardiac Enzymes No results for input(s): CKMB, TROPONINI, MYOGLOBIN in the last 168 hours.  Invalid input(s): CK ------------------------------------------------------------------------------------------------------------------    Component Value Date/Time   BNP 106.7 (H) 10/27/2018 0550    Micro Results Recent Results (from the past 240 hour(s))  Culture, Urine     Status: Abnormal   Collection Time: 10/24/18  7:21 AM   Specimen: Urine, Clean Catch  Result Value Ref Range Status   Specimen Description   Final    URINE, CLEAN CATCH Performed at [facility], [place] [address] [place], [place] [address]    Special Requests   Final    NONE Performed at [facility], [employer] [address], [place], [place] [address]    Culture (A)  Final    <10,000 COLONIES/mL INSIGNIFICANT GROWTH Performed at [facility] [address] [place], [place] [address]    Report Status 10/25/2018 FINAL  Final    Radiology Reports Dg Chest Port 1 View  Result Date: 10/26/2018 CLINICAL DATA:  Acute respiratory disease.  Hypoxia. EXAM: PORTABLE CHEST 1 VIEW COMPARISON:  10/24/2018 FINDINGS: Diffuse bilateral interstitial and  alveolar airspace opacities. No significant interval change compared with the prior exam. No pleural effusion or pneumothorax. Stable cardiomediastinal silhouette. No aggressive osseous lesion. IMPRESSION: Stable diffuse bilateral interstitial and alveolar airspace disease most consistent with multilobar pneumonia including atypical pneumonia. Electronically Signed   By: Kathreen Devoid   On: 10/26/2018 09:39   Dg Chest Port 1 View  Result Date: 10/24/2018 CLINICAL DATA:  Order for SOB  Hx of HTN EXAM: PORTABLE CHEST - 1 VIEW COMPARISON:  10/22/2018 FINDINGS: Progression of bilateral diffuse coarse airspace opacities with relative sparing of the apices. Heart size and mediastinal contours are within normal limits. Aortic Atherosclerosis (ICD10-170.0). No effusion. Visualized bones unremarkable. IMPRESSION: Worsening bilateral infiltrates or edema. Electronically Signed   By: Lucrezia Europe M.D.   On: 10/24/2018 08:55   Dg Chest Port 1 View  Result Date: 10/22/2018 CLINICAL DATA:  Pneumonia. EXAM: PORTABLE CHEST 1 VIEW COMPARISON:  Radiograph of October 10, 2018. FINDINGS: The heart size and mediastinal contours are within normal limits. No pneumothorax or pleural effusion is noted. Slightly improved bilateral lung opacities are noted consistent with multifocal pneumonia. The visualized skeletal structures are unremarkable. IMPRESSION: Slightly improved multifocal pneumonia. Electronically Signed   By: Marijo Conception M.D.   On: 10/22/2018 11:32   Dg Chest Port 1 View  Result Date: 10/10/2018 CLINICAL DATA:  Fever and shortness of breath EXAM: PORTABLE CHEST 1 VIEW COMPARISON:  None. FINDINGS: There are bilateral streaky airspace opacities, worst in the right upper lobe. Cardiomediastinal contours are normal. No pneumothorax or sizable pleural effusion. IMPRESSION: Bilateral streaky airspace opacities which may indicate multifocal infection. Electronically Signed   By: Ulyses Jarred M.D.   On: 10/10/2018 17:30

## 2018-10-27 NOTE — Progress Notes (Signed)
Pt transferred to 9146 for heated high flow. Pt lying prone with Sp02 in the 70s, HR in the 120s,RR in the 40s. [place] on 45 lpm and 100% Fio2, a NRB was added at 100% with slight improvement in oxygenation (low to mid 80s) . MD notified, as pt is receiving maximum non-invasive oxygen therapy. Orders received for PRN morphine for respiratory distress. Pt was on speakerphone with family, who received real time updates.

## 2018-10-27 NOTE — Progress Notes (Signed)
Rapid called due to increased work of breathing.  Patient is a DNI and heated high flow was added.  Will continue to monitor and see if she needs to be transferred to ICU for closer observation.

## 2018-10-28 LAB — CBC WITH DIFFERENTIAL/PLATELET
Abs Immature Granulocytes: 0.13 10*3/uL — ABNORMAL HIGH (ref 0.00–0.07)
Basophils Absolute: 0 10*3/uL (ref 0.0–0.1)
Basophils Relative: 0 %
Eosinophils Absolute: 0.2 10*3/uL (ref 0.0–0.5)
Eosinophils Relative: 1 %
HCT: 42.7 % (ref 36.0–46.0)
Hemoglobin: 13.5 g/dL (ref 12.0–15.0)
Immature Granulocytes: 1 %
Lymphocytes Relative: 4 %
Lymphs Abs: 0.8 10*3/uL (ref 0.7–4.0)
MCH: 30.8 pg (ref 26.0–34.0)
MCHC: 31.6 g/dL (ref 30.0–36.0)
MCV: 97.5 fL (ref 80.0–100.0)
Monocytes Absolute: 1 10*3/uL (ref 0.1–1.0)
Monocytes Relative: 5 %
Neutro Abs: 17 10*3/uL — ABNORMAL HIGH (ref 1.7–7.7)
Neutrophils Relative %: 89 %
Platelets: 223 10*3/uL (ref 150–400)
RBC: 4.38 MIL/uL (ref 3.87–5.11)
RDW: 17.4 % — ABNORMAL HIGH (ref 11.5–15.5)
WBC: 19.2 10*3/uL — ABNORMAL HIGH (ref 4.0–10.5)
nRBC: 0 % (ref 0.0–0.2)

## 2018-10-28 LAB — GLUCOSE, CAPILLARY
Glucose-Capillary: 149 mg/dL — ABNORMAL HIGH (ref 70–99)
Glucose-Capillary: 290 mg/dL — ABNORMAL HIGH (ref 70–99)
Glucose-Capillary: 238 mg/dL — ABNORMAL HIGH (ref 70–99)
Glucose-Capillary: 135 mg/dL — ABNORMAL HIGH (ref 70–99)

## 2018-10-28 LAB — COMPREHENSIVE METABOLIC PANEL
ALT: 24 U/L (ref 0–44)
AST: 14 U/L — ABNORMAL LOW (ref 15–41)
Albumin: 3.2 g/dL — ABNORMAL LOW (ref 3.5–5.0)
Alkaline Phosphatase: 86 U/L (ref 38–126)
Anion gap: 14 (ref 5–15)
BUN: 47 mg/dL — ABNORMAL HIGH (ref 8–23)
CO2: 31 mmol/L (ref 22–32)
Calcium: 8.8 mg/dL — ABNORMAL LOW (ref 8.9–10.3)
Chloride: 94 mmol/L — ABNORMAL LOW (ref 98–111)
Creatinine, Ser: 0.99 mg/dL (ref 0.44–1.00)
GFR calc Af Amer: >60 mL/min (ref >60)
GFR calc non Af Amer: 59 mL/min — ABNORMAL LOW (ref >60)
Glucose, Bld: 146 mg/dL — ABNORMAL HIGH (ref 70–99)
Potassium: 3.5 mmol/L (ref 3.5–5.1)
Sodium: 139 mmol/L (ref 135–145)
Total Bilirubin: 0.7 mg/dL (ref 0.3–1.2)
Total Protein: 7.2 g/dL (ref 6.5–8.1)

## 2018-10-28 LAB — LACTATE DEHYDROGENASE: LDH: 509 U/L — ABNORMAL HIGH (ref 98–192)

## 2018-10-28 LAB — C-REACTIVE PROTEIN: CRP: 3.2 mg/dL — ABNORMAL HIGH (ref <1.0)

## 2018-10-28 LAB — MAGNESIUM: Magnesium: 2.7 mg/dL — ABNORMAL HIGH (ref 1.7–2.4)

## 2018-10-28 LAB — FERRITIN: Ferritin: 223 ng/mL (ref 11–307)

## 2018-10-28 LAB — BRAIN NATRIURETIC PEPTIDE: B Natriuretic Peptide: 73.8 pg/mL (ref 0.0–100.0)

## 2018-10-28 LAB — D-DIMER, QUANTITATIVE: D-Dimer, Quant: 2.12 ug/mL-FEU — ABNORMAL HIGH (ref 0.00–0.50)

## 2018-10-28 MED ORDER — ENSURE MAX PROTEIN PO LIQD
11.0000 [oz_av] | Freq: Every day | ORAL | Status: DC
  Administered 2018-10-28: 10:00:00 11 [oz_av] via ORAL
  Filled 2018-10-28: qty 330

## 2018-10-28 MED ORDER — GLUCERNA SHAKE PO LIQD
237.0000 mL | Freq: Two times a day (BID) | ORAL | Status: DC
  Administered 2018-10-28 – 2018-10-29 (×2): 237 mL via ORAL
  Filled 2018-10-28 (×4): qty 237

## 2018-10-28 MED ORDER — FUROSEMIDE 10 MG/ML IJ SOLN
40.0000 mg | Freq: Once | INTRAMUSCULAR | Status: AC
  Administered 2018-10-28: 40 mg via INTRAVENOUS
  Filled 2018-10-28: qty 4

## 2018-10-28 MED ORDER — ENSURE MAX PROTEIN PO LIQD
11.0000 [oz_av] | Freq: Every day | ORAL | Status: DC
  Administered 2018-10-29: 237 mL via ORAL
  Administered 2018-10-29 – 2018-11-02 (×5): 11 [oz_av] via ORAL
  Filled 2018-10-28 (×5): qty 330

## 2018-10-28 MED ORDER — GLUCERNA PO LIQD
237.0000 mL | Freq: Two times a day (BID) | ORAL | Status: DC
  Administered 2018-10-28: 237 mL via ORAL
  Filled 2018-10-28 (×2): qty 237

## 2018-10-28 MED ORDER — POTASSIUM CHLORIDE CRYS ER 20 MEQ PO TBCR
40.0000 meq | EXTENDED_RELEASE_TABLET | Freq: Once | ORAL | Status: AC
  Administered 2018-10-28: 40 meq via ORAL
  Filled 2018-10-28: qty 2

## 2018-10-28 NOTE — Progress Notes (Signed)
PROGRESS NOTE                                                                                                                                                                                                             Patient Demographics:    Christina Clark, is a 68 y.o. female, DOB - [birth year], LFY:[id number]  Outpatient Primary MD for the patient is Patient, No Pcp Per    LOS - 18  Admit date - 10/10/2018    No chief complaint on file.      Brief Narrative  68 year old Spanish-speaking female with past medical history of morbid obesity, hypertension and depression admitted to the hospitalist service on [place] for COVID pneumonia related acute hypoxic respiratory failure. She was started on Remdisivir, steroids and given Actemra x1.   Subjective:   Patient in bed, appears comfortable, denies any headache, no fever, no chest pain or pressure, improved shortness of breath , no abdominal pain. No focal weakness.   Assessment  & Plan :     Acute Hypoxic Resp. Failure due to Acute Covid 19 Viral Pneumonitis during the ongoing 2020 Covid 19 Pandemic - she has already received and has completed her cynical course of Remdisivir, steroids, Plasma and Actemra, only mild improvement so far still tenuous and requiring 15 to 20 L high flow nasal cannula oxygen along with nonrebreather mask, some element of orthopnea as well.  Continue to advance activity, added flutter valve and I-S for pulmonary toiletry encouraged to sit up in chair and daytime.  Inflammatory markers are borderline.  Unfortunately patient continues to be tenuous and hypoxic.  Note since that she has developed a touch of CHF proning will not help her, advised nursing staff to keep the head of the bed propped up at least 90 degrees if she is in bed or preferably sit in chair in the daytime.  Detailed discussion with patient and family including her  daughter and son-in-law were made on 10/25/2018 and 10/26/2018.  They do not want intubation but wants full medical treatment short of that.  This will be continued, they understand that there is a good chance patient might pass away this admission.  If she declines further goal of care will be comfort.  Short trial of IV steroids again as she continues to be tenuous and no further  treatment left along with Lasix for ongoing diuresis.Marland Kitchen  COVID-19 Labs  Recent Labs    10/26/18 0300 10/27/18 0550 10/28/18 0548  DDIMER 7.68* 3.14* 2.12*  FERRITIN 161 199 223  LDH 513* 543* 509*  CRP 4.1* 5.3* 3.2*    Lab Results  Component Value Date   SARSCOV2NAA POSITIVE (A) 10/10/2018     Hepatic Function Latest Ref Rng & Units 10/28/2018 10/27/2018 10/26/2018  Total Protein 6.5 - 8.1 g/dL 7.2 7.0 6.3(L)  Albumin 3.5 - 5.0 g/dL 3.2(L) 3.1(L) 2.9(L)  AST 15 - 41 U/L 14(L) 15 14(L)  ALT 0 - 44 U/L _0 Alk Phosphatase 38 - 126 U/L 86 91 82  Total Bilirubin 0.3 - 1.2 mg/dL 0.7 0.5 0.5        Component Value Date/Time   BNP 73.8 10/28/2018 0548     2.  AKI.  Resolved after hydration.  3.  Essential hypertension.  Blood pressure stable not on any medications currently.  4.  DM depression.  On home dose Zoloft along with low-dose Xanax and stable.  5.  Morbid obesity BMI 41.  Follow with PCP for weight loss and diet control.   6.  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  Outpatient echocardiogram.  Trial of Lasix, continue beta-blocker and Nitropaste and monitor.  Avoid proning.  7.  Mild sinus tachycardia with hypotension.  Stable TSH, resolved.  Lab Results  Component Value Date   TSH 2.280 10/24/2018    8. DM type II in poor outpatient control due to hyperglycemia.  A1c 8.5.  She was not on any medications at home.  Currently on insulin.  CBG stable.  Will give her DM and insulin education.  CBG (last 3)  Recent Labs    10/27/18 1612 10/27/18 2214 10/28/18 0812  GLUCAP 212*  229* 149*    Condition - Extremely Guarded  Family Communication  :  daughter over the phone 10/24/18, 10/25/2018, 10/26/18  Code Status : No intubation no CPR.  Diet :   Diet Order            DIET DYS 3 Room service appropriate? Yes; Fluid consistency: Thin  Diet effective now               Disposition Plan  :  PCU  Consults  :  None  Procedures  :  None  PUD Prophylaxis :   DVT Prophylaxis  :  Lovenox   Lab Results  Component Value Date   PLT 232 10/27/2018    Inpatient Medications  Scheduled Meds: . sodium chloride   Intravenous Once  . ALPRAZolam  0.25 mg Oral Daily  . enoxaparin (LOVENOX) injection  0.5 mg/kg Subcutaneous Q12H  . feeding supplement (GLUCERNA SHAKE)  237 mL Oral BID BM  . insulin aspart  0-20 Units Subcutaneous TID WC  . insulin aspart  0-5 Units Subcutaneous QHS  . insulin aspart  4 Units Subcutaneous TID WC  . insulin detemir  25 Units Subcutaneous QHS  . methylPREDNISolone (SOLU-MEDROL) injection  60 mg Intravenous Daily  . nitroGLYCERIN  0.5 inch Topical Q6H  . potassium chloride  40 mEq Oral Once  . [START ON 10/29/2018] Ensure Max Protein  11 oz Oral Daily  . sertraline  50 mg Oral Daily  . sodium chloride flush  10-40 mL Intracatheter Q12H   Continuous Infusions:  PRN Meds:.acetaminophen, albuterol, ALPRAZolam, lip balm, menthol-cetylpyridinium, morphine injection, [DISCONTINUED] ondansetron **OR** ondansetron (ZOFRAN) IV, polyethylene glycol, sodium chloride  Antibiotics  :    Anti-infectives (From admission, onward)   Start     Dose/Rate Route Frequency Ordered Stop   10/12/18 0900  remdesivir 100 mg in sodium chloride 0.9 % 230 mL IVPB     100 mg 500 mL/hr over 30 Minutes Intravenous Every 24 hours 10/11/18 0745 10/15/18 0928   10/11/18 0900  remdesivir 200 mg in sodium chloride 0.9 % 210 mL IVPB     200 mg 500 mL/hr over 30 Minutes Intravenous Once 10/11/18 0745 10/11/18 1617       Time Spent in minutes  30    Lala Lund M.D on 10/28/2018 at 10:28 AM  To page go to [email or web address] - password [facility]  [facility] -  Office  [phone]   See all Orders from today for further details    Objective:   Vitals:   10/27/18 2350 10/28/18 0355 10/28/18 0407 10/28/18 0758  BP: 131/77   132/78  Pulse: 68   65  Resp: 19   (!) 30  Temp:   97.6 F (36.4 C) 98.4 F (36.9 C)  TempSrc:   Axillary Axillary  SpO2: (!) 87% (!) 88%    Weight:      Height:        Wt Readings from Last 3 Encounters:  10/10/18 108.7 kg  10/10/18 109 kg     Intake/Output Summary (Last 24 hours) at 10/28/2018 1028 Last data filed at 10/28/2018 0442 Gross per 24 hour  Intake -  Output 300 ml  Net -300 ml     Physical Exam  Awake Alert, No new F.N deficits, Normal affect [place].AT,PERRAL Supple Neck,No JVD, No cervical lymphadenopathy appriciated.  Symmetrical Chest wall movement, Good air movement bilaterally, CTAB RRR,No Gallops, Rubs or new Murmurs, No Parasternal Heave +ve B.Sounds, Abd Soft, No tenderness, No organomegaly appriciated, No rebound - guarding or rigidity. No Cyanosis, Clubbing or edema, No new Rash or bruise    Data Review:    CBC Recent Labs  Lab 10/22/18 0115 10/24/18 0323 10/25/18 0328 10/26/18 0300 10/27/18 0550  WBC 13.8* 25.3* 22.9* 18.5* 18.4*  HGB 13.3 13.0 13.5 12.7 13.3  HCT 40.4 39.7 41.9 40.0 41.9  PLT 331 300 276 254 232  MCV 93.5 95.9 96.5 95.7 96.3  MCH 30.8 31.4 31.1 30.4 30.6  MCHC 32.9 32.7 32.2 31.8 31.7  RDW 14.6 15.8* 16.2* 17.1* 17.3*  LYMPHSABS  --   --  1.4 1.1 0.9  MONOABS  --   --  0.9 0.8 1.0  EOSABS  --   --  0.4 0.3 0.0  BASOSABS  --   --  0.1 0.1 0.0    Chemistries  Recent Labs  Lab 10/22/18 0115  10/24/18 0323 10/25/18 0328 10/26/18 0300 10/27/18 0550 10/28/18 0548  NA 136   < > 137 139 141 143 139  K 3.2*   < > 3.8 3.8 3.8 3.7 3.5  CL 100   < > 102 102 102 101 94*  CO2 24   < > _0 GLUCOSE 149*   < > 148* 96 121*  119* 146*  BUN 39*   < > 31* 27* 27* 40* 47*  CREATININE 1.00   < > 0.99 0.86 1.03* 0.97 0.99  CALCIUM 8.5*   < > 8.4* 8.7* 8.3* 8.7* 8.8*  MG  --   --   --  2.7* 2.3 2.7* 2.7*  AST 18  --   --  15 14* 15  14*  ALT 47*  --   --  32 _0 ALKPHOS 47  --   --  87 82 91 86  BILITOT 0.8  --   --  0.3 0.5 0.5 0.7   < > = values in this interval not displayed.   ------------------------------------------------------------------------------------------------------------------ No results for input(s): CHOL, HDL, LDLCALC, TRIG, CHOLHDL, LDLDIRECT in the last 72 hours.  Lab Results  Component Value Date   HGBA1C 8.1 (H) 10/11/2018   ------------------------------------------------------------------------------------------------------------------ No results for input(s): TSH, T4TOTAL, T3FREE, THYROIDAB in the last 72 hours.  Invalid input(s): FREET3  Cardiac Enzymes No results for input(s): CKMB, TROPONINI, MYOGLOBIN in the last 168 hours.  Invalid input(s): CK ------------------------------------------------------------------------------------------------------------------    Component Value Date/Time   BNP 73.8 10/28/2018 0548    Micro Results Recent Results (from the past 240 hour(s))  Culture, Urine     Status: Abnormal   Collection Time: 10/24/18  7:21 AM   Specimen: Urine, Clean Catch  Result Value Ref Range Status   Specimen Description   Final    URINE, CLEAN CATCH Performed at [facility], [place] [address], [place], [place] [address]    Special Requests   Final    NONE Performed at [facility], [place] [address] [place], [place] [address]    Culture (A)  Final    <10,000 COLONIES/mL INSIGNIFICANT GROWTH Performed at [facility] [address] [place], [place] [address]    Report Status 10/25/2018 FINAL  Final    Radiology Reports Dg Chest Port 1 View  Result Date: 10/26/2018 CLINICAL DATA:  Acute respiratory  disease.  Hypoxia. EXAM: PORTABLE CHEST 1 VIEW COMPARISON:  10/24/2018 FINDINGS: Diffuse bilateral interstitial and alveolar airspace opacities. No significant interval change compared with the prior exam. No pleural effusion or pneumothorax. Stable cardiomediastinal silhouette. No aggressive osseous lesion. IMPRESSION: Stable diffuse bilateral interstitial and alveolar airspace disease most consistent with multilobar pneumonia including atypical pneumonia. Electronically Signed   By: Kathreen Devoid   On: 10/26/2018 09:39   Dg Chest Port 1 View  Result Date: 10/24/2018 CLINICAL DATA:  Order for SOB  Hx of HTN EXAM: PORTABLE CHEST - 1 VIEW COMPARISON:  10/22/2018 FINDINGS: Progression of bilateral diffuse coarse airspace opacities with relative sparing of the apices. Heart size and mediastinal contours are within normal limits. Aortic Atherosclerosis (ICD10-170.0). No effusion. Visualized bones unremarkable. IMPRESSION: Worsening bilateral infiltrates or edema. Electronically Signed   By: Lucrezia Europe M.D.   On: 10/24/2018 08:55   Dg Chest Port 1 View  Result Date: 10/22/2018 CLINICAL DATA:  Pneumonia. EXAM: PORTABLE CHEST 1 VIEW COMPARISON:  Radiograph of October 10, 2018. FINDINGS: The heart size and mediastinal contours are within normal limits. No pneumothorax or pleural effusion is noted. Slightly improved bilateral lung opacities are noted consistent with multifocal pneumonia. The visualized skeletal structures are unremarkable. IMPRESSION: Slightly improved multifocal pneumonia. Electronically Signed   By: Marijo Conception M.D.   On: 10/22/2018 11:32   Dg Chest Port 1 View  Result Date: 10/10/2018 CLINICAL DATA:  Fever and shortness of breath EXAM: PORTABLE CHEST 1 VIEW COMPARISON:  None. FINDINGS: There are bilateral streaky airspace opacities, worst in the right upper lobe. Cardiomediastinal contours are normal. No pneumothorax or sizable pleural effusion. IMPRESSION: Bilateral streaky airspace opacities  which may indicate multifocal infection. Electronically Signed   By: Ulyses Jarred M.D.   On: 10/10/2018 17:30

## 2018-10-28 NOTE — Progress Notes (Signed)
At beginning of shift spoke with patients daughter Maire and gave her a clinical update on patients current plan of care. Doreen voices that she wishes to be called for all interpreter needs for her mother regardless of time. She is thankful for care of parent.

## 2018-10-29 LAB — CBC WITH DIFFERENTIAL/PLATELET
Abs Immature Granulocytes: 0.08 10*3/uL — ABNORMAL HIGH (ref 0.00–0.07)
Basophils Absolute: 0 10*3/uL (ref 0.0–0.1)
Basophils Relative: 0 %
Eosinophils Absolute: 0 10*3/uL (ref 0.0–0.5)
Eosinophils Relative: 0 %
HCT: 43.5 % (ref 36.0–46.0)
Hemoglobin: 13.5 g/dL (ref 12.0–15.0)
Immature Granulocytes: 1 %
Lymphocytes Relative: 5 %
Lymphs Abs: 0.8 10*3/uL (ref 0.7–4.0)
MCH: 30.6 pg (ref 26.0–34.0)
MCHC: 31 g/dL (ref 30.0–36.0)
MCV: 98.6 fL (ref 80.0–100.0)
Monocytes Absolute: 0.8 10*3/uL (ref 0.1–1.0)
Monocytes Relative: 5 %
Neutro Abs: 15 10*3/uL — ABNORMAL HIGH (ref 1.7–7.7)
Neutrophils Relative %: 89 %
Platelets: 223 10*3/uL (ref 150–400)
RBC: 4.41 MIL/uL (ref 3.87–5.11)
RDW: 17.3 % — ABNORMAL HIGH (ref 11.5–15.5)
WBC: 16.8 10*3/uL — ABNORMAL HIGH (ref 4.0–10.5)
nRBC: 0 % (ref 0.0–0.2)

## 2018-10-29 LAB — MAGNESIUM: Magnesium: 2.9 mg/dL — ABNORMAL HIGH (ref 1.7–2.4)

## 2018-10-29 LAB — LACTATE DEHYDROGENASE: LDH: 432 U/L — ABNORMAL HIGH (ref 98–192)

## 2018-10-29 LAB — COMPREHENSIVE METABOLIC PANEL
ALT: 24 U/L (ref 0–44)
AST: 12 U/L — ABNORMAL LOW (ref 15–41)
Albumin: 3.2 g/dL — ABNORMAL LOW (ref 3.5–5.0)
Alkaline Phosphatase: 72 U/L (ref 38–126)
Anion gap: 15 (ref 5–15)
BUN: 48 mg/dL — ABNORMAL HIGH (ref 8–23)
CO2: 30 mmol/L (ref 22–32)
Calcium: 8.9 mg/dL (ref 8.9–10.3)
Chloride: 95 mmol/L — ABNORMAL LOW (ref 98–111)
Creatinine, Ser: 0.92 mg/dL (ref 0.44–1.00)
GFR calc Af Amer: >60 mL/min (ref >60)
GFR calc non Af Amer: >60 mL/min (ref >60)
Glucose, Bld: 91 mg/dL (ref 70–99)
Potassium: 4.2 mmol/L (ref 3.5–5.1)
Sodium: 140 mmol/L (ref 135–145)
Total Bilirubin: 0.8 mg/dL (ref 0.3–1.2)
Total Protein: 7 g/dL (ref 6.5–8.1)

## 2018-10-29 LAB — C-REACTIVE PROTEIN: CRP: 2.6 mg/dL — ABNORMAL HIGH (ref <1.0)

## 2018-10-29 LAB — D-DIMER, QUANTITATIVE: D-Dimer, Quant: 1.89 ug/mL-FEU — ABNORMAL HIGH (ref 0.00–0.50)

## 2018-10-29 LAB — GLUCOSE, CAPILLARY
Glucose-Capillary: 183 mg/dL — ABNORMAL HIGH (ref 70–99)
Glucose-Capillary: 161 mg/dL — ABNORMAL HIGH (ref 70–99)
Glucose-Capillary: 155 mg/dL — ABNORMAL HIGH (ref 70–99)
Glucose-Capillary: 157 mg/dL — ABNORMAL HIGH (ref 70–99)
Glucose-Capillary: 211 mg/dL — ABNORMAL HIGH (ref 70–99)

## 2018-10-29 LAB — BRAIN NATRIURETIC PEPTIDE: B Natriuretic Peptide: 58.8 pg/mL (ref 0.0–100.0)

## 2018-10-29 LAB — FERRITIN: Ferritin: 226 ng/mL (ref 11–307)

## 2018-10-29 MED ORDER — FUROSEMIDE 10 MG/ML IJ SOLN
40.0000 mg | Freq: Once | INTRAMUSCULAR | Status: AC
  Administered 2018-10-29: 40 mg via INTRAVENOUS
  Filled 2018-10-29: qty 4

## 2018-10-29 MED ORDER — CLONAZEPAM 0.5 MG PO TBDP
0.5000 mg | ORAL_TABLET | Freq: Two times a day (BID) | ORAL | Status: DC
  Administered 2018-10-29 – 2018-11-18 (×41): 0.5 mg via ORAL
  Filled 2018-10-29 (×41): qty 1

## 2018-10-29 NOTE — Progress Notes (Signed)
PT Cancellation Note  Patient Details Name: Christina Clark MRN: [id number] DOB: [birth year]   Cancelled Treatment:    Reason Eval/Treat Not Completed: Medical issues which prohibited therapy , note O2 saturation in 70-low 80%'s on Hight flow .   [name] 10/29/2018, 1:58 PM  [place] Pager [phone] Office [phone]

## 2018-10-29 NOTE — Progress Notes (Signed)
Initial Nutrition Assessment RD working remotely.  DOCUMENTATION CODES:   Morbid obesity  INTERVENTION:    Continue Ensure Max po BID, each supplement provides 150 kcal and 30 grams of protein.    Pt receiving Hormel Shake daily with Breakfast which provides 520 kcals and 22 g of protein and Magic cup BID with lunch and dinner, each supplement provides 290 kcal and 9 grams of protein, automatically on meal trays to optimize nutritional intake.   NUTRITION DIAGNOSIS:   Increased nutrient needs related to acute illness as evidenced by estimated needs.  GOAL:   Patient will meet greater than or equal to 90% of their needs  MONITOR:   PO intake, Supplement acceptance, Labs  REASON FOR ASSESSMENT:   Consult Assessment of nutrition requirement/status  ASSESSMENT:   68 yo Spanish speaking female admitted with COVID PNA. PMH of morbid obesity, HTN, depression.   Patient has a guarded prognosis. Family does not want CPR or intubation.    Unable to speak with patient over the phone. She has been consuming on average ~50% of meals over the past week. She is also consuming Ensure Max BID. 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   Labs reviewed.  CBG's: 135-161  Medications reviewed and include novolog, levemir, KCl.   NUTRITION - FOCUSED PHYSICAL EXAM:  unable to complete  Diet Order:   Diet Order            DIET DYS 3 Room service appropriate? Yes; Fluid consistency: Thin  Diet effective now              EDUCATION NEEDS:   Not appropriate for education at this time  Skin:  Skin Assessment: Reviewed RN Assessment  Last BM:  6/28  Height:   Ht Readings from Last 1 Encounters:  10/10/18 5\' 4"  (1.626 m)    Weight:   Wt Readings from Last 1 Encounters:  10/10/18 108.7 kg    Ideal Body Weight:  54.5 kg  BMI:  Body mass index is 41.13 kg/m.  Estimated Nutritional Needs:   Kcal:  8242-3536  Protein:  100-125 gm  Fluid:  1.8  L    Molli Barrows, RD, LDN, CNSC Pager [phone] After Hours Pager [phone]

## 2018-10-29 NOTE — Progress Notes (Signed)
Pt's daughter interpreting for me and the patient. Very helpful. Patient has no questions at this time. Doing incentive spirometer.

## 2018-10-29 NOTE — Progress Notes (Signed)
PROGRESS NOTE                                                                                                                                                                                                             Patient Demographics:    Christina Clark, is a 68 y.o. female, DOB - [birth year], IOM:[id number]  Outpatient Primary MD for the patient is Patient, No Pcp Per    LOS - 19  Admit date - 10/10/2018    No chief complaint on file.      Brief Narrative  68 year old Spanish-speaking female with past medical history of morbid obesity, hypertension and depression admitted to the hospitalist service on [place] for COVID pneumonia related acute hypoxic respiratory failure. She was started on Remdisivir, steroids and given Actemra x1.   Subjective:   Patient in bed, appears comfortable, denies any headache, no fever, no chest pain or pressure, no shortness of breath , no abdominal pain. No focal weakness.    Assessment  & Plan :     Acute Hypoxic Resp. Failure due to Acute Covid 19 Viral Pneumonitis during the ongoing 2020 Covid 19 Pandemic - she has already received and has completed her cynical course of Remdisivir, steroids, Plasma and Actemra, only mild improvement so far still tenuous and requiring 15 to 20 L high flow nasal cannula oxygen along with nonrebreather mask, some element of orthopnea as well.  Continue to advance activity, added flutter valve and I-S for pulmonary toiletry encouraged to sit up in chair and daytime.  Inflammatory markers are borderline.  Unfortunately patient continues to be tenuous and hypoxic.  Note since that she has developed a touch of CHF proning will not help her, advised nursing staff to keep the head of the bed propped up at least 90 degrees if she is in bed or preferably sit in chair in the daytime.  Detailed discussion with patient and family including her daughter  and son-in-law were made on 10/25/2018 and 10/26/2018.  They do not want intubation but wants full medical treatment short of that.  This will be continued, they understand that there is a good chance patient might pass away this admission.  If she declines further goal of care will be comfort.  Short trial of IV steroids again as she continues to be tenuous and no  further treatment left along with Lasix for ongoing diuresis.Marland Kitchen  COVID-19 Labs  Recent Labs    10/27/18 0550 10/28/18 0548 10/29/18 0208  DDIMER 3.14* 2.12* 1.89*  FERRITIN 199 223 226  LDH 543* 509* 432*  CRP 5.3* 3.2* 2.6*    Lab Results  Component Value Date   SARSCOV2NAA POSITIVE (A) 10/10/2018     Hepatic Function Latest Ref Rng & Units 10/29/2018 10/28/2018 10/27/2018  Total Protein 6.5 - 8.1 g/dL 7.0 7.2 7.0  Albumin 3.5 - 5.0 g/dL 3.2(L) 3.2(L) 3.1(L)  AST 15 - 41 U/L 12(L) 14(L) 15  ALT 0 - 44 U/L '24 24 27  '$ Alk Phosphatase 38 - 126 U/L 72 86 91  Total Bilirubin 0.3 - 1.2 mg/dL 0.8 0.7 0.5        Component Value Date/Time   BNP 58.8 10/29/2018 0208     2.  AKI.  Resolved after hydration.  3.  Essential hypertension.  Blood pressure stable not on any medications currently.  4.  DM depression.  On home dose Zoloft along with low-dose Xanax and stable.  5.  Morbid obesity BMI 41.  Follow with PCP for weight loss and diet control.   6.  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  Outpatient echocardiogram.  Trial of Lasix, continue beta-blocker and Nitropaste and monitor.  Avoid proning.  7.  Mild sinus tachycardia with hypotension.  Stable TSH, resolved.  Lab Results  Component Value Date   TSH 2.280 10/24/2018    8. DM type II in poor outpatient control due to hyperglycemia.  A1c 8.5.  She was not on any medications at home.  Currently on insulin.  CBG stable.  Will give her DM and insulin education.  CBG (last 3)  Recent Labs    10/28/18 1622 10/28/18 2043 10/29/18 0818  GLUCAP 238* 135* 161*     Condition - Extremely Guarded  Family Communication  :  daughter over the phone 10/24/18, 10/25/2018, 10/26/18, 10/27/18, 10/29/18  Code Status : No intubation no CPR.  Diet :   Diet Order            DIET DYS 3 Room service appropriate? Yes; Fluid consistency: Thin  Diet effective now               Disposition Plan  : ICU as floor nurses not comfortable with frequent monitoring of patient being on nonrebreather along with high flow nasal cannula oxygen.  Consults  :  None  Procedures  :  None  PUD Prophylaxis :   DVT Prophylaxis  :  Lovenox   Lab Results  Component Value Date   PLT 223 10/29/2018    Inpatient Medications  Scheduled Meds: . sodium chloride   Intravenous Once  . ALPRAZolam  0.25 mg Oral Daily  . enoxaparin (LOVENOX) injection  0.5 mg/kg Subcutaneous Q12H  . feeding supplement (GLUCERNA SHAKE)  237 mL Oral BID BM  . insulin aspart  0-20 Units Subcutaneous TID WC  . insulin aspart  0-5 Units Subcutaneous QHS  . insulin aspart  4 Units Subcutaneous TID WC  . insulin detemir  25 Units Subcutaneous QHS  . methylPREDNISolone (SOLU-MEDROL) injection  60 mg Intravenous Daily  . nitroGLYCERIN  0.5 inch Topical Q6H  . potassium chloride  40 mEq Oral Once  . Ensure Max Protein  11 oz Oral Daily  . sertraline  50 mg Oral Daily  . sodium chloride flush  10-40 mL Intracatheter Q12H   Continuous Infusions:  PRN  Meds:.acetaminophen, albuterol, ALPRAZolam, lip balm, menthol-cetylpyridinium, morphine injection, [DISCONTINUED] ondansetron **OR** ondansetron (ZOFRAN) IV, polyethylene glycol, sodium chloride  Antibiotics  :    Anti-infectives (From admission, onward)   Start     Dose/Rate Route Frequency Ordered Stop   10/12/18 0900  remdesivir 100 mg in sodium chloride 0.9 % 230 mL IVPB     100 mg 500 mL/hr over 30 Minutes Intravenous Every 24 hours 10/11/18 0745 10/15/18 0928   10/11/18 0900  remdesivir 200 mg in sodium chloride 0.9 % 210 mL IVPB     200 mg  500 mL/hr over 30 Minutes Intravenous Once 10/11/18 0745 10/11/18 1617       Time Spent in minutes  30   Lala Lund M.D on 10/29/2018 at 10:10 AM  To page go to [email or web address] - password [email or web address]  [facility] -  Office  [phone]   See all Orders from today for further details    Objective:   Vitals:   10/29/18 0410 10/29/18 0411 10/29/18 0820 10/29/18 0855  BP: (!) 106/51 (!) 106/51 101/66 101/66  Pulse: 86 94  94  Resp: (!) 21 18  (!) 28  Temp:  97.6 F (36.4 C) 98 F (36.7 C)   TempSrc:  Axillary Axillary   SpO2: 91% (!) 85%  (!) 86%  Weight:      Height:        Wt Readings from Last 3 Encounters:  10/10/18 108.7 kg  10/10/18 109 kg     Intake/Output Summary (Last 24 hours) at 10/29/2018 1010 Last data filed at 10/29/2018 0650 Gross per 24 hour  Intake 310 ml  Output 200 ml  Net 110 ml     Physical Exam  Awake Alert,  No new F.N deficits, mildly anxious affect [place].AT,PERRAL Supple Neck,No JVD, No cervical lymphadenopathy appriciated.  Symmetrical Chest wall movement, Good air movement bilaterally, few rales RRR,No Gallops, Rubs or new Murmurs, No Parasternal Heave +ve B.Sounds, Abd Soft, No tenderness, No organomegaly appriciated, No rebound - guarding or rigidity. No Cyanosis, Clubbing or edema, No new Rash or bruise     Data Review:    CBC Recent Labs  Lab 10/25/18 0328 10/26/18 0300 10/27/18 0550 10/28/18 0548 10/29/18 0208  WBC 22.9* 18.5* 18.4* 19.2* 16.8*  HGB 13.5 12.7 13.3 13.5 13.5  HCT 41.9 40.0 41.9 42.7 43.5  PLT 276 254 232 223 223  MCV 96.5 95.7 96.3 97.5 98.6  MCH 31.1 30.4 30.6 30.8 30.6  MCHC 32.2 31.8 31.7 31.6 31.0  RDW 16.2* 17.1* 17.3* 17.4* 17.3*  LYMPHSABS 1.4 1.1 0.9 0.8 0.8  MONOABS 0.9 0.8 1.0 1.0 0.8  EOSABS 0.4 0.3 0.0 0.2 0.0  BASOSABS 0.1 0.1 0.0 0.0 0.0    Chemistries  Recent Labs  Lab 10/25/18 0328 10/26/18 0300 10/27/18 0550 10/28/18 0548 10/29/18 0208  NA 139 141 143 139 140  K 3.8  3.8 3.7 3.5 4.2  CL 102 102 101 94* 95*  CO2 '27 26 29 31 30  '$ GLUCOSE 96 121* 119* 146* 91  BUN 27* 27* 40* 47* 48*  CREATININE 0.86 1.03* 0.97 0.99 0.92  CALCIUM 8.7* 8.3* 8.7* 8.8* 8.9  MG 2.7* 2.3 2.7* 2.7* 2.9*  AST 15 14* 15 14* 12*  ALT 32 '28 27 24 24  '$ ALKPHOS 87 82 91 86 72  BILITOT 0.3 0.5 0.5 0.7 0.8   ------------------------------------------------------------------------------------------------------------------ No results for input(s): CHOL, HDL, LDLCALC, TRIG, CHOLHDL, LDLDIRECT in the last 72 hours.  Lab Results  Component Value  Date   HGBA1C 8.1 (H) 10/11/2018   ------------------------------------------------------------------------------------------------------------------ No results for input(s): TSH, T4TOTAL, T3FREE, THYROIDAB in the last 72 hours.  Invalid input(s): FREET3  Cardiac Enzymes No results for input(s): CKMB, TROPONINI, MYOGLOBIN in the last 168 hours.  Invalid input(s): CK ------------------------------------------------------------------------------------------------------------------    Component Value Date/Time   BNP 58.8 10/29/2018 0208    Micro Results Recent Results (from the past 240 hour(s))  Culture, Urine     Status: Abnormal   Collection Time: 10/24/18  7:21 AM   Specimen: Urine, Clean Catch  Result Value Ref Range Status   Specimen Description   Final    URINE, CLEAN CATCH Performed at [facility], [place] [address] [place], [place] [address]    Special Requests   Final    NONE Performed at [facility], [place] [address] [place], [place] [address]    Culture (A)  Final    <10,000 COLONIES/mL INSIGNIFICANT GROWTH Performed at [place] [address] [place], [place] [address]    Report Status 10/25/2018 FINAL  Final    Radiology Reports Dg Chest Port 1 View  Result Date: 10/26/2018 CLINICAL DATA:  Acute respiratory disease.  Hypoxia. EXAM: PORTABLE CHEST 1 VIEW  COMPARISON:  10/24/2018 FINDINGS: Diffuse bilateral interstitial and alveolar airspace opacities. No significant interval change compared with the prior exam. No pleural effusion or pneumothorax. Stable cardiomediastinal silhouette. No aggressive osseous lesion. IMPRESSION: Stable diffuse bilateral interstitial and alveolar airspace disease most consistent with multilobar pneumonia including atypical pneumonia. Electronically Signed   By: Kathreen Devoid   On: 10/26/2018 09:39   Dg Chest Port 1 View  Result Date: 10/24/2018 CLINICAL DATA:  Order for SOB  Hx of HTN EXAM: PORTABLE CHEST - 1 VIEW COMPARISON:  10/22/2018 FINDINGS: Progression of bilateral diffuse coarse airspace opacities with relative sparing of the apices. Heart size and mediastinal contours are within normal limits. Aortic Atherosclerosis (ICD10-170.0). No effusion. Visualized bones unremarkable. IMPRESSION: Worsening bilateral infiltrates or edema. Electronically Signed   By: Lucrezia Europe M.D.   On: 10/24/2018 08:55   Dg Chest Port 1 View  Result Date: 10/22/2018 CLINICAL DATA:  Pneumonia. EXAM: PORTABLE CHEST 1 VIEW COMPARISON:  Radiograph of October 10, 2018. FINDINGS: The heart size and mediastinal contours are within normal limits. No pneumothorax or pleural effusion is noted. Slightly improved bilateral lung opacities are noted consistent with multifocal pneumonia. The visualized skeletal structures are unremarkable. IMPRESSION: Slightly improved multifocal pneumonia. Electronically Signed   By: Marijo Conception M.D.   On: 10/22/2018 11:32   Dg Chest Port 1 View  Result Date: 10/10/2018 CLINICAL DATA:  Fever and shortness of breath EXAM: PORTABLE CHEST 1 VIEW COMPARISON:  None. FINDINGS: There are bilateral streaky airspace opacities, worst in the right upper lobe. Cardiomediastinal contours are normal. No pneumothorax or sizable pleural effusion. IMPRESSION: Bilateral streaky airspace opacities which may indicate multifocal infection.  Electronically Signed   By: Ulyses Jarred M.D.   On: 10/10/2018 17:30

## 2018-10-29 NOTE — Progress Notes (Signed)
Occupational Therapy Treatment Patient Details Name: Christina Clark MRN: [id number] DOB: 20-Dec-1950 Today's Date: 10/29/2018    History of present illness 68 year old Spanish-speaking female with past medical history of morbid obesity, hypertension and depression  presenting to the emergency room 10/10/18 with 1 week of fever, cough and shortness of breath in the setting of known COVID-19 contacts.  Patient found to be COVID positive as well as hypoxic with bilateral infiltrates on chest x-ray.  Requiring oxygen support of high flow   OT comments  Pt seen to assist nsg with linen change/self-care due to incontinent episode. Pt able to assist with self care as she was able to tolerate given respiratory status. Pt wanting to move to chair, however, SpO2 70s - 80s on 50L HFNC therefore Pt left in "chair position" in bed. Pt very appreciative. Daughter interpreted during session. Encourage nsg staff to place pt in chair position as much as pt can tolerate.   Follow Up Recommendations  CIR;Supervision/Assistance - 24 hour    Equipment Recommendations  3 in 1 bedside commode    Recommendations for Other Services Rehab consult    Precautions / Restrictions Precautions Precautions: Fall Precaution Comments: monitor sats , VS on HFNC at15  L sats drop into 70s       Mobility Bed Mobility Overal bed mobility: Needs Assistance Bed Mobility: Rolling Rolling: Min assist         General bed mobility comments: once seated in chair position, pt able to pull forward to move back off bed  Transfers                 General transfer comment: unable to attempt due to O2 needs    Balance     Sitting balance-Leahy Scale: Good                                     ADL either performed or assessed with clinical judgement   ADL Overall ADL's : Needs assistance/impaired     Grooming: Minimal assistance;Bed level   Upper Body Bathing: Moderate assistance;Bed  level   Lower Body Bathing: Maximal assistance;Bed level                         General ADL Comments: Assisted nsg with cleaing pt after incontinenet episode/bedpan spill. Pt rolling side/side and assisting with self care as tolerated     Vision       Perception     Praxis      Cognition Arousal/Alertness: Awake/alert Behavior During Therapy: Anxious(but better) Overall Cognitive Status: Within Functional Limits for tasks assessed                                          Exercises     Shoulder Instructions       General Comments      Pertinent Vitals/ Pain       Pain Assessment: Faces Faces Pain Scale: Hurts a little bit Pain Location: back Pain Descriptors / Indicators: Discomfort Pain Intervention(s): Limited activity within patient's tolerance  Home Living  Prior Functioning/Environment              Frequency  Min 3X/week        Progress Toward Goals  OT Goals(current goals can now be found in the care plan section)  Progress towards OT goals: Progressing toward goals  Acute Rehab OT Goals Patient Stated Goal: to breath better OT Goal Formulation: With patient/family Time For Goal Achievement: 11/04/18 Potential to Achieve Goals: Good ADL Goals Pt Will Perform Upper Body Bathing: with modified independence;sitting Pt Will Perform Lower Body Bathing: with modified independence;sit to/from stand;with adaptive equipment Pt Will Perform Lower Body Dressing: with modified independence;sit to/from stand;with adaptive equipment Pt Will Transfer to Toilet: with modified independence;ambulating;bedside commode Pt Will Perform Toileting - Clothing Manipulation and hygiene: with modified independence;sit to/from stand Pt/caregiver will Perform Home Exercise Program: Increased strength;With theraband;Both right and left upper extremity;With Supervision;With written HEP  provided Additional ADL Goal #1: Pt will independently verbalize 3 energy conservation strategies for ADL  Plan Discharge plan remains appropriate    Co-evaluation                 AM-PAC OT "6 Clicks" Daily Activity     Outcome Measure   Help from another person eating meals?: None Help from another person taking care of personal grooming?: A Little Help from another person toileting, which includes using toliet, bedpan, or urinal?: A Lot Help from another person bathing (including washing, rinsing, drying)?: A Lot Help from another person to put on and taking off regular upper body clothing?: A Lot Help from another person to put on and taking off regular lower body clothing?: A Lot 6 Click Score: 15    End of Session Equipment Utilized During Treatment: Oxygen(50L 100%)  OT Visit Diagnosis: Unsteadiness on feet (R26.81);Muscle weakness (generalized) (M62.81)   Activity Tolerance Other (comment)(limited by respiratory status)   Patient Left in bed;Other (comment)(in chair position)   Nurse Communication Mobility status;Other (comment)(use of chair position)        Time: 1533-1600 OT Time Calculation (min): 27 min  Charges: OT General Charges $OT Visit: 1 Visit OT Treatments $Self Care/Home Management : 23-37 mins  Maurie Boettcher, OT/L   Acute OT Clinical Specialist [place] Pager [phone] Office [phone]    [facility] 10/29/2018, 4:16 PM

## 2018-10-30 LAB — GLUCOSE, CAPILLARY
Glucose-Capillary: 114 mg/dL — ABNORMAL HIGH (ref 70–99)
Glucose-Capillary: 314 mg/dL — ABNORMAL HIGH (ref 70–99)
Glucose-Capillary: 253 mg/dL — ABNORMAL HIGH (ref 70–99)
Glucose-Capillary: 130 mg/dL — ABNORMAL HIGH (ref 70–99)

## 2018-10-30 LAB — COMPREHENSIVE METABOLIC PANEL
ALT: 29 U/L (ref 0–44)
AST: 13 U/L — ABNORMAL LOW (ref 15–41)
Albumin: 3.2 g/dL — ABNORMAL LOW (ref 3.5–5.0)
Alkaline Phosphatase: 68 U/L (ref 38–126)
Anion gap: 14 (ref 5–15)
BUN: 52 mg/dL — ABNORMAL HIGH (ref 8–23)
CO2: 31 mmol/L (ref 22–32)
Calcium: 9.2 mg/dL (ref 8.9–10.3)
Chloride: 93 mmol/L — ABNORMAL LOW (ref 98–111)
Creatinine, Ser: 0.94 mg/dL (ref 0.44–1.00)
GFR calc Af Amer: >60 mL/min (ref >60)
GFR calc non Af Amer: >60 mL/min (ref >60)
Glucose, Bld: 88 mg/dL (ref 70–99)
Potassium: 3.4 mmol/L — ABNORMAL LOW (ref 3.5–5.1)
Sodium: 138 mmol/L (ref 135–145)
Total Bilirubin: 0.7 mg/dL (ref 0.3–1.2)
Total Protein: 7.2 g/dL (ref 6.5–8.1)

## 2018-10-30 LAB — CBC WITH DIFFERENTIAL/PLATELET
Abs Immature Granulocytes: 0.1 10*3/uL — ABNORMAL HIGH (ref 0.00–0.07)
Basophils Absolute: 0 10*3/uL (ref 0.0–0.1)
Basophils Relative: 0 %
Eosinophils Absolute: 0.2 10*3/uL (ref 0.0–0.5)
Eosinophils Relative: 1 %
HCT: 44.9 % (ref 36.0–46.0)
Hemoglobin: 14.1 g/dL (ref 12.0–15.0)
Immature Granulocytes: 1 %
Lymphocytes Relative: 11 %
Lymphs Abs: 1.9 10*3/uL (ref 0.7–4.0)
MCH: 30.2 pg (ref 26.0–34.0)
MCHC: 31.4 g/dL (ref 30.0–36.0)
MCV: 96.1 fL (ref 80.0–100.0)
Monocytes Absolute: 1.2 10*3/uL — ABNORMAL HIGH (ref 0.1–1.0)
Monocytes Relative: 7 %
Neutro Abs: 13.5 10*3/uL — ABNORMAL HIGH (ref 1.7–7.7)
Neutrophils Relative %: 80 %
Platelets: 214 10*3/uL (ref 150–400)
RBC: 4.67 MIL/uL (ref 3.87–5.11)
RDW: 17.1 % — ABNORMAL HIGH (ref 11.5–15.5)
WBC: 17 10*3/uL — ABNORMAL HIGH (ref 4.0–10.5)
nRBC: 0 % (ref 0.0–0.2)

## 2018-10-30 LAB — BRAIN NATRIURETIC PEPTIDE: B Natriuretic Peptide: 46.2 pg/mL (ref 0.0–100.0)

## 2018-10-30 LAB — D-DIMER, QUANTITATIVE: D-Dimer, Quant: 1.89 ug/mL-FEU — ABNORMAL HIGH (ref 0.00–0.50)

## 2018-10-30 LAB — MAGNESIUM: Magnesium: 2.8 mg/dL — ABNORMAL HIGH (ref 1.7–2.4)

## 2018-10-30 LAB — C-REACTIVE PROTEIN: CRP: 1.9 mg/dL — ABNORMAL HIGH (ref <1.0)

## 2018-10-30 LAB — FERRITIN: Ferritin: 239 ng/mL (ref 11–307)

## 2018-10-30 MED ORDER — FUROSEMIDE 10 MG/ML IJ SOLN
40.0000 mg | Freq: Once | INTRAMUSCULAR | Status: AC
  Administered 2018-10-30: 40 mg via INTRAVENOUS
  Filled 2018-10-30: qty 4

## 2018-10-30 MED ORDER — POTASSIUM CHLORIDE CRYS ER 20 MEQ PO TBCR
40.0000 meq | EXTENDED_RELEASE_TABLET | Freq: Four times a day (QID) | ORAL | Status: AC
  Administered 2018-10-30 (×2): 40 meq via ORAL
  Filled 2018-10-30 (×2): qty 2

## 2018-10-30 NOTE — Progress Notes (Addendum)
PROGRESS NOTE                                                                                                                                                                                                             Patient Demographics:    Christina Clark, is a 68 y.o. female, DOB - 03/31/1951, ZYS:[id number]  Outpatient Primary MD for the patient is Patient, No Pcp Per    LOS - 20  Admit date - 10/10/2018    No chief complaint on file.      Brief Narrative  68 year old Spanish-speaking female with past medical history of morbid obesity, hypertension and depression admitted to the hospitalist service on [place] for COVID pneumonia related acute hypoxic respiratory failure. She was started on Remdisivir, steroids and given Actemra x1.   Subjective:   Patient in bed, appears comfortable, denies any headache, no fever, no chest pain or pressure,says improved shortness of breath , no abdominal pain. No focal weakness.   Assessment  & Plan :     Acute Hypoxic Resp. Failure due to Acute Covid 19 Viral Pneumonitis during the ongoing 2020 Covid 19 Pandemic - she has already received and has completed her cynical course of Remdisivir, steroids, Plasma and Actemra, only mild improvement so far still tenuous and requiring 15 to 20 L high flow nasal cannula oxygen along with nonrebreather mask, some element of orthopnea as well.  Continue to advance activity, added flutter valve and I-S for pulmonary toiletry encouraged to sit up in chair and daytime.  Inflammatory markers are borderline.  Unfortunately patient continues to be tenuous and hypoxic.  Note since that she has developed a touch of CHF proning will not help her, advised nursing staff to keep the head of the bed propped up at least 90 degrees if she is in bed or preferably sit in chair in the daytime.  Detailed discussion with patient and family including her  daughter and son-in-law were made on 10/25/2018 and 10/26/2018.  They do not want intubation but wants full medical treatment short of that.  This will be continued, they understand that there is a good chance patient might pass away this admission.  If she declines further goal of care will be comfort.  Short trial of IV steroids again as she continues to be tenuous and no further  treatment left along with Lasix for ongoing diuresis.Marland Kitchen  COVID-19 Labs  Recent Labs    10/28/18 0548 10/29/18 0208 10/30/18 0424  DDIMER 2.12* 1.89* 1.89*  FERRITIN 223 226 239  LDH 509* 432*  --   CRP 3.2* 2.6* 1.9*    Lab Results  Component Value Date   SARSCOV2NAA POSITIVE (A) 10/10/2018     Hepatic Function Latest Ref Rng & Units 10/30/2018 10/29/2018 10/28/2018  Total Protein 6.5 - 8.1 g/dL 7.2 7.0 7.2  Albumin 3.5 - 5.0 g/dL 3.2(L) 3.2(L) 3.2(L)  AST 15 - 41 U/L 13(L) 12(L) 14(L)  ALT 0 - 44 U/L _0 Alk Phosphatase 38 - 126 U/L 68 72 86  Total Bilirubin 0.3 - 1.2 mg/dL 0.7 0.8 0.7        Component Value Date/Time   BNP 46.2 10/30/2018 0424     2.  AKI.  Resolved after hydration.  3.  Essential hypertension.  Blood pressure stable not on any medications currently.  4.  DM depression.  On home dose Zoloft along with low-dose Xanax and stable.  5.  Morbid obesity BMI 41.  Follow with PCP for weight loss and diet control.   6.  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  Outpatient echocardiogram.  Continue IV Lasix as needed blood pressure too low for beta-blocker or Nitropaste which were tried earlier.  Avoid proning.  7.  Mild sinus tachycardia with hypotension.  Stable TSH, resolved.  Lab Results  Component Value Date   TSH 2.280 10/24/2018    8.  Hypokalemia.  Replace and monitor.    9.  Deconditioning.  Encouraged to sit in chair every day, daughter updated on 10/30/2018, patient willing to do, PT requested to assist the patient in doing so, it will be expected for her to get  slightly hypoxic from her baseline levels when she is moving.  This should not prohibit PT treatments unless patient gets profoundly symptomatic.    10. DM type II in poor outpatient control due to hyperglycemia.  A1c 8.5.  She was not on any medications at home.  Currently on insulin.  CBG stable.  Will give her DM and insulin education.  CBG (last 3)  Recent Labs    10/29/18 1632 10/29/18 2217 10/30/18 0756  GLUCAP 157* 211* 114*    Condition - Extremely Guarded  Family Communication  :  daughter over the phone 10/24/18, 10/25/2018, 10/26/18, 10/27/18, 10/29/18  Code Status : No intubation no CPR.  Diet :   Diet Order            DIET DYS 3 Room service appropriate? Yes; Fluid consistency: Thin  Diet effective now               Disposition Plan  : PCU.  Consults  :  None  Procedures  :  None  PUD Prophylaxis :   DVT Prophylaxis  :  Lovenox   Lab Results  Component Value Date   PLT 214 10/30/2018    Inpatient Medications  Scheduled Meds: . sodium chloride   Intravenous Once  . clonazepam  0.5 mg Oral BID  . enoxaparin (LOVENOX) injection  0.5 mg/kg Subcutaneous Q12H  . insulin aspart  0-20 Units Subcutaneous TID WC  . insulin aspart  0-5 Units Subcutaneous QHS  . insulin aspart  4 Units Subcutaneous TID WC  . insulin detemir  25 Units Subcutaneous QHS  . methylPREDNISolone (SOLU-MEDROL) injection  60 mg Intravenous Daily  . potassium  chloride  40 mEq Oral Q6H  . Ensure Max Protein  11 oz Oral Daily  . sertraline  50 mg Oral Daily  . sodium chloride flush  10-40 mL Intracatheter Q12H   Continuous Infusions:  PRN Meds:.acetaminophen, albuterol, ALPRAZolam, lip balm, menthol-cetylpyridinium, morphine injection, [DISCONTINUED] ondansetron **OR** ondansetron (ZOFRAN) IV, polyethylene glycol, sodium chloride  Antibiotics  :    Anti-infectives (From admission, onward)   Start     Dose/Rate Route Frequency Ordered Stop   10/12/18 0900  remdesivir 100 mg in  sodium chloride 0.9 % 230 mL IVPB     100 mg 500 mL/hr over 30 Minutes Intravenous Every 24 hours 10/11/18 0745 10/15/18 0928   10/11/18 0900  remdesivir 200 mg in sodium chloride 0.9 % 210 mL IVPB     200 mg 500 mL/hr over 30 Minutes Intravenous Once 10/11/18 0745 10/11/18 1617       Time Spent in minutes  30   Lala Lund M.D on 10/30/2018 at 10:13 AM  To page go to [email or web address] - password [email or web address]  [facility] -  Office  [phone]   See all Orders from today for further details    Objective:   Vitals:   10/29/18 2344 10/30/18 0342 10/30/18 0346 10/30/18 0800  BP: (!) 112/49 (!) 112/49 (!) 113/43 100/76  Pulse: 73 84 69 90  Resp: (!) 27 (!) 25 18 (!) 22  Temp: 97.7 F (36.5 C)  97.7 F (36.5 C) 98.6 F (37 C)  TempSrc: Oral   Oral  SpO2: (!) 82% (!) 80% (!) 82% (!) 84%  Weight:      Height:        Wt Readings from Last 3 Encounters:  10/10/18 108.7 kg  10/10/18 109 kg     Intake/Output Summary (Last 24 hours) at 10/30/2018 1013 Last data filed at 10/30/2018 0600 Gross per 24 hour  Intake 10 ml  Output 575 ml  Net -565 ml     Physical Exam  Awake Alert,  No new F.N deficits, Normal affect [place].AT,PERRAL Supple Neck,No JVD, No cervical lymphadenopathy appriciated.  Symmetrical Chest wall movement, Good air movement bilaterally, few rales RRR,No Gallops, Rubs or new Murmurs, No Parasternal Heave +ve B.Sounds, Abd Soft, No tenderness, No organomegaly appriciated, No rebound - guarding or rigidity. No Cyanosis, Clubbing or edema, No new Rash or bruise    Data Review:    CBC Recent Labs  Lab 10/26/18 0300 10/27/18 0550 10/28/18 0548 10/29/18 0208 10/30/18 0424  WBC 18.5* 18.4* 19.2* 16.8* 17.0*  HGB 12.7 13.3 13.5 13.5 14.1  HCT 40.0 41.9 42.7 43.5 44.9  PLT 254 232 223 223 214  MCV 95.7 96.3 97.5 98.6 96.1  MCH 30.4 30.6 30.8 30.6 30.2  MCHC 31.8 31.7 31.6 31.0 31.4  RDW 17.1* 17.3* 17.4* 17.3* 17.1*  LYMPHSABS 1.1 0.9 0.8 0.8 1.9   MONOABS 0.8 1.0 1.0 0.8 1.2*  EOSABS 0.3 0.0 0.2 0.0 0.2  BASOSABS 0.1 0.0 0.0 0.0 0.0    Chemistries  Recent Labs  Lab 10/26/18 0300 10/27/18 0550 10/28/18 0548 10/29/18 0208 10/30/18 0424  NA 141 143 139 140 138  K 3.8 3.7 3.5 4.2 3.4*  CL 102 101 94* 95* 93*  CO2 _0 GLUCOSE 121* 119* 146* 91 88  BUN 27* 40* 47* 48* 52*  CREATININE 1.03* 0.97 0.99 0.92 0.94  CALCIUM 8.3* 8.7* 8.8* 8.9 9.2  MG 2.3 2.7* 2.7* 2.9* 2.8*  AST 14* 15 14* 12* 13*  ALT _0 ALKPHOS 82 91 86 72 68  BILITOT 0.5 0.5 0.7 0.8 0.7   ------------------------------------------------------------------------------------------------------------------ No results for input(s): CHOL, HDL, LDLCALC, TRIG, CHOLHDL, LDLDIRECT in the last 72 hours.  Lab Results  Component Value Date   HGBA1C 8.1 (H) 10/11/2018   ------------------------------------------------------------------------------------------------------------------ No results for input(s): TSH, T4TOTAL, T3FREE, THYROIDAB in the last 72 hours.  Invalid input(s): FREET3  Cardiac Enzymes No results for input(s): CKMB, TROPONINI, MYOGLOBIN in the last 168 hours.  Invalid input(s): CK ------------------------------------------------------------------------------------------------------------------    Component Value Date/Time   BNP 46.2 10/30/2018 0424    Micro Results Recent Results (from the past 240 hour(s))  Culture, Urine     Status: Abnormal   Collection Time: 10/24/18  7:21 AM   Specimen: Urine, Clean Catch  Result Value Ref Range Status   Specimen Description   Final    URINE, CLEAN CATCH Performed at [facility], [place] [address] [place], [place] [address]    Special Requests   Final    NONE Performed at [facility], [place] [address], [place], [place] [address]    Culture (A)  Final    <10,000 COLONIES/mL INSIGNIFICANT GROWTH Performed at [place] [address] [place], [place] [address]    Report Status 10/25/2018 FINAL  Final    Radiology Reports Dg Chest Port 1 View  Result Date: 10/26/2018 CLINICAL DATA:  Acute respiratory disease.  Hypoxia. EXAM: PORTABLE CHEST 1 VIEW COMPARISON:  10/24/2018 FINDINGS: Diffuse bilateral interstitial and alveolar airspace opacities. No significant interval change compared with the prior exam. No pleural effusion or pneumothorax. Stable cardiomediastinal silhouette. No aggressive osseous lesion. IMPRESSION: Stable diffuse bilateral interstitial and alveolar airspace disease most consistent with multilobar pneumonia including atypical pneumonia. Electronically Signed   By: Kathreen Devoid   On: 10/26/2018 09:39   Dg Chest Port 1 View  Result Date: 10/24/2018 CLINICAL DATA:  Order for SOB  Hx of HTN EXAM: PORTABLE CHEST - 1 VIEW COMPARISON:  10/22/2018 FINDINGS: Progression of bilateral diffuse coarse airspace opacities with relative sparing of the apices. Heart size and mediastinal contours are within normal limits. Aortic Atherosclerosis (ICD10-170.0). No effusion. Visualized bones unremarkable. IMPRESSION: Worsening bilateral infiltrates or edema. Electronically Signed   By: Lucrezia Europe M.D.   On: 10/24/2018 08:55   Dg Chest Port 1 View  Result Date: 10/22/2018 CLINICAL DATA:  Pneumonia. EXAM: PORTABLE CHEST 1 VIEW COMPARISON:  Radiograph of October 10, 2018. FINDINGS: The heart size and mediastinal contours are within normal limits. No pneumothorax or pleural effusion is noted. Slightly improved bilateral lung opacities are noted consistent with multifocal pneumonia. The visualized skeletal structures are unremarkable. IMPRESSION: Slightly improved multifocal pneumonia. Electronically Signed   By: Marijo Conception M.D.   On: 10/22/2018 11:32   Dg Chest Port 1 View  Result Date: 10/10/2018 CLINICAL DATA:  Fever and shortness of breath EXAM: PORTABLE CHEST 1 VIEW COMPARISON:  None. FINDINGS: There are bilateral streaky  airspace opacities, worst in the right upper lobe. Cardiomediastinal contours are normal. No pneumothorax or sizable pleural effusion. IMPRESSION: Bilateral streaky airspace opacities which may indicate multifocal infection. Electronically Signed   By: Ulyses Jarred M.D.   On: 10/10/2018 17:30

## 2018-10-30 NOTE — Progress Notes (Signed)
Potassium 3.4. Text paged Candiss Norse, MD. See new orders.

## 2018-10-31 LAB — COMPREHENSIVE METABOLIC PANEL
ALT: 34 U/L (ref 0–44)
AST: 17 U/L (ref 15–41)
Albumin: 3.1 g/dL — ABNORMAL LOW (ref 3.5–5.0)
Alkaline Phosphatase: 58 U/L (ref 38–126)
Anion gap: 17 — ABNORMAL HIGH (ref 5–15)
BUN: 47 mg/dL — ABNORMAL HIGH (ref 8–23)
CO2: 26 mmol/L (ref 22–32)
Calcium: 9.3 mg/dL (ref 8.9–10.3)
Chloride: 97 mmol/L — ABNORMAL LOW (ref 98–111)
Creatinine, Ser: 0.87 mg/dL (ref 0.44–1.00)
GFR calc Af Amer: >60 mL/min (ref >60)
GFR calc non Af Amer: >60 mL/min (ref >60)
Glucose, Bld: 47 mg/dL — ABNORMAL LOW (ref 70–99)
Potassium: 4.8 mmol/L (ref 3.5–5.1)
Sodium: 140 mmol/L (ref 135–145)
Total Bilirubin: 1 mg/dL (ref 0.3–1.2)
Total Protein: 7.1 g/dL (ref 6.5–8.1)

## 2018-10-31 LAB — CBC WITH DIFFERENTIAL/PLATELET
Abs Immature Granulocytes: 0.07 10*3/uL (ref 0.00–0.07)
Basophils Absolute: 0 10*3/uL (ref 0.0–0.1)
Basophils Relative: 0 %
Eosinophils Absolute: 0.3 10*3/uL (ref 0.0–0.5)
Eosinophils Relative: 2 %
HCT: 44.4 % (ref 36.0–46.0)
Hemoglobin: 14.3 g/dL (ref 12.0–15.0)
Immature Granulocytes: 0 %
Lymphocytes Relative: 7 %
Lymphs Abs: 1.1 10*3/uL (ref 0.7–4.0)
MCH: 31.4 pg (ref 26.0–34.0)
MCHC: 32.2 g/dL (ref 30.0–36.0)
MCV: 97.6 fL (ref 80.0–100.0)
Monocytes Absolute: 1.2 10*3/uL — ABNORMAL HIGH (ref 0.1–1.0)
Monocytes Relative: 7 %
Neutro Abs: 13.6 10*3/uL — ABNORMAL HIGH (ref 1.7–7.7)
Neutrophils Relative %: 84 %
Platelets: 190 10*3/uL (ref 150–400)
RBC: 4.55 MIL/uL (ref 3.87–5.11)
RDW: 17.2 % — ABNORMAL HIGH (ref 11.5–15.5)
WBC: 16.3 10*3/uL — ABNORMAL HIGH (ref 4.0–10.5)
nRBC: 0 % (ref 0.0–0.2)

## 2018-10-31 LAB — BRAIN NATRIURETIC PEPTIDE: B Natriuretic Peptide: 41.7 pg/mL (ref 0.0–100.0)

## 2018-10-31 LAB — GLUCOSE, CAPILLARY
Glucose-Capillary: 91 mg/dL (ref 70–99)
Glucose-Capillary: 304 mg/dL — ABNORMAL HIGH (ref 70–99)
Glucose-Capillary: 277 mg/dL — ABNORMAL HIGH (ref 70–99)
Glucose-Capillary: 130 mg/dL — ABNORMAL HIGH (ref 70–99)

## 2018-10-31 LAB — C-REACTIVE PROTEIN: CRP: 2.1 mg/dL — ABNORMAL HIGH (ref <1.0)

## 2018-10-31 LAB — MAGNESIUM: Magnesium: 2.8 mg/dL — ABNORMAL HIGH (ref 1.7–2.4)

## 2018-10-31 LAB — D-DIMER, QUANTITATIVE: D-Dimer, Quant: 1.52 ug/mL-FEU — ABNORMAL HIGH (ref 0.00–0.50)

## 2018-10-31 LAB — FERRITIN: Ferritin: 236 ng/mL (ref 11–307)

## 2018-10-31 MED ORDER — BISACODYL 5 MG PO TBEC
10.0000 mg | DELAYED_RELEASE_TABLET | Freq: Once | ORAL | Status: AC
  Administered 2018-10-31: 10 mg via ORAL
  Filled 2018-10-31: qty 2

## 2018-10-31 MED ORDER — INSULIN DETEMIR 100 UNIT/ML ~~LOC~~ SOLN
20.0000 [IU] | Freq: Every day | SUBCUTANEOUS | Status: DC
  Administered 2018-10-31 – 2018-11-06 (×7): 20 [IU] via SUBCUTANEOUS
  Filled 2018-10-31 (×7): qty 0.2

## 2018-10-31 MED ORDER — FUROSEMIDE 10 MG/ML IJ SOLN
60.0000 mg | Freq: Once | INTRAMUSCULAR | Status: AC
  Administered 2018-10-31: 60 mg via INTRAVENOUS
  Filled 2018-10-31: qty 6

## 2018-10-31 NOTE — Progress Notes (Signed)
PROGRESS NOTE                                                                                                                                                                                                             Patient Demographics:    Christina Clark, is a 68 y.o. female, DOB - 04/02/1951, FFM:[id number]  Outpatient Primary MD for the patient is Patient, No Pcp Per    LOS - 21  Admit date - 10/10/2018    No chief complaint on file.      Brief Narrative  68 year old Spanish-speaking female with past medical history of morbid obesity, hypertension and depression admitted to the hospitalist service on [place] for COVID pneumonia related acute hypoxic respiratory failure. She was started on Remdisivir, steroids and given Actemra x1.   Subjective:   Patient in bed, appears comfortable, denies any headache, no fever, no chest pain or pressure, feels better sitting in chair with improved shortness of breath , no abdominal pain. No focal weakness.    Assessment  & Plan :     Acute Hypoxic Resp. Failure due to Acute Covid 19 Viral Pneumonitis during the ongoing 2020 Covid 19 Pandemic - she has already received and has completed her cynical course of Remdisivir, steroids, Plasma and Actemra, only mild improvement so far still tenuous and requiring 15 to 20 L high flow nasal cannula oxygen along with nonrebreather mask, some element of orthopnea as well.  Continue to advance activity, added flutter valve and I-S for pulmonary toiletry encouraged to sit up in chair and daytime.  Inflammatory markers are borderline.  Unfortunately patient continues to be tenuous and hypoxic.  Note since that she has developed a touch of CHF proning will not help her, advised nursing staff to keep the head of the bed propped up at least 90 degrees if she is in bed or preferably sit in chair in the daytime.  Detailed discussion with  patient and family including her daughter and son-in-law were made on 10/25/2018 and 10/26/2018.  They do not want intubation but wants full medical treatment short of that.  This will be continued, they understand that there is a good chance patient might pass away this admission.  If she declines further goal of care will be comfort.  Short trial of IV steroids again as she  continues to be tenuous and no further treatment left along with Lasix for ongoing diuresis.Marland Kitchen  COVID-19 Labs  Recent Labs    10/29/18 0208 10/30/18 0424 10/31/18 0320  DDIMER 1.89* 1.89* 1.52*  FERRITIN 226 239 236  LDH 432*  --   --   CRP 2.6* 1.9* 2.1*    Lab Results  Component Value Date   SARSCOV2NAA POSITIVE (A) 10/10/2018     Hepatic Function Latest Ref Rng & Units 10/31/2018 10/30/2018 10/29/2018  Total Protein 6.5 - 8.1 g/dL 7.1 7.2 7.0  Albumin 3.5 - 5.0 g/dL 3.1(L) 3.2(L) 3.2(L)  AST 15 - 41 U/L 17 13(L) 12(L)  ALT 0 - 44 U/L 34 29 24  Alk Phosphatase 38 - 126 U/L 58 68 72  Total Bilirubin 0.3 - 1.2 mg/dL 1.0 0.7 0.8        Component Value Date/Time   BNP 41.7 10/31/2018 0320     2.  AKI.  Resolved after hydration.  3.  Essential hypertension.  Blood pressure stable not on any medications currently.  4.  DM depression.  On home dose Zoloft along with low-dose Xanax and stable.  5.  Morbid obesity BMI 41.  Follow with PCP for weight loss and diet control.   6.  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  Outpatient echocardiogram.  Continue IV Lasix, repeat 10/31/2018 blood pressure too low for beta-blocker or Nitropaste which were tried earlier.  Avoid proning.  7.  Mild sinus tachycardia with hypotension.  Stable TSH, resolved.  Lab Results  Component Value Date   TSH 2.280 10/24/2018    8.  Hypokalemia.  Replace and monitor.    9.  Deconditioning.  Encouraged to sit in chair every day, daughter updated on 10/30/2018, patient willing to do, PT requested to assist the patient in doing so, it  will be expected for her to get slightly hypoxic from her baseline levels when she is moving.  This should not prohibit PT treatments unless patient gets profoundly symptomatic.    10. DM type II in poor outpatient control due to hyperglycemia.  A1c 8.5.  She was not on any medications at home.  Currently on insulin.  CBG stable.  Will give her DM and insulin education.  CBG (last 3)  Recent Labs    10/30/18 1624 10/30/18 2133 10/31/18 0739  GLUCAP 253* 130* 91    Condition - Extremely Guarded  Family Communication  :  daughter over the phone 10/24/18, 10/25/2018, 10/26/18, 10/27/18, 10/29/18  Code Status : No intubation no CPR.  Diet :   Diet Order            DIET DYS 3 Room service appropriate? Yes; Fluid consistency: Thin  Diet effective now               Disposition Plan  : PCU.  Consults  :  None  Procedures  :  None  PUD Prophylaxis :   DVT Prophylaxis  :  Lovenox   Lab Results  Component Value Date   PLT 190 10/31/2018    Inpatient Medications  Scheduled Meds: . sodium chloride   Intravenous Once  . clonazepam  0.5 mg Oral BID  . enoxaparin (LOVENOX) injection  0.5 mg/kg Subcutaneous Q12H  . insulin aspart  0-20 Units Subcutaneous TID WC  . insulin aspart  0-5 Units Subcutaneous QHS  . insulin aspart  4 Units Subcutaneous TID WC  . insulin detemir  25 Units Subcutaneous QHS  . methylPREDNISolone (SOLU-MEDROL)  injection  60 mg Intravenous Daily  . Ensure Max Protein  11 oz Oral Daily  . sertraline  50 mg Oral Daily  . sodium chloride flush  10-40 mL Intracatheter Q12H   Continuous Infusions:  PRN Meds:.acetaminophen, albuterol, ALPRAZolam, lip balm, menthol-cetylpyridinium, morphine injection, [DISCONTINUED] ondansetron **OR** ondansetron (ZOFRAN) IV, polyethylene glycol, sodium chloride  Antibiotics  :    Anti-infectives (From admission, onward)   Start     Dose/Rate Route Frequency Ordered Stop   10/12/18 0900  remdesivir 100 mg in sodium  chloride 0.9 % 230 mL IVPB     100 mg 500 mL/hr over 30 Minutes Intravenous Every 24 hours 10/11/18 0745 10/15/18 0928   10/11/18 0900  remdesivir 200 mg in sodium chloride 0.9 % 210 mL IVPB     200 mg 500 mL/hr over 30 Minutes Intravenous Once 10/11/18 0745 10/11/18 1617       Time Spent in minutes  30   Lala Lund M.D on 10/31/2018 at 10:38 AM  To page go to [email or web address] - password [facility]  [facility] -  Office  [phone]   See all Orders from today for further details    Objective:   Vitals:   10/31/18 0440 10/31/18 0742 10/31/18 0745 10/31/18 0817  BP:   97/62 109/63  Pulse:   89 87  Resp:   (!) 24 (!) 21  Temp: 97.7 F (36.5 C) 97.7 F (36.5 C)    TempSrc: Oral Oral    SpO2:   (!) 85% (!) 85%  Weight:      Height:        Wt Readings from Last 3 Encounters:  10/10/18 108.7 kg  10/10/18 109 kg     Intake/Output Summary (Last 24 hours) at 10/31/2018 1038 Last data filed at 10/31/2018 0708 Gross per 24 hour  Intake 480 ml  Output 1000 ml  Net -520 ml     Physical Exam  Awake Alert,  No new F.N deficits, Normal affect [place].AT,PERRAL Supple Neck,No JVD, No cervical lymphadenopathy appriciated.  Symmetrical Chest wall movement, Good air movement bilaterally, CTAB RRR,No Gallops, Rubs or new Murmurs, No Parasternal Heave +ve B.Sounds, Abd Soft, No tenderness, No organomegaly appriciated, No rebound - guarding or rigidity. No Cyanosis, Clubbing or edema, No new Rash or bruise   Data Review:    CBC  Recent Labs  Lab 10/27/18 0550 10/28/18 0548 10/29/18 0208 10/30/18 0424 10/31/18 0320  WBC 18.4* 19.2* 16.8* 17.0* 16.3*  HGB 13.3 13.5 13.5 14.1 14.3  HCT 41.9 42.7 43.5 44.9 44.4  PLT 232 223 223 214 190  MCV 96.3 97.5 98.6 96.1 97.6  MCH 30.6 30.8 30.6 30.2 31.4  MCHC 31.7 31.6 31.0 31.4 32.2  RDW 17.3* 17.4* 17.3* 17.1* 17.2*  LYMPHSABS 0.9 0.8 0.8 1.9 1.1  MONOABS 1.0 1.0 0.8 1.2* 1.2*  EOSABS 0.0 0.2 0.0 0.2 0.3  BASOSABS 0.0 0.0  0.0 0.0 0.0    Chemistries  Recent Labs  Lab 10/27/18 0550 10/28/18 0548 10/29/18 0208 10/30/18 0424 10/31/18 0320  NA 143 139 140 138 140  K 3.7 3.5 4.2 3.4* 4.8  CL 101 94* 95* 93* 97*  CO2 '29 31 30 31 26  '$ GLUCOSE 119* 146* 91 88 47*  BUN 40* 47* 48* 52* 47*  CREATININE 0.97 0.99 0.92 0.94 0.87  CALCIUM 8.7* 8.8* 8.9 9.2 9.3  MG 2.7* 2.7* 2.9* 2.8* 2.8*  AST 15 14* 12* 13* 17  ALT '27 24 24 29 '$ 34  ALKPHOS 91  86 72 68 58  BILITOT 0.5 0.7 0.8 0.7 1.0   ------------------------------------------------------------------------------------------------------------------ No results for input(s): CHOL, HDL, LDLCALC, TRIG, CHOLHDL, LDLDIRECT in the last 72 hours.  Lab Results  Component Value Date   HGBA1C 8.1 (H) 10/11/2018   ------------------------------------------------------------------------------------------------------------------ No results for input(s): TSH, T4TOTAL, T3FREE, THYROIDAB in the last 72 hours.  Invalid input(s): FREET3  Cardiac Enzymes No results for input(s): CKMB, TROPONINI, MYOGLOBIN in the last 168 hours.  Invalid input(s): CK ------------------------------------------------------------------------------------------------------------------    Component Value Date/Time   BNP 41.7 10/31/2018 0320    Micro Results Recent Results (from the past 240 hour(s))  Culture, Urine     Status: Abnormal   Collection Time: 10/24/18  7:21 AM   Specimen: Urine, Clean Catch  Result Value Ref Range Status   Specimen Description   Final    URINE, CLEAN CATCH Performed at [facility], [place] [address], [place], [place] [address]    Special Requests   Final    NONE Performed at [facility], [place] [address], [place], [place] [address]    Culture (A)  Final    <10,000 COLONIES/mL INSIGNIFICANT GROWTH Performed at [place] [address] [place], [place] [address]    Report Status 10/25/2018 FINAL  Final     Radiology Reports Dg Chest Port 1 View  Result Date: 10/26/2018 CLINICAL DATA:  Acute respiratory disease.  Hypoxia. EXAM: PORTABLE CHEST 1 VIEW COMPARISON:  10/24/2018 FINDINGS: Diffuse bilateral interstitial and alveolar airspace opacities. No significant interval change compared with the prior exam. No pleural effusion or pneumothorax. Stable cardiomediastinal silhouette. No aggressive osseous lesion. IMPRESSION: Stable diffuse bilateral interstitial and alveolar airspace disease most consistent with multilobar pneumonia including atypical pneumonia. Electronically Signed   By: Kathreen Devoid   On: 10/26/2018 09:39   Dg Chest Port 1 View  Result Date: 10/24/2018 CLINICAL DATA:  Order for SOB  Hx of HTN EXAM: PORTABLE CHEST - 1 VIEW COMPARISON:  10/22/2018 FINDINGS: Progression of bilateral diffuse coarse airspace opacities with relative sparing of the apices. Heart size and mediastinal contours are within normal limits. Aortic Atherosclerosis (ICD10-170.0). No effusion. Visualized bones unremarkable. IMPRESSION: Worsening bilateral infiltrates or edema. Electronically Signed   By: Lucrezia Europe M.D.   On: 10/24/2018 08:55   Dg Chest Port 1 View  Result Date: 10/22/2018 CLINICAL DATA:  Pneumonia. EXAM: PORTABLE CHEST 1 VIEW COMPARISON:  Radiograph of October 10, 2018. FINDINGS: The heart size and mediastinal contours are within normal limits. No pneumothorax or pleural effusion is noted. Slightly improved bilateral lung opacities are noted consistent with multifocal pneumonia. The visualized skeletal structures are unremarkable. IMPRESSION: Slightly improved multifocal pneumonia. Electronically Signed   By: Marijo Conception M.D.   On: 10/22/2018 11:32   Dg Chest Port 1 View  Result Date: 10/10/2018 CLINICAL DATA:  Fever and shortness of breath EXAM: PORTABLE CHEST 1 VIEW COMPARISON:  None. FINDINGS: There are bilateral streaky airspace opacities, worst in the right upper lobe. Cardiomediastinal contours are  normal. No pneumothorax or sizable pleural effusion. IMPRESSION: Bilateral streaky airspace opacities which may indicate multifocal infection. Electronically Signed   By: Ulyses Jarred M.D.   On: 10/10/2018 17:30

## 2018-10-31 NOTE — Progress Notes (Signed)
Inpatient Rehab Admissions Coordinator:   Continuing to follow at a distance.  Note pt still requiring significant oxygen support.    Shann Medal, PT, DPT Admissions Coordinator [phone] 10/31/18  1:04 PM

## 2018-10-31 NOTE — Progress Notes (Signed)
Inpatient Diabetes Program Recommendations  AACE/ADA: New Consensus Statement on Inpatient Glycemic Control (2015)  Target Ranges:  Prepandial:   less than 140 mg/dL      Peak postprandial:   less than 180 mg/dL (1-2 hours)      Critically ill patients:  140 - 180 mg/dL   Lab Results  Component Value Date   GLUCAP 304 (H) 10/31/2018   HGBA1C 8.1 (H) 10/11/2018    Review of Glycemic Control Results for VINETA, CARONE (MRN [id number]) as of 10/31/2018 14:00  Ref. Range 10/31/2018 07:39 10/31/2018 11:09  Glucose-Capillary Latest Ref Range: 70 - 99 mg/dL 91 304 (H)   Diabetes history: DM 2 Outpatient Diabetes medications:  None Current orders for Inpatient glycemic control:  Levemir 25 units q HS, Novolog 4 units tid with meals, Novolog resistant tid with meals and HS Solumedrol 60 mg IV daily Inpatient Diabetes Program Recommendations:   Consider slight reduction in Levemir to 20 units q HS.  Also appears to need more meal coverage.  Consider increasing Novolog meal coverage to 6 units tid with meals.   Thanks  Adah Perl, RN, BC-ADM Inpatient Diabetes Coordinator Pager [phone] (8a-5p)

## 2018-10-31 NOTE — Progress Notes (Signed)
Occupational Therapy Treatment Patient Details Name: Christina Clark MRN: [id number] DOB: 18-Jun-1950 Today's Date: 10/31/2018    History of present illness 68 year old Spanish-speaking female with past medical history of morbid obesity, hypertension and depression  presenting to the emergency room 10/10/18 with 1 week of fever, cough and shortness of breath in the setting of known COVID-19 contacts.  Patient found to be COVID positive as well as hypoxic with bilateral infiltrates on chest x-ray.  Requiring oxygen support of high flow   OT comments  Excellent session today. Pt seen on 40L 100% FiO2. Used traditional [place] music to incorporate dance into exercise. Completed at least 25 min of exercise with multiple rest breaks. Sit - stand with use of RW to  Steady pt, however pt only able to stand for 5-10 seconds before requiring to sit. Desat into low 70s with rebound to 80s after rest. Pt with improved ability to calm herself and reduce her RR. Pt states "I want to look beautiful today". Assisted pt with washing, then braiding her hair. Face timed daughter after session and pt was very bright, happy and joking with daughter. Left pt with Spanish word search to complete when upright. Pt very appreciative.  Will continue to follow acutely. At this time, feel pt would benefit from post acute rehab to facilitate safe DC home.   Follow Up Recommendations  CIR;Supervision/Assistance - 24 hour    Equipment Recommendations  3 in 1 bedside commode    Recommendations for Other Services Rehab consult    Precautions / Restrictions Precautions Precautions: Fall Precaution Comments: monitor sats , on 40L; 100%       Mobility Bed Mobility               General bed mobility comments: OOB in chair  Transfers Overall transfer level: Needs assistance Equipment used: Rolling walker (2 wheeled) Transfers: Sit to/from Stand Sit to Stand: Min assist              Balance Overall  balance assessment: Mild deficits observed, not formally tested                                         ADL either performed or assessed with clinical judgement   ADL Overall ADL's : Needs assistance/impaired     Grooming: Minimal assistance;Bed level Grooming Details (indicate cue type and reason): hair washed with shampoo bag; pt assisted with washing hair     Lower Body Bathing: Moderate assistance;Sit to/from stand       Lower Body Dressing: Moderate assistance;Sit to/from stand               Functional mobility during ADLs: Minimal assistance;Rolling walker General ADL Comments: Limited by poor activity tolerance     Vision       Perception     Praxis      Cognition Arousal/Alertness: Awake/alert Behavior During Therapy: WFL for tasks assessed/performed Overall Cognitive Status: Within Functional Limits for tasks assessed                                          Exercises Exercises: General Upper Extremity General Exercises - Upper Extremity Shoulder Flexion: AROM;Strengthening;Both;20 reps;Seated Shoulder Extension: AROM;Strengthening;Both;20 reps;Seated Shoulder ABduction: AROM;Strengthening;Both;20 reps;Seated Shoulder Horizontal ABduction: Strengthening;Both;20 reps;Seated Shoulder Horizontal ADduction: Strengthening;Both;20 reps;Seated  Elbow Flexion: Strengthening;Both;20 reps;Seated Elbow Extension: Strengthening;Both;20 reps;Seated General Exercises - Lower Extremity Ankle Circles/Pumps: Strengthening;20 reps;Seated Long Arc Quad: Strengthening;Both;20 reps;Seated Hip ABduction/ADduction: Strengthening;20 reps;Seated Hip Flexion/Marching: Strengthening;Both;20 reps;Seated Other Exercises Other Exercises: Sit - stand x 2 - able to maintain standing for 5- 10 seconds before having to sit   Shoulder Instructions       General Comments Used traditional [place] music and dance during session to engage pt in  exercise. Pt participated in at least 15 min of exercise with multiple rest breaks; issued spanish word search    Pertinent Vitals/ Pain       Pain Assessment: Faces Faces Pain Scale: Hurts a little bit Pain Location: discomfort with breathing Pain Descriptors / Indicators: Discomfort;Grimacing Pain Intervention(s): Limited activity within patient's tolerance  Home Living                                          Prior Functioning/Environment              Frequency  Min 3X/week        Progress Toward Goals  OT Goals(current goals can now be found in the care plan section)  Progress towards OT goals: Progressing toward goals  Acute Rehab OT Goals Patient Stated Goal: to breath better OT Goal Formulation: With patient/family Time For Goal Achievement: 11/04/18 Potential to Achieve Goals: Good ADL Goals Pt Will Perform Upper Body Bathing: with modified independence;sitting Pt Will Perform Lower Body Bathing: with modified independence;sit to/from stand;with adaptive equipment Pt Will Perform Lower Body Dressing: with modified independence;sit to/from stand;with adaptive equipment Pt Will Transfer to Toilet: with modified independence;ambulating;bedside commode Pt Will Perform Toileting - Clothing Manipulation and hygiene: with modified independence;sit to/from stand Pt/caregiver will Perform Home Exercise Program: Increased strength;With theraband;Both right and left upper extremity;With Supervision;With written HEP provided Additional ADL Goal #1: Pt will independently verbalize 3 energy conservation strategies for ADL  Plan Discharge plan remains appropriate    Co-evaluation                 AM-PAC OT "6 Clicks" Daily Activity     Outcome Measure   Help from another person eating meals?: None Help from another person taking care of personal grooming?: A Little Help from another person toileting, which includes using toliet, bedpan, or  urinal?: A Lot Help from another person bathing (including washing, rinsing, drying)?: A Lot Help from another person to put on and taking off regular upper body clothing?: A Lot Help from another person to put on and taking off regular lower body clothing?: A Lot 6 Click Score: 15    End of Session Equipment Utilized During Treatment: Oxygen(40L; FiO2 100%)  OT Visit Diagnosis: Unsteadiness on feet (R26.81);Muscle weakness (generalized) (M62.81)   Activity Tolerance Patient tolerated treatment well   Patient Left in chair;with call bell/phone within reach   Nurse Communication Mobility status        Time: [id number] OT Time Calculation (min): 59 min  Charges: OT General Charges $OT Visit: 1 Visit OT Treatments $Self Care/Home Management : 8-22 mins $Therapeutic Activity: 8-22 mins $Therapeutic Exercise: 23-37 mins  Luisa DagoHilary Corbett Moulder, OT/L   Acute OT Clinical Specialist Acute Rehabilitation Services Pager [phone] Office [phone]    [facility] LLCWARD,HILLARY 10/31/2018, 2:56 PM

## 2018-11-01 LAB — COMPREHENSIVE METABOLIC PANEL
ALT: 41 U/L (ref 0–44)
AST: 16 U/L (ref 15–41)
Albumin: 3.4 g/dL — ABNORMAL LOW (ref 3.5–5.0)
Alkaline Phosphatase: 58 U/L (ref 38–126)
Anion gap: 17 — ABNORMAL HIGH (ref 5–15)
BUN: 55 mg/dL — ABNORMAL HIGH (ref 8–23)
CO2: 27 mmol/L (ref 22–32)
Calcium: 9.2 mg/dL (ref 8.9–10.3)
Chloride: 93 mmol/L — ABNORMAL LOW (ref 98–111)
Creatinine, Ser: 0.96 mg/dL (ref 0.44–1.00)
GFR calc Af Amer: >60 mL/min (ref >60)
GFR calc non Af Amer: >60 mL/min (ref >60)
Glucose, Bld: 58 mg/dL — ABNORMAL LOW (ref 70–99)
Potassium: 3.9 mmol/L (ref 3.5–5.1)
Sodium: 137 mmol/L (ref 135–145)
Total Bilirubin: 0.8 mg/dL (ref 0.3–1.2)
Total Protein: 7.5 g/dL (ref 6.5–8.1)

## 2018-11-01 LAB — CBC WITH DIFFERENTIAL/PLATELET
Abs Immature Granulocytes: 0.07 10*3/uL (ref 0.00–0.07)
Basophils Absolute: 0 10*3/uL (ref 0.0–0.1)
Basophils Relative: 0 %
Eosinophils Absolute: 0.2 10*3/uL (ref 0.0–0.5)
Eosinophils Relative: 1 %
HCT: 46.6 % — ABNORMAL HIGH (ref 36.0–46.0)
Hemoglobin: 15.2 g/dL — ABNORMAL HIGH (ref 12.0–15.0)
Immature Granulocytes: 1 %
Lymphocytes Relative: 7 %
Lymphs Abs: 1 10*3/uL (ref 0.7–4.0)
MCH: 31.6 pg (ref 26.0–34.0)
MCHC: 32.6 g/dL (ref 30.0–36.0)
MCV: 96.9 fL (ref 80.0–100.0)
Monocytes Absolute: 0.9 10*3/uL (ref 0.1–1.0)
Monocytes Relative: 7 %
Neutro Abs: 12.4 10*3/uL — ABNORMAL HIGH (ref 1.7–7.7)
Neutrophils Relative %: 84 %
Platelets: 197 10*3/uL (ref 150–400)
RBC: 4.81 MIL/uL (ref 3.87–5.11)
RDW: 17.2 % — ABNORMAL HIGH (ref 11.5–15.5)
WBC: 14.6 10*3/uL — ABNORMAL HIGH (ref 4.0–10.5)
nRBC: 0 % (ref 0.0–0.2)

## 2018-11-01 LAB — GLUCOSE, CAPILLARY
Glucose-Capillary: 90 mg/dL (ref 70–99)
Glucose-Capillary: 226 mg/dL — ABNORMAL HIGH (ref 70–99)
Glucose-Capillary: 266 mg/dL — ABNORMAL HIGH (ref 70–99)
Glucose-Capillary: 130 mg/dL — ABNORMAL HIGH (ref 70–99)

## 2018-11-01 LAB — D-DIMER, QUANTITATIVE: D-Dimer, Quant: 1.08 ug/mL-FEU — ABNORMAL HIGH (ref 0.00–0.50)

## 2018-11-01 LAB — BRAIN NATRIURETIC PEPTIDE: B Natriuretic Peptide: 40.5 pg/mL (ref 0.0–100.0)

## 2018-11-01 LAB — FERRITIN: Ferritin: 359 ng/mL — ABNORMAL HIGH (ref 11–307)

## 2018-11-01 LAB — MAGNESIUM: Magnesium: 2.9 mg/dL — ABNORMAL HIGH (ref 1.7–2.4)

## 2018-11-01 LAB — C-REACTIVE PROTEIN: CRP: 5.8 mg/dL — ABNORMAL HIGH (ref <1.0)

## 2018-11-01 MED ORDER — POTASSIUM CHLORIDE CRYS ER 20 MEQ PO TBCR
20.0000 meq | EXTENDED_RELEASE_TABLET | Freq: Once | ORAL | Status: AC
  Administered 2018-11-01: 20 meq via ORAL
  Filled 2018-11-01: qty 1

## 2018-11-01 MED ORDER — FUROSEMIDE 10 MG/ML IJ SOLN
40.0000 mg | Freq: Once | INTRAMUSCULAR | Status: AC
  Administered 2018-11-01: 11:00:00 40 mg via INTRAVENOUS
  Filled 2018-11-01: qty 4

## 2018-11-01 NOTE — Plan of Care (Signed)
Daughter talked to via phone and updated on today POC.

## 2018-11-01 NOTE — Progress Notes (Signed)
Switched pt from heated HFNC to [facility] HFNC at [facility]. Pt translated through daughter and states she feels great on this HFNC and VS are within normal limits. RT will closely monitor pt for possible need to go back on Heated HFNC. RN aware

## 2018-11-01 NOTE — Plan of Care (Signed)

## 2018-11-01 NOTE — Progress Notes (Signed)
PROGRESS NOTE                                                                                                                                                                                                             Patient Demographics:    Christina Clark, is a 68 y.o. female, DOB - 06-05-1950, VXY:[id number]  Outpatient Primary MD for the patient is Patient, No Pcp Per    LOS - 22  Admit date - 10/10/2018    No chief complaint on file.      Brief Narrative  68 year old Spanish-speaking female with past medical history of morbid obesity, hypertension and depression admitted to the hospitalist service on [place] for COVID pneumonia related acute hypoxic respiratory failure. She was started on Remdisivir, steroids and given Actemra x1.   Subjective:   Patient in bed, appears comfortable, denies any headache, no fever, no chest pain or pressure, improving shortness of breath , no abdominal pain. No focal weakness.   Assessment  & Plan :     Acute Hypoxic Resp. Failure due to Acute Covid 19 Viral Pneumonitis during the ongoing 2020 Covid 19 Pandemic - she has already received and has completed her cynical course of Remdisivir, steroids, Plasma and Actemra, only mild improvement so far still tenuous and requiring 15 to 20 L high flow nasal cannula oxygen along with nonrebreather mask, some element of orthopnea as well.  Continue to advance activity, added flutter valve and I-S for pulmonary toiletry encouraged to sit up in chair and daytime.  Inflammatory markers are borderline.  Unfortunately patient continues to be tenuous and hypoxic.  Note since that she has developed a touch of CHF proning will not help her, advised nursing staff to keep the head of the bed propped up at least 90 degrees if she is in bed or preferably sit in chair in the daytime.  Detailed discussion with patient and family including her  daughter and son-in-law were made on 10/25/2018 and 10/26/2018.  They do not want intubation but wants full medical treatment short of that.  This will be continued, they understand that there is a good chance patient might pass away this admission.  If she declines further goal of care will be comfort.  Resumed on trial of IV steroids again as she continues to be tenuous and no  further treatment left along with Lasix for ongoing diuresis with some improvement in the last 2 days.    Monitor inflammatory markers closely.  COVID-19 Labs  Recent Labs    10/30/18 0424 10/31/18 0320 11/01/18 0300  DDIMER 1.89* 1.52* 1.08*  FERRITIN 239 236 359*  CRP 1.9* 2.1* 5.8*    Lab Results  Component Value Date   SARSCOV2NAA POSITIVE (A) 10/10/2018     Hepatic Function Latest Ref Rng & Units 11/01/2018 10/31/2018 10/30/2018  Total Protein 6.5 - 8.1 g/dL 7.5 7.1 7.2  Albumin 3.5 - 5.0 g/dL 3.4(L) 3.1(L) 3.2(L)  AST 15 - 41 U/L 16 17 13(L)  ALT 0 - 44 U/L 41 34 29  Alk Phosphatase 38 - 126 U/L 58 58 68  Total Bilirubin 0.3 - 1.2 mg/dL 0.8 1.0 0.7        Component Value Date/Time   BNP 40.5 11/01/2018 0300     2.  AKI.  Resolved after hydration.  3.  Essential hypertension.  Blood pressure stable not on any medications currently.  4.  H/O depression.  On home dose Zoloft along with low-dose Xanax and stable.  5.  Morbid obesity BMI 41.  Follow with PCP for weight loss and diet control.   6.  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  Outpatient echocardiogram.  Continue IV Lasix, repeat 10/31/2018 blood pressure too low for beta-blocker or Nitropaste which were tried earlier.  Avoid proning.  7.  Mild sinus tachycardia with hypotension.  Stable TSH, resolved.  Lab Results  Component Value Date   TSH 2.280 10/24/2018    8.  Hypokalemia.  Replaced and monitor.    9.  Deconditioning.  Encouraged to sit in chair every day, daughter updated on 10/30/2018, patient willing to do, PT requested to  assist the patient in doing so, it will be expected for her to get slightly hypoxic from her baseline levels when she is moving.  This should not prohibit PT treatments unless patient gets profoundly symptomatic.    10. DM type II in poor outpatient control due to hyperglycemia.  A1c 8.5.  She was not on any medications at home.  Insulin dose adjusted for tapering steroid dose on 11/01/2018.  CBG stable.  Will give her DM and insulin education.  CBG (last 3)  Recent Labs    10/31/18 1609 10/31/18 2157 11/01/18 0826  GLUCAP 277* 130* 90    Condition - Extremely Guarded  Family Communication  :  daughter over the phone 10/24/18, 10/25/2018, 10/26/18, 10/27/18, 10/29/18  Code Status : No intubation no CPR.  Diet :   Diet Order            DIET DYS 3 Room service appropriate? Yes; Fluid consistency: Thin  Diet effective now               Disposition Plan  : PCU.  Consults  :  None  Procedures  :  None  PUD Prophylaxis :   DVT Prophylaxis  :  Lovenox   Lab Results  Component Value Date   PLT 197 11/01/2018    Inpatient Medications  Scheduled Meds: . sodium chloride   Intravenous Once  . clonazepam  0.5 mg Oral BID  . enoxaparin (LOVENOX) injection  0.5 mg/kg Subcutaneous Q12H  . furosemide  40 mg Intravenous Once  . insulin aspart  0-20 Units Subcutaneous TID WC  . insulin detemir  20 Units Subcutaneous QHS  . methylPREDNISolone (SOLU-MEDROL) injection  60 mg Intravenous  Daily  . potassium chloride  20 mEq Oral Once  . Ensure Max Protein  11 oz Oral Daily  . sertraline  50 mg Oral Daily  . sodium chloride flush  10-40 mL Intracatheter Q12H   Continuous Infusions:  PRN Meds:.acetaminophen, albuterol, ALPRAZolam, lip balm, menthol-cetylpyridinium, morphine injection, [DISCONTINUED] ondansetron **OR** ondansetron (ZOFRAN) IV, polyethylene glycol, sodium chloride  Antibiotics  :    Anti-infectives (From admission, onward)   Start     Dose/Rate Route Frequency Ordered  Stop   10/12/18 0900  remdesivir 100 mg in sodium chloride 0.9 % 230 mL IVPB     100 mg 500 mL/hr over 30 Minutes Intravenous Every 24 hours 10/11/18 0745 10/15/18 0928   10/11/18 0900  remdesivir 200 mg in sodium chloride 0.9 % 210 mL IVPB     200 mg 500 mL/hr over 30 Minutes Intravenous Once 10/11/18 0745 10/11/18 1617       Time Spent in minutes  30   Lala Lund M.D on 11/01/2018 at 9:57 AM  To page go to [email or web address] - password [facility]  [facility] -  Office  [phone]   See all Orders from today for further details    Objective:   Vitals:   11/01/18 0400 11/01/18 0530 11/01/18 0752 11/01/18 0800  BP: (!) 95/55  (!) 107/44 (!) 106/51  Pulse: 78  78 77  Resp: 19  18 (!) 21  Temp:  (!) 97.5 F (36.4 C) 97.7 F (36.5 C) 97.7 F (36.5 C)  TempSrc:  Oral Oral Oral  SpO2: 94%  93% 93%  Weight:      Height:        Wt Readings from Last 3 Encounters:  10/10/18 108.7 kg  10/10/18 109 kg     Intake/Output Summary (Last 24 hours) at 11/01/2018 0957 Last data filed at 11/01/2018 2778 Gross per 24 hour  Intake 480 ml  Output 750 ml  Net -270 ml     Physical Exam  Awake Alert,  No new F.N deficits, Normal affect [place].AT,PERRAL Supple Neck,No JVD, No cervical lymphadenopathy appriciated.  Symmetrical Chest wall movement, Good air movement bilaterally, CTAB RRR,No Gallops, Rubs or new Murmurs, No Parasternal Heave +ve B.Sounds, Abd Soft, No tenderness, No organomegaly appriciated, No rebound - guarding or rigidity. No Cyanosis, Clubbing or edema, No new Rash or bruise    Data Review:    CBC  Recent Labs  Lab 10/28/18 0548 10/29/18 0208 10/30/18 0424 10/31/18 0320 11/01/18 0300  WBC 19.2* 16.8* 17.0* 16.3* 14.6*  HGB 13.5 13.5 14.1 14.3 15.2*  HCT 42.7 43.5 44.9 44.4 46.6*  PLT 223 223 214 190 197  MCV 97.5 98.6 96.1 97.6 96.9  MCH 30.8 30.6 30.2 31.4 31.6  MCHC 31.6 31.0 31.4 32.2 32.6  RDW 17.4* 17.3* 17.1* 17.2* 17.2*  LYMPHSABS 0.8 0.8  1.9 1.1 1.0  MONOABS 1.0 0.8 1.2* 1.2* 0.9  EOSABS 0.2 0.0 0.2 0.3 0.2  BASOSABS 0.0 0.0 0.0 0.0 0.0    Chemistries  Recent Labs  Lab 10/28/18 0548 10/29/18 0208 10/30/18 0424 10/31/18 0320 11/01/18 0300  NA 139 140 138 140 137  K 3.5 4.2 3.4* 4.8 3.9  CL 94* 95* 93* 97* 93*  CO2 '31 30 31 26 27  '$ GLUCOSE 146* 91 88 47* 58*  BUN 47* 48* 52* 47* 55*  CREATININE 0.99 0.92 0.94 0.87 0.96  CALCIUM 8.8* 8.9 9.2 9.3 9.2  MG 2.7* 2.9* 2.8* 2.8* 2.9*  AST 14* 12* 13* 17 16  ALT '24 24 29 '$ 34 41  ALKPHOS 86 72 68 58 58  BILITOT 0.7 0.8 0.7 1.0 0.8   ------------------------------------------------------------------------------------------------------------------ No results for input(s): CHOL, HDL, LDLCALC, TRIG, CHOLHDL, LDLDIRECT in the last 72 hours.  Lab Results  Component Value Date   HGBA1C 8.1 (H) 10/11/2018   ------------------------------------------------------------------------------------------------------------------ No results for input(s): TSH, T4TOTAL, T3FREE, THYROIDAB in the last 72 hours.  Invalid input(s): FREET3  Cardiac Enzymes No results for input(s): CKMB, TROPONINI, MYOGLOBIN in the last 168 hours.  Invalid input(s): CK ------------------------------------------------------------------------------------------------------------------    Component Value Date/Time   BNP 40.5 11/01/2018 0300    Micro Results Recent Results (from the past 240 hour(s))  Culture, Urine     Status: Abnormal   Collection Time: 10/24/18  7:21 AM   Specimen: Urine, Clean Catch  Result Value Ref Range Status   Specimen Description   Final    URINE, CLEAN CATCH Performed at [facility], [place] [address], [place], [place] [address]    Special Requests   Final    NONE Performed at [facility], [place] [address], [place], [place] [address]    Culture (A)  Final    <10,000 COLONIES/mL INSIGNIFICANT GROWTH Performed at [place] [address], [place], [place] [address]    Report Status 10/25/2018 FINAL  Final    Radiology Reports Dg Chest Port 1 View  Result Date: 10/26/2018 CLINICAL DATA:  Acute respiratory disease.  Hypoxia. EXAM: PORTABLE CHEST 1 VIEW COMPARISON:  10/24/2018 FINDINGS: Diffuse bilateral interstitial and alveolar airspace opacities. No significant interval change compared with the prior exam. No pleural effusion or pneumothorax. Stable cardiomediastinal silhouette. No aggressive osseous lesion. IMPRESSION: Stable diffuse bilateral interstitial and alveolar airspace disease most consistent with multilobar pneumonia including atypical pneumonia. Electronically Signed   By: Kathreen Devoid   On: 10/26/2018 09:39   Dg Chest Port 1 View  Result Date: 10/24/2018 CLINICAL DATA:  Order for SOB  Hx of HTN EXAM: PORTABLE CHEST - 1 VIEW COMPARISON:  10/22/2018 FINDINGS: Progression of bilateral diffuse coarse airspace opacities with relative sparing of the apices. Heart size and mediastinal contours are within normal limits. Aortic Atherosclerosis (ICD10-170.0). No effusion. Visualized bones unremarkable. IMPRESSION: Worsening bilateral infiltrates or edema. Electronically Signed   By: Lucrezia Europe M.D.   On: 10/24/2018 08:55   Dg Chest Port 1 View  Result Date: 10/22/2018 CLINICAL DATA:  Pneumonia. EXAM: PORTABLE CHEST 1 VIEW COMPARISON:  Radiograph of October 10, 2018. FINDINGS: The heart size and mediastinal contours are within normal limits. No pneumothorax or pleural effusion is noted. Slightly improved bilateral lung opacities are noted consistent with multifocal pneumonia. The visualized skeletal structures are unremarkable. IMPRESSION: Slightly improved multifocal pneumonia. Electronically Signed   By: Marijo Conception M.D.   On: 10/22/2018 11:32   Dg Chest Port 1 View  Result Date: 10/10/2018 CLINICAL DATA:  Fever and shortness of breath EXAM: PORTABLE CHEST 1 VIEW COMPARISON:  None. FINDINGS: There are  bilateral streaky airspace opacities, worst in the right upper lobe. Cardiomediastinal contours are normal. No pneumothorax or sizable pleural effusion. IMPRESSION: Bilateral streaky airspace opacities which may indicate multifocal infection. Electronically Signed   By: Ulyses Jarred M.D.   On: 10/10/2018 17:30

## 2018-11-02 LAB — COMPREHENSIVE METABOLIC PANEL
ALT: 43 U/L (ref 0–44)
AST: 15 U/L (ref 15–41)
Albumin: 2.9 g/dL — ABNORMAL LOW (ref 3.5–5.0)
Alkaline Phosphatase: 54 U/L (ref 38–126)
Anion gap: 13 (ref 5–15)
BUN: 51 mg/dL — ABNORMAL HIGH (ref 8–23)
CO2: 31 mmol/L (ref 22–32)
Calcium: 8.7 mg/dL — ABNORMAL LOW (ref 8.9–10.3)
Chloride: 93 mmol/L — ABNORMAL LOW (ref 98–111)
Creatinine, Ser: 0.92 mg/dL (ref 0.44–1.00)
GFR calc Af Amer: >60 mL/min (ref >60)
GFR calc non Af Amer: >60 mL/min (ref >60)
Glucose, Bld: 80 mg/dL (ref 70–99)
Potassium: 3.6 mmol/L (ref 3.5–5.1)
Sodium: 137 mmol/L (ref 135–145)
Total Bilirubin: 0.7 mg/dL (ref 0.3–1.2)
Total Protein: 6.7 g/dL (ref 6.5–8.1)

## 2018-11-02 LAB — CBC WITH DIFFERENTIAL/PLATELET
Abs Immature Granulocytes: 0.06 10*3/uL (ref 0.00–0.07)
Basophils Absolute: 0 10*3/uL (ref 0.0–0.1)
Basophils Relative: 0 %
Eosinophils Absolute: 0.3 10*3/uL (ref 0.0–0.5)
Eosinophils Relative: 3 %
HCT: 42.2 % (ref 36.0–46.0)
Hemoglobin: 13.4 g/dL (ref 12.0–15.0)
Immature Granulocytes: 1 %
Lymphocytes Relative: 7 %
Lymphs Abs: 1 10*3/uL (ref 0.7–4.0)
MCH: 30.5 pg (ref 26.0–34.0)
MCHC: 31.8 g/dL (ref 30.0–36.0)
MCV: 96.1 fL (ref 80.0–100.0)
Monocytes Absolute: 1 10*3/uL (ref 0.1–1.0)
Monocytes Relative: 8 %
Neutro Abs: 10.9 10*3/uL — ABNORMAL HIGH (ref 1.7–7.7)
Neutrophils Relative %: 81 %
Platelets: 180 10*3/uL (ref 150–400)
RBC: 4.39 MIL/uL (ref 3.87–5.11)
RDW: 16.6 % — ABNORMAL HIGH (ref 11.5–15.5)
WBC: 13.3 10*3/uL — ABNORMAL HIGH (ref 4.0–10.5)
nRBC: 0 % (ref 0.0–0.2)

## 2018-11-02 LAB — C-REACTIVE PROTEIN: CRP: 7.1 mg/dL — ABNORMAL HIGH (ref <1.0)

## 2018-11-02 LAB — MAGNESIUM: Magnesium: 2.8 mg/dL — ABNORMAL HIGH (ref 1.7–2.4)

## 2018-11-02 LAB — BRAIN NATRIURETIC PEPTIDE: B Natriuretic Peptide: 45.9 pg/mL (ref 0.0–100.0)

## 2018-11-02 LAB — GLUCOSE, CAPILLARY
Glucose-Capillary: 86 mg/dL (ref 70–99)
Glucose-Capillary: 303 mg/dL — ABNORMAL HIGH (ref 70–99)
Glucose-Capillary: 218 mg/dL — ABNORMAL HIGH (ref 70–99)
Glucose-Capillary: 179 mg/dL — ABNORMAL HIGH (ref 70–99)

## 2018-11-02 MED ORDER — SODIUM CHLORIDE 0.9 % IV BOLUS
500.0000 mL | Freq: Once | INTRAVENOUS | Status: AC
  Administered 2018-11-02: 500 mL via INTRAVENOUS

## 2018-11-02 MED ORDER — ENSURE MAX PROTEIN PO LIQD
11.0000 [oz_av] | Freq: Two times a day (BID) | ORAL | Status: DC
  Administered 2018-11-02 – 2018-11-09 (×14): 11 [oz_av] via ORAL
  Administered 2018-11-09: 237 mL via ORAL
  Administered 2018-11-10 – 2018-11-18 (×17): 11 [oz_av] via ORAL
  Filled 2018-11-02 (×34): qty 330

## 2018-11-02 NOTE — Progress Notes (Signed)
Talked to daughter Mahealani on the phone and updated her on patient conditionj

## 2018-11-02 NOTE — Progress Notes (Signed)
Notified daughter, Sherlyne of progress.  All questions were answered.

## 2018-11-02 NOTE — Progress Notes (Signed)
11/01/18 1741 PT Treatment-Late entry  PT Visit Information  Assistance Needed +2 (if try to amb)  History of Present Illness 68 year old Spanish-speaking female with past medical history of morbid obesity, hypertension and depression  presenting to the emergency room 10/10/18 with 1 week of fever, cough and shortness of breath in the setting of known COVID-19 contacts.  Patient found to be COVID positive as well as hypoxic with bilateral infiltrates on chest x-ray.  Requiring oxygen support of high flow  Precautions  Precautions Fall  Precaution Comments now on 13 L HFNC non heated.  Pain Assessment  Faces Pain Scale 4  Pain Location right shoulder  Pain Descriptors / Indicators Discomfort;Grimacing  Cognition  Behavior During Therapy Anxious  General Comments daughter on phone interpreting, patient appears WFL, followed all directions  Bed Mobility  General bed mobility comments OOB in chair  Transfers  Transfers Sit to/from Stand  Sit to Stand Min assist  General transfer comment stood from recliner x 2. SAO2 from 91% to 85 %to 65% , probe on forehead.  PT - End of Session  Equipment Utilized During Treatment Oxygen-HFNC non heated  Activity Tolerance Treatment limited secondary to medical complications (Comment)desats with activity  Patient left in chair;with call bell/phone within reach  Nurse Communication Mobility status   PT - Assessment/Plan  PT Plan Current plan remains appropriate  PT Visit Diagnosis Difficulty in walking, not elsewhere classified (R26.2)  PT Frequency (ACUTE ONLY) Min 3X/week  Follow Up Recommendations CIR  PT equipment Rolling walker with 5" wheels  AM-PAC PT "6 Clicks" Mobility Outcome Measure (Version 2)  Help needed turning from your back to your side while in a flat bed without using bedrails? 2  Help needed moving from lying on your back to sitting on the side of a flat bed without using bedrails? 2  Help needed moving to and from a bed to a  chair (including a wheelchair)? 2  Help needed standing up from a chair using your arms (e.g., wheelchair or bedside chair)? 2  Help needed to walk in hospital room? 1  Help needed climbing 3-5 steps with a railing?  1  6 Click Score 10  Consider Recommendation of Discharge To: CIR/SNF/LTACH  PT Goal Progression  Progress towards PT goals Progressing toward goals  PT Time Calculation  PT Start Time (ACUTE ONLY) 1512  PT Stop Time (ACUTE ONLY) 1555  PT Time Calculation (min) (ACUTE ONLY) 43 min  PT General Charges  $$ ACUTE PT VISIT 1 Visit  PT Treatments  $Therapeutic Activity 38-52 mins     11/01/18 1741  PT Visit Information  Assistance Needed +2 (if try to amb)  History of Present Illness 68 year old Spanish-speaking female with past medical history of morbid obesity, hypertension and depression  presenting to the emergency room 10/10/18 with 1 week of fever, cough and shortness of breath in the setting of known COVID-19 contacts.  Patient found to be COVID positive as well as hypoxic with bilateral infiltrates on chest x-ray.  Requiring oxygen support of high flow  Precautions  Precautions Fall  Precaution Comments now on 13 L HFNC non heated.  Pain Assessment  Faces Pain Scale 4  Pain Location right shoulder  Pain Descriptors / Indicators Discomfort;Grimacing  Cognition  Behavior During Therapy Anxious  General Comments daughter on phone interpreting, patiet appears WFL, followed all directions  Bed Mobility  General bed mobility comments OOB in chair  Transfers  Transfers Sit to/from Stand  Sit to Stand  Min assist  General transfer comment stood from recliner x 2. SAO2 from 91 to 85 to 65 , probe on forehead.  PT - End of Session  Equipment Utilized During Treatment Oxygen  Activity Tolerance Treatment limited secondary to medical complications (Comment)  Patient left in chair;with call bell/phone within reach  Nurse Communication Mobility status   PT -  Assessment/Plan  PT Plan Current plan remains appropriate  PT Visit Diagnosis Difficulty in walking, not elsewhere classified (R26.2)  PT Frequency (ACUTE ONLY) Min 3X/week  Follow Up Recommendations CIR  PT equipment Rolling walker with 5" wheels  AM-PAC PT "6 Clicks" Mobility Outcome Measure (Version 2)  Help needed turning from your back to your side while in a flat bed without using bedrails? 2  Help needed moving from lying on your back to sitting on the side of a flat bed without using bedrails? 2  Help needed moving to and from a bed to a chair (including a wheelchair)? 2  Help needed standing up from a chair using your arms (e.g., wheelchair or bedside chair)? 2  Help needed to walk in hospital room? 1  Help needed climbing 3-5 steps with a railing?  1  6 Click Score 10  Consider Recommendation of Discharge To: CIR/SNF/LTACH  PT Goal Progression  Progress towards PT goals Progressing toward goals  PT Time Calculation  PT Start Time (ACUTE ONLY) 1512  PT Stop Time (ACUTE ONLY) 1555  PT Time Calculation (min) (ACUTE ONLY) 43 min  PT General Charges  $$ ACUTE PT VISIT 1 Visit  PT Treatments  $Therapeutic Activity 38-52 mins  Blanchard KelchKaren Jackquline Branca PT Acute Rehabilitation Services  Office [phone]

## 2018-11-02 NOTE — Progress Notes (Signed)
Notified MD of oxygen requirements,  Patient on NRB from 15 L HFNC.

## 2018-11-02 NOTE — Progress Notes (Signed)
Inpatient Diabetes Program Recommendations  AACE/ADA: New Consensus Statement on Inpatient Glycemic Control (2015)  Target Ranges:  Prepandial:   less than 140 mg/dL      Peak postprandial:   less than 180 mg/dL (1-2 hours)      Critically ill patients:  140 - 180 mg/dL   Lab Results  Component Value Date   GLUCAP 303 (H) 11/02/2018   HGBA1C 8.1 (H) 10/11/2018    Review of Glycemic Control Results for Christina Clark, Christina Clark (MRN [id number]) as of 11/02/2018 13:35  Ref. Range 11/01/2018 11:16 11/01/2018 16:20 11/01/2018 21:37 11/02/2018 08:04 11/02/2018 11:14  Glucose-Capillary Latest Ref Range: 70 - 99 mg/dL 226 (H) 266 (H) 130 (H) 86 303 (H)  Diabetes history: DM 2 Outpatient Diabetes medications:  None Current orders for Inpatient glycemic control:  Novolog resistant tid with meals  Levemir 20 units q HS Inpatient Diabetes Program Recommendations:   Note low fasting glucose=58 mg/dL.  Please consider reducing Levemir to 14 units q HS.  Consider adding Novolog meal coverage 3 units tid with meals and reduce correction to moderate.   Thanks,  Adah Perl, RN, BC-ADM Inpatient Diabetes Coordinator Pager [phone] (8a-5p)

## 2018-11-02 NOTE — Progress Notes (Signed)
PROGRESS NOTE                                                                                                                                                                                                             Patient Demographics:    Christina Clark, is a 68 y.o. female, DOB - Apr 15, 1951, YPP:[id number]  Outpatient Primary MD for the patient is Patient, No Pcp Per    LOS - 23  Admit date - 10/10/2018    No chief complaint on file.      Brief Narrative  68 year old Spanish-speaking female with past medical history of morbid obesity, hypertension and depression admitted to the hospitalist service on [place] for COVID pneumonia related acute hypoxic respiratory failure. She was started on Remdisivir, steroids and given Actemra x1.   Subjective:   Patient in bed, appears comfortable, denies any headache, no fever, no chest pain or pressure, improving shortness of breath , no abdominal pain. No focal weakness.    Assessment  & Plan :     Acute Hypoxic Resp. Failure due to Acute Covid 19 Viral Pneumonitis during the ongoing 2020 Covid 19 Pandemic - she has already received and has completed her cynical course of Remdisivir, steroids, Plasma and Actemra, only mild improvement so far still tenuous and requiring 15 to 20 L high flow nasal cannula oxygen along with nonrebreather mask, some element of orthopnea as well.  Continue to advance activity, added flutter valve and I-S for pulmonary toiletry encouraged to sit up in chair and daytime.  Inflammatory markers are borderline.  Unfortunately patient continues to be tenuous and hypoxic.  Note since that she has developed a touch of CHF proning will not help her, advised nursing staff to keep the head of the bed propped up at least 90 degrees if she is in bed or preferably sit in chair in the daytime.  Detailed discussion with patient and family including her  daughter and son-in-law were made on 10/25/2018 and 10/26/2018.  They do not want intubation but wants full medical treatment short of that.  This will be continued, they understand that there is a good chance patient might pass away this admission.  If she declines further goal of care will be comfort.    Resumed on trial of IV steroids again 1 week ago as she  continued to be tenuous and no further treatment left along with PRN Lasix for ongoing diuresis.  Thankfully she has now started to improve and now down to 12 L high flow nasal cannula oxygen only from 15 L high flow nasal cannula oxygen +  nonrebreather mask.    Note sitting up in chair and using I-S + Flutter valve has made a huge difference as well.  Continue monitoring Lasix dose start tapering steroids.   Monitor inflammatory markers closely.  COVID-19 Labs  Recent Labs    10/31/18 0320 11/01/18 0300 11/02/18 0505  DDIMER 1.52* 1.08*  --   FERRITIN 236 359*  --   CRP 2.1* 5.8* 7.1*    Lab Results  Component Value Date   SARSCOV2NAA POSITIVE (A) 10/10/2018     Hepatic Function Latest Ref Rng & Units 11/02/2018 11/01/2018 10/31/2018  Total Protein 6.5 - 8.1 g/dL 6.7 7.5 7.1  Albumin 3.5 - 5.0 g/dL 2.9(L) 3.4(L) 3.1(L)  AST 15 - 41 U/L '15 16 17  '$ ALT 0 - 44 U/L 43 41 34  Alk Phosphatase 38 - 126 U/L 54 58 58  Total Bilirubin 0.3 - 1.2 mg/dL 0.7 0.8 1.0        Component Value Date/Time   BNP 45.9 11/02/2018 0505     2.  AKI.  Resolved after hydration.  3.  Essential hypertension.  Blood pressure stable not on any medications currently.  4.  H/O depression.  On home dose Zoloft along with low-dose Xanax and stable.  5.  Morbid obesity BMI 41.  Follow with PCP for weight loss and diet control.   6.  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  Outpatient echocardiogram.  Has been diuresed nonstop for the last 1 week, on 11/02/2018 blood pressure slightly low, will hold IV Lasix today and given normal saline 500 cc bolus.   Avoid proning, reevaluate IV Lasix needed based on clinical assessment daily.  7.  Mild sinus tachycardia with hypotension.  Stable TSH, resolved.  Lab Results  Component Value Date   TSH 2.280 10/24/2018    8.  Hypokalemia.  Replaced and monitor.    9.  Deconditioning.  Encouraged to sit in chair every day, daughter updated on 10/30/2018, patient willing to do, PT requested to assist the patient in doing so, it will be expected for her to get slightly hypoxic from her baseline levels when she is moving.  This should not prohibit PT treatments unless patient gets profoundly symptomatic.    10. DM type II in poor outpatient control due to hyperglycemia.  A1c 8.5.  She was not on any medications at home.  Insulin dose adjusted for tapering steroid dose on 11/01/2018.  CBG stable.  Will give her DM and insulin education.  CBG (last 3)  Recent Labs    11/01/18 1620 11/01/18 2137 11/02/18 0804  GLUCAP 266* 130* 86    Condition - Extremely Guarded  Family Communication  :  daughter over the phone 10/24/18, 10/25/2018, 10/26/18, 10/27/18, 10/29/18, 10/30/18, 10/31/18, 11/01/18, 11/02/18  Code Status : No intubation no CPR.  Diet :   Diet Order            DIET DYS 3 Room service appropriate? Yes; Fluid consistency: Thin  Diet effective now               Disposition Plan  : PCU.  Consults  :  None  Procedures  :  None  PUD Prophylaxis :   DVT Prophylaxis  :  Lovenox   Lab Results  Component Value Date   PLT 180 11/02/2018    Inpatient Medications  Scheduled Meds: . sodium chloride   Intravenous Once  . clonazepam  0.5 mg Oral BID  . enoxaparin (LOVENOX) injection  0.5 mg/kg Subcutaneous Q12H  . insulin aspart  0-20 Units Subcutaneous TID WC  . insulin detemir  20 Units Subcutaneous QHS  . methylPREDNISolone (SOLU-MEDROL) injection  60 mg Intravenous Daily  . Ensure Max Protein  11 oz Oral Daily  . sertraline  50 mg Oral Daily  . sodium chloride flush  10-40 mL Intracatheter  Q12H   Continuous Infusions: . sodium chloride     PRN Meds:.acetaminophen, albuterol, ALPRAZolam, lip balm, menthol-cetylpyridinium, morphine injection, [DISCONTINUED] ondansetron **OR** ondansetron (ZOFRAN) IV, polyethylene glycol, sodium chloride  Antibiotics  :    Anti-infectives (From admission, onward)   Start     Dose/Rate Route Frequency Ordered Stop   10/12/18 0900  remdesivir 100 mg in sodium chloride 0.9 % 230 mL IVPB     100 mg 500 mL/hr over 30 Minutes Intravenous Every 24 hours 10/11/18 0745 10/15/18 0928   10/11/18 0900  remdesivir 200 mg in sodium chloride 0.9 % 210 mL IVPB     200 mg 500 mL/hr over 30 Minutes Intravenous Once 10/11/18 0745 10/11/18 1617       Time Spent in minutes  30   Lala Lund M.D on 11/02/2018 at 10:05 AM  To page go to [email or web address] - password [facility]  [facility] -  Office  [phone]   See all Orders from today for further details    Objective:   Vitals:   11/02/18 0000 11/02/18 0355 11/02/18 0400 11/02/18 0805  BP: (!) 101/59   (!) 70/55  Pulse: 82   74  Resp: (!) 31 (!) 32 (!) 28 (!) 28  Temp: 97.7 F (36.5 C)  97.6 F (36.4 C) 97.7 F (36.5 C)  TempSrc: Oral  Oral Oral  SpO2: (!) 84% 93%  91%  Weight:      Height:        Wt Readings from Last 3 Encounters:  10/10/18 108.7 kg  10/10/18 109 kg     Intake/Output Summary (Last 24 hours) at 11/02/2018 1005 Last data filed at 11/02/2018 0040 Gross per 24 hour  Intake 240 ml  Output 750 ml  Net -510 ml     Physical Exam  Awake Alert, Oriented X 3, No new F.N deficits, Normal affect Bailey.AT,PERRAL Supple Neck,No JVD, No cervical lymphadenopathy appriciated.  Symmetrical Chest wall movement, Good air movement bilaterally, CTAB RRR,No Gallops, Rubs or new Murmurs, No Parasternal Heave +ve B.Sounds, Abd Soft, No tenderness, No organomegaly appriciated, No rebound - guarding or rigidity. No Cyanosis, Clubbing or edema, No new Rash or bruise    Data Review:     CBC  Recent Labs  Lab 10/29/18 0208 10/30/18 0424 10/31/18 0320 11/01/18 0300 11/02/18 0505  WBC 16.8* 17.0* 16.3* 14.6* 13.3*  HGB 13.5 14.1 14.3 15.2* 13.4  HCT 43.5 44.9 44.4 46.6* 42.2  PLT 223 214 190 197 180  MCV 98.6 96.1 97.6 96.9 96.1  MCH 30.6 30.2 31.4 31.6 30.5  MCHC 31.0 31.4 32.2 32.6 31.8  RDW 17.3* 17.1* 17.2* 17.2* 16.6*  LYMPHSABS 0.8 1.9 1.1 1.0 1.0  MONOABS 0.8 1.2* 1.2* 0.9 1.0  EOSABS 0.0 0.2 0.3 0.2 0.3  BASOSABS 0.0 0.0 0.0 0.0 0.0    Chemistries  Recent Labs  Lab 10/29/18 0208 10/30/18 0424 10/31/18  0320 11/01/18 0300 11/02/18 0505  NA 140 138 140 137 137  K 4.2 3.4* 4.8 3.9 3.6  CL 95* 93* 97* 93* 93*  CO2 '30 31 26 27 31  '$ GLUCOSE 91 88 47* 58* 80  BUN 48* 52* 47* 55* 51*  CREATININE 0.92 0.94 0.87 0.96 0.92  CALCIUM 8.9 9.2 9.3 9.2 8.7*  MG 2.9* 2.8* 2.8* 2.9* 2.8*  AST 12* 13* '17 16 15  '$ ALT 24 29 34 41 43  ALKPHOS 72 68 58 58 54  BILITOT 0.8 0.7 1.0 0.8 0.7   ------------------------------------------------------------------------------------------------------------------ No results for input(s): CHOL, HDL, LDLCALC, TRIG, CHOLHDL, LDLDIRECT in the last 72 hours.  Lab Results  Component Value Date   HGBA1C 8.1 (H) 10/11/2018   ------------------------------------------------------------------------------------------------------------------ No results for input(s): TSH, T4TOTAL, T3FREE, THYROIDAB in the last 72 hours.  Invalid input(s): FREET3  Cardiac Enzymes No results for input(s): CKMB, TROPONINI, MYOGLOBIN in the last 168 hours.  Invalid input(s): CK ------------------------------------------------------------------------------------------------------------------    Component Value Date/Time   BNP 45.9 11/02/2018 0505    Micro Results Recent Results (from the past 240 hour(s))  Culture, Urine     Status: Abnormal   Collection Time: 10/24/18  7:21 AM   Specimen: Urine, Clean Catch  Result Value Ref Range Status    Specimen Description   Final    URINE, CLEAN CATCH Performed at [facility], [place] [address] [place], [place] [address]    Special Requests   Final    NONE Performed at [facility], [place] [address], [place], [place] [address]    Culture (A)  Final    <10,000 COLONIES/mL INSIGNIFICANT GROWTH Performed at [place] [address] [place],  [address]    Report Status 10/25/2018 FINAL  Final    Radiology Reports Dg Chest Port 1 View  Result Date: 10/26/2018 CLINICAL DATA:  Acute respiratory disease.  Hypoxia. EXAM: PORTABLE CHEST 1 VIEW COMPARISON:  10/24/2018 FINDINGS: Diffuse bilateral interstitial and alveolar airspace opacities. No significant interval change compared with the prior exam. No pleural effusion or pneumothorax. Stable cardiomediastinal silhouette. No aggressive osseous lesion. IMPRESSION: Stable diffuse bilateral interstitial and alveolar airspace disease most consistent with multilobar pneumonia including atypical pneumonia. Electronically Signed   By: Kathreen Devoid   On: 10/26/2018 09:39   Dg Chest Port 1 View  Result Date: 10/24/2018 CLINICAL DATA:  Order for SOB  Hx of HTN EXAM: PORTABLE CHEST - 1 VIEW COMPARISON:  10/22/2018 FINDINGS: Progression of bilateral diffuse coarse airspace opacities with relative sparing of the apices. Heart size and mediastinal contours are within normal limits. Aortic Atherosclerosis (ICD10-170.0). No effusion. Visualized bones unremarkable. IMPRESSION: Worsening bilateral infiltrates or edema. Electronically Signed   By: Lucrezia Europe M.D.   On: 10/24/2018 08:55   Dg Chest Port 1 View  Result Date: 10/22/2018 CLINICAL DATA:  Pneumonia. EXAM: PORTABLE CHEST 1 VIEW COMPARISON:  Radiograph of October 10, 2018. FINDINGS: The heart size and mediastinal contours are within normal limits. No pneumothorax or pleural effusion is noted. Slightly improved bilateral lung opacities are noted consistent  with multifocal pneumonia. The visualized skeletal structures are unremarkable. IMPRESSION: Slightly improved multifocal pneumonia. Electronically Signed   By: Marijo Conception M.D.   On: 10/22/2018 11:32   Dg Chest Port 1 View  Result Date: 10/10/2018 CLINICAL DATA:  Fever and shortness of breath EXAM: PORTABLE CHEST 1 VIEW COMPARISON:  None. FINDINGS: There are bilateral streaky airspace opacities, worst in the right upper  lobe. Cardiomediastinal contours are normal. No pneumothorax or sizable pleural effusion. IMPRESSION: Bilateral streaky airspace opacities which may indicate multifocal infection. Electronically Signed   By: Ulyses Jarred M.D.   On: 10/10/2018 17:30

## 2018-11-02 NOTE — Progress Notes (Signed)
Nutrition Follow-up RD working remotely.  DOCUMENTATION CODES:   Morbid obesity  INTERVENTION:   Increase Ensure Max to BID, each supplement provides 150 kcal and 30 grams of protein.   Continue Hormel Shake daily with Breakfast which provides 520 kcals and 22 g of protein and Magic cup BID with lunch and dinner, each supplement provides 290 kcal and 9 grams of protein, automatically on meal trays to optimize nutritional intake.   NUTRITION DIAGNOSIS:   Increased nutrient needs related to acute illness as evidenced by estimated needs.  Ongoing  GOAL:   Patient will meet greater than or equal to 90% of their needs  Progressing  MONITOR:   PO intake, Supplement acceptance, Labs  REASON FOR ASSESSMENT:   Consult Assessment of nutrition requirement/status  ASSESSMENT:   68 yo Spanish speaking female admitted with COVID PNA. PMH of morbid obesity, HTN, depression.  Patient is improving, but still requiring 12 L HFNC. Intake of meals remains poor, only consuming 0-50% of meals. She is receiving Ensure Max once daily.   Labs reviewed.  CBG's: 266-130-86-303 Medications reviewed.  No BM documented since 6/28. Constipation may be affecting appetite and intake.   Diet Order:   Diet Order            DIET DYS 3 Room service appropriate? Yes; Fluid consistency: Thin  Diet effective now              EDUCATION NEEDS:   Not appropriate for education at this time  Skin:  Skin Assessment: Reviewed RN Assessment  Last BM:  6/28  Height:   Ht Readings from Last 1 Encounters:  10/10/18 5\' 4"  (1.626 m)    Weight:   Wt Readings from Last 1 Encounters:  10/10/18 108.7 kg    Ideal Body Weight:  54.5 kg  BMI:  Body mass index is 41.13 kg/m.  Estimated Nutritional Needs:   Kcal:  6761-9509  Protein:  100-125 gm  Fluid:  1.8 L    Molli Barrows, RD, LDN, CNSC Pager [phone] After Hours Pager [phone]

## 2018-11-03 ENCOUNTER — Inpatient Hospital Stay (HOSPITAL_COMMUNITY): Payer: Medicaid Other

## 2018-11-03 LAB — CBC WITH DIFFERENTIAL/PLATELET
Abs Immature Granulocytes: 0.05 10*3/uL (ref 0.00–0.07)
Basophils Absolute: 0 10*3/uL (ref 0.0–0.1)
Basophils Relative: 0 %
Eosinophils Absolute: 0.3 10*3/uL (ref 0.0–0.5)
Eosinophils Relative: 2 %
HCT: 41.5 % (ref 36.0–46.0)
Hemoglobin: 13 g/dL (ref 12.0–15.0)
Immature Granulocytes: 0 %
Lymphocytes Relative: 9 %
Lymphs Abs: 1.2 10*3/uL (ref 0.7–4.0)
MCH: 30.4 pg (ref 26.0–34.0)
MCHC: 31.3 g/dL (ref 30.0–36.0)
MCV: 97.2 fL (ref 80.0–100.0)
Monocytes Absolute: 1.1 10*3/uL — ABNORMAL HIGH (ref 0.1–1.0)
Monocytes Relative: 8 %
Neutro Abs: 10.3 10*3/uL — ABNORMAL HIGH (ref 1.7–7.7)
Neutrophils Relative %: 81 %
Platelets: 222 10*3/uL (ref 150–400)
RBC: 4.27 MIL/uL (ref 3.87–5.11)
RDW: 16.7 % — ABNORMAL HIGH (ref 11.5–15.5)
WBC: 13 10*3/uL — ABNORMAL HIGH (ref 4.0–10.5)
nRBC: 0 % (ref 0.0–0.2)

## 2018-11-03 LAB — GLUCOSE, CAPILLARY
Glucose-Capillary: 123 mg/dL — ABNORMAL HIGH (ref 70–99)
Glucose-Capillary: 277 mg/dL — ABNORMAL HIGH (ref 70–99)
Glucose-Capillary: 252 mg/dL — ABNORMAL HIGH (ref 70–99)
Glucose-Capillary: 99 mg/dL (ref 70–99)

## 2018-11-03 LAB — COMPREHENSIVE METABOLIC PANEL
ALT: 39 U/L (ref 0–44)
AST: 14 U/L — ABNORMAL LOW (ref 15–41)
Albumin: 2.9 g/dL — ABNORMAL LOW (ref 3.5–5.0)
Alkaline Phosphatase: 55 U/L (ref 38–126)
Anion gap: 12 (ref 5–15)
BUN: 44 mg/dL — ABNORMAL HIGH (ref 8–23)
CO2: 30 mmol/L (ref 22–32)
Calcium: 8.8 mg/dL — ABNORMAL LOW (ref 8.9–10.3)
Chloride: 94 mmol/L — ABNORMAL LOW (ref 98–111)
Creatinine, Ser: 0.77 mg/dL (ref 0.44–1.00)
GFR calc Af Amer: >60 mL/min (ref >60)
GFR calc non Af Amer: >60 mL/min (ref >60)
Glucose, Bld: 97 mg/dL (ref 70–99)
Potassium: 3.9 mmol/L (ref 3.5–5.1)
Sodium: 136 mmol/L (ref 135–145)
Total Bilirubin: 0.7 mg/dL (ref 0.3–1.2)
Total Protein: 7 g/dL (ref 6.5–8.1)

## 2018-11-03 LAB — FERRITIN: Ferritin: 348 ng/mL — ABNORMAL HIGH (ref 11–307)

## 2018-11-03 LAB — C-REACTIVE PROTEIN: CRP: 7.5 mg/dL — ABNORMAL HIGH (ref <1.0)

## 2018-11-03 LAB — MAGNESIUM: Magnesium: 2.9 mg/dL — ABNORMAL HIGH (ref 1.7–2.4)

## 2018-11-03 LAB — D-DIMER, QUANTITATIVE: D-Dimer, Quant: 0.88 ug/mL-FEU — ABNORMAL HIGH (ref 0.00–0.50)

## 2018-11-03 LAB — BRAIN NATRIURETIC PEPTIDE: B Natriuretic Peptide: 68.3 pg/mL (ref 0.0–100.0)

## 2018-11-03 LAB — LACTATE DEHYDROGENASE: LDH: 404 U/L — ABNORMAL HIGH (ref 98–192)

## 2018-11-03 NOTE — Progress Notes (Signed)
Pt up in bed to eat breakfast. Pt bed laid flat to pull her up. Pt sat up ASAP and desat to 81% on NRB. Pt able to eat by moving the mask to the side and replacing after taking a bite. Pt is alert and oriented, conversing with her daughter on the phone. Pt respirations 20-25, shallow. Pt able to talk in short sentences

## 2018-11-03 NOTE — Progress Notes (Signed)
Bedside shift change report given. Pt breathing short, shallow breaths on 15L NRB. Pt still appears dusky. Per shift report pt had restless night. Appears to be sleepy now. Per night shift RN MD was notified of changes overnight. Will notify MD this AM.

## 2018-11-03 NOTE — Progress Notes (Signed)
Notified MD of patient desaturation to 70% and is slow to recover.  sats remain 87-88%, patient is dusky and WOB increased.

## 2018-11-03 NOTE — Progress Notes (Signed)
Pt's daughter updated via phone in the room.

## 2018-11-03 NOTE — Progress Notes (Signed)
PROGRESS NOTE                                                                                                                                                                                                             Patient Demographics:    Christina Clark, is a 68 y.o. female, DOB - March 27, 1951, ZOX:[id number]  Outpatient Primary MD for the patient is Patient, No Pcp Per    LOS - 24  No chief complaint on file.      Brief Narrative: Patient is a 68 y.o. female with PMHx of morbid obesity, HTN, depression-presenting with acute hypoxemic respiratory failure secondary to COVID-19 pneumonia.  She was treated with Remdesivir, Actemra and steroids-hospital course unfortunately has been prolonged and complicated by persistent hypoxemia.  See below for further details.    Subjective:    Christina Clark today appears comfortable-denies any chest pain.  No nausea or vomiting.   Assessment  & Plan :   Acute hypoxemic respiratory failure secondary to COVID-19 pneumonia: Remains on high flow O2-has completed maximal medical treatment with Remdesivir, Actemra, plasma-remains on IV Solu-Medrol.  Inflammatory markers are mildly elevated but otherwise stable.   COVID-19 Labs:  Recent Labs    11/01/18 0300 11/02/18 0505 11/03/18 0446  DDIMER 1.08*  --  0.88*  FERRITIN 359*  --  348*  LDH  --   --  404*  CRP 5.8* 7.1* 7.5*    Lab Results  Component Value Date   SARSCOV2NAA POSITIVE (A) 10/10/2018    Acute on chronic diastolic heart failure: No signs of volume overload-hold Lasix-continue to keep in negative balance.  HTN: Stable-not on any antihypertensives  DM-2 (A1c 8.1): CBGs stable-continue Levemir 20 units nightly and SSI  Depression with anxiety: Stable-continue Zoloft and Xanax  Vent Settings: FiO2 (%):  [100 %] 100 %  ABG:    Component Value Date/Time   PHART 7.52 (H) 10/10/2018 1647   PCO2ART  34 10/10/2018 1647   PO2ART 53 (L) 10/10/2018 1647   HCO3 27.8 10/10/2018 1647   O2SAT 90.6 10/10/2018 1647    Condition - Extremely Guarded  Family Communication  :  Daughter updated over the phone  Code Status : DNI-no CPR  Diet :  Diet Order            DIET DYS 3 Room service  appropriate? Yes; Fluid consistency: Thin  Diet effective now               Disposition Plan  :  Remain inpatient  Consults  :  None  Procedures  : NONE  DVT Prophylaxis  :  Lovenox twice daily dosing  Lab Results  Component Value Date   PLT 222 11/03/2018    Inpatient Medications  Scheduled Meds:  sodium chloride   Intravenous Once   clonazepam  0.5 mg Oral BID   enoxaparin (LOVENOX) injection  0.5 mg/kg Subcutaneous Q12H   insulin aspart  0-20 Units Subcutaneous TID WC   insulin detemir  20 Units Subcutaneous QHS   methylPREDNISolone (SOLU-MEDROL) injection  60 mg Intravenous Daily   Ensure Max Protein  11 oz Oral BID   sertraline  50 mg Oral Daily   sodium chloride flush  10-40 mL Intracatheter Q12H   Continuous Infusions: PRN Meds:.acetaminophen, albuterol, ALPRAZolam, lip balm, menthol-cetylpyridinium, morphine injection, [DISCONTINUED] ondansetron **OR** ondansetron (ZOFRAN) IV, polyethylene glycol, sodium chloride  Antibiotics  :    Anti-infectives (From admission, onward)   Start     Dose/Rate Route Frequency Ordered Stop   10/12/18 0900  remdesivir 100 mg in sodium chloride 0.9 % 230 mL IVPB     100 mg 500 mL/hr over 30 Minutes Intravenous Every 24 hours 10/11/18 0745 10/15/18 0928   10/11/18 0900  remdesivir 200 mg in sodium chloride 0.9 % 210 mL IVPB     200 mg 500 mL/hr over 30 Minutes Intravenous Once 10/11/18 0745 10/11/18 1617       Time Spent in minutes  25   Jeoffrey MassedShanker Verdie Barrows M.D on 11/03/2018 at 3:44 PM  To page go to [email or web address] - use universal password  [facility] -  Office  [phone]  See all Orders from today for further  details   Admit date - 10/10/2018    24    Objective:   Vitals:   11/03/18 0739 11/03/18 0800 11/03/18 0907 11/03/18 1207  BP:  (!) 109/59  116/67  Pulse:  73  (!) 102  Resp:      Temp: (!) 97.1 F (36.2 C)   97.9 F (36.6 C)  TempSrc: Axillary   Axillary  SpO2:  93% (!) 88% (!) 87%  Weight:      Height:        Wt Readings from Last 3 Encounters:  11/03/18 97.6 kg  10/10/18 109 kg     Intake/Output Summary (Last 24 hours) at 11/03/2018 1544 Last data filed at 11/02/2018 2200 Gross per 24 hour  Intake 240 ml  Output 500 ml  Net -260 ml     Physical Exam Gen Exam:Alert awake-not in any distress HEENT:atraumatic, normocephalic Chest: B/L clear to auscultation anteriorly CVS:S1S2 regular Abdomen:soft non tender, non distended Extremities:no edema Neurology:NON Focal Skin: no rash   Data Review:    CBC Recent Labs  Lab 10/30/18 0424 10/31/18 0320 11/01/18 0300 11/02/18 0505 11/03/18 0446  WBC 17.0* 16.3* 14.6* 13.3* 13.0*  HGB 14.1 14.3 15.2* 13.4 13.0  HCT 44.9 44.4 46.6* 42.2 41.5  PLT 214 190 197 180 222  MCV 96.1 97.6 96.9 96.1 97.2  MCH 30.2 31.4 31.6 30.5 30.4  MCHC 31.4 32.2 32.6 31.8 31.3  RDW 17.1* 17.2* 17.2* 16.6* 16.7*  LYMPHSABS 1.9 1.1 1.0 1.0 1.2  MONOABS 1.2* 1.2* 0.9 1.0 1.1*  EOSABS 0.2 0.3 0.2 0.3 0.3  BASOSABS 0.0 0.0 0.0 0.0 0.0    Chemistries  Recent  Labs  Lab 10/30/18 0424 10/31/18 0320 11/01/18 0300 11/02/18 0505 11/03/18 0446  NA 138 140 137 137 136  K 3.4* 4.8 3.9 3.6 3.9  CL 93* 97* 93* 93* 94*  CO2 31 26 27 31 30   GLUCOSE 88 47* 58* 80 97  BUN 52* 47* 55* 51* 44*  CREATININE 0.94 0.87 0.96 0.92 0.77  CALCIUM 9.2 9.3 9.2 8.7* 8.8*  MG 2.8* 2.8* 2.9* 2.8* 2.9*  AST 13* 17 16 15  14*  ALT 29 34 41 43 39  ALKPHOS 68 58 58 54 55  BILITOT 0.7 1.0 0.8 0.7 0.7   ------------------------------------------------------------------------------------------------------------------ No results for input(s): CHOL, HDL,  LDLCALC, TRIG, CHOLHDL, LDLDIRECT in the last 72 hours.  Lab Results  Component Value Date   HGBA1C 8.1 (H) 10/11/2018   ------------------------------------------------------------------------------------------------------------------ No results for input(s): TSH, T4TOTAL, T3FREE, THYROIDAB in the last 72 hours.  Invalid input(s): FREET3 ------------------------------------------------------------------------------------------------------------------ Recent Labs    11/01/18 0300 11/03/18 0446  FERRITIN 359* 348*    Coagulation profile No results for input(s): INR, PROTIME in the last 168 hours.  Recent Labs    11/01/18 0300 11/03/18 0446  DDIMER 1.08* 0.88*    Cardiac Enzymes No results for input(s): CKMB, TROPONINI, MYOGLOBIN in the last 168 hours.  Invalid input(s): CK ------------------------------------------------------------------------------------------------------------------    Component Value Date/Time   BNP 68.3 11/03/2018 0446    Micro Results No results found for this or any previous visit (from the past 240 hour(s)).  Radiology Reports Dg Chest Port 1 View  Result Date: 11/03/2018 CLINICAL DATA:  Tachycardia.  Coronavirus infection. EXAM: PORTABLE CHEST 1 VIEW COMPARISON:  10/26/2018 FINDINGS: Widespread patchy and hazy pulmonary infiltrates persist, but are improved compared to the last exam. No worsening or new findings. No effusions. IMPRESSION: Persistent widespread patchy and hazy pulmonary infiltrates consistent with viral pneumonia. Infiltrates are less dense than were seen on the prior exam. Electronically Signed   By: Nelson Chimes M.D.   On: 11/03/2018 11:43   Dg Chest Port 1 View  Result Date: 10/26/2018 CLINICAL DATA:  Acute respiratory disease.  Hypoxia. EXAM: PORTABLE CHEST 1 VIEW COMPARISON:  10/24/2018 FINDINGS: Diffuse bilateral interstitial and alveolar airspace opacities. No significant interval change compared with the prior exam. No  pleural effusion or pneumothorax. Stable cardiomediastinal silhouette. No aggressive osseous lesion. IMPRESSION: Stable diffuse bilateral interstitial and alveolar airspace disease most consistent with multilobar pneumonia including atypical pneumonia. Electronically Signed   By: Kathreen Devoid   On: 10/26/2018 09:39   Dg Chest Port 1 View  Result Date: 10/24/2018 CLINICAL DATA:  Order for SOB  Hx of HTN EXAM: PORTABLE CHEST - 1 VIEW COMPARISON:  10/22/2018 FINDINGS: Progression of bilateral diffuse coarse airspace opacities with relative sparing of the apices. Heart size and mediastinal contours are within normal limits. Aortic Atherosclerosis (ICD10-170.0). No effusion. Visualized bones unremarkable. IMPRESSION: Worsening bilateral infiltrates or edema. Electronically Signed   By: Lucrezia Europe M.D.   On: 10/24/2018 08:55   Dg Chest Port 1 View  Result Date: 10/22/2018 CLINICAL DATA:  Pneumonia. EXAM: PORTABLE CHEST 1 VIEW COMPARISON:  Radiograph of October 10, 2018. FINDINGS: The heart size and mediastinal contours are within normal limits. No pneumothorax or pleural effusion is noted. Slightly improved bilateral lung opacities are noted consistent with multifocal pneumonia. The visualized skeletal structures are unremarkable. IMPRESSION: Slightly improved multifocal pneumonia. Electronically Signed   By: Marijo Conception M.D.   On: 10/22/2018 11:32   Dg Chest Port 1 View  Result Date:  10/10/2018 CLINICAL DATA:  Fever and shortness of breath EXAM: PORTABLE CHEST 1 VIEW COMPARISON:  None. FINDINGS: There are bilateral streaky airspace opacities, worst in the right upper lobe. Cardiomediastinal contours are normal. No pneumothorax or sizable pleural effusion. IMPRESSION: Bilateral streaky airspace opacities which may indicate multifocal infection. Electronically Signed   By: Deatra RobinsonKevin  Herman M.D.   On: 10/10/2018 17:30

## 2018-11-04 ENCOUNTER — Inpatient Hospital Stay (HOSPITAL_COMMUNITY): Payer: Medicaid Other

## 2018-11-04 LAB — CBC WITH DIFFERENTIAL/PLATELET
Abs Immature Granulocytes: 0.05 10*3/uL (ref 0.00–0.07)
Basophils Absolute: 0 10*3/uL (ref 0.0–0.1)
Basophils Relative: 0 %
Eosinophils Absolute: 0.8 10*3/uL — ABNORMAL HIGH (ref 0.0–0.5)
Eosinophils Relative: 7 %
HCT: 40.8 % (ref 36.0–46.0)
Hemoglobin: 12.9 g/dL (ref 12.0–15.0)
Immature Granulocytes: 1 %
Lymphocytes Relative: 12 %
Lymphs Abs: 1.3 10*3/uL (ref 0.7–4.0)
MCH: 31 pg (ref 26.0–34.0)
MCHC: 31.6 g/dL (ref 30.0–36.0)
MCV: 98.1 fL (ref 80.0–100.0)
Monocytes Absolute: 1 10*3/uL (ref 0.1–1.0)
Monocytes Relative: 10 %
Neutro Abs: 7.7 10*3/uL (ref 1.7–7.7)
Neutrophils Relative %: 70 %
Platelets: 232 10*3/uL (ref 150–400)
RBC: 4.16 MIL/uL (ref 3.87–5.11)
RDW: 16.6 % — ABNORMAL HIGH (ref 11.5–15.5)
WBC: 10.8 10*3/uL — ABNORMAL HIGH (ref 4.0–10.5)
nRBC: 0 % (ref 0.0–0.2)

## 2018-11-04 LAB — FERRITIN: Ferritin: 329 ng/mL — ABNORMAL HIGH (ref 11–307)

## 2018-11-04 LAB — COMPREHENSIVE METABOLIC PANEL
ALT: 37 U/L (ref 0–44)
AST: 12 U/L — ABNORMAL LOW (ref 15–41)
Albumin: 2.8 g/dL — ABNORMAL LOW (ref 3.5–5.0)
Alkaline Phosphatase: 48 U/L (ref 38–126)
Anion gap: 12 (ref 5–15)
BUN: 32 mg/dL — ABNORMAL HIGH (ref 8–23)
CO2: 29 mmol/L (ref 22–32)
Calcium: 8.8 mg/dL — ABNORMAL LOW (ref 8.9–10.3)
Chloride: 98 mmol/L (ref 98–111)
Creatinine, Ser: 0.71 mg/dL (ref 0.44–1.00)
GFR calc Af Amer: >60 mL/min (ref >60)
GFR calc non Af Amer: >60 mL/min (ref >60)
Glucose, Bld: 62 mg/dL — ABNORMAL LOW (ref 70–99)
Potassium: 4.1 mmol/L (ref 3.5–5.1)
Sodium: 139 mmol/L (ref 135–145)
Total Bilirubin: 0.8 mg/dL (ref 0.3–1.2)
Total Protein: 6.7 g/dL (ref 6.5–8.1)

## 2018-11-04 LAB — C-REACTIVE PROTEIN: CRP: 4.5 mg/dL — ABNORMAL HIGH (ref <1.0)

## 2018-11-04 LAB — GLUCOSE, CAPILLARY
Glucose-Capillary: 123 mg/dL — ABNORMAL HIGH (ref 70–99)
Glucose-Capillary: 223 mg/dL — ABNORMAL HIGH (ref 70–99)
Glucose-Capillary: 269 mg/dL — ABNORMAL HIGH (ref 70–99)
Glucose-Capillary: 177 mg/dL — ABNORMAL HIGH (ref 70–99)

## 2018-11-04 LAB — MAGNESIUM: Magnesium: 2.6 mg/dL — ABNORMAL HIGH (ref 1.7–2.4)

## 2018-11-04 LAB — LACTATE DEHYDROGENASE: LDH: 358 U/L — ABNORMAL HIGH (ref 98–192)

## 2018-11-04 LAB — D-DIMER, QUANTITATIVE: D-Dimer, Quant: 0.66 ug/mL-FEU — ABNORMAL HIGH (ref 0.00–0.50)

## 2018-11-04 LAB — BRAIN NATRIURETIC PEPTIDE: B Natriuretic Peptide: 57.2 pg/mL (ref 0.0–100.0)

## 2018-11-04 MED ORDER — ALUM & MAG HYDROXIDE-SIMETH 200-200-20 MG/5ML PO SUSP: 30.0000 mL | Freq: Four times a day (QID) | ORAL | Status: DC | PRN

## 2018-11-04 MED ORDER — PANTOPRAZOLE SODIUM 40 MG PO TBEC
40.0000 mg | DELAYED_RELEASE_TABLET | Freq: Every day | ORAL | Status: DC
  Administered 2018-11-04 – 2018-11-18 (×15): 40 mg via ORAL
  Filled 2018-11-04 (×16): qty 1

## 2018-11-04 NOTE — Progress Notes (Signed)
Spoke with pt's daughter via telephone in pt room. Given updates to care.  Pt also able to stand and take 2 steps to [facility] with desat to 78%. On this RN's previous shift pt would desat to mid 50% O2. Pt still has NRB in place but is tolerating movement better. Pt states she feels improved.

## 2018-11-04 NOTE — Progress Notes (Addendum)
Pt able to stand and balance herself to step to the [facility]. Pt doing much better today with desatting. Pt desatting into low 80's at this time with exertion.

## 2018-11-04 NOTE — Progress Notes (Signed)
PROGRESS NOTE                                                                                                                                                                                                             Patient Demographics:    Christina Clark, is a 68 y.o. female, DOB - [birth year], ZOX:[id number]  Outpatient Primary MD for the patient is Patient, No Pcp Per    LOS - 25  No chief complaint on file.      Brief Narrative: Patient is a 68 y.o. female with PMHx of morbid obesity, HTN, depression-presenting with acute hypoxemic respiratory failure secondary to COVID-19 pneumonia.  She was treated with Remdesivir, Actemra and steroids-hospital course unfortunately has been prolonged and complicated by persistent hypoxemia.  See below for further details.    Subjective:    Christina Clark today appears comfortable-denies any chest pain.  No nausea or vomiting.   Assessment  & Plan :   Acute hypoxemic respiratory failure secondary to COVID-19 pneumonia: Remains on high flow O2-has completed maximal medical treatment with Remdesivir, Actemra, plasma-remains on IV Solu-Medrol.  Inflammatory markers are mildly elevated but otherwise stable.   COVID-19 Labs:  Recent Labs    11/02/18 0505 11/03/18 0446 11/04/18 0500  DDIMER  --  0.88* 0.66*  FERRITIN  --  348* 329*  LDH  --  404* 358*  CRP 7.1* 7.5* 4.5*    Lab Results  Component Value Date   SARSCOV2NAA POSITIVE (A) 10/10/2018     Acute on chronic diastolic heart failure: No signs of volume overload-hold Lasix-continue to keep in negative balance.  HTN: Stable-not on any antihypertensives  DM-2 (A1c 8.1): CBGs stable-continue Levemir 20 units nightly and SSI  Depression with anxiety: Stable-continue Zoloft and Xanax  Vent Settings:    ABG:    Component Value Date/Time   PHART 7.52 (H) 10/10/2018 1647   PCO2ART 34 10/10/2018 1647    PO2ART 53 (L) 10/10/2018 1647   HCO3 27.8 10/10/2018 1647   O2SAT 90.6 10/10/2018 1647    Condition - Extremely Guarded  Family Communication  :  Daughter updated over the phone on 7/5  Code Status : DNI-no CPR  Diet :  Diet Order            DIET DYS 3 Room service appropriate? Yes; Fluid consistency:  Thin  Diet effective now               Disposition Plan  :  Remain inpatient  Consults  :  None  Procedures  : NONE  DVT Prophylaxis  :  Lovenox twice daily dosing  Lab Results  Component Value Date   PLT 232 11/04/2018    Inpatient Medications  Scheduled Meds: . sodium chloride   Intravenous Once  . clonazepam  0.5 mg Oral BID  . enoxaparin (LOVENOX) injection  0.5 mg/kg Subcutaneous Q12H  . insulin aspart  0-20 Units Subcutaneous TID WC  . insulin detemir  20 Units Subcutaneous QHS  . methylPREDNISolone (SOLU-MEDROL) injection  60 mg Intravenous Daily  . Ensure Max Protein  11 oz Oral BID  . sertraline  50 mg Oral Daily  . sodium chloride flush  10-40 mL Intracatheter Q12H   Continuous Infusions: PRN Meds:.acetaminophen, albuterol, ALPRAZolam, lip balm, menthol-cetylpyridinium, morphine injection, [DISCONTINUED] ondansetron **OR** ondansetron (ZOFRAN) IV, polyethylene glycol, sodium chloride  Antibiotics  :    Anti-infectives (From admission, onward)   Start     Dose/Rate Route Frequency Ordered Stop   10/12/18 0900  remdesivir 100 mg in sodium chloride 0.9 % 230 mL IVPB     100 mg 500 mL/hr over 30 Minutes Intravenous Every 24 hours 10/11/18 0745 10/15/18 0928   10/11/18 0900  remdesivir 200 mg in sodium chloride 0.9 % 210 mL IVPB     200 mg 500 mL/hr over 30 Minutes Intravenous Once 10/11/18 0745 10/11/18 1617       Time Spent in minutes  25   Jeoffrey MassedShanker Suraya Vidrine M.D on 11/04/2018 at 4:09 PM  To page go to [email or web address] - use universal password  [facility] -  Office  [phone]  See all Orders from today for further details   Admit  date - 10/10/2018    25    Objective:   Vitals:   11/03/18 1630 11/03/18 1950 11/04/18 0327 11/04/18 0728  BP:  133/66  118/67  Pulse:  90  94  Resp:    (!) 28  Temp: 98.2 F (36.8 C) 98.4 F (36.9 C) 98.2 F (36.8 C)   TempSrc: Oral Oral Oral   SpO2:  95%  91%  Weight:      Height:        Wt Readings from Last 3 Encounters:  11/03/18 97.6 kg  10/10/18 109 kg     Intake/Output Summary (Last 24 hours) at 11/04/2018 1609 Last data filed at 11/04/2018 0300 Gross per 24 hour  Intake 120 ml  Output -  Net 120 ml     Physical Exam General appearance:Awake, alert, not in any distress.  Eyes:no scleral icterus. HEENT: Atraumatic and Normocephalic Neck: supple, no JVD. Resp:Good air entry bilaterally, bibasilar rales CVS: S1 S2 regular, no murmurs.  GI: Bowel sounds present, Non tender and not distended with no gaurding, rigidity or rebound. Extremities: B/L Lower Ext shows no edema, both legs are warm to touch Neurology:  Non focal Psychiatric: Normal judgment and insight. Normal mood. Musculoskeletal:No digital cyanosis Skin:No Rash, warm and dry Wounds:N/A   Data Review:    CBC Recent Labs  Lab 10/31/18 0320 11/01/18 0300 11/02/18 0505 11/03/18 0446 11/04/18 0500  WBC 16.3* 14.6* 13.3* 13.0* 10.8*  HGB 14.3 15.2* 13.4 13.0 12.9  HCT 44.4 46.6* 42.2 41.5 40.8  PLT 190 197 180 222 232  MCV 97.6 96.9 96.1 97.2 98.1  MCH 31.4 31.6 30.5 30.4 31.0  MCHC  32.2 32.6 31.8 31.3 31.6  RDW 17.2* 17.2* 16.6* 16.7* 16.6*  LYMPHSABS 1.1 1.0 1.0 1.2 1.3  MONOABS 1.2* 0.9 1.0 1.1* 1.0  EOSABS 0.3 0.2 0.3 0.3 0.8*  BASOSABS 0.0 0.0 0.0 0.0 0.0    Chemistries  Recent Labs  Lab 10/31/18 0320 11/01/18 0300 11/02/18 0505 11/03/18 0446 11/04/18 0500  NA 140 137 137 136 139  K 4.8 3.9 3.6 3.9 4.1  CL 97* 93* 93* 94* 98  CO2 26 27 31 30 29   GLUCOSE 47* 58* 80 97 62*  BUN 47* 55* 51* 44* 32*  CREATININE 0.87 0.96 0.92 0.77 0.71  CALCIUM 9.3 9.2 8.7* 8.8* 8.8*  MG  2.8* 2.9* 2.8* 2.9* 2.6*  AST 17 16 15  14* 12*  ALT 34 41 43 39 37  ALKPHOS 58 58 54 55 48  BILITOT 1.0 0.8 0.7 0.7 0.8   ------------------------------------------------------------------------------------------------------------------ No results for input(s): CHOL, HDL, LDLCALC, TRIG, CHOLHDL, LDLDIRECT in the last 72 hours.  Lab Results  Component Value Date   HGBA1C 8.1 (H) 10/11/2018   ------------------------------------------------------------------------------------------------------------------ No results for input(s): TSH, T4TOTAL, T3FREE, THYROIDAB in the last 72 hours.  Invalid input(s): FREET3 ------------------------------------------------------------------------------------------------------------------ Recent Labs    11/03/18 0446 11/04/18 0500  FERRITIN 348* 329*    Coagulation profile No results for input(s): INR, PROTIME in the last 168 hours.  Recent Labs    11/03/18 0446 11/04/18 0500  DDIMER 0.88* 0.66*    Cardiac Enzymes No results for input(s): CKMB, TROPONINI, MYOGLOBIN in the last 168 hours.  Invalid input(s): CK ------------------------------------------------------------------------------------------------------------------    Component Value Date/Time   BNP 57.2 11/04/2018 0500    Micro Results No results found for this or any previous visit (from the past 240 hour(s)).  Radiology Reports Dg Chest Port 1 View  Result Date: 11/04/2018 CLINICAL DATA:  Shortness of breath.  Follow-up pneumonia. EXAM: PORTABLE CHEST 1 VIEW COMPARISON:  11/03/2018 FINDINGS: Widespread bilateral pulmonary infiltrates persist. No change. No dense consolidation, collapse or effusions. Heart size remains upper limits of normal. IMPRESSION: No change.  Persistent widespread pulmonary infiltrates. Electronically Signed   By: Nelson Chimes M.D.   On: 11/04/2018 08:07   Dg Chest Port 1 View  Result Date: 11/03/2018 CLINICAL DATA:  Tachycardia.  Coronavirus  infection. EXAM: PORTABLE CHEST 1 VIEW COMPARISON:  10/26/2018 FINDINGS: Widespread patchy and hazy pulmonary infiltrates persist, but are improved compared to the last exam. No worsening or new findings. No effusions. IMPRESSION: Persistent widespread patchy and hazy pulmonary infiltrates consistent with viral pneumonia. Infiltrates are less dense than were seen on the prior exam. Electronically Signed   By: Nelson Chimes M.D.   On: 11/03/2018 11:43   Dg Chest Port 1 View  Result Date: 10/26/2018 CLINICAL DATA:  Acute respiratory disease.  Hypoxia. EXAM: PORTABLE CHEST 1 VIEW COMPARISON:  10/24/2018 FINDINGS: Diffuse bilateral interstitial and alveolar airspace opacities. No significant interval change compared with the prior exam. No pleural effusion or pneumothorax. Stable cardiomediastinal silhouette. No aggressive osseous lesion. IMPRESSION: Stable diffuse bilateral interstitial and alveolar airspace disease most consistent with multilobar pneumonia including atypical pneumonia. Electronically Signed   By: Kathreen Devoid   On: 10/26/2018 09:39   Dg Chest Port 1 View  Result Date: 10/24/2018 CLINICAL DATA:  Order for SOB  Hx of HTN EXAM: PORTABLE CHEST - 1 VIEW COMPARISON:  10/22/2018 FINDINGS: Progression of bilateral diffuse coarse airspace opacities with relative sparing of the apices. Heart size and mediastinal contours are within normal limits. Aortic Atherosclerosis (ICD10-170.0).  No effusion. Visualized bones unremarkable. IMPRESSION: Worsening bilateral infiltrates or edema. Electronically Signed   By: Corlis Leak  Hassell M.D.   On: 10/24/2018 08:55   Dg Chest Port 1 View  Result Date: 10/22/2018 CLINICAL DATA:  Pneumonia. EXAM: PORTABLE CHEST 1 VIEW COMPARISON:  Radiograph of October 10, 2018. FINDINGS: The heart size and mediastinal contours are within normal limits. No pneumothorax or pleural effusion is noted. Slightly improved bilateral lung opacities are noted consistent with multifocal pneumonia. The  visualized skeletal structures are unremarkable. IMPRESSION: Slightly improved multifocal pneumonia. Electronically Signed   By: Lupita RaiderJames  Green Jr M.D.   On: 10/22/2018 11:32   Dg Chest Port 1 View  Result Date: 10/10/2018 CLINICAL DATA:  Fever and shortness of breath EXAM: PORTABLE CHEST 1 VIEW COMPARISON:  None. FINDINGS: There are bilateral streaky airspace opacities, worst in the right upper lobe. Cardiomediastinal contours are normal. No pneumothorax or sizable pleural effusion. IMPRESSION: Bilateral streaky airspace opacities which may indicate multifocal infection. Electronically Signed   By: Deatra RobinsonKevin  Herman M.D.   On: 10/10/2018 17:30

## 2018-11-04 NOTE — Progress Notes (Deleted)
Pt received plasma w/o complications. Pt cooperative/calm/resting.  

## 2018-11-04 NOTE — Progress Notes (Signed)
Notified daughter of progress.  All questions were answered.

## 2018-11-05 LAB — COMPREHENSIVE METABOLIC PANEL
ALT: 41 U/L (ref 0–44)
AST: 17 U/L (ref 15–41)
Albumin: 2.7 g/dL — ABNORMAL LOW (ref 3.5–5.0)
Alkaline Phosphatase: 43 U/L (ref 38–126)
Anion gap: 9 (ref 5–15)
BUN: 34 mg/dL — ABNORMAL HIGH (ref 8–23)
CO2: 30 mmol/L (ref 22–32)
Calcium: 8.7 mg/dL — ABNORMAL LOW (ref 8.9–10.3)
Chloride: 99 mmol/L (ref 98–111)
Creatinine, Ser: 0.77 mg/dL (ref 0.44–1.00)
GFR calc Af Amer: >60 mL/min (ref >60)
GFR calc non Af Amer: >60 mL/min (ref >60)
Glucose, Bld: 77 mg/dL (ref 70–99)
Potassium: 3.5 mmol/L (ref 3.5–5.1)
Sodium: 138 mmol/L (ref 135–145)
Total Bilirubin: 0.5 mg/dL (ref 0.3–1.2)
Total Protein: 6.4 g/dL — ABNORMAL LOW (ref 6.5–8.1)

## 2018-11-05 LAB — FERRITIN: Ferritin: 293 ng/mL (ref 11–307)

## 2018-11-05 LAB — CBC WITH DIFFERENTIAL/PLATELET
Abs Immature Granulocytes: 0.05 10*3/uL (ref 0.00–0.07)
Basophils Absolute: 0 10*3/uL (ref 0.0–0.1)
Basophils Relative: 0 %
Eosinophils Absolute: 0.9 10*3/uL — ABNORMAL HIGH (ref 0.0–0.5)
Eosinophils Relative: 8 %
HCT: 39.6 % (ref 36.0–46.0)
Hemoglobin: 12.8 g/dL (ref 12.0–15.0)
Immature Granulocytes: 0 %
Lymphocytes Relative: 14 %
Lymphs Abs: 1.6 10*3/uL (ref 0.7–4.0)
MCH: 31.6 pg (ref 26.0–34.0)
MCHC: 32.3 g/dL (ref 30.0–36.0)
MCV: 97.8 fL (ref 80.0–100.0)
Monocytes Absolute: 1 10*3/uL (ref 0.1–1.0)
Monocytes Relative: 9 %
Neutro Abs: 7.7 10*3/uL (ref 1.7–7.7)
Neutrophils Relative %: 69 %
Platelets: 269 10*3/uL (ref 150–400)
RBC: 4.05 MIL/uL (ref 3.87–5.11)
RDW: 16.4 % — ABNORMAL HIGH (ref 11.5–15.5)
WBC: 11.3 10*3/uL — ABNORMAL HIGH (ref 4.0–10.5)
nRBC: 0 % (ref 0.0–0.2)

## 2018-11-05 LAB — BRAIN NATRIURETIC PEPTIDE: B Natriuretic Peptide: 66 pg/mL (ref 0.0–100.0)

## 2018-11-05 LAB — D-DIMER, QUANTITATIVE: D-Dimer, Quant: 0.83 ug/mL-FEU — ABNORMAL HIGH (ref 0.00–0.50)

## 2018-11-05 LAB — GLUCOSE, CAPILLARY
Glucose-Capillary: 82 mg/dL (ref 70–99)
Glucose-Capillary: 328 mg/dL — ABNORMAL HIGH (ref 70–99)
Glucose-Capillary: 249 mg/dL — ABNORMAL HIGH (ref 70–99)
Glucose-Capillary: 185 mg/dL — ABNORMAL HIGH (ref 70–99)

## 2018-11-05 LAB — LACTATE DEHYDROGENASE: LDH: 344 U/L — ABNORMAL HIGH (ref 98–192)

## 2018-11-05 LAB — C-REACTIVE PROTEIN: CRP: 2.1 mg/dL — ABNORMAL HIGH (ref <1.0)

## 2018-11-05 LAB — MAGNESIUM: Magnesium: 2.6 mg/dL — ABNORMAL HIGH (ref 1.7–2.4)

## 2018-11-05 NOTE — Progress Notes (Signed)
PROGRESS NOTE                                                                                                                                                                                                             Christina Clark Demographics:    Christina Clark, is a 68 y.o. female, DOB - 16-Aug-1950, ZOX:[id number]  Outpatient Primary MD for the Christina Clark is Christina Clark, No Pcp Per    LOS - 26  No chief complaint on file.      Brief Narrative: Christina Clark is a 68 y.o. female with PMHx of morbid obesity, HTN, depression-presenting with acute hypoxemic respiratory failure secondary to COVID-19 pneumonia.  She was treated with Remdesivir, Actemra and steroids-hospital course unfortunately has been prolonged and complicated by persistent hypoxemia.  See below for further details.    Subjective:    Christina Clark today appears comfortable-denies any chest pain.  No nausea or vomiting.   Assessment  & Plan :   Acute hypoxemic respiratory failure secondary to COVID-19 pneumonia: Remains on high flow O2-has completed maximal medical treatment with Remdesivir, Actemra, plasma-remains on IV Solu-Medrol. Inflammatory markers are mildly elevated but otherwise stable.  COVID-19 Labs:  Recent Labs    11/03/18 0446 11/04/18 0500 11/05/18 0500 11/05/18 1140  DDIMER 0.88* 0.66*  --  0.83*  FERRITIN 348* 329* 293  --   LDH 404* 358* 344*  --   CRP 7.5* 4.5* 2.1*  --     Lab Results  Component Value Date   SARSCOV2NAA POSITIVE (A) 10/10/2018     Acute on chronic diastolic heart failure:  No signs of volume overload-hold Lasix-continue to keep in negative balance.  HTN: Stable-not on any antihypertensives  DM-2 (A1c 8.1):  CBGs stable-continue Levemir 20 units nightly, SSI, hypoglycemic protocol  Depression with anxiety:  Stable-continue Zoloft and Xanax  Condition - Extremely Guarded  Family Communication  :  Daughter updated over the phone, assisted with translation at bedside   Code Status : DNI-no CPR; (bipap, meds, defib only)  Disposition Plan  :  Remain inpatient, continues to require high flow nasal cannula, nonrebreather to maintain sats above 90%.  Once Christina Clark able to ambulate without hypoxia or supplemental oxygen would consider discharge, PT OT ongoing, previously recommending inpatient rehab, will continue to follow along clinically with ultimate disposition pending  Consults  :  None  Procedures  : NONE  DVT Prophylaxis  :  Lovenox twice daily dosing  Lab Results  Component Value Date   PLT 269 11/05/2018    Inpatient Medications  Scheduled Meds: . sodium chloride   Intravenous Once  . clonazepam  0.5 mg Oral BID  . enoxaparin (LOVENOX) injection  0.5 mg/kg Subcutaneous Q12H  . insulin aspart  0-20 Units Subcutaneous TID WC  . insulin detemir  20 Units Subcutaneous QHS  . methylPREDNISolone (SOLU-MEDROL) injection  60 mg Intravenous Daily  . pantoprazole  40 mg Oral Daily  . Ensure Max Protein  11 oz Oral BID  . sertraline  50 mg Oral Daily  . sodium chloride flush  10-40 mL Intracatheter Q12H   Continuous Infusions: PRN Meds:.acetaminophen, albuterol, ALPRAZolam, alum & mag hydroxide-simeth, lip balm, menthol-cetylpyridinium, morphine injection, [DISCONTINUED] ondansetron **OR** ondansetron (ZOFRAN) IV, polyethylene glycol, sodium chloride  Antibiotics  :    Anti-infectives (From admission, onward)   Start     Dose/Rate Route Frequency Ordered Stop   10/12/18 0900  remdesivir 100 mg in sodium chloride 0.9 % 230 mL IVPB     100 mg 500 mL/hr over 30 Minutes Intravenous Every 24 hours 10/11/18 0745 10/15/18 0928   10/11/18 0900  remdesivir 200 mg in sodium chloride 0.9 % 210 mL IVPB     200 mg 500 mL/hr over 30 Minutes Intravenous Once 10/11/18 0745 10/11/18 1617       Objective:   Vitals:   11/05/18 0626 11/05/18 0736 11/05/18 0914 11/05/18 1146  BP: 118/61   (!) 105/59  Pulse: 81   (!) 101  Resp:      Temp: 97.6 F (36.4  C) 98.1 F (36.7 C)  98.2 F (36.8 C)  TempSrc: Axillary Oral  Axillary  SpO2: 93%  92% 92%  Weight:      Height:        Wt Readings from Last 3 Encounters:  11/03/18 97.6 kg  10/10/18 109 kg     Intake/Output Summary (Last 24 hours) at 11/05/2018 1407 Last data filed at 11/05/2018 0737 Gross per 24 hour  Intake -  Output 250 ml  Net -250 ml     Physical Exam General:  Pleasantly resting in bed, No acute distress. HEENT:  Normocephalic atraumatic.  Sclerae nonicteric, noninjected.  Extraocular movements intact bilaterally. Neck:  Without mass or deformity.  Trachea is midline. Lungs: Coarse breath sounds bilaterally without overt rhonchi wheeze or rales Heart:  Regular rate and rhythm.  Without murmurs, rubs, or gallops. Abdomen:  Soft, nontender, nondistended.  Without guarding or rebound. Extremities: Without cyanosis, clubbing, edema, or obvious deformity. Vascular:  Dorsalis pedis and posterior tibial pulses palpable bilaterally. Skin:  Warm and dry, no erythema, no ulcerations.   Data Review:    CBC Recent Labs  Lab 11/01/18 0300 11/02/18 0505 11/03/18 0446 11/04/18 0500 11/05/18 0500  WBC 14.6* 13.3* 13.0* 10.8* 11.3*  HGB 15.2* 13.4 13.0 12.9 12.8  HCT 46.6* 42.2 41.5 40.8 39.6  PLT 197 180 222 232 269  MCV 96.9 96.1 97.2 98.1 97.8  MCH 31.6 30.5 30.4 31.0 31.6  MCHC 32.6 31.8 31.3 31.6 32.3  RDW 17.2* 16.6* 16.7* 16.6* 16.4*  LYMPHSABS 1.0 1.0 1.2 1.3 1.6  MONOABS 0.9 1.0 1.1* 1.0 1.0  EOSABS 0.2 0.3 0.3 0.8* 0.9*  BASOSABS 0.0 0.0 0.0 0.0 0.0    Chemistries  Recent Labs  Lab 11/01/18 0300 11/02/18 0505 11/03/18 0446 11/04/18 0500 11/05/18 0500  NA 137 137 136 139 138  K 3.9 3.6 3.9 4.1 3.5  CL 93* 93* 94* 98 99  CO2 27 31 30 29 30   GLUCOSE 58* 80 97 62* 77  BUN  55* 51* 44* 32* 34*  CREATININE 0.96 0.92 0.77 0.71 0.77  CALCIUM 9.2 8.7* 8.8* 8.8* 8.7*  MG 2.9* 2.8* 2.9* 2.6* 2.6*  AST 16 15 14* 12* 17  ALT 41 43 39 37 41  ALKPHOS 58  54 55 48 43  BILITOT 0.8 0.7 0.7 0.8 0.5   ------------------------------------------------------------------------------------------------------------------ No results for input(s): CHOL, HDL, LDLCALC, TRIG, CHOLHDL, LDLDIRECT in the last 72 hours.  Lab Results  Component Value Date   HGBA1C 8.1 (H) 10/11/2018   ------------------------------------------------------------------------------------------------------------------ No results for input(s): TSH, T4TOTAL, T3FREE, THYROIDAB in the last 72 hours.  Invalid input(s): FREET3 ------------------------------------------------------------------------------------------------------------------ Recent Labs    11/04/18 0500 11/05/18 0500  FERRITIN 329* 293    Coagulation profile No results for input(s): INR, PROTIME in the last 168 hours.  Recent Labs    11/04/18 0500 11/05/18 1140  DDIMER 0.66* 0.83*    Cardiac Enzymes No results for input(s): CKMB, TROPONINI, MYOGLOBIN in the last 168 hours.  Invalid input(s): CK ------------------------------------------------------------------------------------------------------------------    Component Value Date/Time   BNP 66.0 11/05/2018 0500    Micro Results No results found for this or any previous visit (from the past 240 hour(s)).  Radiology Reports Dg Chest Port 1 View  Result Date: 11/04/2018 CLINICAL DATA:  Shortness of breath.  Follow-up pneumonia. EXAM: PORTABLE CHEST 1 VIEW COMPARISON:  11/03/2018 FINDINGS: Widespread bilateral pulmonary infiltrates persist. No change. No dense consolidation, collapse or effusions. Heart size remains upper limits of normal. IMPRESSION: No change.  Persistent widespread pulmonary infiltrates. Electronically Signed   By: Nelson Chimes M.D.   On: 11/04/2018 08:07   Dg Chest Port 1 View  Result Date: 11/03/2018 CLINICAL DATA:  Tachycardia.  Coronavirus infection. EXAM: PORTABLE CHEST 1 VIEW COMPARISON:  10/26/2018 FINDINGS: Widespread patchy  and hazy pulmonary infiltrates persist, but are improved compared to the last exam. No worsening or new findings. No effusions. IMPRESSION: Persistent widespread patchy and hazy pulmonary infiltrates consistent with viral pneumonia. Infiltrates are less dense than were seen on the prior exam. Electronically Signed   By: Nelson Chimes M.D.   On: 11/03/2018 11:43   Dg Chest Port 1 View  Result Date: 10/26/2018 CLINICAL DATA:  Acute respiratory disease.  Hypoxia. EXAM: PORTABLE CHEST 1 VIEW COMPARISON:  10/24/2018 FINDINGS: Diffuse bilateral interstitial and alveolar airspace opacities. No significant interval change compared with the prior exam. No pleural effusion or pneumothorax. Stable cardiomediastinal silhouette. No aggressive osseous lesion. IMPRESSION: Stable diffuse bilateral interstitial and alveolar airspace disease most consistent with multilobar pneumonia including atypical pneumonia. Electronically Signed   By: Kathreen Devoid   On: 10/26/2018 09:39   Dg Chest Port 1 View  Result Date: 10/24/2018 CLINICAL DATA:  Order for SOB  Hx of HTN EXAM: PORTABLE CHEST - 1 VIEW COMPARISON:  10/22/2018 FINDINGS: Progression of bilateral diffuse coarse airspace opacities with relative sparing of the apices. Heart size and mediastinal contours are within normal limits. Aortic Atherosclerosis (ICD10-170.0). No effusion. Visualized bones unremarkable. IMPRESSION: Worsening bilateral infiltrates or edema. Electronically Signed   By: Lucrezia Europe M.D.   On: 10/24/2018 08:55   Dg Chest Port 1 View  Result Date: 10/22/2018 CLINICAL DATA:  Pneumonia. EXAM: PORTABLE CHEST 1 VIEW COMPARISON:  Radiograph of October 10, 2018. FINDINGS: The heart size and mediastinal contours are within normal limits. No pneumothorax or pleural effusion is noted. Slightly improved bilateral lung opacities are noted consistent with multifocal pneumonia. The visualized skeletal structures are unremarkable. IMPRESSION: Slightly improved multifocal  pneumonia. Electronically Signed  By: Lupita RaiderJames  Green Jr M.D.   On: 10/22/2018 11:32   Dg Chest Port 1 View  Result Date: 10/10/2018 CLINICAL DATA:  Fever and shortness of breath EXAM: PORTABLE CHEST 1 VIEW COMPARISON:  None. FINDINGS: There are bilateral streaky airspace opacities, worst in the right upper lobe. Cardiomediastinal contours are normal. No pneumothorax or sizable pleural effusion. IMPRESSION: Bilateral streaky airspace opacities which may indicate multifocal infection. Electronically Signed   By: Deatra RobinsonKevin  Herman M.D.   On: 10/10/2018 17:30    Time Spent in minutes  25  Azucena FallenWilliam C Gordie Belvin M.D on 11/05/2018 at 2:07 PM  To page go to [email or web address] - use universal password [facility] -  Office  [phone]  Admit date - 10/10/2018

## 2018-11-05 NOTE — Progress Notes (Signed)
Inpatient Diabetes Program Recommendations  AACE/ADA: New Consensus Statement on Inpatient Glycemic Control (2015)  Target Ranges:  Prepandial:   less than 140 mg/dL      Peak postprandial:   less than 180 mg/dL (1-2 hours)      Critically ill patients:  140 - 180 mg/dL   Lab Results  Component Value Date   GLUCAP 328 (H) 11/05/2018   HGBA1C 8.1 (H) 10/11/2018    Review of Glycemic Control Results for Christina Clark, Christina Clark (MRN [id number]) as of 11/05/2018 14:37  Ref. Range 11/04/2018 11:03 11/04/2018 16:34 11/04/2018 22:07 11/05/2018 07:33 11/05/2018 11:40  Glucose-Capillary Latest Ref Range: 70 - 99 mg/dL 223 (H) 269 (H) 177 (H) 82 328 (H)    Inpatient Diabetes Program Recommendations:   Noted am CBG 82 and current Postprandial 328. While on steroids,Please consider: -Decrease Levemir to 15 units daily -Add Novolog 3 units tid meal coverage if eats 50% Secure chat with recommendations sent to Dr. Avon Gully.  Thank you, Nani Gasser. Moyses Pavey, RN, MSN, CDE  Diabetes Coordinator Inpatient Glycemic Control Team Team Pager [phone] (8am-5pm) 11/05/2018 2:39 PM

## 2018-11-05 NOTE — Progress Notes (Signed)
Inpatient Rehab Admissions Coordinator:   Pt back on NRB.  Continue to follow.   Shann Medal, PT, DPT Admissions Coordinator [phone] 11/05/18  9:56 AM

## 2018-11-05 NOTE — Progress Notes (Signed)
Physical Therapy Treatment Patient Details Name: Christina OuchMaria Reynoso Clark MRN: [id number] DOB: 10/02/1950 Today's Date: 11/05/2018    History of Present Illness 68 year old Spanish-speaking female with past medical history of morbid obesity, hypertension and depression  presenting to the emergency room 10/10/18 with 1 week of fever, cough and shortness of breath in the setting of known COVID-19 contacts.  Patient found to be COVID positive as well as hypoxic with bilateral infiltrates on chest x-ray.  Requiring oxygen support of high flow    PT Comments    The patient in room on a "partial ' NRB mask, satting in mid 80's.Monitor on forehead. Placed on full NRB mask with SaO2 dropping to 66 while ambulating x 5'  Patient recovered to 85% with rest breaks.  This is a difficult situation in therapies not having a guideline for supplemental oxygen to be used. Patient has had multiple means from Heated HFNC, HFNC, now partial NRB mask to NRB mask. Patient's SaO2 readings on forehead have a good pleth and noted to drop into 60's with exertion. This is a difficult situation for monitoring of  Oxygen in this patient. Patient not on Heart monitor during activity so RR  Rate was not available. Will place on  Heart monitor in order to monitor RR next visit.  Follow Up Recommendations  CIR     Equipment Recommendations  Rolling walker with 5" wheels    Recommendations for Other Services Rehab consult     Precautions / Restrictions Precautions Precautions: Fall Precaution Comments: on partial NRB mask with Sao2 in 80's Spoke to RN and placed on full NRB mask  If not on Hear monito , Do so in order to monitor RR    Mobility  Bed Mobility               General bed mobility comments: OOB in chair  Transfers   Equipment used: Rolling walker (2 wheeled) Transfers: Sit to/from Stand Sit to Stand: Min assist         General transfer comment: stood from recliner and BSC,with min/minguard  assist.  Ambulation/Gait Ambulation/Gait assistance: Min assist;+2 safety/equipment Gait Distance (Feet): 5 Feet(x2) Assistive device: Rolling walker (2 wheeled) Gait Pattern/deviations: Step-through pattern     General Gait Details: patient on NRM mask, SaO2 noted to drop to hi ^0's with good pleth and probe on forehead.  Patient gradually increased Sao2 with rest back to 85, then drops again when mobilizing.   Stairs             Wheelchair Mobility    Modified Rankin (Stroke Patients Only)       Balance                                            Cognition                                       General Comments: daughter on phone interpreting, patiet appears WFL, followed all directions, appears cheerful, music animates her      Exercises      General Comments        Pertinent Vitals/Pain Pain Assessment: No/denies pain    Home Living  Prior Function            PT Goals (current goals can now be found in the care plan section) Acute Rehab PT Goals Patient Stated Goal: to go home PT Goal Formulation: With patient/family Time For Goal Achievement: 11/19/18 Potential to Achieve Goals: Fair Progress towards PT goals: Not progressing toward goals - comment;Goals downgraded-see care plan    Frequency    Min 3X/week      PT Plan Current plan remains appropriate    Co-evaluation PT/OT/SLP Co-Evaluation/Treatment: Yes Reason for Co-Treatment: Complexity of the patient's impairments (multi-system involvement) PT goals addressed during session: Mobility/safety with mobility OT goals addressed during session: ADL's and self-care      AM-PAC PT "6 Clicks" Mobility   Outcome Measure  Help needed turning from your back to your side while in a flat bed without using bedrails?: A Lot Help needed moving from lying on your back to sitting on the side of a flat bed without using bedrails?: A  Lot Help needed moving to and from a bed to a chair (including a wheelchair)?: A Lot Help needed standing up from a chair using your arms (e.g., wheelchair or bedside chair)?: A Lot Help needed to walk in hospital room?: A Lot Help needed climbing 3-5 steps with a railing? : Total 6 Click Score: 11    End of Session Equipment Utilized During Treatment: Gait belt;Oxygen Activity Tolerance: Patient tolerated treatment well Patient left: in chair;with call bell/phone within reach Nurse Communication: Mobility status PT Visit Diagnosis: Difficulty in walking, not elsewhere classified (R26.2)     Time: 1050-1130 PT Time Calculation (min) (ACUTE ONLY): 40 min  Charges:  $Therapeutic Activity: 8-22 mins                     Tresa Endo PT Acute Rehabilitation Services [phone] Office [phone]    Christina Clark 11/05/2018, 2:48 PM

## 2018-11-05 NOTE — Progress Notes (Signed)
Spoke with pt's granddaughter Eyvonne Mechanic. She has been on the phone with pt during the morning while pt's daughter, Maryssa is at work. Denies questions at this time.

## 2018-11-05 NOTE — Progress Notes (Signed)
OT Treatment Note  Pt continues to make steady progress. Seen on 15L NRB. Able to take @ 6 steps to [facility] and then 6 steps back to chair after completing pericare with min [employer] music into session for exercise. Pt dancing with therapist in chair. Grand daughter interpreted during session. Desat to upper 60's during mobility with eventual rebound into upper 80s. Pt with improved ability to manage her anxiety during sessions. Will continue to progress to facilitate safe DC to next venue of care.    11/05/18 1500  OT Visit Information  Last OT Received On 11/05/18  Assistance Needed +2  PT/OT/SLP Co-Evaluation/Treatment Yes  Reason for Co-Treatment Complexity of the patient's impairments (multi-system involvement);To address functional/ADL transfers  OT goals addressed during session ADL's and self-care;Strengthening/ROM  History of Present Illness 68 year old Spanish-speaking female with past medical history of morbid obesity, hypertension and depression  presenting to the emergency room 10/10/18 with 1 week of fever, cough and shortness of breath in the setting of known COVID-19 contacts.  Patient found to be COVID positive as well as hypoxic with bilateral infiltrates on chest x-ray.  Requiring oxygen support of high flow  Precautions  Precautions Fall  Precaution Comments on partial NRB mask with Sao2 in 80's Spoke to RN and placed on full NRB mask  Pain Assessment  Pain Assessment No/denies pain  Cognition  Arousal/Alertness Awake/alert  Behavior During Therapy WFL for tasks assessed/performed  Overall Cognitive Status Within Functional Limits for tasks assessed  Upper Extremity Assessment  Upper Extremity Assessment Generalized weakness  Lower Extremity Assessment  Lower Extremity Assessment Defer to PT evaluation  ADL  Overall ADL's  Needs assistance/impaired  Grooming Set up;Sitting  Upper Body Bathing Set up;Sitting;Supervision/ safety  Lower Body Bathing Moderate  assistance;Sit to/from Retail buyer Minimal assistance;RW;Ambulation;BSC;+2 for safety/equipment  Toileting- Clothing Manipulation and Hygiene Moderate assistance;Sit to/from stand  Toileting - Clothing Manipulation Details (indicate cue type and reason) Pt able to wipe her bottom, but assistance due to fatigue  Functional mobility during ADLs Minimal assistance;+2 for safety/equipment;Rolling walker  Bed Mobility  General bed mobility comments OOB in chair  Balance  Sitting balance-Leahy Scale Good  Standing balance-Leahy Scale Poor  Standing balance comment reliant on support  Transfers  Overall transfer level Needs assistance  Equipment used Rolling walker (2 wheeled)  Transfers Sit to/from Stand  Sit to Stand Min assist;+2 safety/equipment  General Exercises - Upper Extremity  Shoulder Flexion Strengthening;Both;20 reps;Theraband (x 2 sets)  Shoulder ABduction Strengthening;20 reps;Seated;Theraband (x 2 sets)  Elbow Flexion Strengthening;Both;20 reps;Seated (x 2 sets)  Elbow Extension Strengthening;Both;20 reps;Seated;Theraband (x 2 sets)  General Exercises - Lower Extremity  Long Arc Quad Strengthening;Both;20 reps;Seated  Hip Flexion/Marching Strengthening;Both;20 reps;Seated  Ankle Circles/Pumps Strengthening;20 reps;Seated  Hip ABduction/ADduction Strengthening;Both;20 reps;Seated  Other Exercises  Other Exercises Using music to "chair dance" to incorporate activity to increase endurance  OT - End of Session  Equipment Utilized During Treatment Gait belt;Oxygen (15L NR)  Activity Tolerance Patient tolerated treatment well  Patient left in chair;with call bell/phone within reach  Nurse Communication Mobility status  OT Assessment/Plan  OT Plan Discharge plan remains appropriate  OT Visit Diagnosis Unsteadiness on feet (R26.81);Muscle weakness (generalized) (M62.81)  OT Frequency (ACUTE ONLY) Min 3X/week  Recommendations for Other Services Rehab consult   Follow Up Recommendations CIR;Supervision/Assistance - 24 hour  OT Equipment 3 in 1 bedside commode  AM-PAC OT "6 Clicks" Daily Activity Outcome Measure (Version 2)  Help from another person eating meals? 4  Help from another person taking care of personal grooming? 3  Help from another person toileting, which includes using toliet, bedpan, or urinal? 3  Help from another person bathing (including washing, rinsing, drying)? 2  Help from another person to put on and taking off regular upper body clothing? 3  Help from another person to put on and taking off regular lower body clothing? 2  6 Click Score 17  OT Goal Progression  Progress towards OT goals Progressing toward goals  Acute Rehab OT Goals  Patient Stated Goal to go home  OT Goal Formulation With patient/family  Time For Goal Achievement 11/19/18  Potential to Achieve Goals Good  ADL Goals  Pt Will Perform Upper Body Bathing with modified independence;sitting  Pt Will Perform Lower Body Bathing with modified independence;sit to/from stand;with adaptive equipment  Pt Will Perform Lower Body Dressing with modified independence;sit to/from stand;with adaptive equipment  Pt Will Transfer to Toilet with modified independence;ambulating;bedside commode  Pt Will Perform Toileting - Clothing Manipulation and hygiene with modified independence;sit to/from stand  Pt/caregiver will Perform Home Exercise Program Increased strength;With theraband;Both right and left upper extremity;With Supervision;With written HEP provided  Additional ADL Goal #1 Pt will independently verbalize 3 energy conservation strategies for ADL  OT Time Calculation  OT Start Time (ACUTE ONLY) 1050  OT Stop Time (ACUTE ONLY) 1155  OT Time Calculation (min) 65 min  OT General Charges  $OT Visit 1 Visit  OT Treatments  $Self Care/Home Management  8-22 mins  $Therapeutic Exercise 8-22 mins  Luisa DagoHilary Lulubelle Simcoe, OT/L   Acute OT Clinical Specialist Acute Rehabilitation  Services Pager [phone] Office [phone]

## 2018-11-06 LAB — CBC WITH DIFFERENTIAL/PLATELET
Abs Immature Granulocytes: 0.05 10*3/uL (ref 0.00–0.07)
Basophils Absolute: 0 10*3/uL (ref 0.0–0.1)
Basophils Relative: 0 %
Eosinophils Absolute: 0.9 10*3/uL — ABNORMAL HIGH (ref 0.0–0.5)
Eosinophils Relative: 9 %
HCT: 38.1 % (ref 36.0–46.0)
Hemoglobin: 12.1 g/dL (ref 12.0–15.0)
Immature Granulocytes: 1 %
Lymphocytes Relative: 15 %
Lymphs Abs: 1.5 10*3/uL (ref 0.7–4.0)
MCH: 31.1 pg (ref 26.0–34.0)
MCHC: 31.8 g/dL (ref 30.0–36.0)
MCV: 97.9 fL (ref 80.0–100.0)
Monocytes Absolute: 1 10*3/uL (ref 0.1–1.0)
Monocytes Relative: 9 %
Neutro Abs: 6.8 10*3/uL (ref 1.7–7.7)
Neutrophils Relative %: 66 %
Platelets: 296 10*3/uL (ref 150–400)
RBC: 3.89 MIL/uL (ref 3.87–5.11)
RDW: 16.3 % — ABNORMAL HIGH (ref 11.5–15.5)
WBC: 10.3 10*3/uL (ref 4.0–10.5)
nRBC: 0 % (ref 0.0–0.2)

## 2018-11-06 LAB — COMPREHENSIVE METABOLIC PANEL
ALT: 43 U/L (ref 0–44)
AST: 17 U/L (ref 15–41)
Albumin: 2.7 g/dL — ABNORMAL LOW (ref 3.5–5.0)
Alkaline Phosphatase: 40 U/L (ref 38–126)
Anion gap: 10 (ref 5–15)
BUN: 31 mg/dL — ABNORMAL HIGH (ref 8–23)
CO2: 28 mmol/L (ref 22–32)
Calcium: 8.7 mg/dL — ABNORMAL LOW (ref 8.9–10.3)
Chloride: 100 mmol/L (ref 98–111)
Creatinine, Ser: 0.76 mg/dL (ref 0.44–1.00)
GFR calc Af Amer: >60 mL/min (ref >60)
GFR calc non Af Amer: >60 mL/min (ref >60)
Glucose, Bld: 66 mg/dL — ABNORMAL LOW (ref 70–99)
Potassium: 3.3 mmol/L — ABNORMAL LOW (ref 3.5–5.1)
Sodium: 138 mmol/L (ref 135–145)
Total Bilirubin: 0.4 mg/dL (ref 0.3–1.2)
Total Protein: 6.1 g/dL — ABNORMAL LOW (ref 6.5–8.1)

## 2018-11-06 LAB — BRAIN NATRIURETIC PEPTIDE: B Natriuretic Peptide: 72.1 pg/mL (ref 0.0–100.0)

## 2018-11-06 LAB — GLUCOSE, CAPILLARY
Glucose-Capillary: 66 mg/dL — ABNORMAL LOW (ref 70–99)
Glucose-Capillary: 202 mg/dL — ABNORMAL HIGH (ref 70–99)
Glucose-Capillary: 272 mg/dL — ABNORMAL HIGH (ref 70–99)
Glucose-Capillary: 193 mg/dL — ABNORMAL HIGH (ref 70–99)

## 2018-11-06 LAB — FERRITIN: Ferritin: 288 ng/mL (ref 11–307)

## 2018-11-06 LAB — C-REACTIVE PROTEIN: CRP: 1.2 mg/dL — ABNORMAL HIGH (ref <1.0)

## 2018-11-06 LAB — LACTATE DEHYDROGENASE: LDH: 348 U/L — ABNORMAL HIGH (ref 98–192)

## 2018-11-06 LAB — MAGNESIUM: Magnesium: 2.4 mg/dL (ref 1.7–2.4)

## 2018-11-06 LAB — D-DIMER, QUANTITATIVE: D-Dimer, Quant: 0.58 ug/mL-FEU — ABNORMAL HIGH (ref 0.00–0.50)

## 2018-11-06 MED ORDER — METHYLPREDNISOLONE SODIUM SUCC 40 MG IJ SOLR
40.0000 mg | Freq: Every day | INTRAMUSCULAR | Status: DC
  Administered 2018-11-06 – 2018-11-10 (×5): 40 mg via INTRAVENOUS
  Filled 2018-11-06 (×5): qty 1

## 2018-11-06 NOTE — Progress Notes (Signed)
PROGRESS NOTE                                                                                                                                                                                                             Patient Demographics:    Joani Cosma, is a 68 y.o. female, DOB - [birth year], OVF:[id number]  Outpatient Primary MD for the patient is Patient, No Pcp Per    LOS - 27  No chief complaint on file.      Brief Narrative: Patient is a 68 y.o. female with PMHx of morbid obesity, HTN, depression-presenting with acute hypoxemic respiratory failure secondary to COVID-19 pneumonia.  She was treated with Remdesivir, Actemra and steroids-hospital course unfortunately has been prolonged and complicated by persistent hypoxemia.  See below for further details.    Subjective:   No acute issues or events overnight, patient remains hopeful for discharge, continue to explain that patient will need to be inpatient until her oxygen is able to be weaned off with ambulation.  She declines chest pain, nausea, vomiting, diarrhea, constipation, headache, fever, chills.  Does report minimal dyspnea with exertion but much improving from previous symptoms.   Assessment  & Plan :   Acute hypoxemic respiratory failure secondary to COVID-19 pneumonia: Remains on high flow O2-has completed maximal medical treatment with Remdesivir, Actemra, plasma-remains on IV Solu-Medrol. Inflammatory markers are mildly elevated but otherwise stable. Oxygen now being weaned to 11L HFNC (previously 14-15L)  COVID-19 Labs:  Recent Labs    11/04/18 0500 11/05/18 0500 11/05/18 1140 11/06/18 0602  DDIMER 0.66*  --  0.83* 0.58*  FERRITIN 329* 293  --  288  LDH 358* 344*  --  348*  CRP 4.5* 2.1*  --  1.2*    Lab Results  Component Value Date   SARSCOV2NAA POSITIVE (A) 10/10/2018     Acute on chronic diastolic heart failure:  No signs of volume overload-hold Lasix-continue to keep in negative balance.   HTN: Stable-not on any antihypertensives  DM-2 (A1c 8.1):  CBGs stable-continue Levemir 20 units nightly, SSI, hypoglycemic protocol  Depression with anxiety:  Stable-continue Zoloft and Xanax  Condition - Extremely Guarded  Family Communication  :  Daughter updated over the phone, assisted with translation at bedside  Code Status : DNI-no CPR; (bipap, meds, defib only)  Disposition Plan  :  Remain inpatient, continues to require high flow nasal cannula, nonrebreather to maintain sats above 90%.  Once patient able to ambulate without hypoxia or supplemental oxygen would consider discharge, PT OT ongoing, previously  recommending inpatient rehab, will continue to follow along clinically with ultimate disposition pending  Consults  :  None  Procedures  : NONE  DVT Prophylaxis  :  Lovenox twice daily dosing  Lab Results  Component Value Date   PLT 296 11/06/2018    Inpatient Medications  Scheduled Meds: . sodium chloride   Intravenous Once  . clonazepam  0.5 mg Oral BID  . enoxaparin (LOVENOX) injection  0.5 mg/kg Subcutaneous Q12H  . insulin aspart  0-20 Units Subcutaneous TID WC  . insulin detemir  20 Units Subcutaneous QHS  . methylPREDNISolone (SOLU-MEDROL) injection  40 mg Intravenous Daily  . pantoprazole  40 mg Oral Daily  . Ensure Max Protein  11 oz Oral BID  . sertraline  50 mg Oral Daily  . sodium chloride flush  10-40 mL Intracatheter Q12H   Continuous Infusions: PRN Meds:.acetaminophen, albuterol, ALPRAZolam, alum & mag hydroxide-simeth, lip balm, menthol-cetylpyridinium, morphine injection, [DISCONTINUED] ondansetron **OR** ondansetron (ZOFRAN) IV, polyethylene glycol, sodium chloride  Antibiotics  :    Anti-infectives (From admission, onward)   Start     Dose/Rate Route Frequency Ordered Stop   10/12/18 0900  remdesivir 100 mg in sodium chloride 0.9 % 230 mL IVPB     100 mg 500 mL/hr over 30 Minutes Intravenous Every 24 hours 10/11/18 0745 10/15/18 0928    10/11/18 0900  remdesivir 200 mg in sodium chloride 0.9 % 210 mL IVPB     200 mg 500 mL/hr over 30 Minutes Intravenous Once 10/11/18 0745 10/11/18 1617       Objective:   Vitals:   11/06/18 0725 11/06/18 1202 11/06/18 1532 11/06/18 1611  BP: (!) 103/55 (!) 101/56  104/60  Pulse: 85 95 90 76  Resp: (!) 21 (!) 26 (!) 25 (!) 23  Temp:  97.8 F (36.6 C)  98 F (36.7 C)  TempSrc:  Oral  Oral  SpO2: 90% 98% 96% 98%  Weight:      Height:        Wt Readings from Last 3 Encounters:  11/03/18 97.6 kg  10/10/18 109 kg     Intake/Output Summary (Last 24 hours) at 11/06/2018 1651 Last data filed at 11/06/2018 1612 Gross per 24 hour  Intake 490 ml  Output 100 ml  Net 390 ml     Physical Exam General:  Pleasantly resting in bed, No acute distress. HEENT:  Normocephalic atraumatic.  Sclerae nonicteric, noninjected.  Extraocular movements intact bilaterally. Neck:  Without mass or deformity.  Trachea is midline. Lungs: Coarse breath sounds bilaterally without overt rhonchi wheeze or rales Heart:  Regular rate and rhythm.  Without murmurs, rubs, or gallops. Abdomen:  Soft, nontender, nondistended.  Without guarding or rebound. Extremities: Without cyanosis, clubbing, edema, or obvious deformity. Vascular:  Dorsalis pedis and posterior tibial pulses palpable bilaterally. Skin:  Warm and dry, no erythema, no ulcerations.   Data Review:    CBC Recent Labs  Lab 11/02/18 0505 11/03/18 0446 11/04/18 0500 11/05/18 0500 11/06/18 0602  WBC 13.3* 13.0* 10.8* 11.3* 10.3  HGB 13.4 13.0 12.9 12.8 12.1  HCT 42.2 41.5 40.8 39.6 38.1  PLT 180 222 232 269 296  MCV 96.1 97.2 98.1 97.8 97.9  MCH 30.5 30.4 31.0 31.6 31.1  MCHC 31.8 31.3 31.6 32.3 31.8  RDW 16.6* 16.7* 16.6* 16.4* 16.3*  LYMPHSABS 1.0 1.2 1.3 1.6 1.5  MONOABS 1.0 1.1* 1.0 1.0 1.0  EOSABS 0.3 0.3 0.8* 0.9* 0.9*  BASOSABS 0.0 0.0 0.0 0.0 0.0  Chemistries  Recent Labs  Lab 11/02/18 0505 11/03/18 0446 11/04/18  0500 11/05/18 0500 11/06/18 0602  NA 137 136 139 138 138  K 3.6 3.9 4.1 3.5 3.3*  CL 93* 94* 98 99 100  CO2 31 30 29 30 28   GLUCOSE 80 97 62* 77 66*  BUN 51* 44* 32* 34* 31*  CREATININE 0.92 0.77 0.71 0.77 0.76  CALCIUM 8.7* 8.8* 8.8* 8.7* 8.7*  MG 2.8* 2.9* 2.6* 2.6* 2.4  AST 15 14* 12* 17 17  ALT 43 39 37 41 43  ALKPHOS 54 55 48 43 40  BILITOT 0.7 0.7 0.8 0.5 0.4   ------------------------------------------------------------------------------------------------------------------ No results for input(s): CHOL, HDL, LDLCALC, TRIG, CHOLHDL, LDLDIRECT in the last 72 hours.  Lab Results  Component Value Date   HGBA1C 8.1 (H) 10/11/2018   ------------------------------------------------------------------------------------------------------------------ No results for input(s): TSH, T4TOTAL, T3FREE, THYROIDAB in the last 72 hours.  Invalid input(s): FREET3 ------------------------------------------------------------------------------------------------------------------ Recent Labs    11/05/18 0500 11/06/18 0602  FERRITIN 293 288    Coagulation profile No results for input(s): INR, PROTIME in the last 168 hours.  Recent Labs    11/05/18 1140 11/06/18 0602  DDIMER 0.83* 0.58*    Cardiac Enzymes No results for input(s): CKMB, TROPONINI, MYOGLOBIN in the last 168 hours.  Invalid input(s): CK ------------------------------------------------------------------------------------------------------------------    Component Value Date/Time   BNP 72.1 11/06/2018 0602    Micro Results No results found for this or any previous visit (from the past 240 hour(s)).  Radiology Reports Dg Chest Port 1 View  Result Date: 11/04/2018 CLINICAL DATA:  Shortness of breath.  Follow-up pneumonia. EXAM: PORTABLE CHEST 1 VIEW COMPARISON:  11/03/2018 FINDINGS: Widespread bilateral pulmonary infiltrates persist. No change. No dense consolidation, collapse or effusions. Heart size remains upper  limits of normal. IMPRESSION: No change.  Persistent widespread pulmonary infiltrates. Electronically Signed   By: Paulina FusiMark  Shogry M.D.   On: 11/04/2018 08:07   Dg Chest Port 1 View  Result Date: 11/03/2018 CLINICAL DATA:  Tachycardia.  Coronavirus infection. EXAM: PORTABLE CHEST 1 VIEW COMPARISON:  10/26/2018 FINDINGS: Widespread patchy and hazy pulmonary infiltrates persist, but are improved compared to the last exam. No worsening or new findings. No effusions. IMPRESSION: Persistent widespread patchy and hazy pulmonary infiltrates consistent with viral pneumonia. Infiltrates are less dense than were seen on the prior exam. Electronically Signed   By: Paulina FusiMark  Shogry M.D.   On: 11/03/2018 11:43   Dg Chest Port 1 View  Result Date: 10/26/2018 CLINICAL DATA:  Acute respiratory disease.  Hypoxia. EXAM: PORTABLE CHEST 1 VIEW COMPARISON:  10/24/2018 FINDINGS: Diffuse bilateral interstitial and alveolar airspace opacities. No significant interval change compared with the prior exam. No pleural effusion or pneumothorax. Stable cardiomediastinal silhouette. No aggressive osseous lesion. IMPRESSION: Stable diffuse bilateral interstitial and alveolar airspace disease most consistent with multilobar pneumonia including atypical pneumonia. Electronically Signed   By: Elige KoHetal  Patel   On: 10/26/2018 09:39   Dg Chest Port 1 View  Result Date: 10/24/2018 CLINICAL DATA:  Order for SOB  Hx of HTN EXAM: PORTABLE CHEST - 1 VIEW COMPARISON:  10/22/2018 FINDINGS: Progression of bilateral diffuse coarse airspace opacities with relative sparing of the apices. Heart size and mediastinal contours are within normal limits. Aortic Atherosclerosis (ICD10-170.0). No effusion. Visualized bones unremarkable. IMPRESSION: Worsening bilateral infiltrates or edema. Electronically Signed   By: Corlis Leak  Hassell M.D.   On: 10/24/2018 08:55   Dg Chest Port 1 View  Result Date: 10/22/2018 CLINICAL DATA:  Pneumonia. EXAM: PORTABLE CHEST  1 VIEW  COMPARISON:  Radiograph of October 10, 2018. FINDINGS: The heart size and mediastinal contours are within normal limits. No pneumothorax or pleural effusion is noted. Slightly improved bilateral lung opacities are noted consistent with multifocal pneumonia. The visualized skeletal structures are unremarkable. IMPRESSION: Slightly improved multifocal pneumonia. Electronically Signed   By: Lupita RaiderJames  Green Jr M.D.   On: 10/22/2018 11:32   Dg Chest Port 1 View  Result Date: 10/10/2018 CLINICAL DATA:  Fever and shortness of breath EXAM: PORTABLE CHEST 1 VIEW COMPARISON:  None. FINDINGS: There are bilateral streaky airspace opacities, worst in the right upper lobe. Cardiomediastinal contours are normal. No pneumothorax or sizable pleural effusion. IMPRESSION: Bilateral streaky airspace opacities which may indicate multifocal infection. Electronically Signed   By: Deatra RobinsonKevin  Herman M.D.   On: 10/10/2018 17:30   Time Spent in minutes  25  Azucena FallenWilliam C Jarryn Altland M.D on 11/05/2018 at 2:07 PM  To page go to [email or web address] - use universal password [facility] -  Office  [phone]  Admit date - 10/10/2018

## 2018-11-06 NOTE — Progress Notes (Signed)
Patient taken off NRB mask and placed on [place] nasal cannula 12 L, sats 96%. Patient tolerating well at this time.

## 2018-11-06 NOTE — Progress Notes (Signed)
Inpatient Diabetes Program Recommendations  AACE/ADA: New Consensus Statement on Inpatient Glycemic Control (2015)  Target Ranges:  Prepandial:   less than 140 mg/dL      Peak postprandial:   less than 180 mg/dL (1-2 hours)      Critically ill patients:  140 - 180 mg/dL   Lab Results  Component Value Date   GLUCAP 66 (L) 11/06/2018   HGBA1C 8.1 (H) 10/11/2018    Review of Glycemic Control Results for Christina Clark, Christina Clark (MRN [id number]) as of 11/06/2018 10:47  Ref. Range 11/05/2018 07:33 11/05/2018 11:40 11/05/2018 17:13 11/05/2018 20:45 11/06/2018 07:24  Glucose-Capillary Latest Ref Range: 70 - 99 mg/dL 82 328 (H) 249 (H) 185 (H) 66 (L)    Inpatient Diabetes Program Recommendations:   Noted am CBG 66 and current Postprandial CBGs elevated. Please consider: -Decrease Levemir to 15 units daily -Add Novolog 3 units tid meal coverage if eats 50% Secure chat with recommendations sent to Dr. Avon Gully.  Thank you, Nani Gasser. Kalei Mckillop, RN, MSN, CDE  Diabetes Coordinator Inpatient Glycemic Control Team Team Pager [phone] (8am-5pm) 11/06/2018 10:47 AM

## 2018-11-07 LAB — COMPREHENSIVE METABOLIC PANEL
ALT: 45 U/L — ABNORMAL HIGH (ref 0–44)
AST: 19 U/L (ref 15–41)
Albumin: 2.7 g/dL — ABNORMAL LOW (ref 3.5–5.0)
Alkaline Phosphatase: 40 U/L (ref 38–126)
Anion gap: 13 (ref 5–15)
BUN: 31 mg/dL — ABNORMAL HIGH (ref 8–23)
CO2: 25 mmol/L (ref 22–32)
Calcium: 8.6 mg/dL — ABNORMAL LOW (ref 8.9–10.3)
Chloride: 100 mmol/L (ref 98–111)
Creatinine, Ser: 0.79 mg/dL (ref 0.44–1.00)
GFR calc Af Amer: >60 mL/min (ref >60)
GFR calc non Af Amer: >60 mL/min (ref >60)
Glucose, Bld: 177 mg/dL — ABNORMAL HIGH (ref 70–99)
Potassium: 3.1 mmol/L — ABNORMAL LOW (ref 3.5–5.1)
Sodium: 138 mmol/L (ref 135–145)
Total Bilirubin: 0.4 mg/dL (ref 0.3–1.2)
Total Protein: 5.9 g/dL — ABNORMAL LOW (ref 6.5–8.1)

## 2018-11-07 LAB — CBC WITH DIFFERENTIAL/PLATELET
Abs Immature Granulocytes: 0.06 10*3/uL (ref 0.00–0.07)
Basophils Absolute: 0 10*3/uL (ref 0.0–0.1)
Basophils Relative: 0 %
Eosinophils Absolute: 0.8 10*3/uL — ABNORMAL HIGH (ref 0.0–0.5)
Eosinophils Relative: 9 %
HCT: 38 % (ref 36.0–46.0)
Hemoglobin: 12.1 g/dL (ref 12.0–15.0)
Immature Granulocytes: 1 %
Lymphocytes Relative: 12 %
Lymphs Abs: 1.2 10*3/uL (ref 0.7–4.0)
MCH: 31.5 pg (ref 26.0–34.0)
MCHC: 31.8 g/dL (ref 30.0–36.0)
MCV: 99 fL (ref 80.0–100.0)
Monocytes Absolute: 0.7 10*3/uL (ref 0.1–1.0)
Monocytes Relative: 8 %
Neutro Abs: 6.5 10*3/uL (ref 1.7–7.7)
Neutrophils Relative %: 70 %
Platelets: 306 10*3/uL (ref 150–400)
RBC: 3.84 MIL/uL — ABNORMAL LOW (ref 3.87–5.11)
RDW: 16.2 % — ABNORMAL HIGH (ref 11.5–15.5)
WBC: 9.3 10*3/uL (ref 4.0–10.5)
nRBC: 0 % (ref 0.0–0.2)

## 2018-11-07 LAB — GLUCOSE, CAPILLARY
Glucose-Capillary: 67 mg/dL — ABNORMAL LOW (ref 70–99)
Glucose-Capillary: 237 mg/dL — ABNORMAL HIGH (ref 70–99)
Glucose-Capillary: 310 mg/dL — ABNORMAL HIGH (ref 70–99)
Glucose-Capillary: 126 mg/dL — ABNORMAL HIGH (ref 70–99)

## 2018-11-07 LAB — MAGNESIUM: Magnesium: 2.3 mg/dL (ref 1.7–2.4)

## 2018-11-07 LAB — LACTATE DEHYDROGENASE: LDH: 332 U/L — ABNORMAL HIGH (ref 98–192)

## 2018-11-07 LAB — FERRITIN: Ferritin: 261 ng/mL (ref 11–307)

## 2018-11-07 LAB — C-REACTIVE PROTEIN: CRP: 0.9 mg/dL (ref <1.0)

## 2018-11-07 LAB — D-DIMER, QUANTITATIVE: D-Dimer, Quant: 0.57 ug/mL-FEU — ABNORMAL HIGH (ref 0.00–0.50)

## 2018-11-07 MED ORDER — INSULIN DETEMIR 100 UNIT/ML ~~LOC~~ SOLN
10.0000 [IU] | Freq: Every day | SUBCUTANEOUS | Status: DC
  Administered 2018-11-07 – 2018-11-14 (×8): 10 [IU] via SUBCUTANEOUS
  Filled 2018-11-07 (×8): qty 0.1

## 2018-11-07 MED ORDER — INSULIN ASPART 100 UNIT/ML ~~LOC~~ SOLN
3.0000 [IU] | Freq: Three times a day (TID) | SUBCUTANEOUS | Status: DC
  Administered 2018-11-07 – 2018-11-09 (×7): 3 [IU] via SUBCUTANEOUS

## 2018-11-07 NOTE — Progress Notes (Signed)
Physical Therapy Treatment Patient Details Name: Christina Clark MRN: [id number] DOB: 11-Jan-1951 Today's Date: 11/07/2018    History of Present Illness 68 year old Spanish-speaking female with past medical history of morbid obesity, hypertension and depression  presenting to the emergency room 10/10/18 with 1 week of fever, cough and shortness of breath in the setting of known COVID-19 contacts.  Patient found to be COVID positive as well as hypoxic with bilateral infiltrates on chest x-ray.  Requiring oxygen support of high flow    PT Comments    The patient appears less anxious with mobility. Did ambulate x 6' on 15 L HFNC with SaO2 dropping into high 60's then gradual increase back into mid 80's much faster recovery this visit. RR in 30's. Patient is improving very slowly.  Limited by DOE and desaturation.continue ambulation efforts  Follow Up Recommendations  CIR     Equipment Recommendations  Rolling walker with 5" wheels    Recommendations for Other Services       Precautions / Restrictions Precautions Precautions: Fall Precaution Comments: on HFNC at 15 l. can place on NRB mask if needed    Mobility  Bed Mobility     Rolling: Min assist         General bed mobility comments: extra time to mobilize, rest breaks  Transfers Overall transfer level: Needs assistance Equipment used: Rolling walker (2 wheeled) Transfers: Sit to/from Stand Sit to Stand: Min assist;+2 safety/equipment         General transfer comment: stood from bed with power assist to stand, extra time .  Ambulation/Gait Ambulation/Gait assistance: Min assist;+2 safety/equipment Gait Distance (Feet): 6 Feet Assistive device: Rolling walker (2 wheeled) Gait Pattern/deviations: Step-to pattern     General Gait Details: patient on 15 L HFNC, slow steps, encouraged slow breathing, Sao2 dropping into low 70's but  recovered to 80's much quicker than in previous sessions.   Stairs              Wheelchair Mobility    Modified Rankin (Stroke Patients Only)       Balance Overall balance assessment: Mild deficits observed, not formally tested   Sitting balance-Leahy Scale: Good     Standing balance support: Bilateral upper extremity supported;During functional activity Standing balance-Leahy Scale: Fair                              Cognition Arousal/Alertness: Awake/alert Behavior During Therapy: WFL for tasks assessed/performed                                   General Comments: granddaughteron phone, patient much less anxious about mobilizing and not as in need for time to recover      Exercises      General Comments        Pertinent Vitals/Pain Pain Assessment: No/denies pain    Home Living                      Prior Function            PT Goals (current goals can now be found in the care plan section) Progress towards PT goals: Progressing toward goals    Frequency    Min 3X/week      PT Plan Current plan remains appropriate    Co-evaluation PT/OT/SLP Co-Evaluation/Treatment: Yes Reason for Co-Treatment: Complexity of the patient's  impairments (multi-system involvement);For patient/therapist safety PT goals addressed during session: Mobility/safety with mobility OT goals addressed during session: ADL's and self-care      AM-PAC PT "6 Clicks" Mobility   Outcome Measure    Help needed moving from lying on your back to sitting on the side of a flat bed without using bedrails?: A Little Help needed moving to and from a bed to a chair (including a wheelchair)?: A Lot Help needed standing up from a chair using your arms (e.g., wheelchair or bedside chair)?: A Lot Help needed to walk in hospital room?: A Lot Help needed climbing 3-5 steps with a railing? : Total 6 Click Score: 10    End of Session Equipment Utilized During Treatment: Gait belt;Oxygen Activity Tolerance: Patient tolerated  treatment well Patient left: in chair;with call bell/phone within reach Nurse Communication: Mobility status PT Visit Diagnosis: Difficulty in walking, not elsewhere classified (R26.2)     Time: 7579-7282 PT Time Calculation (min) (ACUTE ONLY): 25 min  Charges:  $Gait Training: 8-22 mins                     [place] [phone]  Office [phone] }   [name] 11/07/2018, 1:28 PM

## 2018-11-07 NOTE — Progress Notes (Signed)
Patient is alert and oriented x  4. Dyspnea on exertion noted. Out of bed and sitting in recliner. O2 SAT at rest 95% on 2 liters nasal canula. Vital signs within normal limits (see flow sheet). Call bell within reach. Encouraged to report signs and symptoms to staff.

## 2018-11-07 NOTE — Progress Notes (Signed)
PROGRESS NOTE  Iran OuchMaria Reynoso Tirado  UEA:[id number] DOB: Jun 30, 1950 DOA: 10/10/2018 PCP: Patient, No Pcp Per   Brief Narrative: Iran OuchMaria Reynoso Tirado is a 68 y.o. female with PMHx of morbid obesity, HTN, depression-presenting with acute hypoxemic respiratory failure secondary to COVID-19 pneumonia.  She was treated with Remdesivir, Actemra and steroids-hospital course unfortunately has been prolonged and complicated by persistent hypoxemia.   Assessment & Plan: Principal Problem:   Acute respiratory failure with hypoxia (HCC) Active Problems:   Hypertension   Depression   Acute respiratory disease due to COVID-19 virus   Morbid obesity with BMI of 40.0-44.9, adult (HCC)   Diabetes mellitus type 2, uncontrolled (HCC)  Acute respiratory failure with hypoxia due to COVID-19 pneumonia:  - Completed remdesivir (6/11 - 6/15), give tocilizumab, continues on steroids.  - Wean oxygen as tolerates, still on 11L by [place].  - Avoid NSAIDs - Continue airborne/droplet precautions.  - Prone as able. IS/FV.  Acute on chronic HFpEF: - Monitor I/O maintain even/negative balance.    Hypertension: Blood pressure stable.   - Continue home ARB and HCTZ.  Depression: Having a significant amount of anxiety while hospitalized.  - Continue scheduled low dose xanax started while inpatient. - Continue home zoloft  Morbid obesity: BMI 41.   T2DM, uncontrolled with hyperglycemia: HbA1c of 8.5%. Not on medications at home.  - Due to AM hypoglycemia, decrease levemir and give mealtime novolog in addition to SSI.   Leukocytosis: Suspect stress demargination and steroid effect.  - Monitor for secondary infections.  DVT prophylaxis: Lovenox BID Code Status: Partial, DNI Family Communication: Granddaughter on phone throughout encounter Disposition Plan: CIR planned once hypoxia improves.  Consultants:   PCCM  Procedures:   None  Antimicrobials:  Remdesivir   Subjective: Stil into 60%'s on  ambulation, but feels well at rest. No chest pain or palpitations, amenable to getting up to chair as much as possible. Granddaughter on phone acts as Nurse, learning disabilitytranslator.  Objective: Vitals:   11/07/18 0416 11/07/18 0627 11/07/18 0640 11/07/18 0756  BP:    129/61  Pulse: 64 60 61 82  Resp: 19 (!) 22 19 (!) 24  Temp:    98.1 F (36.7 C)  TempSrc:    Oral  SpO2: 96% 92% 92% 91%  Weight:      Height:        Intake/Output Summary (Last 24 hours) at 11/07/2018 1633 Last data filed at 11/06/2018 2131 Gross per 24 hour  Intake 10 ml  Output 150 ml  Net -140 ml   Filed Weights   10/10/18 2300 11/03/18 0255  Weight: 108.7 kg 97.6 kg    Gen: 68 y.o. female in no distress Pulm: Non-labored breathing rapidly with supplemental oxygen. Clear to auscultation bilaterally.  CV: Regular rate and rhythm. No murmur, rub, or gallop. No JVD, no pedal edema. GI: Abdomen soft, non-tender, non-distended, with normoactive bowel sounds. No organomegaly or masses felt. Ext: Warm, no deformities Skin: No rashes, lesions ulcers Neuro: Alert and oriented. No focal neurological deficits. Psych: Judgement and insight appear normal. Mood & affect appropriate.   Data Reviewed: I have personally reviewed following labs and imaging studies  CBC: Recent Labs  Lab 11/03/18 0446 11/04/18 0500 11/05/18 0500 11/06/18 0602 11/07/18 0950  WBC 13.0* 10.8* 11.3* 10.3 9.3  NEUTROABS 10.3* 7.7 7.7 6.8 6.5  HGB 13.0 12.9 12.8 12.1 12.1  HCT 41.5 40.8 39.6 38.1 38.0  MCV 97.2 98.1 97.8 97.9 99.0  PLT 222 232 269 296 306   Basic  Metabolic Panel: Recent Labs  Lab 11/03/18 0446 11/04/18 0500 11/05/18 0500 11/06/18 0602 11/07/18 0950  NA 136 139 138 138 138  K 3.9 4.1 3.5 3.3* 3.1*  CL 94* 98 99 100 100  CO2 30 29 30 28 25   GLUCOSE 97 62* 77 66* 177*  BUN 44* 32* 34* 31* 31*  CREATININE 0.77 0.71 0.77 0.76 0.79  CALCIUM 8.8* 8.8* 8.7* 8.7* 8.6*  MG 2.9* 2.6* 2.6* 2.4 2.3   GFR: Estimated Creatinine Clearance:  78.5 mL/min (by C-G formula based on SCr of 0.79 mg/dL). Liver Function Tests: Recent Labs  Lab 11/03/18 0446 11/04/18 0500 11/05/18 0500 11/06/18 0602 11/07/18 0950  AST 14* 12* 17 17 19   ALT 39 37 41 43 45*  ALKPHOS 55 48 43 40 40  BILITOT 0.7 0.8 0.5 0.4 0.4  PROT 7.0 6.7 6.4* 6.1* 5.9*  ALBUMIN 2.9* 2.8* 2.7* 2.7* 2.7*   No results for input(s): LIPASE, AMYLASE in the last 168 hours. No results for input(s): AMMONIA in the last 168 hours. Coagulation Profile: No results for input(s): INR, PROTIME in the last 168 hours. Cardiac Enzymes: No results for input(s): CKTOTAL, CKMB, CKMBINDEX, TROPONINI in the last 168 hours. BNP (last 3 results) No results for input(s): PROBNP in the last 8760 hours. HbA1C: No results for input(s): HGBA1C in the last 72 hours. CBG: Recent Labs  Lab 11/06/18 1201 11/06/18 1542 11/06/18 2007 11/07/18 0757 11/07/18 1242  GLUCAP 202* 272* 193* 67* 237*   Lipid Profile: No results for input(s): CHOL, HDL, LDLCALC, TRIG, CHOLHDL, LDLDIRECT in the last 72 hours. Thyroid Function Tests: No results for input(s): TSH, T4TOTAL, FREET4, T3FREE, THYROIDAB in the last 72 hours. Anemia Panel: Recent Labs    11/06/18 0602 11/07/18 0955  FERRITIN 288 261   Urine analysis:    Component Value Date/Time   COLORURINE YELLOW 10/24/2018 0721   APPEARANCEUR CLEAR 10/24/2018 0721   LABSPEC 1.025 10/24/2018 0721   PHURINE 5.0 10/24/2018 0721   GLUCOSEU NEGATIVE 10/24/2018 0721   HGBUR NEGATIVE 10/24/2018 0721   BILIRUBINUR NEGATIVE 10/24/2018 0721   KETONESUR NEGATIVE 10/24/2018 0721   PROTEINUR NEGATIVE 10/24/2018 0721   NITRITE NEGATIVE 10/24/2018 0721   LEUKOCYTESUR NEGATIVE 10/24/2018 0721   No results found for this or any previous visit (from the past 240 hour(s)).    Radiology Studies: No results found.  Scheduled Meds: . sodium chloride   Intravenous Once  . clonazepam  0.5 mg Oral BID  . enoxaparin (LOVENOX) injection  0.5 mg/kg  Subcutaneous Q12H  . insulin aspart  0-20 Units Subcutaneous TID WC  . insulin aspart  3 Units Subcutaneous TID WC  . insulin detemir  10 Units Subcutaneous QHS  . methylPREDNISolone (SOLU-MEDROL) injection  40 mg Intravenous Daily  . pantoprazole  40 mg Oral Daily  . Ensure Max Protein  11 oz Oral BID  . sertraline  50 mg Oral Daily  . sodium chloride flush  10-40 mL Intracatheter Q12H   Continuous Infusions:   LOS: 28 days   Time spent: 25 minutes.  [name], MD [facility] [email or web address] Password [facility] 11/07/2018, 4:33 PM

## 2018-11-07 NOTE — Progress Notes (Signed)
Occupational Therapy Treatment Patient Details Name: Christina Clark MRN: [id number] DOB: Apr 22, 1951 Today's Date: 11/07/2018    History of present illness 68 year old Spanish-speaking female with past medical history of morbid obesity, hypertension and depression  presenting to the emergency room 10/10/18 with 1 week of fever, cough and shortness of breath in the setting of known COVID-19 contacts.  Patient found to be COVID positive as well as hypoxic with bilateral infiltrates on chest x-ray.  Requiring oxygen support of high flow   OT comments  Pt progressing towards established OT goals and presenting with motivated to participate in therapy despite fatigue. Pt performing short distance functional mobility in room with Min A +2 and RW. Spo2 dropping to 70s (at one point 68%) on 15L O2 via HFNC. With seated rest break and cues for purse lip breathing, pt able to recover to 88% on 15L; recover time shorter than prior sessions demonstrating increased activity tolerance. Continue to recommend dc to CIR and will continue to follow acutely as admitted.    Follow Up Recommendations  CIR;Supervision/Assistance - 24 hour    Equipment Recommendations  3 in 1 bedside commode    Recommendations for Other Services Rehab consult    Precautions / Restrictions Precautions Precautions: Fall Precaution Comments: on HFNC at 15 L. Restrictions Weight Bearing Restrictions: No       Mobility Bed Mobility Overal bed mobility: Needs Assistance   Rolling: Min assist         General bed mobility comments: extra time to mobilize, rest breaks  Transfers Overall transfer level: Needs assistance Equipment used: Rolling walker (2 wheeled) Transfers: Sit to/from Stand Sit to Stand: Min assist;+2 safety/equipment         General transfer comment: stood from bed with power assist to stand, extra time .    Balance Overall balance assessment: Mild deficits observed, not formally  tested Sitting-balance support: No upper extremity supported;Feet supported Sitting balance-Leahy Scale: Good     Standing balance support: Bilateral upper extremity supported;During functional activity Standing balance-Leahy Scale: Fair Standing balance comment: reliant on support                           ADL either performed or assessed with clinical judgement   ADL Overall ADL's : Needs assistance/impaired                         Toilet Transfer: Minimal assistance;Ambulation;RW(Simulated to recliner) Armed forces technical officer Details (indicate cue type and reason): Min A for power up and then safety during functional mobility forward         Functional mobility during ADLs: Minimal assistance;Rolling walker;+2 for safety/equipment;+2 for physical assistance       Vision       Perception     Praxis      Cognition Arousal/Alertness: Awake/alert Behavior During Therapy: WFL for tasks assessed/performed Overall Cognitive Status: Within Functional Limits for tasks assessed                                 General Comments: Motivated to participate in therapy. Granddaughter on phone, patient much less anxious about mobilizing and not as in need for time to recover        Exercises Other Exercises Other Exercises: Using music to "chair dance" to incorporate activity to increase endurance   Shoulder Instructions  General Comments SpO2 dropping to 70s and once to 68% on 15L O2. Requiring signiciant time with rest break to recover back to 80s and then 90% on 15L. HR stable and RR 30s.     Pertinent Vitals/ Pain       Pain Assessment: No/denies pain  Home Living                                          Prior Functioning/Environment              Frequency  Min 3X/week        Progress Toward Goals  OT Goals(current goals can now be found in the care plan section)  Progress towards OT goals: Progressing  toward goals  Acute Rehab OT Goals Patient Stated Goal: to go home OT Goal Formulation: With patient/family Time For Goal Achievement: 11/19/18 Potential to Achieve Goals: Good ADL Goals Pt Will Perform Upper Body Bathing: with modified independence;sitting Pt Will Perform Lower Body Bathing: with modified independence;sit to/from stand;with adaptive equipment Pt Will Perform Lower Body Dressing: with modified independence;sit to/from stand;with adaptive equipment Pt Will Transfer to Toilet: with modified independence;ambulating;bedside commode Pt Will Perform Toileting - Clothing Manipulation and hygiene: with modified independence;sit to/from stand Pt/caregiver will Perform Home Exercise Program: Increased strength;With theraband;Both right and left upper extremity;With Supervision;With written HEP provided Additional ADL Goal #1: Pt will independently verbalize 3 energy conservation strategies for ADL  Plan Discharge plan remains appropriate    Co-evaluation    PT/OT/SLP Co-Evaluation/Treatment: Yes Reason for Co-Treatment: Complexity of the patient's impairments (multi-system involvement);For patient/therapist safety;To address functional/ADL transfers PT goals addressed during session: Mobility/safety with mobility OT goals addressed during session: ADL's and self-care      AM-PAC OT "6 Clicks" Daily Activity     Outcome Measure   Help from another person eating meals?: None Help from another person taking care of personal grooming?: A Little Help from another person toileting, which includes using toliet, bedpan, or urinal?: A Little Help from another person bathing (including washing, rinsing, drying)?: A Lot Help from another person to put on and taking off regular upper body clothing?: A Little Help from another person to put on and taking off regular lower body clothing?: A Lot 6 Click Score: 17    End of Session Equipment Utilized During Treatment: Gait  belt;Oxygen(15L HFNC)  OT Visit Diagnosis: Unsteadiness on feet (R26.81);Muscle weakness (generalized) (M62.81)   Activity Tolerance Patient tolerated treatment well   Patient Left in chair;with call bell/phone within reach   Nurse Communication Mobility status        Time: [id number] OT Time Calculation (min): 28 min  Charges: OT General Charges $OT Visit: 1 Visit OT Treatments $Self Care/Home Management : 8-22 mins  Manali Mcelmurry MSOT, OTR/L Acute Rehab Pager: [phone] Office: [phone]   Theodoro GristCharis M Victorious Cosio 11/07/2018, 1:41 PM

## 2018-11-08 LAB — COMPREHENSIVE METABOLIC PANEL
ALT: 46 U/L — ABNORMAL HIGH (ref 0–44)
AST: 18 U/L (ref 15–41)
Albumin: 2.7 g/dL — ABNORMAL LOW (ref 3.5–5.0)
Alkaline Phosphatase: 37 U/L — ABNORMAL LOW (ref 38–126)
Anion gap: 10 (ref 5–15)
BUN: 31 mg/dL — ABNORMAL HIGH (ref 8–23)
CO2: 28 mmol/L (ref 22–32)
Calcium: 8.5 mg/dL — ABNORMAL LOW (ref 8.9–10.3)
Chloride: 100 mmol/L (ref 98–111)
Creatinine, Ser: 0.71 mg/dL (ref 0.44–1.00)
GFR calc Af Amer: >60 mL/min (ref >60)
GFR calc non Af Amer: >60 mL/min (ref >60)
Glucose, Bld: 70 mg/dL (ref 70–99)
Potassium: 3.2 mmol/L — ABNORMAL LOW (ref 3.5–5.1)
Sodium: 138 mmol/L (ref 135–145)
Total Bilirubin: 0.2 mg/dL — ABNORMAL LOW (ref 0.3–1.2)
Total Protein: 5.9 g/dL — ABNORMAL LOW (ref 6.5–8.1)

## 2018-11-08 LAB — CBC WITH DIFFERENTIAL/PLATELET
Abs Immature Granulocytes: 0.08 10*3/uL — ABNORMAL HIGH (ref 0.00–0.07)
Basophils Absolute: 0 10*3/uL (ref 0.0–0.1)
Basophils Relative: 0 %
Eosinophils Absolute: 0.5 10*3/uL (ref 0.0–0.5)
Eosinophils Relative: 5 %
HCT: 37.5 % (ref 36.0–46.0)
Hemoglobin: 11.5 g/dL — ABNORMAL LOW (ref 12.0–15.0)
Immature Granulocytes: 1 %
Lymphocytes Relative: 17 %
Lymphs Abs: 1.8 10*3/uL (ref 0.7–4.0)
MCH: 30.3 pg (ref 26.0–34.0)
MCHC: 30.7 g/dL (ref 30.0–36.0)
MCV: 98.7 fL (ref 80.0–100.0)
Monocytes Absolute: 0.9 10*3/uL (ref 0.1–1.0)
Monocytes Relative: 9 %
Neutro Abs: 7 10*3/uL (ref 1.7–7.7)
Neutrophils Relative %: 68 %
Platelets: 328 10*3/uL (ref 150–400)
RBC: 3.8 MIL/uL — ABNORMAL LOW (ref 3.87–5.11)
RDW: 16.2 % — ABNORMAL HIGH (ref 11.5–15.5)
WBC: 10.3 10*3/uL (ref 4.0–10.5)
nRBC: 0 % (ref 0.0–0.2)

## 2018-11-08 LAB — GLUCOSE, CAPILLARY
Glucose-Capillary: 83 mg/dL (ref 70–99)
Glucose-Capillary: 303 mg/dL — ABNORMAL HIGH (ref 70–99)
Glucose-Capillary: 166 mg/dL — ABNORMAL HIGH (ref 70–99)
Glucose-Capillary: 85 mg/dL (ref 70–99)

## 2018-11-08 LAB — FERRITIN: Ferritin: 206 ng/mL (ref 11–307)

## 2018-11-08 LAB — MAGNESIUM: Magnesium: 2.4 mg/dL (ref 1.7–2.4)

## 2018-11-08 LAB — D-DIMER, QUANTITATIVE: D-Dimer, Quant: 0.49 ug/mL-FEU (ref 0.00–0.50)

## 2018-11-08 LAB — C-REACTIVE PROTEIN: CRP: <0.8 mg/dL (ref <1.0)

## 2018-11-08 MED ORDER — POTASSIUM CHLORIDE CRYS ER 20 MEQ PO TBCR
40.0000 meq | EXTENDED_RELEASE_TABLET | Freq: Two times a day (BID) | ORAL | Status: AC
  Administered 2018-11-08 (×2): 40 meq via ORAL
  Filled 2018-11-08 (×2): qty 2

## 2018-11-08 NOTE — Progress Notes (Signed)
PROGRESS NOTE  Christina Clark  ZOX:[id number] DOB: 14-Sep-1950 DOA: 10/10/2018 PCP: Patient, No Pcp Per   Brief Narrative: Christina Clark is a 68 y.o. female with PMHx of morbid obesity, HTN, depression-presenting with acute hypoxemic respiratory failure secondary to COVID-19 pneumonia.  She was treated with Remdesivir, Actemra and steroids-hospital course unfortunately has been prolonged and complicated by persistent hypoxemia.   Assessment & Plan: Principal Problem:   Acute respiratory failure with hypoxia (HCC) Active Problems:   Hypertension   Depression   Acute respiratory disease due to COVID-19 virus   Morbid obesity with BMI of 40.0-44.9, adult (HCC)   Diabetes mellitus type 2, uncontrolled (HCC)  Acute respiratory failure with hypoxia due to COVID-19 pneumonia:  - Completed remdesivir (6/11 - 6/15), given tocilizumab, continues on steroids.  - CRP, d-dimer, and ferritin have normalized, will stop checking. - Wean oxygen as tolerates, still requiring significant support and will remain inpatient. - Avoid NSAIDs - Continue airborne/contact precautions.  - Mobilize as much as possible. Discussed with granddaughter today that supplemental oxygen should be given to minimize dyspnea on exertion to maximize amount of mobilizing the patient is able/willing to accomplish. Prone as able. IS/FV.  LFT elevation: Very mild ALT elevation most likely due to viral infection, less likely drug effect. Has been stable. - Monitor intermittently.   Acute on chronic HFpEF: - Monitor I/O maintain even/negative balance.    Hypertension: Blood pressure stable.   - Continue home ARB and HCTZ.  Depression: Having a significant amount of anxiety while hospitalized.  - Continue scheduled low dose xanax started while inpatient. - Continue home zoloft  Morbid obesity: BMI 36   T2DM, uncontrolled with hyperglycemia: HbA1c of 8.5%. Not on medications at home.  - Due to AM  hypoglycemia, decreased levemir and give mealtime novolog in addition to SSI. AM CBG at goal. Will continue monitoring and titrate as indicated, hopeful for improved control once steroids can be tapered.   Leukocytosis: Suspect stress demargination and steroid effect, resolved  Hypokalemia: Mg wnl. - Supplement and monitor  DVT prophylaxis: Lovenox BID Code Status: Partial, DNI Family Communication: Granddaughter on phone throughout encounter Disposition Plan: CIR planned once hypoxia improves.  Consultants:   PCCM  Procedures:   None  Antimicrobials:  Remdesivir   Subjective: Got up to recliner today, placed on 20L HFNC. Felt very short of breath, about the same as yesterday. No new issues per patient. Granddaughter acts as Engineer, technical salesinterpretor per patient preference. I had a Spanish video interpretor in the room with us and she asked me to disconnect yesterday.   Objective: Vitals:   11/07/18 1630 11/07/18 1945 11/08/18 0021 11/08/18 0430  BP: (!) 112/53 (!) 108/51 (!) 113/52 (!) 103/52  Pulse: 76 77    Resp: (!) 25 16  (!) 21  Temp: 98 F (36.7 C) 98 F (36.7 C) 98.1 F (36.7 C) 98.1 F (36.7 C)  TempSrc:  Oral Oral Oral  SpO2: 98% 94%    Weight:      Height:        Intake/Output Summary (Last 24 hours) at 11/08/2018 0717 Last data filed at 11/07/2018 2200 Gross per 24 hour  Intake 972 ml  Output -  Net 972 ml   Filed Weights   10/10/18 2300 11/03/18 0255  Weight: 108.7 kg 97.6 kg   Gen: 68 y.o. female in no distress Pulm: Nonlabored but tachypneic ~26 throughout encounter. Clear bilaterally. CV: Regular rate and rhythm. No murmur, rub, or gallop. No JVD, no  dependent edema. GI: Abdomen soft, non-tender, non-distended, with normoactive bowel sounds.  Ext: Warm, no deformities Skin: No rashes, lesions or ulcers on visualized skin. Neuro: Alert and oriented. No focal neurological deficits. Psych: Judgement and insight appear fair. Mood euthymic & affect congruent.  Behavior is appropriate.    Data Reviewed: I have personally reviewed following labs and imaging studies  CBC: Recent Labs  Lab 11/04/18 0500 11/05/18 0500 11/06/18 0602 11/07/18 0950 11/08/18 0503  WBC 10.8* 11.3* 10.3 9.3 10.3  NEUTROABS 7.7 7.7 6.8 6.5 7.0  HGB 12.9 12.8 12.1 12.1 11.5*  HCT 40.8 39.6 38.1 38.0 37.5  MCV 98.1 97.8 97.9 99.0 98.7  PLT 232 269 296 306 625   Basic Metabolic Panel: Recent Labs  Lab 11/04/18 0500 11/05/18 0500 11/06/18 0602 11/07/18 0950 11/08/18 0503  NA 139 138 138 138 138  K 4.1 3.5 3.3* 3.1* 3.2*  CL 98 99 100 100 100  CO2 29 30 28 25 28   GLUCOSE 62* 77 66* 177* 70  BUN 32* 34* 31* 31* 31*  CREATININE 0.71 0.77 0.76 0.79 0.71  CALCIUM 8.8* 8.7* 8.7* 8.6* 8.5*  MG 2.6* 2.6* 2.4 2.3 2.4   GFR: Estimated Creatinine Clearance: 78.5 mL/min (by C-G formula based on SCr of 0.71 mg/dL). Liver Function Tests: Recent Labs  Lab 11/04/18 0500 11/05/18 0500 11/06/18 0602 11/07/18 0950 11/08/18 0503  AST 12* 17 17 19 18   ALT 37 41 43 45* 46*  ALKPHOS 48 43 40 40 37*  BILITOT 0.8 0.5 0.4 0.4 0.2*  PROT 6.7 6.4* 6.1* 5.9* 5.9*  ALBUMIN 2.8* 2.7* 2.7* 2.7* 2.7*   No results for input(s): LIPASE, AMYLASE in the last 168 hours. No results for input(s): AMMONIA in the last 168 hours. Coagulation Profile: No results for input(s): INR, PROTIME in the last 168 hours. Cardiac Enzymes: No results for input(s): CKTOTAL, CKMB, CKMBINDEX, TROPONINI in the last 168 hours. BNP (last 3 results) No results for input(s): PROBNP in the last 8760 hours. HbA1C: No results for input(s): HGBA1C in the last 72 hours. CBG: Recent Labs  Lab 11/06/18 2007 11/07/18 0757 11/07/18 1242 11/07/18 1653 11/07/18 2147  GLUCAP 193* 67* 237* 310* 126*   Lipid Profile: No results for input(s): CHOL, HDL, LDLCALC, TRIG, CHOLHDL, LDLDIRECT in the last 72 hours. Thyroid Function Tests: No results for input(s): TSH, T4TOTAL, FREET4, T3FREE, THYROIDAB in the  last 72 hours. Anemia Panel: Recent Labs    11/07/18 0955 11/08/18 0457  FERRITIN 261 206   Urine analysis:    Component Value Date/Time   COLORURINE YELLOW 10/24/2018 0721   APPEARANCEUR CLEAR 10/24/2018 0721   LABSPEC 1.025 10/24/2018 0721   PHURINE 5.0 10/24/2018 0721   GLUCOSEU NEGATIVE 10/24/2018 0721   HGBUR NEGATIVE 10/24/2018 0721   BILIRUBINUR NEGATIVE 10/24/2018 0721   KETONESUR NEGATIVE 10/24/2018 0721   PROTEINUR NEGATIVE 10/24/2018 0721   NITRITE NEGATIVE 10/24/2018 0721   LEUKOCYTESUR NEGATIVE 10/24/2018 0721   No results found for this or any previous visit (from the past 240 hour(s)).    Radiology Studies: No results found.  Scheduled Meds: . sodium chloride   Intravenous Once  . clonazepam  0.5 mg Oral BID  . enoxaparin (LOVENOX) injection  0.5 mg/kg Subcutaneous Q12H  . insulin aspart  0-20 Units Subcutaneous TID WC  . insulin aspart  3 Units Subcutaneous TID WC  . insulin detemir  10 Units Subcutaneous QHS  . methylPREDNISolone (SOLU-MEDROL) injection  40 mg Intravenous Daily  . pantoprazole  40  mg Oral Daily  . Ensure Max Protein  11 oz Oral BID  . sertraline  50 mg Oral Daily  . sodium chloride flush  10-40 mL Intracatheter Q12H   Continuous Infusions:   LOS: 29 days   Time spent: 25 minutes.  Tyrone Nineyan B Neoma Uhrich, MD [facility] [email or web address] Password [email or web address] 11/08/2018, 7:17 AM

## 2018-11-08 NOTE — Progress Notes (Signed)
Nutrition Follow-up RD working remotely.  DOCUMENTATION CODES:   Morbid obesity  INTERVENTION:    Continue Ensure Max po BID, each supplement provides 150 kcal, 30 grams of protein, & 6 grams carbohydrate.    Continue Hormel Shake daily with Breakfast which provides 520 kcals and 22 g of protein and Magic cup BID with lunch and dinner, each supplement provides 290 kcal and 9 grams of protein, automatically on meal trays to optimize nutritional intake.    NUTRITION DIAGNOSIS:   Increased nutrient needs related to acute illness as evidenced by estimated needs.  Ongoing  GOAL:   Patient will meet greater than or equal to 90% of their needs  Progressing  MONITOR:   PO intake, Supplement acceptance, Labs  ASSESSMENT:   68 yo Spanish speaking female admitted with COVID PNA. PMH of morbid obesity, HTN, depression.  Intake of meals has improved a little; meal completion 60-70% yesterday.  Labs and medications reviewed.  Potassium 3.2 (L) CBG's: 83-303  Diet Order:   Diet Order            DIET DYS 3 Room service appropriate? Yes; Fluid consistency: Thin  Diet effective now              EDUCATION NEEDS:   Not appropriate for education at this time  Skin:  Skin Assessment: Reviewed RN Assessment  Last BM:  7/8  Height:   Ht Readings from Last 1 Encounters:  10/10/18 5\' 4"  (1.626 m)    Weight:   Wt Readings from Last 1 Encounters:  11/03/18 97.6 kg    Ideal Body Weight:  54.5 kg  BMI:  Body mass index is 36.93 kg/m.  Estimated Nutritional Needs:   Kcal:  5573-2202  Protein:  100-125 gm  Fluid:  1.8 L    Molli Barrows, RD, LDN, CNSC Pager [phone] After Hours Pager [phone]

## 2018-11-08 NOTE — Progress Notes (Signed)
Physical Therapy Treatment Patient Details Name: Christina OuchMaria Reynoso Clark MRN: [id number] DOB: 02/10/1951 Today's Date: 11/08/2018    History of Present Illness 68 year old Spanish-speaking female with past medical history of morbid obesity, hypertension and depression  presenting to the emergency room 10/10/18 with 1 week of fever, cough and shortness of breath in the setting of known COVID-19 contacts.  Patient found to be COVID positive as well as hypoxic with bilateral infiltrates on chest x-ray.  Requiring oxygen support of high flow    PT Comments    The patient is very motivated to progress activity Patient on 10 L HFNC in recliner. Placed on 15 L for ambulation x 14' x 2 with rest break. Patient self monitors and slows breathing and relaxes. Patient recovers to 83-85% on 15 L much quicker. Patient less anxious about SOB. Placed back on 10 L HFNC.sign  Today is first time that patient has actually ambulated !!!!!patient is  proud of herself. .    Follow Up Recommendations  CIR     Equipment Recommendations  Rolling walker with 5" wheels    Recommendations for Other Services Rehab consult     Precautions / Restrictions Precautions Precautions: Fall Precaution Comments: on HFNC at 15 L.to amb    Mobility  Bed Mobility               General bed mobility comments: oob  Transfers   Equipment used: Rolling walker (2 wheeled) Transfers: Sit to/from Stand Sit to Stand: Min guard;Min assist Stand pivot transfers: Min guard       General transfer comment: recliner and BSC x 3, patient  requires min guard  Ambulation/Gait Ambulation/Gait assistance: Min assist;+2 safety/equipment Gait Distance (Feet): 14 Feet(x 2) Assistive device: Rolling walker (2 wheeled) Gait Pattern/deviations: Step-through pattern Gait velocity: decr   General Gait Details: patient on 15 L HFNC, slow steps, encouraged slow breathing, Sao2 dropping to 60% RR 35 but  recovered to 80's much  quicker than in previous sessions.   Stairs             Wheelchair Mobility    Modified Rankin (Stroke Patients Only)       Balance                                            Cognition                                       General Comments: Motivated to participate in therapy. Granddaughter on phone, patient much less anxious about mobilizing and not as in need for time to recover      Exercises      General Comments        Pertinent Vitals/Pain Pain Assessment: No/denies pain    Home Living                      Prior Function            PT Goals (current goals can now be found in the care plan section) Progress towards PT goals: Progressing toward goals    Frequency    Min 3X/week      PT Plan Current plan remains appropriate    Co-evaluation PT/OT/SLP Co-Evaluation/Treatment: Yes Reason for Co-Treatment: Complexity of the patient's  impairments (multi-system involvement);For patient/therapist safety PT goals addressed during session: Mobility/safety with mobility OT goals addressed during session: ADL's and self-care      AM-PAC PT "6 Clicks" Mobility   Outcome Measure  Help needed turning from your back to your side while in a flat bed without using bedrails?: A Little Help needed moving from lying on your back to sitting on the side of a flat bed without using bedrails?: A Little Help needed moving to and from a bed to a chair (including a wheelchair)?: A Little Help needed standing up from a chair using your arms (e.g., wheelchair or bedside chair)?: A Lot Help needed to walk in hospital room?: A Lot Help needed climbing 3-5 steps with a railing? : Total 6 Click Score: 14    End of Session Equipment Utilized During Treatment: Gait belt;Oxygen Activity Tolerance: Patient tolerated treatment well Patient left: in chair;with call bell/phone within reach Nurse Communication: Mobility status PT  Visit Diagnosis: Difficulty in walking, not elsewhere classified (R26.2)     Time: 0211-1552 PT Time Calculation (min) (ACUTE ONLY): 50 min  Charges:  $Gait Training: 23-37 mins                     Grace Pager [phone] Office [phone]    [name] 11/08/2018, 5:01 PM

## 2018-11-08 NOTE — Progress Notes (Signed)
Alert and oriented  x 4. Out of bed and sitting in recliner. O2 SAT 94-95% at rest on 10 liters nasal canula. Dyspnea on exertion noted O2 SAT in the 80's. Uses rolling walker to rise from sitting position independent. Call bell within reach. Encouraged to report signs and symptoms to staff.

## 2018-11-09 LAB — GLUCOSE, CAPILLARY
Glucose-Capillary: 80 mg/dL (ref 70–99)
Glucose-Capillary: 188 mg/dL — ABNORMAL HIGH (ref 70–99)
Glucose-Capillary: 181 mg/dL — ABNORMAL HIGH (ref 70–99)
Glucose-Capillary: 220 mg/dL — ABNORMAL HIGH (ref 70–99)

## 2018-11-09 LAB — COMPREHENSIVE METABOLIC PANEL
ALT: 49 U/L — ABNORMAL HIGH (ref 0–44)
AST: 17 U/L (ref 15–41)
Albumin: 2.6 g/dL — ABNORMAL LOW (ref 3.5–5.0)
Alkaline Phosphatase: 35 U/L — ABNORMAL LOW (ref 38–126)
Anion gap: 7 (ref 5–15)
BUN: 30 mg/dL — ABNORMAL HIGH (ref 8–23)
CO2: 27 mmol/L (ref 22–32)
Calcium: 8.5 mg/dL — ABNORMAL LOW (ref 8.9–10.3)
Chloride: 105 mmol/L (ref 98–111)
Creatinine, Ser: 0.7 mg/dL (ref 0.44–1.00)
GFR calc Af Amer: >60 mL/min (ref >60)
GFR calc non Af Amer: >60 mL/min (ref >60)
Glucose, Bld: 77 mg/dL (ref 70–99)
Potassium: 4.3 mmol/L (ref 3.5–5.1)
Sodium: 139 mmol/L (ref 135–145)
Total Bilirubin: 0.3 mg/dL (ref 0.3–1.2)
Total Protein: 5.8 g/dL — ABNORMAL LOW (ref 6.5–8.1)

## 2018-11-09 LAB — CBC WITH DIFFERENTIAL/PLATELET
Abs Immature Granulocytes: 0.13 10*3/uL — ABNORMAL HIGH (ref 0.00–0.07)
Basophils Absolute: 0 10*3/uL (ref 0.0–0.1)
Basophils Relative: 0 %
Eosinophils Absolute: 0.4 10*3/uL (ref 0.0–0.5)
Eosinophils Relative: 4 %
HCT: 35.7 % — ABNORMAL LOW (ref 36.0–46.0)
Hemoglobin: 11.3 g/dL — ABNORMAL LOW (ref 12.0–15.0)
Immature Granulocytes: 1 %
Lymphocytes Relative: 17 %
Lymphs Abs: 2 10*3/uL (ref 0.7–4.0)
MCH: 31.2 pg (ref 26.0–34.0)
MCHC: 31.7 g/dL (ref 30.0–36.0)
MCV: 98.6 fL (ref 80.0–100.0)
Monocytes Absolute: 1 10*3/uL (ref 0.1–1.0)
Monocytes Relative: 9 %
Neutro Abs: 7.9 10*3/uL — ABNORMAL HIGH (ref 1.7–7.7)
Neutrophils Relative %: 69 %
Platelets: 346 10*3/uL (ref 150–400)
RBC: 3.62 MIL/uL — ABNORMAL LOW (ref 3.87–5.11)
RDW: 16.5 % — ABNORMAL HIGH (ref 11.5–15.5)
WBC: 11.4 10*3/uL — ABNORMAL HIGH (ref 4.0–10.5)
nRBC: 0 % (ref 0.0–0.2)

## 2018-11-09 MED ORDER — INSULIN ASPART 100 UNIT/ML ~~LOC~~ SOLN
5.0000 [IU] | Freq: Three times a day (TID) | SUBCUTANEOUS | Status: DC
  Administered 2018-11-09 – 2018-11-14 (×15): 5 [IU] via SUBCUTANEOUS

## 2018-11-09 NOTE — Progress Notes (Signed)
Occupational Therapy Treatment Note- late entry  Pt seen as cotreat for partial session during ambulation. Pt able to ambulate 14 ft to window. Extended rest break before ambulating back to chair. Min guard with BSC transfer. Pt mobilized on 15L due to desat with any exertion. Desats into 60s, but quicker to rebound into 80s with calm pursed lip breathing. Pt dramatically improved with her ability to control/slow her breathing after exertion. Pt continues to be very motivated with therapy. Grand daughter interpreted during session.     11/08/18 1700  OT Visit Information  Last OT Received On 11/09/18  Assistance Needed +2  PT/OT/SLP Co-Evaluation/Treatment Yes  OT goals addressed during session ADL's and self-care  History of Present Illness 68 year old Spanish-speaking female with past medical history of morbid obesity, hypertension and depression  presenting to the emergency room 10/10/18 with 1 week of fever, cough and shortness of breath in the setting of known COVID-19 contacts.  Patient found to be COVID positive as well as hypoxic with bilateral infiltrates on chest x-ray.  Requiring oxygen support of high flow  Precautions  Precautions Fall  Precaution Comments on HFNC at 15 L.to amb  Pain Assessment  Pain Assessment No/denies pain  Cognition  Arousal/Alertness Awake/alert  Behavior During Therapy WFL for tasks assessed/performed  Overall Cognitive Status Within Functional Limits for tasks assessed  Upper Extremity Assessment  Upper Extremity Assessment Generalized weakness  Lower Extremity Assessment  Lower Extremity Assessment Defer to PT evaluation  ADL  Overall ADL's  Needs assistance/impaired  Grooming Set up;Sitting  Toilet Transfer RW;Min guard;BSC (Stood and took a few steps to the [facility])  Toileting- Music therapist guard;Sit to/from stand  Functional mobility during ADLs Minimal assistance;Rolling walker  General ADL Comments Motivated to complete  self care. Transfers quickly due to dyspnea  Bed Mobility  General bed mobility comments OOB in recliner  Balance  Overall balance assessment Needs assistance  Sitting balance-Leahy Scale Good  Standing balance-Leahy Scale Fair  Vision- Assessment  Additional Comments poor vision at baseline  Transfers  Equipment used Rolling walker (2 wheeled)  Transfers Sit to/from Stand  Sit to Stand Min guard;Min assist  Stand pivot transfers Min guard  General transfer comment moves quickly for transfers due to dyspnea; Able to take several steps to [facility]  General Comments  General comments (skin integrity, edema, etc.) Very motivated to participate with therapy. Very appreciative.  Other Exercises  Other Exercises Pt completing theraband exercises on her own  OT - End of Session  Equipment Utilized During Treatment Oxygen (15L for mobility)  Activity Tolerance Patient tolerated treatment well  Patient left in chair;with call bell/phone within reach  Nurse Communication Mobility status  OT Assessment/Plan  OT Plan Discharge plan remains appropriate  OT Visit Diagnosis Unsteadiness on feet (R26.81);Muscle weakness (generalized) (M62.81)  OT Frequency (ACUTE ONLY) Min 3X/week  Recommendations for Other Services Rehab consult  Follow Up Recommendations CIR;Supervision/Assistance - 24 hour  OT Equipment 3 in 1 bedside commode  AM-PAC OT "6 Clicks" Daily Activity Outcome Measure (Version 2)  Help from another person eating meals? 4  Help from another person taking care of personal grooming? 3  Help from another person toileting, which includes using toliet, bedpan, or urinal? 3  Help from another person bathing (including washing, rinsing, drying)? 2  Help from another person to put on and taking off regular upper body clothing? 3  Help from another person to put on and taking off regular lower body clothing? 2  6 Click Score 17  OT Goal Progression  Progress towards OT goals Progressing toward  goals  Acute Rehab OT Goals  Patient Stated Goal to go home  OT Goal Formulation With patient/family  Time For Goal Achievement 11/19/18  Potential to Achieve Goals Good  ADL Goals  Pt Will Perform Upper Body Bathing with modified independence;sitting  Pt Will Perform Lower Body Bathing with modified independence;sit to/from stand;with adaptive equipment  Pt Will Perform Lower Body Dressing with modified independence;sit to/from stand;with adaptive equipment  Pt Will Transfer to Toilet with modified independence;ambulating;bedside commode  Pt Will Perform Toileting - Clothing Manipulation and hygiene with modified independence;sit to/from stand  Pt/caregiver will Perform Home Exercise Program Increased strength;With theraband;Both right and left upper extremity;With Supervision;With written HEP provided  Additional ADL Goal #1 Pt will independently verbalize 3 energy conservation strategies for ADL  OT Time Calculation  OT Start Time (ACUTE ONLY) 1539  OT Stop Time (ACUTE ONLY) 1635  OT Time Calculation (min) 56 min  OT General Charges  $OT Visit 1 Visit  Luisa DagoHilary Adilenne Ashworth, OT/L   Acute OT Clinical Specialist Acute Rehabilitation Services Pager [phone] Office [phone]

## 2018-11-09 NOTE — Progress Notes (Signed)
Alert and oriented x 4. Out of bed and sitting in the recliner. On 10 liters nasal canula. Stands independently using rolling walker. During positioning dyspnea on exertion present. Once at rest O2 SAT returns to normal range 94-96%. Call bell within reach. Encouraged to report signs and symptoms to RN.

## 2018-11-09 NOTE — Progress Notes (Signed)
Occupational Therapy Treatment Patient Details Name: Iran OuchMaria Reynoso Tirado MRN: [id number] DOB: [birth year] Today's Date: 11/09/2018    History of present illness 68 year old Spanish-speaking female with past medical history of morbid obesity, hypertension and depression  presenting to the emergency room 10/10/18 with 1 week of fever, cough and shortness of breath in the setting of known COVID-19 contacts.  Patient found to be COVID positive as well as hypoxic with bilateral infiltrates on chest x-ray.  Requiring oxygen support of high flow   OT comments  Completing BSC trasnfer with min guard assist and able to take several steps to chair. Assist with ADL required due ot poor activity tolerance. Desat into 70s but rebounds into high 80s in 2-3 min, which continues to improve. Pt with excellent participation. Face timed family at end of session.    Follow Up Recommendations  CIR;Supervision/Assistance - 24 hour    Equipment Recommendations  3 in 1 bedside commode    Recommendations for Other Services Rehab consult    Precautions / Restrictions Precautions Precautions: Fall Precaution Comments: HFNC 10L       Mobility Bed Mobility Overal bed mobility: Needs Assistance Bed Mobility: Supine to Sit     Supine to sit: Min assist        Transfers       Sit to Stand: Min guard Stand pivot transfers: Min guard       General transfer comment: moves quickly for transfers due to dyspnea; [facility]    Balance     Sitting balance-Leahy Scale: Good       Standing balance-Leahy Scale: Fair                             ADL either performed or assessed with clinical judgement   ADL Overall ADL's : Needs assistance/impaired     Grooming: Set up;Sitting                   Toilet Transfer: Min guard;BSC;Stand-pivot   Toileting- Clothing Manipulation and Hygiene: Minimal assistance;Sit to/from stand Toileting - Clothing  Manipulation Details (indicate cue type and reason): A to thoroughly clean after BM due to endurance     Functional mobility during ADLs: Minimal assistance       Vision   Additional Comments: poor vision at baseline   Perception     Praxis      Cognition Arousal/Alertness: Awake/alert Behavior During Therapy: WFL for tasks assessed/performed Overall Cognitive Status: Within Functional Limits for tasks assessed                                          Exercises General Exercises - Upper Extremity Shoulder Flexion: Strengthening;Both;20 reps;Theraband Theraband Level (Shoulder Flexion): Level 1 (Yellow) Shoulder ABduction: Strengthening;20 reps;Seated;Theraband Theraband Level (Shoulder Abduction): Level 1 (Yellow)   Shoulder Instructions       General Comments      Pertinent Vitals/ Pain       Pain Assessment: No/denies pain  Home Living                                          Prior Functioning/Environment              Frequency  Min 3X/week        Progress Toward Goals  OT Goals(current goals can now be found in the care plan section)  Progress towards OT goals: Progressing toward goals  Acute Rehab OT Goals Patient Stated Goal: to go home OT Goal Formulation: With patient/family Time For Goal Achievement: 11/19/18 Potential to Achieve Goals: Good ADL Goals Pt Will Perform Upper Body Bathing: with modified independence;sitting Pt Will Perform Lower Body Bathing: with modified independence;sit to/from stand;with adaptive equipment Pt Will Perform Lower Body Dressing: with modified independence;sit to/from stand;with adaptive equipment Pt Will Transfer to Toilet: with modified independence;ambulating;bedside commode Pt Will Perform Toileting - Clothing Manipulation and hygiene: with modified independence;sit to/from stand Pt/caregiver will Perform Home Exercise Program: Increased strength;With theraband;Both right  and left upper extremity;With Supervision;With written HEP provided Additional ADL Goal #1: Pt will independently verbalize 3 energy conservation strategies for ADL  Plan Discharge plan remains appropriate    Co-evaluation                 AM-PAC OT "6 Clicks" Daily Activity     Outcome Measure   Help from another person eating meals?: None Help from another person taking care of personal grooming?: A Little Help from another person toileting, which includes using toliet, bedpan, or urinal?: A Little Help from another person bathing (including washing, rinsing, drying)?: A Lot Help from another person to put on and taking off regular upper body clothing?: A Little Help from another person to put on and taking off regular lower body clothing?: A Lot 6 Click Score: 17    End of Session Equipment Utilized During Treatment: Oxygen(10L)  OT Visit Diagnosis: Unsteadiness on feet (R26.81);Muscle weakness (generalized) (M62.81)   Activity Tolerance Patient tolerated treatment well   Patient Left in chair;with call bell/phone within reach   Nurse Communication Mobility status        Time: 6063-0160 OT Time Calculation (min): 29 min  Charges: OT General Charges $OT Visit: 1 Visit OT Treatments $Self Care/Home Management : 23-37 mins  Maurie Boettcher, OT/L   Acute OT Clinical Specialist [place] Pager [phone] Office [phone]    [facility] 11/09/2018, 7:19 PM

## 2018-11-09 NOTE — Progress Notes (Signed)
Inpatient Rehab Admissions Coordinator:   Note pt continuing to progress well with therapy (min assist x14' and min guard for transfers as of 7/9).  Weaning O2 requirements slowly.  By the time her O2 requirements have decreased enough for CIR, she may be functioning well enough to go home.  I will continue to follow her progress.   Shann Medal, PT, DPT Admissions Coordinator [phone] 11/09/18  8:58 AM

## 2018-11-09 NOTE — Progress Notes (Addendum)
PROGRESS NOTE  Christina Clark  MVH:[id number] DOB: Jun 09, 1950 DOA: 10/10/2018 PCP: Patient, No Pcp Per   Brief Narrative: Christina Clark is a 68 y.o. female with PMHx of morbid obesity, HTN, depression-presenting with acute hypoxemic respiratory failure secondary to COVID-19 pneumonia.  She was treated with Remdesivir, Actemra and steroids-hospital course unfortunately has been prolonged and complicated by persistent hypoxemia.   Assessment & Plan: Principal Problem:   Acute respiratory failure with hypoxia (HCC) Active Problems:   Hypertension   Depression   Acute respiratory disease due to COVID-19 virus   Morbid obesity with BMI of 40.0-44.9, adult (HCC)   Diabetes mellitus type 2, uncontrolled (HCC)  Acute respiratory failure with hypoxia due to COVID-19 pneumonia: Overall improving, down to 10L supplemental oxygen.  - Completed remdesivir (6/11 - 6/15), given tocilizumab, continues on steroids.  - CRP, d-dimer, and ferritin have normalized, will stop checking. - Wean oxygen as tolerates, still requiring significant support and will remain inpatient. - Avoid NSAIDs - Continue airborne/contact precautions.  - Mobilize as much as possible. Discussed with granddaughter today that supplemental oxygen should be given to minimize dyspnea on exertion to maximize amount of mobilizing the patient is able/willing to accomplish. Prone as able. IS/FV.  LFT elevation: Very mild ALT elevation most likely due to viral infection, less likely drug effect. Has been stable. - Monitor intermittently.   Acute on chronic HFpEF: - Monitor I/O maintain even/negative balance.    Hypertension: Blood pressure stable.   - Continue home ARB and HCTZ.  Depression: Having a significant amount of anxiety while hospitalized.  - Continue scheduled low dose xanax started while inpatient. - Continue home zoloft  Morbid obesity: BMI 36   T2DM, uncontrolled with hyperglycemia: HbA1c of 8.5%.  Not on medications at home.  - Due to AM hypoglycemia, decreased levemir and give mealtime novolog in addition to SSI. AM CBG at goal but postprandial elevations continue, will increase mealtime insulin to 5u TIDWC.  Leukocytosis: Suspect stress demargination and steroid effect, stable.  Hypokalemia: Mg wnl. - Supplement and monitor  DVT prophylaxis: Lovenox BID Code Status: Partial, DNI Family Communication: Granddaughter on phone throughout encounter again today Disposition Plan: CIR planned once hypoxia improves. Note suggests that mobility may be good enough to discharge home by the time hypoxia improves enough.  Consultants:   PCCM  Procedures:   None  Antimicrobials:  Remdesivir   Subjective: Ambulated yesterday for the first time and oxygen requirements have come down modestly, currently 10L. Pt without complaints this morning, sitting in chair.  Objective: Vitals:   11/09/18 0453 11/09/18 0740 11/09/18 0815 11/09/18 1154  BP: (!) 126/53 122/61 118/64 (!) 118/54  Pulse: 95  75 (!) 103  Resp: (!) 22  20 (!) 24  Temp: 98.6 F (37 C) 98.6 F (37 C)  98.4 F (36.9 C)  TempSrc: Oral Oral  Oral  SpO2: 92%  93% 93%  Weight:      Height:        Intake/Output Summary (Last 24 hours) at 11/09/2018 1212 Last data filed at 11/09/2018 0854 Gross per 24 hour  Intake 360 ml  Output -  Net 360 ml   Filed Weights   10/10/18 2300 11/03/18 0255  Weight: 108.7 kg 97.6 kg   Gen: 68 y.o. female in no distress Pulm: Nonlabored but tachypneic into mid 20's with 10LPM O2. Clear. CV: Regular rate and rhythm. No murmur, rub, or gallop. No JVD, no dependent edema. GI: Abdomen soft, non-tender, non-distended, with normoactive  bowel sounds.  Ext: Warm, no deformities Skin: No rashes, lesions or ulcers on visualized skin. Neuro: Alert and oriented. No focal neurological deficits. Psych: Judgement and insight appear fair. Mood euthymic & affect congruent. Behavior is appropriate.     Data Reviewed: I have personally reviewed following labs and imaging studies  CBC: Recent Labs  Lab 11/05/18 0500 11/06/18 0602 11/07/18 0950 11/08/18 0503 11/09/18 0446  WBC 11.3* 10.3 9.3 10.3 11.4*  NEUTROABS 7.7 6.8 6.5 7.0 7.9*  HGB 12.8 12.1 12.1 11.5* 11.3*  HCT 39.6 38.1 38.0 37.5 35.7*  MCV 97.8 97.9 99.0 98.7 98.6  PLT 269 296 306 328 875   Basic Metabolic Panel: Recent Labs  Lab 11/04/18 0500 11/05/18 0500 11/06/18 0602 11/07/18 0950 11/08/18 0503 11/09/18 0446  NA 139 138 138 138 138 139  K 4.1 3.5 3.3* 3.1* 3.2* 4.3  CL 98 99 100 100 100 105  CO2 29 30 28 25 28 27   GLUCOSE 62* 77 66* 177* 70 77  BUN 32* 34* 31* 31* 31* 30*  CREATININE 0.71 0.77 0.76 0.79 0.71 0.70  CALCIUM 8.8* 8.7* 8.7* 8.6* 8.5* 8.5*  MG 2.6* 2.6* 2.4 2.3 2.4  --    GFR: Estimated Creatinine Clearance: 78.5 mL/min (by C-G formula based on SCr of 0.7 mg/dL). Liver Function Tests: Recent Labs  Lab 11/05/18 0500 11/06/18 0602 11/07/18 0950 11/08/18 0503 11/09/18 0446  AST 17 17 19 18 17   ALT 41 43 45* 46* 49*  ALKPHOS 43 40 40 37* 35*  BILITOT 0.5 0.4 0.4 0.2* 0.3  PROT 6.4* 6.1* 5.9* 5.9* 5.8*  ALBUMIN 2.7* 2.7* 2.7* 2.7* 2.6*   No results for input(s): LIPASE, AMYLASE in the last 168 hours. No results for input(s): AMMONIA in the last 168 hours. Coagulation Profile: No results for input(s): INR, PROTIME in the last 168 hours. Cardiac Enzymes: No results for input(s): CKTOTAL, CKMB, CKMBINDEX, TROPONINI in the last 168 hours. BNP (last 3 results) No results for input(s): PROBNP in the last 8760 hours. HbA1C: No results for input(s): HGBA1C in the last 72 hours. CBG: Recent Labs  Lab 11/08/18 1152 11/08/18 1706 11/08/18 2155 11/09/18 0738 11/09/18 1119  GLUCAP 303* 166* 85 80 188*   Lipid Profile: No results for input(s): CHOL, HDL, LDLCALC, TRIG, CHOLHDL, LDLDIRECT in the last 72 hours. Thyroid Function Tests: No results for input(s): TSH, T4TOTAL, FREET4,  T3FREE, THYROIDAB in the last 72 hours. Anemia Panel: Recent Labs    11/07/18 0955 11/08/18 0457  FERRITIN 261 206   Urine analysis:    Component Value Date/Time   COLORURINE YELLOW 10/24/2018 0721   APPEARANCEUR CLEAR 10/24/2018 0721   LABSPEC 1.025 10/24/2018 0721   PHURINE 5.0 10/24/2018 0721   GLUCOSEU NEGATIVE 10/24/2018 0721   HGBUR NEGATIVE 10/24/2018 0721   BILIRUBINUR NEGATIVE 10/24/2018 0721   KETONESUR NEGATIVE 10/24/2018 0721   PROTEINUR NEGATIVE 10/24/2018 0721   NITRITE NEGATIVE 10/24/2018 0721   LEUKOCYTESUR NEGATIVE 10/24/2018 0721   No results found for this or any previous visit (from the past 240 hour(s)).    Radiology Studies: No results found.  Scheduled Meds: . sodium chloride   Intravenous Once  . clonazepam  0.5 mg Oral BID  . enoxaparin (LOVENOX) injection  0.5 mg/kg Subcutaneous Q12H  . insulin aspart  0-20 Units Subcutaneous TID WC  . insulin aspart  3 Units Subcutaneous TID WC  . insulin detemir  10 Units Subcutaneous QHS  . methylPREDNISolone (SOLU-MEDROL) injection  40 mg Intravenous Daily  .  pantoprazole  40 mg Oral Daily  . Ensure Max Protein  11 oz Oral BID  . sertraline  50 mg Oral Daily  . sodium chloride flush  10-40 mL Intracatheter Q12H   Continuous Infusions:   LOS: 30 days   Time spent: 25 minutes.  Tyrone Nineyan B Grunz, MD [facility] [email or web address] Password [email or web address] 11/09/2018, 12:12 PM

## 2018-11-10 LAB — GLUCOSE, CAPILLARY
Glucose-Capillary: 96 mg/dL (ref 70–99)
Glucose-Capillary: 186 mg/dL — ABNORMAL HIGH (ref 70–99)
Glucose-Capillary: 257 mg/dL — ABNORMAL HIGH (ref 70–99)
Glucose-Capillary: 168 mg/dL — ABNORMAL HIGH (ref 70–99)

## 2018-11-10 MED ORDER — METHYLPREDNISOLONE SODIUM SUCC 40 MG IJ SOLR
20.0000 mg | Freq: Every day | INTRAMUSCULAR | Status: DC
  Administered 2018-11-11 – 2018-11-13 (×3): 20 mg via INTRAVENOUS
  Filled 2018-11-10 (×4): qty 1

## 2018-11-10 NOTE — Progress Notes (Signed)
PROGRESS NOTE  Christina Clark  ZOX:[id number] DOB: [birth year] DOA: 10/10/2018 PCP: Patient, No Pcp Per   Brief Narrative: Christina Clark is a 68 y.o. female with PMHx of morbid obesity, HTN, depression-presenting with acute hypoxemic respiratory failure secondary to COVID-19 pneumonia.  She was treated with Remdesivir, Actemra and steroids-hospital course unfortunately has been prolonged and complicated by persistent hypoxemia.   Assessment & Plan: Principal Problem:   Acute respiratory failure with hypoxia (HCC) Active Problems:   Hypertension   Depression   Acute respiratory disease due to COVID-19 virus   Morbid obesity with BMI of 40.0-44.9, adult (HCC)   Diabetes mellitus type 2, uncontrolled (HCC)  Acute respiratory failure with hypoxia due to COVID-19 pneumonia: Overall improving, down to 10L supplemental oxygen.  - Completed remdesivir (6/11 - 6/15), given tocilizumab, continues on steroids, tapered to 40mg  IV daily x5 days, will start 20mg  dose 7/12.  - CRP, d-dimer, and ferritin have normalized, will stop checking. - Wean oxygen as tolerates, still requiring significant support and will remain inpatient. - Avoid NSAIDs - Continue airborne/contact precautions.  - Mobilize as much as possible. - Prone as able. IS/FV.  LFT elevation: Very mild ALT elevation most likely due to viral infection, less likely drug effect. Has been stable. - Monitor intermittently.   Acute on chronic HFpEF: - Monitor I/O maintain even/negative balance.    Hypertension: Blood pressure stable.   - Continue home ARB and HCTZ.  Depression: Having a significant amount of anxiety while hospitalized.  - Continue scheduled low dose xanax started while inpatient. - Continue home zoloft  Morbid obesity: BMI 36   T2DM, uncontrolled with hyperglycemia: HbA1c of 8.5%. Not on medications at home.  - CBGs coming under better control, will not change today. Anticipate improvement with  tapering steroids.  Leukocytosis: Suspect stress demargination and steroid effect, stable.  Hypokalemia: Mg wnl. - Supplement and monitor  DVT prophylaxis: Lovenox BID Code Status: Partial, DNI Family Communication: Granddaughter on phone throughout encounter today Disposition Plan: CIR planned once hypoxia improves. Note suggests that mobility may be good enough to discharge home by the time hypoxia improves enough.  Consultants:   PCCM  Procedures:   None  Antimicrobials:  Remdesivir   Subjective: No new events, down to 8L in recliner today. Feeling generally better.  Objective: Vitals:   11/10/18 0500 11/10/18 0600 11/10/18 0929 11/10/18 1157  BP:   117/62 (!) 102/57  Pulse: 72 82 88 70  Resp: (!) 21 (!) 23 (!) 24 (!) 22  Temp:   98.3 F (36.8 C)   TempSrc:   Oral   SpO2: 96% 94% 100% 99%  Weight:      Height:        Intake/Output Summary (Last 24 hours) at 11/10/2018 1344 Last data filed at 11/10/2018 1043 Gross per 24 hour  Intake 960 ml  Output 350 ml  Net 610 ml   Filed Weights   10/10/18 2300 11/03/18 0255  Weight: 108.7 kg 97.6 kg   Gen: 68 y.o. female in no distress Pulm: Nonlabored without accessory muscle use. Clear. CV: Regular rate and rhythm. No murmur, rub, or gallop. No JVD, no dependent edema. GI: Abdomen soft, non-tender, non-distended, with normoactive bowel sounds.  Ext: Warm, no deformities Skin: No rashes, lesions or ulcers on visualized skin. Neuro: Alert and oriented. No focal neurological deficits. Psych: Judgement and insight appear fair. Mood euthymic & affect congruent. Behavior is appropriate.    Data Reviewed: I have personally reviewed following labs  and imaging studies  CBC: Recent Labs  Lab 11/05/18 0500 11/06/18 0602 11/07/18 0950 11/08/18 0503 11/09/18 0446  WBC 11.3* 10.3 9.3 10.3 11.4*  NEUTROABS 7.7 6.8 6.5 7.0 7.9*  HGB 12.8 12.1 12.1 11.5* 11.3*  HCT 39.6 38.1 38.0 37.5 35.7*  MCV 97.8 97.9 99.0 98.7  98.6  PLT 269 296 306 328 259   Basic Metabolic Panel: Recent Labs  Lab 11/04/18 0500 11/05/18 0500 11/06/18 0602 11/07/18 0950 11/08/18 0503 11/09/18 0446  NA 139 138 138 138 138 139  K 4.1 3.5 3.3* 3.1* 3.2* 4.3  CL 98 99 100 100 100 105  CO2 29 30 28 25 28 27   GLUCOSE 62* 77 66* 177* 70 77  BUN 32* 34* 31* 31* 31* 30*  CREATININE 0.71 0.77 0.76 0.79 0.71 0.70  CALCIUM 8.8* 8.7* 8.7* 8.6* 8.5* 8.5*  MG 2.6* 2.6* 2.4 2.3 2.4  --    GFR: Estimated Creatinine Clearance: 78.5 mL/min (by C-G formula based on SCr of 0.7 mg/dL). Liver Function Tests: Recent Labs  Lab 11/05/18 0500 11/06/18 0602 11/07/18 0950 11/08/18 0503 11/09/18 0446  AST 17 17 19 18 17   ALT 41 43 45* 46* 49*  ALKPHOS 43 40 40 37* 35*  BILITOT 0.5 0.4 0.4 0.2* 0.3  PROT 6.4* 6.1* 5.9* 5.9* 5.8*  ALBUMIN 2.7* 2.7* 2.7* 2.7* 2.6*   No results for input(s): LIPASE, AMYLASE in the last 168 hours. No results for input(s): AMMONIA in the last 168 hours. Coagulation Profile: No results for input(s): INR, PROTIME in the last 168 hours. Cardiac Enzymes: No results for input(s): CKTOTAL, CKMB, CKMBINDEX, TROPONINI in the last 168 hours. BNP (last 3 results) No results for input(s): PROBNP in the last 8760 hours. HbA1C: No results for input(s): HGBA1C in the last 72 hours. CBG: Recent Labs  Lab 11/09/18 1119 11/09/18 1654 11/09/18 2127 11/10/18 0758 11/10/18 1124  GLUCAP 188* 181* 220* 96 186*   Lipid Profile: No results for input(s): CHOL, HDL, LDLCALC, TRIG, CHOLHDL, LDLDIRECT in the last 72 hours. Thyroid Function Tests: No results for input(s): TSH, T4TOTAL, FREET4, T3FREE, THYROIDAB in the last 72 hours. Anemia Panel: Recent Labs    11/08/18 0457  FERRITIN 206   Urine analysis:    Component Value Date/Time   COLORURINE YELLOW 10/24/2018 0721   APPEARANCEUR CLEAR 10/24/2018 0721   LABSPEC 1.025 10/24/2018 0721   PHURINE 5.0 10/24/2018 0721   GLUCOSEU NEGATIVE 10/24/2018 0721   HGBUR  NEGATIVE 10/24/2018 0721   BILIRUBINUR NEGATIVE 10/24/2018 0721   KETONESUR NEGATIVE 10/24/2018 0721   PROTEINUR NEGATIVE 10/24/2018 0721   NITRITE NEGATIVE 10/24/2018 0721   LEUKOCYTESUR NEGATIVE 10/24/2018 0721   No results found for this or any previous visit (from the past 240 hour(s)).    Radiology Studies: No results found.  Scheduled Meds: . sodium chloride   Intravenous Once  . clonazepam  0.5 mg Oral BID  . enoxaparin (LOVENOX) injection  0.5 mg/kg Subcutaneous Q12H  . insulin aspart  0-20 Units Subcutaneous TID WC  . insulin aspart  5 Units Subcutaneous TID WC  . insulin detemir  10 Units Subcutaneous QHS  . methylPREDNISolone (SOLU-MEDROL) injection  40 mg Intravenous Daily  . pantoprazole  40 mg Oral Daily  . Ensure Max Protein  11 oz Oral BID  . sertraline  50 mg Oral Daily  . sodium chloride flush  10-40 mL Intracatheter Q12H   Continuous Infusions:   LOS: 31 days   Time spent: 25 minutes.  Gwyn Hieronymus  Dario GuardianB Soul Hackman, MD [facility] [email or web address] Password [email or web address] 11/10/2018, 1:44 PM

## 2018-11-10 NOTE — Progress Notes (Signed)
Patient updates provided to patient's daughter Tyisha, [phone].  Family denies further questions asked and reported patient's appreciation for "good and very nice care."

## 2018-11-11 LAB — GLUCOSE, CAPILLARY
Glucose-Capillary: 76 mg/dL (ref 70–99)
Glucose-Capillary: 209 mg/dL — ABNORMAL HIGH (ref 70–99)
Glucose-Capillary: 217 mg/dL — ABNORMAL HIGH (ref 70–99)
Glucose-Capillary: 118 mg/dL — ABNORMAL HIGH (ref 70–99)

## 2018-11-11 NOTE — Progress Notes (Signed)
PROGRESS NOTE  Christina Clark  ZOX:[id number] DOB: June 27, 1950 DOA: 10/10/2018 PCP: Patient, No Pcp Per   Brief Narrative: Christina Clark is a 68 y.o. female with PMHx of morbid obesity, HTN, depression-presenting with acute hypoxemic respiratory failure secondary to COVID-19 pneumonia.  She was treated with Remdesivir, Actemra and steroids-hospital course unfortunately has been prolonged and complicated by persistent hypoxemia.   Assessment & Plan: Principal Problem:   Acute respiratory failure with hypoxia (HCC) Active Problems:   Hypertension   Depression   Acute respiratory disease due to COVID-19 virus   Morbid obesity with BMI of 40.0-44.9, adult (HCC)   Diabetes mellitus type 2, uncontrolled (HCC)  Acute respiratory failure with hypoxia due to COVID-19 pneumonia: Overall improving, down to 10L supplemental oxygen.  - Completed remdesivir (6/11 - 6/15), given tocilizumab, continues on steroids, tapered to 40mg  IV daily x5 days, will start 20mg  dose today 7/12.  - CRP, d-dimer, and ferritin have normalized, will stop checking. - Wean oxygen as tolerates, still requiring significant support and will remain inpatient. 8LPM. - Avoid NSAIDs - Continue airborne/contact precautions.  - Mobilize as much as possible. - Prone as able. IS/FV.  LFT elevation: Very mild ALT elevation most likely due to viral infection, less likely drug effect. Has been stable. - Monitor intermittently.   Acute on chronic HFpEF: - Monitor I/O maintain even/negative balance.    Hypertension: Blood pressure stable.   - Continue home ARB and HCTZ.  Depression: Having a significant amount of anxiety while hospitalized.  - Continue scheduled low dose xanax started while inpatient. - Continue home zoloft  Morbid obesity: BMI 36.   T2DM, uncontrolled with hyperglycemia: HbA1c of 8.5%. Not on medications at home.  - CBGs coming under better control, continue current Tx. Anticipate improvement  with tapering steroids.  Leukocytosis: Suspect stress demargination and steroid effect, stable.  Hypokalemia: Mg wnl. - Supplement and monitor. Recheck in AM  DVT prophylaxis: Lovenox BID Code Status: Partial, DNI Family Communication: Daughter on phone throughout encounter. Disposition Plan: CIR planned once hypoxia improves. Note suggests that mobility may be good enough to discharge home by the time hypoxia improves enough.  Consultants:   PCCM  Procedures:   None  Antimicrobials:  Remdesivir   Subjective: No acute events noted. Patient without complaints, still short of breath with exertion but not at rest, remains on 8LPM. Daughter on phone throughout encounter for translation.   Objective: Vitals:   11/10/18 2025 11/11/18 0020 11/11/18 0330 11/11/18 0929  BP:    (!) 114/52  Pulse:    75  Resp:    (!) 24  Temp: 98.3 F (36.8 C) 97.7 F (36.5 C) 97.7 F (36.5 C) 97.9 F (36.6 C)  TempSrc: Oral Oral Oral Oral  SpO2:    94%  Weight:      Height:        Intake/Output Summary (Last 24 hours) at 11/11/2018 1109 Last data filed at 11/11/2018 0930 Gross per 24 hour  Intake 600 ml  Output 125 ml  Net 475 ml   Filed Weights   10/10/18 2300 11/03/18 0255  Weight: 108.7 kg 97.6 kg   Gen: 68 y.o. female in no distress Pulm: Nonlabored breathing 8L O2. Clear bilaterally. CV: Regular rate and rhythm. No murmur, rub, or gallop. No JVD, no dependent edema. GI: Abdomen soft, non-tender, non-distended, with normoactive bowel sounds.  Ext: Warm, no deformities Skin: No rashes, lesions or ulcers on visualized skin. Neuro: Alert and oriented. No focal neurological deficits.  Psych: Judgement and insight appear fair. Mood euthymic & affect congruent. Behavior is appropriate.    Data Reviewed: I have personally reviewed following labs and imaging studies  CBC: Recent Labs  Lab 11/05/18 0500 11/06/18 0602 11/07/18 0950 11/08/18 0503 11/09/18 0446  WBC 11.3* 10.3  9.3 10.3 11.4*  NEUTROABS 7.7 6.8 6.5 7.0 7.9*  HGB 12.8 12.1 12.1 11.5* 11.3*  HCT 39.6 38.1 38.0 37.5 35.7*  MCV 97.8 97.9 99.0 98.7 98.6  PLT 269 296 306 328 161   Basic Metabolic Panel: Recent Labs  Lab 11/05/18 0500 11/06/18 0602 11/07/18 0950 11/08/18 0503 11/09/18 0446  NA 138 138 138 138 139  K 3.5 3.3* 3.1* 3.2* 4.3  CL 99 100 100 100 105  CO2 30 28 25 28 27   GLUCOSE 77 66* 177* 70 77  BUN 34* 31* 31* 31* 30*  CREATININE 0.77 0.76 0.79 0.71 0.70  CALCIUM 8.7* 8.7* 8.6* 8.5* 8.5*  MG 2.6* 2.4 2.3 2.4  --    GFR: Estimated Creatinine Clearance: 78.5 mL/min (by C-G formula based on SCr of 0.7 mg/dL). Liver Function Tests: Recent Labs  Lab 11/05/18 0500 11/06/18 0602 11/07/18 0950 11/08/18 0503 11/09/18 0446  AST 17 17 19 18 17   ALT 41 43 45* 46* 49*  ALKPHOS 43 40 40 37* 35*  BILITOT 0.5 0.4 0.4 0.2* 0.3  PROT 6.4* 6.1* 5.9* 5.9* 5.8*  ALBUMIN 2.7* 2.7* 2.7* 2.7* 2.6*   No results for input(s): LIPASE, AMYLASE in the last 168 hours. No results for input(s): AMMONIA in the last 168 hours. Coagulation Profile: No results for input(s): INR, PROTIME in the last 168 hours. Cardiac Enzymes: No results for input(s): CKTOTAL, CKMB, CKMBINDEX, TROPONINI in the last 168 hours. BNP (last 3 results) No results for input(s): PROBNP in the last 8760 hours. HbA1C: No results for input(s): HGBA1C in the last 72 hours. CBG: Recent Labs  Lab 11/10/18 0758 11/10/18 1124 11/10/18 1642 11/10/18 2103 11/11/18 0841  GLUCAP 96 186* 257* 168* 76   Lipid Profile: No results for input(s): CHOL, HDL, LDLCALC, TRIG, CHOLHDL, LDLDIRECT in the last 72 hours. Thyroid Function Tests: No results for input(s): TSH, T4TOTAL, FREET4, T3FREE, THYROIDAB in the last 72 hours. Anemia Panel: No results for input(s): VITAMINB12, FOLATE, FERRITIN, TIBC, IRON, RETICCTPCT in the last 72 hours. Urine analysis:    Component Value Date/Time   COLORURINE YELLOW 10/24/2018 0721    APPEARANCEUR CLEAR 10/24/2018 0721   LABSPEC 1.025 10/24/2018 0721   PHURINE 5.0 10/24/2018 0721   GLUCOSEU NEGATIVE 10/24/2018 0721   HGBUR NEGATIVE 10/24/2018 0721   BILIRUBINUR NEGATIVE 10/24/2018 0721   KETONESUR NEGATIVE 10/24/2018 0721   PROTEINUR NEGATIVE 10/24/2018 0721   NITRITE NEGATIVE 10/24/2018 0721   LEUKOCYTESUR NEGATIVE 10/24/2018 0721   No results found for this or any previous visit (from the past 240 hour(s)).    Radiology Studies: No results found.  Scheduled Meds: . sodium chloride   Intravenous Once  . clonazepam  0.5 mg Oral BID  . enoxaparin (LOVENOX) injection  0.5 mg/kg Subcutaneous Q12H  . insulin aspart  0-20 Units Subcutaneous TID WC  . insulin aspart  5 Units Subcutaneous TID WC  . insulin detemir  10 Units Subcutaneous QHS  . methylPREDNISolone (SOLU-MEDROL) injection  20 mg Intravenous Daily  . pantoprazole  40 mg Oral Daily  . Ensure Max Protein  11 oz Oral BID  . sertraline  50 mg Oral Daily  . sodium chloride flush  10-40 mL Intracatheter Q12H  Continuous Infusions:   LOS: 32 days   Time spent: 25 minutes.  Tyrone Nineyan B Nadelyn Enriques, MD [facility] [email or web address] Password [email or web address] 11/11/2018, 11:09 AM

## 2018-11-11 NOTE — Progress Notes (Signed)
Telemetery reported 18 beat run of SVT, with heart rate as high as 158bpm. Pt sitting in chair, talking on the telephone with her daughter, says, "I feel fine."  Dr. Bonner Puna paged and notified.     11/11/18 1335  Vitals  Temp 98.2 F (36.8 C)  Temp Source Oral  BP (!) 143/73  MAP (mmHg) 91  BP Location Left Arm  BP Method Automatic  Patient Position (if appropriate) Sitting  Pulse Rate (!) 104  Pulse Rate Source Monitor  ECG Heart Rate 100  Resp (!) 26  Oxygen Therapy  SpO2 92 %  O2 Device HFNC  O2 Flow Rate (L/min) 7 L/min  Patient Activity (if Appropriate) In chair  Pulse Oximetry Type Continuous  Pain Assessment  Pain Scale 0-10  Pain Score 0  MEWS Score  MEWS RR 2  MEWS Pulse 0  MEWS Systolic 0  MEWS LOC 0  MEWS Temp 0  MEWS Score 2  MEWS Score Color Yellow

## 2018-11-12 LAB — CBC WITH DIFFERENTIAL/PLATELET
Abs Immature Granulocytes: 0.16 10*3/uL — ABNORMAL HIGH (ref 0.00–0.07)
Basophils Absolute: 0.1 10*3/uL (ref 0.0–0.1)
Basophils Relative: 0 %
Eosinophils Absolute: 0.3 10*3/uL (ref 0.0–0.5)
Eosinophils Relative: 3 %
HCT: 35 % — ABNORMAL LOW (ref 36.0–46.0)
Hemoglobin: 11.1 g/dL — ABNORMAL LOW (ref 12.0–15.0)
Immature Granulocytes: 1 %
Lymphocytes Relative: 20 %
Lymphs Abs: 2.3 10*3/uL (ref 0.7–4.0)
MCH: 31.5 pg (ref 26.0–34.0)
MCHC: 31.7 g/dL (ref 30.0–36.0)
MCV: 99.4 fL (ref 80.0–100.0)
Monocytes Absolute: 1 10*3/uL (ref 0.1–1.0)
Monocytes Relative: 9 %
Neutro Abs: 7.7 10*3/uL (ref 1.7–7.7)
Neutrophils Relative %: 67 %
Platelets: 324 10*3/uL (ref 150–400)
RBC: 3.52 MIL/uL — ABNORMAL LOW (ref 3.87–5.11)
RDW: 17 % — ABNORMAL HIGH (ref 11.5–15.5)
WBC: 11.5 10*3/uL — ABNORMAL HIGH (ref 4.0–10.5)
nRBC: 0 % (ref 0.0–0.2)

## 2018-11-12 LAB — COMPREHENSIVE METABOLIC PANEL
ALT: 49 U/L — ABNORMAL HIGH (ref 0–44)
AST: 16 U/L (ref 15–41)
Albumin: 2.7 g/dL — ABNORMAL LOW (ref 3.5–5.0)
Alkaline Phosphatase: 28 U/L — ABNORMAL LOW (ref 38–126)
Anion gap: 10 (ref 5–15)
BUN: 26 mg/dL — ABNORMAL HIGH (ref 8–23)
CO2: 26 mmol/L (ref 22–32)
Calcium: 8.4 mg/dL — ABNORMAL LOW (ref 8.9–10.3)
Chloride: 105 mmol/L (ref 98–111)
Creatinine, Ser: 0.78 mg/dL (ref 0.44–1.00)
GFR calc Af Amer: >60 mL/min (ref >60)
GFR calc non Af Amer: >60 mL/min (ref >60)
Glucose, Bld: 81 mg/dL (ref 70–99)
Potassium: 3.9 mmol/L (ref 3.5–5.1)
Sodium: 141 mmol/L (ref 135–145)
Total Bilirubin: 0.2 mg/dL — ABNORMAL LOW (ref 0.3–1.2)
Total Protein: 5.6 g/dL — ABNORMAL LOW (ref 6.5–8.1)

## 2018-11-12 LAB — GLUCOSE, CAPILLARY
Glucose-Capillary: 79 mg/dL (ref 70–99)
Glucose-Capillary: 238 mg/dL — ABNORMAL HIGH (ref 70–99)
Glucose-Capillary: 237 mg/dL — ABNORMAL HIGH (ref 70–99)

## 2018-11-12 NOTE — Progress Notes (Signed)
   11/12/18 0900  PT Visit Information  Last PT Received On 11/12/18  Assistance Needed +1  Subjective Data  Subjective I need to get up  Precautions  Precautions Fall  Precaution Comments HFNC 10L  Pain Assessment  Pain Assessment No/denies pain  Cognition  Arousal/Alertness Awake/alert  Behavior During Therapy [name] for tasks assessed/performed  Bed Mobility  Overal bed mobility Needs Assistance  Bed Mobility Supine to Sit  Rolling Supervision  General bed mobility comments  (no assistance)  Transfers  Overall transfer level Needs assistance  Equipment used None  Transfers Sit to/from Stand;Stand Pivot Transfers  Sit to Stand Min guard  Stand pivot transfers Min guard  General transfer comment moves quickly for transfers due to dyspnea; Able to take several steps to [name]  Balance  Overall balance assessment Needs assistance  Sitting-balance support No upper extremity supported;Feet supported  PT - End of Session  Equipment Utilized During Treatment Oxygen  Activity Tolerance Patient tolerated treatment well  Patient left in chair;with call bell/phone within reach  Nurse Communication Mobility status   PT - Assessment/Plan  PT Plan Current plan remains appropriate  PT Visit Diagnosis Difficulty in walking, not elsewhere classified (R26.2)  PT Frequency (ACUTE ONLY) Min 3X/week  Recommendations for Other Services Rehab consult  Follow Up Recommendations CIR  PT equipment Rolling walker with 5" wheels  AM-PAC PT "6 Clicks" Mobility Outcome Measure (Version 2)  Help needed turning from your back to your side while in a flat bed without using bedrails? 4  Help needed moving from lying on your back to sitting on the side of a flat bed without using bedrails? 4  Help needed moving to and from a bed to a chair (including a wheelchair)? 3  Help needed standing up from a chair using your arms (e.g., wheelchair or bedside chair)? 2  Help needed to walk in hospital room? 2  Help  needed climbing 3-5 steps with a railing?  1  6 Click Score 16  Consider Recommendation of Discharge To: Home with [name]  PT Goal Progression  Progress towards PT goals Progressing toward goals  PT Time Calculation  PT Start Time (ACUTE ONLY) 0830  PT Stop Time (ACUTE ONLY) 0853  PT Time Calculation (min) (ACUTE ONLY) 23 min  PT General Charges  $$ ACUTE PT VISIT 1 Visit  PT Treatments  $Therapeutic Activity 23-37 mins  Tresa Endo PT Acute Rehabilitation Services Pager [phone] Office [phone]

## 2018-11-12 NOTE — Progress Notes (Signed)
PROGRESS NOTE  Christina Clark  XLK:[id number] DOB: 08/23/1950 DOA: 10/10/2018 PCP: Patient, No Pcp Per   Brief Narrative: Christina Clark is a 68 y.o. female with PMHx of morbid obesity, HTN, depression-presenting with acute hypoxemic respiratory failure secondary to COVID-19 pneumonia.  She was treated with Remdesivir, Actemra and steroids-hospital course unfortunately has been prolonged and complicated by persistent hypoxemia.   Assessment & Plan: Principal Problem:   Acute respiratory failure with hypoxia (HCC) Active Problems:   Hypertension   Depression   Acute respiratory disease due to COVID-19 virus   Morbid obesity with BMI of 40.0-44.9, adult (HCC)   Diabetes mellitus type 2, uncontrolled (HCC)  Acute respiratory failure with hypoxia due to COVID-19 pneumonia: Overall improving though remains dependent on high level oxygen supplementation. - Completed remdesivir (6/11 - 6/15), given tocilizumab, continues on steroids, tapered to 40mg  IV daily x5 days > 20mg  dose 7/12.  - CRP, d-dimer, and ferritin have normalized, will stop checking. - Wean oxygen as tolerates, still requiring significant support and will remain inpatient. 8LPM. - Avoid NSAIDs - Continue airborne/contact precautions.  - Mobilize as much as possible. PT/OT to continue. - Prone as able. IS/FV.  PSVT: Noted on telemetry 7/12, asymptomatic with stable vital signs.  - Continuing cardiac monitoring.  - Optimize electrolytes.  LFT elevation: Very mild ALT elevation most likely due to viral infection, less likely drug effect. Has been stable. - Monitor intermittently.   Acute on chronic HFpEF: - Monitor I/O maintain even/negative balance.    Hypertension: Blood pressure stable.   - Continue home ARB and HCTZ.  Depression: Having a significant amount of anxiety while hospitalized.  - Continue scheduled low dose xanax started while inpatient. - Continue home zoloft  Morbid obesity: BMI 36.    T2DM, uncontrolled with hyperglycemia: HbA1c of 8.5%. Not on medications at home.  - CBGs coming under better control, continue current Tx. Anticipate improvement with tapering steroids. No hypoglycemia, but will continue monitoring for need to decrease basal insulin.  Leukocytosis: Suspect stress demargination and steroid effect, stable.  Hypokalemia: Mg wnl. - Supplement and monitor. Recheck in AM  DVT prophylaxis: Lovenox BID Code Status: Partial, DNI Family Communication: Granddaughter on phone throughout encounter. Disposition Plan: CIR planned once hypoxia improves. Note suggests that mobility may be good enough to discharge home by the time hypoxia improves enough.  Consultants:   PCCM  Procedures:   None  Antimicrobials:  Remdesivir   Subjective: Feels "bien!" and unchanged from yesterday. Denies chest pain, abd pain, leg swelling. Sleeping ok. Eager to move forward. Hydrangeas brought to her by PT sitting in window sill. She has no new complaints. As always, family on speaker phone used as interpretor per pt preference.   Objective: Vitals:   11/11/18 2100 11/12/18 0300 11/12/18 0401 11/12/18 0826  BP: (!) 90/59 107/67 (!) 107/42 111/62  Pulse: 83 68 65 93  Resp: (!) 22 (!) 23 (!) 21 (!) 21  Temp: 98.4 F (36.9 C) 99.1 F (37.3 C) 98.7 F (37.1 C) 98.4 F (36.9 C)  TempSrc: Oral Oral Oral Oral  SpO2: 95% 94% (!) 89% (!) 84%  Weight:      Height:        Intake/Output Summary (Last 24 hours) at 11/12/2018 1141 Last data filed at 11/11/2018 1730 Gross per 24 hour  Intake 240 ml  Output 125 ml  Net 115 ml   Filed Weights   10/10/18 2300 11/03/18 0255  Weight: 108.7 kg 97.6 kg  Gen: 68 y.o. female in no distress Pulm: Nonlabored but tachypneic, now back up to 10L HFNC. Clear bilaterally. CV: Regular rate and rhythm. No murmur, rub, or gallop. No JVD, no dependent edema. GI: Abdomen soft, non-tender, non-distended, with normoactive bowel sounds.  Ext:  Warm, no deformities Skin: No rashes, lesions or ulcers on visualized skin. Neuro: Alert and oriented. No focal neurological deficits. Psych: Judgement and insight appear fair. Mood euthymic & affect congruent. Behavior is appropriate.    Data Reviewed: I have personally reviewed following labs and imaging studies  CBC: Recent Labs  Lab 11/06/18 0602 11/07/18 0950 11/08/18 0503 11/09/18 0446 11/12/18 0545  WBC 10.3 9.3 10.3 11.4* 11.5*  NEUTROABS 6.8 6.5 7.0 7.9* 7.7  HGB 12.1 12.1 11.5* 11.3* 11.1*  HCT 38.1 38.0 37.5 35.7* 35.0*  MCV 97.9 99.0 98.7 98.6 99.4  PLT 296 306 328 346 062   Basic Metabolic Panel: Recent Labs  Lab 11/06/18 0602 11/07/18 0950 11/08/18 0503 11/09/18 0446 11/12/18 0545  NA 138 138 138 139 141  K 3.3* 3.1* 3.2* 4.3 3.9  CL 100 100 100 105 105  CO2 28 25 28 27 26   GLUCOSE 66* 177* 70 77 81  BUN 31* 31* 31* 30* 26*  CREATININE 0.76 0.79 0.71 0.70 0.78  CALCIUM 8.7* 8.6* 8.5* 8.5* 8.4*  MG 2.4 2.3 2.4  --   --    GFR: Estimated Creatinine Clearance: 78.5 mL/min (by C-G formula based on SCr of 0.78 mg/dL). Liver Function Tests: Recent Labs  Lab 11/06/18 0602 11/07/18 0950 11/08/18 0503 11/09/18 0446 11/12/18 0545  AST 17 19 18 17 16   ALT 43 45* 46* 49* 49*  ALKPHOS 40 40 37* 35* 28*  BILITOT 0.4 0.4 0.2* 0.3 0.2*  PROT 6.1* 5.9* 5.9* 5.8* 5.6*  ALBUMIN 2.7* 2.7* 2.7* 2.6* 2.7*   No results for input(s): LIPASE, AMYLASE in the last 168 hours. No results for input(s): AMMONIA in the last 168 hours. Coagulation Profile: No results for input(s): INR, PROTIME in the last 168 hours. Cardiac Enzymes: No results for input(s): CKTOTAL, CKMB, CKMBINDEX, TROPONINI in the last 168 hours. BNP (last 3 results) No results for input(s): PROBNP in the last 8760 hours. HbA1C: No results for input(s): HGBA1C in the last 72 hours. CBG: Recent Labs  Lab 11/11/18 0841 11/11/18 1319 11/11/18 1645 11/11/18 2133 11/12/18 0823  GLUCAP 76 209* 217*  118* 79   Lipid Profile: No results for input(s): CHOL, HDL, LDLCALC, TRIG, CHOLHDL, LDLDIRECT in the last 72 hours. Thyroid Function Tests: No results for input(s): TSH, T4TOTAL, FREET4, T3FREE, THYROIDAB in the last 72 hours. Anemia Panel: No results for input(s): VITAMINB12, FOLATE, FERRITIN, TIBC, IRON, RETICCTPCT in the last 72 hours. Urine analysis:    Component Value Date/Time   COLORURINE YELLOW 10/24/2018 0721   APPEARANCEUR CLEAR 10/24/2018 0721   LABSPEC 1.025 10/24/2018 0721   PHURINE 5.0 10/24/2018 0721   GLUCOSEU NEGATIVE 10/24/2018 0721   HGBUR NEGATIVE 10/24/2018 0721   BILIRUBINUR NEGATIVE 10/24/2018 0721   KETONESUR NEGATIVE 10/24/2018 0721   PROTEINUR NEGATIVE 10/24/2018 0721   NITRITE NEGATIVE 10/24/2018 0721   LEUKOCYTESUR NEGATIVE 10/24/2018 0721   No results found for this or any previous visit (from the past 240 hour(s)).    Radiology Studies: No results found.  Scheduled Meds: . sodium chloride   Intravenous Once  . clonazepam  0.5 mg Oral BID  . enoxaparin (LOVENOX) injection  0.5 mg/kg Subcutaneous Q12H  . insulin aspart  0-20 Units Subcutaneous  TID WC  . insulin aspart  5 Units Subcutaneous TID WC  . insulin detemir  10 Units Subcutaneous QHS  . methylPREDNISolone (SOLU-MEDROL) injection  20 mg Intravenous Daily  . pantoprazole  40 mg Oral Daily  . Ensure Max Protein  11 oz Oral BID  . sertraline  50 mg Oral Daily  . sodium chloride flush  10-40 mL Intracatheter Q12H   Continuous Infusions:   LOS: 33 days   Time spent: 25 minutes.  Tyrone Nineyan B Spenser Harren, MD [facility] [email or web address] Password [email or web address] 11/12/2018, 11:41 AM

## 2018-11-12 NOTE — Progress Notes (Signed)
Occupational Therapy Treatment Patient Details Name: Christina Clark MRN: [id number] DOB: Sep 10, 1950 Today's Date: 11/12/2018    History of present illness 68 year old Spanish-speaking female with past medical history of morbid obesity, hypertension and depression  presenting to the emergency room 10/10/18 with 1 week of fever, cough and shortness of breath in the setting of known COVID-19 contacts.  Patient found to be COVID positive as well as hypoxic with bilateral infiltrates on chest x-ray.  Requiring oxygen support of high flow   OT comments  Pt eager to work with therapy today. She demonstrated all the theraband exercises that she has been doing when therapy is not in the room with her level 1 theraband. She was then able to perform toilet transfer and peri care at min guard level. She continues to do this rapidly due to DOE (3/4). She was then very excited to walk in room on 10L HFNC using RW at min guard level. Her O2 levels decreased from 91-78 but quickly rebounds in approx 4 min with cues for slower, deeper breathing. She continues to be highly motivated, and an excellent candidate for CIR. OT will continue to follow acutely.    Follow Up Recommendations  CIR;Supervision/Assistance - 24 hour    Equipment Recommendations  3 in 1 bedside commode    Recommendations for Other Services Rehab consult    Precautions / Restrictions Precautions Precautions: Fall Precaution Comments: HFNC 10L Restrictions Weight Bearing Restrictions: No       Mobility Bed Mobility               General bed mobility comments: OOB in recliner  Transfers Overall transfer level: Needs assistance Equipment used: Rolling walker (2 wheeled) Transfers: Sit to/from [employer] CorporationStand;Stand Pivot Transfers Sit to Stand: Min guard Stand pivot transfers: Min guard       General transfer comment: moves quickly for transfers due to dyspnea; Able to take several steps to [facility]    Balance Overall balance  assessment: Needs assistance Sitting-balance support: No upper extremity supported;Feet supported Sitting balance-Leahy Scale: Good     Standing balance support: Bilateral upper extremity supported;During functional activity Standing balance-Leahy Scale: Fair Standing balance comment: reliant on support for dynamic movement                           ADL either performed or assessed with clinical judgement   ADL Overall ADL's : Needs assistance/impaired     Grooming: Wash/dry hands;Wash/dry face;Set up;Sitting   Upper Body Bathing: Minimal assistance;Sitting Upper Body Bathing Details (indicate cue type and reason): washed Patient's back and pt LOVED it             Toilet Transfer: Min [employer] copyguard;BSC;Stand-pivot Toilet Transfer Details (indicate cue type and reason): min guard for safety, Pt able to power up utilizing hand rests on recliner, good line/safety awareness Toileting- Clothing Manipulation and Hygiene: Min guard;Sit to/from stand Toileting - Clothing Manipulation Details (indicate cue type and reason): [facility], good hand placement       General ADL Comments: Motivated to complete self care. Transfers quickly due to dyspnea     Vision       Perception     Praxis      Cognition Arousal/Alertness: Awake/alert Behavior During Therapy: WFL for tasks assessed/performed Overall Cognitive Status: Within Functional Limits for tasks assessed  General Comments: Motivated to participate in therapy. Granddaughter Eyvonne Mechanic) on phone, patient much less anxious about mobilizing (although still enjoys eating a peppermint prior to transfers/mobility) and does not need as much time to recover        Exercises Exercises: General Upper Extremity General Exercises - Upper Extremity Shoulder Flexion: Strengthening;Both;Theraband;5 reps Theraband Level (Shoulder Flexion): Level 1 (Yellow) Shoulder Extension:  AROM;Strengthening;Both;Seated;5 reps Shoulder ABduction: Strengthening;Seated;Theraband;5 reps Theraband Level (Shoulder Abduction): Level 1 (Yellow) Shoulder Horizontal ABduction: Strengthening;Both;Seated;5 reps Shoulder Horizontal ADduction: Strengthening;Both;Seated;5 reps Other Exercises Other Exercises: Pt completing theraband exercises on her own   Shoulder Instructions       General Comments continues to be VERY motivated and eager to work with therapy    Pertinent Vitals/ Pain       Pain Assessment: No/denies pain Pain Intervention(s): Monitored during session  Home Living                                          Prior Functioning/Environment              Frequency  Min 3X/week        Progress Toward Goals  OT Goals(current goals can now be found in the care plan section)  Progress towards OT goals: Progressing toward goals  Acute Rehab OT Goals Patient Stated Goal: "get strong enough to go home" OT Goal Formulation: With patient/family Time For Goal Achievement: 11/19/18 Potential to Achieve Goals: Good  Plan Discharge plan remains appropriate    Co-evaluation    PT/OT/SLP Co-Evaluation/Treatment: Yes Reason for Co-Treatment: Complexity of the patient's impairments (multi-system involvement);For patient/therapist safety;To address functional/ADL transfers PT goals addressed during session: Mobility/safety with mobility;Proper use of DME;Strengthening/ROM OT goals addressed during session: ADL's and self-care;Proper use of Adaptive equipment and DME      AM-PAC OT "6 Clicks" Daily Activity     Outcome Measure   Help from another person eating meals?: None Help from another person taking care of personal grooming?: A Little Help from another person toileting, which includes using toliet, bedpan, or urinal?: A Little Help from another person bathing (including washing, rinsing, drying)?: A Lot Help from another person to put on  and taking off regular upper body clothing?: A Little Help from another person to put on and taking off regular lower body clothing?: A Lot 6 Click Score: 17    End of Session Equipment Utilized During Treatment: Oxygen;Rolling walker;Gait belt(10L via HFNC)  OT Visit Diagnosis: Unsteadiness on feet (R26.81);Muscle weakness (generalized) (M62.81)   Activity Tolerance Patient tolerated treatment well   Patient Left in chair;with call bell/phone within reach   Nurse Communication Mobility status        Time: 1610-9604 OT Time Calculation (min): 32 min  Charges: OT General Charges $OT Visit: 1 Visit OT Treatments $Self Care/Home Management : 8-22 mins  Hulda Humphrey OTR/L Acute Rehabilitation Services Pager: [phone] Office: [place] 11/12/2018, 5:17 PM

## 2018-11-12 NOTE — Progress Notes (Signed)
Physical Therapy Treatment Patient Details Name: Christina Clark MRN: [id number] DOB: [birth year] Today's Date: 11/12/2018    History of Present Illness 68 year old Spanish-speaking female with past medical history of morbid obesity, hypertension and depression  presenting to the emergency room 10/10/18 with 1 week of fever, cough and shortness of breath in the setting of known COVID-19 contacts.  Patient found to be COVID positive as well as hypoxic with bilateral infiltrates on chest x-ray.  Requiring oxygen support of high flow    PT Comments    The patient is making progress in mobility, her recovery time after ambulating is improving with shorter time. Today, on 10 L HFNC, ambulated 14' x 2 with about 5 min break. Lowest Sats on forehead using finger, (not [employer] as it was not available), noted at 69% RR 40 HR in 90's patient recovered to 85% within 4 minutes after each burst of ambulation. Patient appears much calmer, self monitors breathing. Patient is very motivated to progress on. Will attempt increasing distance of ambulation a little at a time or increase the number of times and keep distances shorter. Continue  Mobility. Granddaughter sylvia on phone to interpret.  Follow Up Recommendations  CIR     Equipment Recommendations  Rolling walker with 5" wheels    Recommendations for Other Services       Precautions / Restrictions Precautions Precautions: Fall Precaution Comments: HFNC 10L Restrictions Weight Bearing Restrictions: No    Mobility  Bed Mobility               General bed mobility comments: OOB in recliner  Transfers Overall transfer level: Needs assistance Equipment used: Rolling walker (2 wheeled) Transfers: Sit to/from [employer] CorporationStand;Stand Pivot Transfers Sit to Stand: Min guard Stand pivot transfers: Min guard       General transfer comment: moves quickly for transfers due to dyspnea; Able to take several steps to  [facility]  Ambulation/Gait Ambulation/Gait assistance: Min assist;+2 safety/equipment Gait Distance (Feet): 14 Feet(x 2) Assistive device: Rolling walker (2 wheeled) Gait Pattern/deviations: Step-through pattern Gait velocity: decr   General Gait Details: patient on 10 L HFNC, moved qyickly, encouraged slow breathing, Sao2 dropping to 69% RR 40 but  recovered to 85% within ~ 3 minutes.   Stairs             Wheelchair Mobility    Modified Rankin (Stroke Patients Only)       Balance Overall balance assessment: Needs assistance Sitting-balance support: No upper extremity supported;Feet supported Sitting balance-Leahy Scale: Good     Standing balance support: Bilateral upper extremity supported;During functional activity Standing balance-Leahy Scale: Fair Standing balance comment: reliant on support for dynamic movement                            Cognition Arousal/Alertness: Awake/alert Behavior During Therapy: WFL for tasks assessed/performed Overall Cognitive Status: Within Functional Limits for tasks assessed                                 General Comments: Motivated to participate in therapy. [name] on phone, patient much less anxious about mobilizing (although still enjoys eating a peppermint prior to transfers/mobility) and does not need as much time to recover      Exercises General Exercises - Upper Extremity Shoulder Flexion: Strengthening;Both;Theraband;5 reps Theraband Level (Shoulder Flexion): Level 1 (Yellow) Shoulder Extension: AROM;Strengthening;Both;Seated;5 reps Shoulder ABduction: Strengthening;Seated;Theraband;5 reps  Theraband Level (Shoulder Abduction): Level 1 (Yellow) Shoulder Horizontal ABduction: Strengthening;Both;Seated;5 reps Shoulder Horizontal ADduction: Strengthening;Both;Seated;5 reps Other Exercises Other Exercises: Pt completing theraband exercises on her own    General Comments General  comments (skin integrity, edema, etc.): continues to be VERY motivated and eager to work with therapy      Pertinent Vitals/Pain Pain Assessment: No/denies pain Pain Intervention(s): Monitored during session    Home Living                      Prior Function            PT Goals (current goals can now be found in the care plan section) Acute Rehab PT Goals Patient Stated Goal: "get strong enough to go home" Progress towards PT goals: Progressing toward goals    Frequency    Min 3X/week      PT Plan Current plan remains appropriate    Co-evaluation PT/OT/SLP Co-Evaluation/Treatment: Yes Reason for Co-Treatment: For patient/therapist safety;Complexity of the patient's impairments (multi-system involvement) PT goals addressed during session: Mobility/safety with mobility OT goals addressed during session: ADL's and self-care      AM-PAC PT "6 Clicks" Mobility   Outcome Measure  Help needed turning from your back to your side while in a flat bed without using bedrails?: A Little Help needed moving from lying on your back to sitting on the side of a flat bed without using bedrails?: A Little Help needed moving to and from a bed to a chair (including a wheelchair)?: A Little Help needed standing up from a chair using your arms (e.g., wheelchair or bedside chair)?: A Little Help needed to walk in hospital room?: A Lot Help needed climbing 3-5 steps with a railing? : Total 6 Click Score: 15    End of Session Equipment Utilized During Treatment: Gait belt;Oxygen Activity Tolerance: Patient tolerated treatment well Patient left: in chair;with call bell/phone within reach Nurse Communication: Mobility status PT Visit Diagnosis: Difficulty in walking, not elsewhere classified (R26.2)     Time: 9604-5409 PT Time Calculation (min) (ACUTE ONLY): 37 min  Charges:  $Gait Training: 8-22 mins                     [place]  Office  [phone]    [name] 11/12/2018, 5:33 PM

## 2018-11-12 NOTE — Progress Notes (Signed)
Patient ambulated in hall twice around half unit. O2 sats 88-90%.

## 2018-11-13 LAB — GLUCOSE, CAPILLARY
Glucose-Capillary: 244 mg/dL — ABNORMAL HIGH (ref 70–99)
Glucose-Capillary: 76 mg/dL (ref 70–99)
Glucose-Capillary: 203 mg/dL — ABNORMAL HIGH (ref 70–99)
Glucose-Capillary: 246 mg/dL — ABNORMAL HIGH (ref 70–99)
Glucose-Capillary: 126 mg/dL — ABNORMAL HIGH (ref 70–99)

## 2018-11-13 MED ORDER — ENOXAPARIN SODIUM 60 MG/0.6ML ~~LOC~~ SOLN
50.0000 mg | SUBCUTANEOUS | Status: DC
  Administered 2018-11-14 – 2018-11-18 (×5): 50 mg via SUBCUTANEOUS
  Filled 2018-11-13 (×5): qty 0.6

## 2018-11-13 NOTE — Progress Notes (Signed)
PROGRESS NOTE  Christina Clark  GMW:[id number] DOB: 09-Jul-1950 DOA: 10/10/2018 PCP: Patient, No Pcp Per   Brief Narrative: Christina Clark is a 68 y.o. female with PMHx of morbid obesity, HTN, depression-presenting with acute hypoxemic respiratory failure secondary to COVID-19 pneumonia.  She was treated with Remdesivir, Actemra and steroids-hospital course unfortunately has been prolonged and complicated by persistent hypoxemia.   Assessment & Plan: Principal Problem:   Acute respiratory failure with hypoxia (HCC) Active Problems:   Hypertension   Depression   Acute respiratory disease due to COVID-19 virus   Morbid obesity with BMI of 40.0-44.9, adult (HCC)   Diabetes mellitus type 2, uncontrolled ([place])  Acute respiratory failure with hypoxia due to COVID-19 pneumonia: Overall improving though remains dependent on high level oxygen supplementation. - Completed remdesivir (6/11 - 6/15), given tocilizumab, continues on steroids, tapering 40mg  IV daily x5 days > 20mg  dose 7/12.  - CRP, d-dimer, and ferritin normalized, will check intermittently. - Wean oxygen as tolerates, still requiring significant support and will remain inpatient. 8LPM. - Avoid NSAIDs - Continue airborne/contact precautions.  - Mobilize as much as possible. PT/OT to continue. - Prone as able. IS/FV.  PSVT: Noted on telemetry 7/12, asymptomatic with stable vital signs.  - Continuing cardiac monitoring.  - Optimize electrolytes.  LFT elevation: Very mild ALT elevation most likely due to viral infection, less likely drug effect. Has been stable. - Monitor intermittently.   Acute on chronic HFpEF: - Monitor I/O maintain even/negative balance.    Hypertension: Blood pressure stable.   - Continue home ARB and HCTZ.  Depression: Having a significant amount of anxiety while hospitalized.  - Continue scheduled low dose xanax started while inpatient. - Continue home zoloft  Morbid obesity: BMI 36.    T2DM, uncontrolled with hyperglycemia: HbA1c of 8.5%. Not on medications at home.  - CBGs coming under better control, continue current Tx. Anticipate improvement with tapering steroids. No hypoglycemia, but will continue monitoring for need to decrease basal insulin.  Leukocytosis: Suspect stress demargination and steroid effect, stable.  Hypokalemia: Mg wnl. - Supplement and monitor. Recheck in AM  DVT prophylaxis: Lovenox q24h Code Status: Partial, DNI Family Communication: Granddaughter on phone throughout encounter again today Disposition Plan: CIR planned once hypoxia improves. Note suggests that mobility may be good enough to discharge home by the time hypoxia improves enough.  Consultants:   PCCM  Procedures:   None  Antimicrobials:  Remdesivir   Subjective: No new complaints, eager to continue working toward discharge. Walked with PT x2 yesterday. No chest pain or dyspnea at rest.   Objective: Vitals:   11/13/18 0818 11/13/18 0822 11/13/18 1239 11/13/18 1454  BP: (!) 129/59  (!) 120/52   Pulse: 84  94   Resp: (!) 26  (!) 27   Temp: 98.1 F (36.7 C)  98.2 F (36.8 C)   TempSrc: Oral  Oral   SpO2: 93% (!) 82% 95% (!) 81%  Weight:      Height:        Intake/Output Summary (Last 24 hours) at 11/13/2018 1631 Last data filed at 11/13/2018 1215 Gross per 24 hour  Intake 610 ml  Output 2 ml  Net 608 ml   Filed Weights   10/10/18 2300 11/03/18 0255  Weight: 108.7 kg 97.6 kg   Gen: 68 y.o. female in no distress Pulm: Nonlabored breathing 8L. Clear. CV: Regular rate and rhythm. No murmur, rub, or gallop. No JVD, no dependent edema. GI: Abdomen soft, non-tender, non-distended, with  normoactive bowel sounds.  Ext: Warm, no deformities Skin: No rashes, lesions or ulcers on visualized skin. Neuro: Alert and oriented. No focal neurological deficits. Psych: Judgement and insight appear fair. Mood euthymic & affect congruent. Behavior is appropriate.    Data  Reviewed: I have personally reviewed following labs and imaging studies  CBC: Recent Labs  Lab 11/07/18 0950 11/08/18 0503 11/09/18 0446 11/12/18 0545  WBC 9.3 10.3 11.4* 11.5*  NEUTROABS 6.5 7.0 7.9* 7.7  HGB 12.1 11.5* 11.3* 11.1*  HCT 38.0 37.5 35.7* 35.0*  MCV 99.0 98.7 98.6 99.4  PLT 306 328 346 324   Basic Metabolic Panel: Recent Labs  Lab 11/07/18 0950 11/08/18 0503 11/09/18 0446 11/12/18 0545  NA 138 138 139 141  K 3.1* 3.2* 4.3 3.9  CL 100 100 105 105  CO2 25 28 27 26   GLUCOSE 177* 70 77 81  BUN 31* 31* 30* 26*  CREATININE 0.79 0.71 0.70 0.78  CALCIUM 8.6* 8.5* 8.5* 8.4*  MG 2.3 2.4  --   --    GFR: Estimated Creatinine Clearance: 78.5 mL/min (by C-G formula based on SCr of 0.78 mg/dL). Liver Function Tests: Recent Labs  Lab 11/07/18 0950 11/08/18 0503 11/09/18 0446 11/12/18 0545  AST 19 18 17 16   ALT 45* 46* 49* 49*  ALKPHOS 40 37* 35* 28*  BILITOT 0.4 0.2* 0.3 0.2*  PROT 5.9* 5.9* 5.8* 5.6*  ALBUMIN 2.7* 2.7* 2.6* 2.7*   No results for input(s): LIPASE, AMYLASE in the last 168 hours. No results for input(s): AMMONIA in the last 168 hours. Coagulation Profile: No results for input(s): INR, PROTIME in the last 168 hours. Cardiac Enzymes: No results for input(s): CKTOTAL, CKMB, CKMBINDEX, TROPONINI in the last 168 hours. BNP (last 3 results) No results for input(s): PROBNP in the last 8760 hours. HbA1C: No results for input(s): HGBA1C in the last 72 hours. CBG: Recent Labs  Lab 11/12/18 1222 11/12/18 1658 11/12/18 2043 11/13/18 0622 11/13/18 1206  GLUCAP 238* 244* 237* 76 203*   Lipid Profile: No results for input(s): CHOL, HDL, LDLCALC, TRIG, CHOLHDL, LDLDIRECT in the last 72 hours. Thyroid Function Tests: No results for input(s): TSH, T4TOTAL, FREET4, T3FREE, THYROIDAB in the last 72 hours. Anemia Panel: No results for input(s): VITAMINB12, FOLATE, FERRITIN, TIBC, IRON, RETICCTPCT in the last 72 hours. Urine analysis:    Component  Value Date/Time   COLORURINE YELLOW 10/24/2018 0721   APPEARANCEUR CLEAR 10/24/2018 0721   LABSPEC 1.025 10/24/2018 0721   PHURINE 5.0 10/24/2018 0721   GLUCOSEU NEGATIVE 10/24/2018 0721   HGBUR NEGATIVE 10/24/2018 0721   BILIRUBINUR NEGATIVE 10/24/2018 0721   KETONESUR NEGATIVE 10/24/2018 0721   PROTEINUR NEGATIVE 10/24/2018 0721   NITRITE NEGATIVE 10/24/2018 0721   LEUKOCYTESUR NEGATIVE 10/24/2018 0721   No results found for this or any previous visit (from the past 240 hour(s)).    Radiology Studies: No results found.  Scheduled Meds: . sodium chloride   Intravenous Once  . clonazepam  0.5 mg Oral BID  . [START ON 11/14/2018] enoxaparin (LOVENOX) injection  50 mg Subcutaneous Q24H  . insulin aspart  0-20 Units Subcutaneous TID WC  . insulin aspart  5 Units Subcutaneous TID WC  . insulin detemir  10 Units Subcutaneous QHS  . methylPREDNISolone (SOLU-MEDROL) injection  20 mg Intravenous Daily  . pantoprazole  40 mg Oral Daily  . Ensure Max Protein  11 oz Oral BID  . sertraline  50 mg Oral Daily  . sodium chloride flush  10-40  mL Intracatheter Q12H   Continuous Infusions:   LOS: 34 days   Time spent: 25 minutes.  Tyrone Nineyan B Ayeisha Lindenberger, MD [facility] [email or web address] Password [email or web address] 11/13/2018, 4:31 PM

## 2018-11-13 NOTE — Progress Notes (Signed)
Her inflammatory markers have stablilized. Ok to reduce lovenox to 0.5mg /kg/day per Dr. Bonner Puna.  Onnie Boer, PharmD, BCIDP, AAHIVP, CPP Infectious Disease Pharmacist 11/13/2018 11:53 AM

## 2018-11-13 NOTE — Progress Notes (Signed)
Patient up in chair whole shift, tolerated without problems. Sats decreased to 82% with transfer to bedside commode, but returned to 92% once sitting. Oxygen weaned to 7L HFNC.

## 2018-11-13 NOTE — Progress Notes (Signed)
Spoke with patient's granddaughter, Sunday Spillers, states she will update family. Updated on plan of care and answered all questions.

## 2018-11-14 LAB — COMPREHENSIVE METABOLIC PANEL WITH GFR
ALT: 51 U/L — ABNORMAL HIGH (ref 0–44)
AST: 16 U/L (ref 15–41)
Albumin: 2.7 g/dL — ABNORMAL LOW (ref 3.5–5.0)
Alkaline Phosphatase: 32 U/L — ABNORMAL LOW (ref 38–126)
Anion gap: 9 (ref 5–15)
BUN: 22 mg/dL (ref 8–23)
CO2: 27 mmol/L (ref 22–32)
Calcium: 8.7 mg/dL — ABNORMAL LOW (ref 8.9–10.3)
Chloride: 105 mmol/L (ref 98–111)
Creatinine, Ser: 0.72 mg/dL (ref 0.44–1.00)
GFR calc Af Amer: >60 mL/min
GFR calc non Af Amer: >60 mL/min
Glucose, Bld: 73 mg/dL (ref 70–99)
Potassium: 3.5 mmol/L (ref 3.5–5.1)
Sodium: 141 mmol/L (ref 135–145)
Total Bilirubin: 0.4 mg/dL (ref 0.3–1.2)
Total Protein: 5.7 g/dL — ABNORMAL LOW (ref 6.5–8.1)

## 2018-11-14 LAB — GLUCOSE, CAPILLARY
Glucose-Capillary: 89 mg/dL (ref 70–99)
Glucose-Capillary: 122 mg/dL — ABNORMAL HIGH (ref 70–99)
Glucose-Capillary: 205 mg/dL — ABNORMAL HIGH (ref 70–99)
Glucose-Capillary: 142 mg/dL — ABNORMAL HIGH (ref 70–99)

## 2018-11-14 LAB — CBC WITH DIFFERENTIAL/PLATELET
Abs Immature Granulocytes: 0.13 K/uL — ABNORMAL HIGH (ref 0.00–0.07)
Basophils Absolute: 0 K/uL (ref 0.0–0.1)
Basophils Relative: 0 %
Eosinophils Absolute: 0.3 K/uL (ref 0.0–0.5)
Eosinophils Relative: 2 %
HCT: 34.6 % — ABNORMAL LOW (ref 36.0–46.0)
Hemoglobin: 10.6 g/dL — ABNORMAL LOW (ref 12.0–15.0)
Immature Granulocytes: 1 %
Lymphocytes Relative: 19 %
Lymphs Abs: 2.2 K/uL (ref 0.7–4.0)
MCH: 30.5 pg (ref 26.0–34.0)
MCHC: 30.6 g/dL (ref 30.0–36.0)
MCV: 99.4 fL (ref 80.0–100.0)
Monocytes Absolute: 0.9 K/uL (ref 0.1–1.0)
Monocytes Relative: 8 %
Neutro Abs: 7.7 K/uL (ref 1.7–7.7)
Neutrophils Relative %: 70 %
Platelets: 292 K/uL (ref 150–400)
RBC: 3.48 MIL/uL — ABNORMAL LOW (ref 3.87–5.11)
RDW: 16.7 % — ABNORMAL HIGH (ref 11.5–15.5)
WBC: 11.1 K/uL — ABNORMAL HIGH (ref 4.0–10.5)
nRBC: 0 % (ref 0.0–0.2)

## 2018-11-14 LAB — D-DIMER, QUANTITATIVE: D-Dimer, Quant: 0.36 ug/mL-FEU (ref 0.00–0.50)

## 2018-11-14 LAB — C-REACTIVE PROTEIN: CRP: <0.8 mg/dL

## 2018-11-14 MED ORDER — POLYETHYLENE GLYCOL 3350 17 G PO PACK
17.0000 g | PACK | Freq: Every day | ORAL | Status: DC
  Administered 2018-11-14 – 2018-11-16 (×3): 17 g via ORAL
  Filled 2018-11-14 (×4): qty 1

## 2018-11-14 MED ORDER — POTASSIUM CHLORIDE CRYS ER 20 MEQ PO TBCR
40.0000 meq | EXTENDED_RELEASE_TABLET | Freq: Three times a day (TID) | ORAL | Status: AC
  Administered 2018-11-14 (×2): 40 meq via ORAL
  Filled 2018-11-14 (×2): qty 2

## 2018-11-14 MED ORDER — METHYLPREDNISOLONE SODIUM SUCC 40 MG IJ SOLR
10.0000 mg | Freq: Every day | INTRAMUSCULAR | Status: DC
  Administered 2018-11-14: 10 mg via INTRAVENOUS
  Filled 2018-11-14: qty 1

## 2018-11-14 NOTE — Progress Notes (Signed)
Nutrition Follow-up  DOCUMENTATION CODES:   Morbid obesity  INTERVENTION:    Continue Ensure Max po BID, each supplement provides 150 kcal and 30 grams of protein.   Continue Hormel Shake daily with Breakfast which provides 520 kcals and 22 g of protein and Magic cup BID with lunch and dinner, each supplement provides 290 kcal and 9 grams of protein, automatically on meal trays to optimize nutritional intake.   NUTRITION DIAGNOSIS:   Increased nutrient needs related to acute illness as evidenced by estimated needs.  Ongoing   GOAL:   Patient will meet greater than or equal to 90% of their needs  Progressing   MONITOR:   PO intake, Supplement acceptance, Labs  ASSESSMENT:   68 yo Spanish speaking female admitted with COVID PNA. PMH of morbid obesity, HTN, depression.  Patient remains on 6-7 L HFNC over the past 24 hours.  She has been working with PT, walking. Hopeful for d/c home soon. She is consuming 60-100% of meals; po intake has improved. She is also drinking Ensure Max BID. Suspect intake of meals and supplements is meeting nutrition needs.  Labs and medications reviewed.  Diet Order:   Diet Order            DIET DYS 3 Room service appropriate? Yes; Fluid consistency: Thin  Diet effective now              EDUCATION NEEDS:   Not appropriate for education at this time  Skin:  Skin Assessment: Reviewed RN Assessment(MASD to buttocks)  Last BM:  7/14 type 6  Height:   Ht Readings from Last 1 Encounters:  10/10/18 5\' 4"  (1.626 m)    Weight:   Wt Readings from Last 1 Encounters:  11/03/18 97.6 kg    Ideal Body Weight:  54.5 kg  BMI:  Body mass index is 36.93 kg/m.  Estimated Nutritional Needs:   Kcal:  1610-9604  Protein:  100-125 gm  Fluid:  1.8 L    Molli Barrows, RD, LDN, CNSC Pager [phone] After Hours Pager [phone]

## 2018-11-14 NOTE — Progress Notes (Signed)
Messaged md via epic chat to clarify on order for proning of pt. Previous to this order pt had not been proned per Dr Candiss Norse due to CHF exacerbation. Awaiting response.

## 2018-11-14 NOTE — Progress Notes (Signed)
Physical Therapy Treatment Patient Details Name: Christina Clark MRN: [id number] DOB: 09-Oct-1950 Today's Date: 11/14/2018    History of Present Illness 68 year old Spanish-speaking female with past medical history of morbid obesity, hypertension and depression  presenting to the emergency room 10/10/18 with 1 week of fever, cough and shortness of breath in the setting of known COVID-19 contacts.  Patient found to be COVID positive as well as hypoxic with bilateral infiltrates on chest x-ray.  Requiring oxygen support of high flow    PT Comments    The patient is ambulating short distances, moves quickly and then desats down to 76% on 10 L HFNC. HR 125, RR 44. Patient does recover  Back to 90% and RR and HR drop , which is much improved for recovery. Encouraged  P[atient to try to increase distance, slow down speed for second walk, even attempted to have  Pt. Stand at sink and wash hands but patient ambulated right on back to recliner.   Follow Up Recommendations  CIR     Equipment Recommendations  Rolling walker with 5" wheels    Recommendations for Other Services       Precautions / Restrictions Precautions Precautions: Fall Precaution Comments: HFNC 8L Restrictions Weight Bearing Restrictions: No    Mobility  Bed Mobility Overal bed mobility: Needs Assistance Bed Mobility: Supine to Sit Rolling: Supervision   Supine to sit: Min assist Sit to supine: Supervision   General bed mobility comments: OOB in recliner  Transfers Overall transfer level: Needs assistance Equipment used: Rolling walker (2 wheeled) Transfers: Sit to/from [facility] Sit to Stand: Min guard Stand pivot transfers: Min guard       General transfer comment: moves quickly for transfers due to dyspnea; Able to take several steps to [facility]  Ambulation/Gait Ambulation/Gait assistance: Min assist;+2 safety/equipment Gait Distance (Feet): 14 Feet(x 2)             Stairs              Wheelchair Mobility    Modified Rankin (Stroke Patients Only)       Balance Overall balance assessment: Needs assistance Sitting-balance support: No upper extremity supported;Feet supported Sitting balance-Leahy Scale: Good     Standing balance support: Bilateral upper extremity supported;During functional activity Standing balance-Leahy Scale: Fair Standing balance comment: reliant on support for dynamic movement                            Cognition Arousal/Alertness: Awake/alert Behavior During Therapy: WFL for tasks assessed/performed Overall Cognitive Status: Within Functional Limits for tasks assessed                                 General Comments: Motivated to participate in therapy. Granddaughter Eyvonne Mechanic) on phone, patient much less anxious about mobilizing (although still enjoys eating a peppermint prior to transfers/mobility) and does not need as much time to recover      Exercises      General Comments General comments (skin integrity, edema, etc.): SpO2 dropping to 76% on 8L of O2 via HFNC      Pertinent Vitals/Pain Pain Assessment: Faces Faces Pain Scale: Hurts little more Pain Location: right shoulder Pain Descriptors / Indicators: Discomfort;Grimacing Pain Intervention(s): Monitored during session;Limited activity within patient's tolerance;Repositioned    Home Living Family/patient expects to be discharged to:: Private residence Living Arrangements: Children Available Help at Discharge: Family;Available  24 hours/day Type of Home: House Home Access: Stairs to enter   Home Layout: One level Home Equipment: None      Prior Function Level of Independence: Independent          PT Goals (current goals can now be found in the care plan section) Acute Rehab PT Goals Patient Stated Goal: "get strong enough to go home" Progress towards PT goals: Progressing toward goals    Frequency           PT Plan  Current plan remains appropriate    Co-evaluation PT/OT/SLP Co-Evaluation/Treatment: Yes Reason for Co-Treatment: For patient/therapist safety;Complexity of the patient's impairments (multi-system involvement) PT goals addressed during session: Mobility/safety with mobility OT goals addressed during session: ADL's and self-care      AM-PAC PT "6 Clicks" Mobility   Outcome Measure  Help needed turning from your back to your side while in a flat bed without using bedrails?: A Little Help needed moving from lying on your back to sitting on the side of a flat bed without using bedrails?: A Little Help needed moving to and from a bed to a chair (including a wheelchair)?: A Little Help needed standing up from a chair using your arms (e.g., wheelchair or bedside chair)?: A Little Help needed to walk in hospital room?: A Lot Help needed climbing 3-5 steps with a railing? : Total 6 Click Score: 15    End of Session Equipment Utilized During Treatment: Gait belt;Oxygen Activity Tolerance: Patient tolerated treatment well Patient left: in chair;with call bell/phone within reach Nurse Communication: Mobility status PT Visit Diagnosis: Difficulty in walking, not elsewhere classified (R26.2)     Time: [id number] PT Time Calculation (min) (ACUTE ONLY): 24 min  Charges:  $Gait Training: 8-22 mins                     Blanchard KelchKaren Luddie Boghosian PT Acute Rehabilitation Services  Office [phone]    Rada HayHill, Terina Mcelhinny [name] 11/14/2018, 2:51 PM

## 2018-11-14 NOTE — Progress Notes (Signed)
Occupational Therapy Treatment Patient Details Name: Christina Clark MRN: [id number] DOB: 11-May-1950 Today's Date: 11/14/2018    History of present illness 68 year old Spanish-speaking female with past medical history of morbid obesity, hypertension and depression  presenting to the emergency room 10/10/18 with 1 week of fever, cough and shortness of breath in the setting of known COVID-19 contacts.  Patient found to be COVID positive as well as hypoxic with bilateral infiltrates on chest x-ray.  Requiring oxygen support of high flow   OT comments  Pt progressing towards established OT goals. Pt performing functional mobility to [facility] placed on far end of room with Min Guard A +2 and RW. Pt SpO2 dropping to 76% on 8L O2; cued for slow purse lip breathing. Pt performing toileting with Min Guard A. Cueing pt to slow down and take her time during movements. Continue to recommend dc to CIR and will continue to follow acutely as admitted.    Follow Up Recommendations  CIR;Supervision/Assistance - 24 hour    Equipment Recommendations  3 in 1 bedside commode    Recommendations for Other Services Rehab consult    Precautions / Restrictions Precautions Precautions: Fall Precaution Comments: HFNC 8L Restrictions Weight Bearing Restrictions: No       Mobility Bed Mobility Overal bed mobility: Needs Assistance Bed Mobility: Supine to Sit Rolling: Supervision   Supine to sit: Min assist Sit to supine: Supervision   General bed mobility comments: OOB in recliner  Transfers Overall transfer level: Needs assistance Equipment used: Rolling walker (2 wheeled) Transfers: Sit to/from [facility] Sit to Stand: Min guard Stand pivot transfers: Min guard       General transfer comment: moves quickly for transfers due to dyspnea; Able to take several steps to [facility]    Balance Overall balance assessment: Needs assistance Sitting-balance support: No upper extremity  supported;Feet supported Sitting balance-Leahy Scale: Good     Standing balance support: Bilateral upper extremity supported;During functional activity Standing balance-Leahy Scale: Fair Standing balance comment: reliant on support for dynamic movement                           ADL either performed or assessed with clinical judgement   ADL Overall ADL's : Needs assistance/impaired     Grooming: Wash/dry hands;Sitting Grooming Details (indicate cue type and reason): Requiring hand sanizer and perform hand hygiene sitting in recliner                 Toilet Transfer: Min guard;Ambulation;BSC;RW Toilet Transfer Details (indicate cue type and reason): Min Guard A for safety. Cues for decreasing speed of mobility Toileting- Clothing Manipulation and Hygiene: Min guard;Sit to/from stand       Functional mobility during ADLs: Min guard;Rolling walker;+2 for safety/equipment;+2 for physical assistance General ADL Comments: Pt performing mobility to [facility] placed at far end of room. Cues for pt to slow down and take her time     Vision       Perception     Praxis      Cognition Arousal/Alertness: Awake/alert Behavior During Therapy: [facility] for tasks assessed/performed Overall Cognitive Status: Within Functional Limits for tasks assessed                                 General Comments: Motivated to participate in therapy. Granddaughter Eyvonne Mechanic) on phone, patient much less anxious about mobilizing (although still enjoys eating a  peppermint prior to transfers/mobility) and does not need as much time to recover        Exercises     Shoulder Instructions       General Comments SpO2 dropping to 76% on 8L of O2 via HFNC    Pertinent Vitals/ Pain       Pain Assessment: Faces Faces Pain Scale: Hurts little more Pain Location: right shoulder Pain Descriptors / Indicators: Discomfort;Grimacing Pain Intervention(s): Monitored during session;Limited  activity within patient's tolerance;Repositioned  Home Living Family/patient expects to be discharged to:: Private residence Living Arrangements: Children Available Help at Discharge: Family;Available 24 hours/day Type of Home: House Home Access: Stairs to enter Enter[employer] CorporationEntrance Stairs-Number of Steps: 4   Home Layout: One level     Bathroom Shower/Tub: Chief Strategy OfficerTub/shower unit   Bathroom Toilet: Standard Bathroom Accessibility: Yes How Accessible: Accessible via walker Home Equipment: None          Prior Functioning/Environment Level of Independence: Independent            Frequency  Min 3X/week        Progress Toward Goals  OT Goals(current goals can now be found in the care plan section)     Acute Rehab OT Goals Patient Stated Goal: "get strong enough to go home" OT Goal Formulation: With patient/family Time For Goal Achievement: 11/19/18 Potential to Achieve Goals: Good ADL Goals Pt Will Perform Upper Body Bathing: with modified independence;sitting Pt Will Perform Lower Body Bathing: with modified independence;sit to/from stand;with adaptive equipment Pt Will Perform Lower Body Dressing: with modified independence;sit to/from stand;with adaptive equipment Pt Will Transfer to Toilet: with modified independence;ambulating;bedside commode Pt Will Perform Toileting - Clothing Manipulation and hygiene: with modified independence;sit to/from stand Pt/caregiver will Perform Home Exercise Program: Increased strength;With theraband;Both right and left upper extremity;With Supervision;With written HEP provided Additional ADL Goal #1: Pt will independently verbalize 3 energy conservation strategies for ADL  Plan Discharge plan remains appropriate    Co-evaluation    PT/OT/SLP Co-Evaluation/Treatment: Yes Reason for Co-Treatment: For patient/therapist safety;To address functional/ADL transfers   OT goals addressed during session: ADL's and self-care      AM-PAC OT "6 Clicks"  Daily Activity     Outcome Measure   Help from another person eating meals?: None Help from another person taking care of personal grooming?: A Little Help from another person toileting, which includes using toliet, bedpan, or urinal?: A Little Help from another person bathing (including washing, rinsing, drying)?: A Lot Help from another person to put on and taking off regular upper body clothing?: A Little Help from another person to put on and taking off regular lower body clothing?: A Lot 6 Click Score: 17    End of Session Equipment Utilized During Treatment: Oxygen;Rolling walker;Gait belt(8L via HFNC)  OT Visit Diagnosis: Unsteadiness on feet (R26.81);Muscle weakness (generalized) (M62.81)   Activity Tolerance Patient tolerated treatment well   Patient Left in chair;with call bell/phone within reach   Nurse Communication Mobility status        Time: [id number] OT Time Calculation (min): 26 min  Charges: OT General Charges $OT Visit: 1 Visit OT Treatments $Self Care/Home Management : 8-22 mins  Syrenity Klepacki MSOT, OTR/L Acute Rehab Pager: [phone] Office: [phone]   Theodoro GristCharis M Gwin Eagon 11/14/2018, 2:27 PM

## 2018-11-14 NOTE — Progress Notes (Signed)
Called to update pt's daughter Tabbetha. Have spoken with her several times via telephone in pt's room today. No answer at this time. Message left.

## 2018-11-14 NOTE — Progress Notes (Signed)
[facility] PROGRESS NOTE    Progress Note  Christina Clark  KZL:[id number] DOB: [birth year] DOA: 10/10/2018 PCP: Patient, No Pcp Per     Brief Narrative:   Christina Clark is an 68 y.o. female past medical history of morbid obesity, hypertension and depression presents with acute respiratory failure due to COVID-19 pneumonia, he was treated with IV Remdesivir her Actemra and steroids.  Unfortunately his hospital course has been prolonged due to persistent hypoxia  Assessment/Plan:   Acute respiratory failure with hypoxia ([place]) due to COVID-19 viral infection: Still remain dependence on high level of oxygen supplementation. Complete his course of IV Remdesivir, he was given Actemra and were tapering down steroids. Continue to wean off oxygen as tolerated, still requiring 8 L of oxygen to keep saturation above 88%. Try to keep the patient prone as much as possible.  Paroxysmal supraventricular tachycardia: Noted on telemetry and 11/11/2018 she remained asymptomatic.  Now in sinus rhythm asymptomatic. Try to keep potassium greater than 4 magnesium greater than 2.  Elevated LFTs likely due to SARS-CoV-2 infection: They have been improving will have to follow-up with PCP as an outpatient.  Acute on chronic diastolic heart failure: Appears to be euvolemic continue to keep net equal.  Essential hypertension: Continue antihypertensive medication ARB and hydrochlorothiazide.  Depression: Having significant amount of anxiety will hospitalize, continue low-dose Xanax continue home dose of Zoloft.  Uncontrolled diabetes mellitus type 2: With an A1c of 8.5.  She is currently on no medications at home. Here she is on long-acting insulin plus sliding scale in the setting of steroid use which were tapering down. I expect these to continue to improve slowly as we wean her off the steroids.  Leucocytosis: Likely due to steroids.  Hypokalemia: Continue supplement potassium  to to keep it above 4.   DVT prophylaxis: lovenox Family Communication:none Disposition Plan/Barrier to D/C: home once on < 4L of oxygen Code Status:     Code Status Orders  (From admission, onward)         Start     Ordered   10/26/18 1102  Limited resuscitation (code)  Continuous    Question Answer Comment  In the event of cardiac or respiratory ARREST: Initiate Code Blue, Call Rapid Response Yes   In the event of cardiac or respiratory ARREST: Perform CPR No   In the event of cardiac or respiratory ARREST: Perform Intubation/Mechanical Ventilation No   In the event of cardiac or respiratory ARREST: Use NIPPV/BiPAp only if indicated Yes   In the event of cardiac or respiratory ARREST: Administer ACLS medications if indicated Yes   In the event of cardiac or respiratory ARREST: Perform Defibrillation or Cardioversion if indicated Yes      10/26/18 1101        Code Status History    Date Active Date Inactive Code Status Order ID Comments User Context   10/11/2018 0031 10/26/2018 1101 Full Code [id number]  Annita Brod, MD Inpatient   Advance Care Planning Activity        IV Access:    Peripheral IV   Procedures and diagnostic studies:   No results found.   Medical Consultants:    None.  Anti-Infectives:   None  Subjective:    Christina Clark relates her breathing is better she feels gets winded with ambulation but she feels much better compared to when she came on admission.  Objective:    Vitals:   11/13/18 1802 11/13/18 2100 11/14/18 0435 11/14/18  0824  BP:  118/64 (!) 122/49 132/68  Pulse: 95 82 85 93  Resp: (!) 33 (!) 24 (!) 23 (!) 30  Temp:  98.3 F (36.8 C) 98.7 F (37.1 C) 98.3 F (36.8 C)  TempSrc:  Oral Oral Oral  SpO2: 93% 92% 91% 94%  Weight:      Height:       SpO2: 94 % O2 Flow Rate (L/min): 7 L/min FiO2 (%): 100 %   Intake/Output Summary (Last 24 hours) at 11/14/2018 0927 Last data filed at 11/14/2018 0630 Gross  per 24 hour  Intake 1570 ml  Output 102 ml  Net 1468 ml   Filed Weights   10/10/18 2300 11/03/18 0255  Weight: 108.7 kg 97.6 kg    Exam: General exam: In no acute distress. Respiratory system: Good air movement and diffuse crackles to auscultation Cardiovascular system: S1 & S2 heard, RRR. No JVD. Gastrointestinal system: Abdomen is nondistended, soft and nontender.  Central nervous system: Alert and oriented. No focal neurological deficits. Extremities: No pedal edema. Skin: No rashes, lesions or ulcers Psychiatry: Judgement and insight appear normal. Mood & affect appropriate.   Data Reviewed:    Labs: Basic Metabolic Panel: Recent Labs  Lab 11/07/18 0950 11/08/18 0503 11/09/18 0446 11/12/18 0545 11/14/18 0500  NA 138 138 139 141 141  K 3.1* 3.2* 4.3 3.9 3.5  CL 100 100 105 105 105  CO2 25 28 27 26 27   GLUCOSE 177* 70 77 81 73  BUN 31* 31* 30* 26* 22  CREATININE 0.79 0.71 0.70 0.78 0.72  CALCIUM 8.6* 8.5* 8.5* 8.4* 8.7*  MG 2.3 2.4  --   --   --    GFR Estimated Creatinine Clearance: 78.5 mL/min (by C-G formula based on SCr of 0.72 mg/dL). Liver Function Tests: Recent Labs  Lab 11/07/18 0950 11/08/18 0503 11/09/18 0446 11/12/18 0545 11/14/18 0500  AST 19 18 17 16 16   ALT 45* 46* 49* 49* 51*  ALKPHOS 40 37* 35* 28* 32*  BILITOT 0.4 0.2* 0.3 0.2* 0.4  PROT 5.9* 5.9* 5.8* 5.6* 5.7*  ALBUMIN 2.7* 2.7* 2.6* 2.7* 2.7*   No results for input(s): LIPASE, AMYLASE in the last 168 hours. No results for input(s): AMMONIA in the last 168 hours. Coagulation profile No results for input(s): INR, PROTIME in the last 168 hours. COVID-19 Labs  Recent Labs    11/14/18 0500  DDIMER 0.36  CRP <0.8    Lab Results  Component Value Date   SARSCOV2NAA POSITIVE (A) 10/10/2018    CBC: Recent Labs  Lab 11/07/18 0950 11/08/18 0503 11/09/18 0446 11/12/18 0545 11/14/18 0500  WBC 9.3 10.3 11.4* 11.5* 11.1*  NEUTROABS 6.5 7.0 7.9* 7.7 7.7  HGB 12.1 11.5* 11.3*  11.1* 10.6*  HCT 38.0 37.5 35.7* 35.0* 34.6*  MCV 99.0 98.7 98.6 99.4 99.4  PLT 306 328 346 324 292   Cardiac Enzymes: No results for input(s): CKTOTAL, CKMB, CKMBINDEX, TROPONINI in the last 168 hours. BNP (last 3 results) No results for input(s): PROBNP in the last 8760 hours. CBG: Recent Labs  Lab 11/13/18 0622 11/13/18 1206 11/13/18 1659 11/13/18 2202 11/14/18 0823  GLUCAP 76 203* 246* 126* 89   D-Dimer: Recent Labs    11/14/18 0500  DDIMER 0.36   Hgb A1c: No results for input(s): HGBA1C in the last 72 hours. Lipid Profile: No results for input(s): CHOL, HDL, LDLCALC, TRIG, CHOLHDL, LDLDIRECT in the last 72 hours. Thyroid function studies: No results for input(s): TSH, T4TOTAL, T3FREE,  THYROIDAB in the last 72 hours.  Invalid input(s): FREET3 Anemia work up: No results for input(s): VITAMINB12, FOLATE, FERRITIN, TIBC, IRON, RETICCTPCT in the last 72 hours. Sepsis Labs: Recent Labs  Lab 11/08/18 0503 11/09/18 0446 11/12/18 0545 11/14/18 0500  WBC 10.3 11.4* 11.5* 11.1*   Microbiology No results found for this or any previous visit (from the past 240 hour(s)).   Medications:   . sodium chloride   Intravenous Once  . clonazepam  0.5 mg Oral BID  . enoxaparin (LOVENOX) injection  50 mg Subcutaneous Q24H  . insulin aspart  0-20 Units Subcutaneous TID WC  . insulin aspart  5 Units Subcutaneous TID WC  . insulin detemir  10 Units Subcutaneous QHS  . methylPREDNISolone (SOLU-MEDROL) injection  20 mg Intravenous Daily  . pantoprazole  40 mg Oral Daily  . Ensure Max Protein  11 oz Oral BID  . sertraline  50 mg Oral Daily  . sodium chloride flush  10-40 mL Intracatheter Q12H   Continuous Infusions:   LOS: 35 days   Marinda ElkAbraham Feliz Ortiz  [facility]  11/14/2018, 9:27 AM

## 2018-11-14 NOTE — Progress Notes (Signed)
Notified MD that pt has had 150 out overnight and 350 out on this shift. MD aware and no further orders.

## 2018-11-15 LAB — BASIC METABOLIC PANEL
Anion gap: 8 (ref 5–15)
BUN: 17 mg/dL (ref 8–23)
CO2: 27 mmol/L (ref 22–32)
Calcium: 8.8 mg/dL — ABNORMAL LOW (ref 8.9–10.3)
Chloride: 106 mmol/L (ref 98–111)
Creatinine, Ser: 0.56 mg/dL (ref 0.44–1.00)
GFR calc Af Amer: >60 mL/min (ref >60)
GFR calc non Af Amer: >60 mL/min (ref >60)
Glucose, Bld: 72 mg/dL (ref 70–99)
Potassium: 4.2 mmol/L (ref 3.5–5.1)
Sodium: 141 mmol/L (ref 135–145)

## 2018-11-15 LAB — GLUCOSE, CAPILLARY
Glucose-Capillary: 82 mg/dL (ref 70–99)
Glucose-Capillary: 97 mg/dL (ref 70–99)
Glucose-Capillary: 141 mg/dL — ABNORMAL HIGH (ref 70–99)
Glucose-Capillary: 108 mg/dL — ABNORMAL HIGH (ref 70–99)

## 2018-11-15 MED ORDER — METHYLPREDNISOLONE SODIUM SUCC 40 MG IJ SOLR: 5.0000 mg | Freq: Every day | INTRAMUSCULAR | Status: DC

## 2018-11-15 MED ORDER — INSULIN ASPART 100 UNIT/ML ~~LOC~~ SOLN
0.0000 [IU] | Freq: Three times a day (TID) | SUBCUTANEOUS | Status: DC
  Administered 2018-11-15: 1 [IU] via SUBCUTANEOUS
  Administered 2018-11-16: 2 [IU] via SUBCUTANEOUS
  Administered 2018-11-17: 1 [IU] via SUBCUTANEOUS
  Administered 2018-11-17: 2 [IU] via SUBCUTANEOUS
  Administered 2018-11-18: 1 [IU] via SUBCUTANEOUS

## 2018-11-15 MED ORDER — INSULIN DETEMIR 100 UNIT/ML ~~LOC~~ SOLN
5.0000 [IU] | Freq: Every day | SUBCUTANEOUS | Status: DC
  Administered 2018-11-15: 5 [IU] via SUBCUTANEOUS
  Filled 2018-11-15: qty 0.05

## 2018-11-15 MED ORDER — PREDNISONE 10 MG PO TABS
5.0000 mg | ORAL_TABLET | Freq: Every day | ORAL | Status: DC
  Administered 2018-11-15: 5 mg via ORAL
  Filled 2018-11-15: qty 1

## 2018-11-15 MED ORDER — INSULIN ASPART 100 UNIT/ML ~~LOC~~ SOLN
3.0000 [IU] | Freq: Three times a day (TID) | SUBCUTANEOUS | Status: DC
  Administered 2018-11-15 – 2018-11-18 (×11): 3 [IU] via SUBCUTANEOUS

## 2018-11-15 MED ORDER — INSULIN ASPART 100 UNIT/ML ~~LOC~~ SOLN: 0.0000 [IU] | Freq: Every day | SUBCUTANEOUS | Status: DC

## 2018-11-15 NOTE — Progress Notes (Signed)
[facility] PROGRESS NOTE    Progress Note  Christina Clark  ZOX:[id number] DOB: 1950/05/05 DOA: 10/10/2018 PCP: Patient, No Pcp Per     Brief Narrative:   Christina Clark is an 68 y.o. female past medical history of morbid obesity, hypertension and depression presents with acute respiratory failure due to COVID-19 pneumonia, he was treated with IV Remdesivir her Actemra and steroids.  Unfortunately his hospital course has been prolonged due to persistent hypoxia  Assessment/Plan:   Acute respiratory failure with hypoxia ([facility]) due to COVID-19 viral infection: Still remain dependence on high level of oxygen supplementation. Complete his course of IV Remdesivir, he was given Actemra and were tapering down steroids. She still requiring high flow nasal cannula 5 L to keep saturation above 91%. Continue to keep the patient prone for at least 16 hours a day, and continue to keep her euvolemic.  Paroxysmal supraventricular tachycardia: Noted on telemetry on 11/11/2018 asymptomatic, now in sinus rhythm.. Try to keep potassium greater than 4 magnesium greater than 2.  Elevated LFTs likely due to SARS-CoV-2 infection: They have been improving will have to follow-up with PCP as an outpatient.  Acute on chronic diastolic heart failure: Appears to be euvolemic continue to keep net equal.  Essential hypertension: Continue antihypertensive medication ARB and hydrochlorothiazide.  Depression: Having significant amount of anxiety will hospitalize, continue low-dose Xanax continue home dose of Zoloft.  Uncontrolled diabetes mellitus type 2: With an A1c of 8.5.  She is currently on no medications at home. Here she is on long-acting insulin plus sliding scale in the setting of steroid use which were tapering down. As we are decreasing her steroids will decrease her long-acting insulin, modify her sliding scale to sensitive.  Leucocytosis: Likely due to steroids.  Hypokalemia:  Continue supplement potassium to to keep it above 4.   DVT prophylaxis: lovenox Family Communication:none Disposition Plan/Barrier to D/C: home once on < 4L of oxygen Code Status:     Code Status Orders  (From admission, onward)         Start     Ordered   10/26/18 1102  Limited resuscitation (code)  Continuous    Question Answer Comment  In the event of cardiac or respiratory ARREST: Initiate Code Blue, Call Rapid Response Yes   In the event of cardiac or respiratory ARREST: Perform CPR No   In the event of cardiac or respiratory ARREST: Perform Intubation/Mechanical Ventilation No   In the event of cardiac or respiratory ARREST: Use NIPPV/BiPAp only if indicated Yes   In the event of cardiac or respiratory ARREST: Administer ACLS medications if indicated Yes   In the event of cardiac or respiratory ARREST: Perform Defibrillation or Cardioversion if indicated Yes      10/26/18 1101        Code Status History    Date Active Date Inactive Code Status Order ID Comments User Context   10/11/2018 0031 10/26/2018 1101 Full Code [id number]  Hollice EspyKrishnan, Sendil K, MD Inpatient   Advance Care Planning Activity        IV Access:    Peripheral IV   Procedures and diagnostic studies:   No results found.   Medical Consultants:    None.  Anti-Infectives:   None  Subjective:    Christina Clark relates his breathing is much better than yesterday.  Objective:    Vitals:   11/14/18 1649 11/14/18 1958 11/15/18 0417 11/15/18 0715  BP: (!) 117/53 120/66 (!) 106/38 131/69  Pulse: 75  72 76 74  Resp: (!) 21 (!) 27 (!) 26 (!) 25  Temp: 98 F (36.7 C) 98.1 F (36.7 C) 99.1 F (37.3 C) 98.4 F (36.9 C)  TempSrc: Oral Oral Oral Oral  SpO2: 95% 95% 91% 90%  Weight:      Height:       SpO2: 90 % O2 Flow Rate (L/min): 5 L/min FiO2 (%): 100 %   Intake/Output Summary (Last 24 hours) at 11/15/2018 0800 Last data filed at 11/15/2018 0630 Gross per 24 hour  Intake  2160 ml  Output 750 ml  Net 1410 ml   Filed Weights   10/10/18 2300 11/03/18 0255  Weight: 108.7 kg 97.6 kg    Exam: General exam: In no acute distress. Respiratory system: Good air movement and crackles bilaterally. Cardiovascular system: S1 & S2 heard, RRR. No JVD. Gastrointestinal system: Abdomen is nondistended, soft and nontender.  Central nervous system: Alert and oriented. No focal neurological deficits. Extremities: No pedal edema. Skin: No rashes, lesions or ulcers Psychiatry: Judgement and insight appear normal. Mood & affect appropriate.    Data Reviewed:    Labs: Basic Metabolic Panel: Recent Labs  Lab 11/09/18 0446 11/12/18 0545 11/14/18 0500 11/15/18 0400  NA 139 141 141 141  K 4.3 3.9 3.5 4.2  CL 105 105 105 106  CO2 27 26 27 27   GLUCOSE 77 81 73 72  BUN 30* 26* 22 17  CREATININE 0.70 0.78 0.72 0.56  CALCIUM 8.5* 8.4* 8.7* 8.8*   GFR Estimated Creatinine Clearance: 78.5 mL/min (by C-G formula based on SCr of 0.56 mg/dL). Liver Function Tests: Recent Labs  Lab 11/09/18 0446 11/12/18 0545 11/14/18 0500  AST 17 16 16   ALT 49* 49* 51*  ALKPHOS 35* 28* 32*  BILITOT 0.3 0.2* 0.4  PROT 5.8* 5.6* 5.7*  ALBUMIN 2.6* 2.7* 2.7*   No results for input(s): LIPASE, AMYLASE in the last 168 hours. No results for input(s): AMMONIA in the last 168 hours. Coagulation profile No results for input(s): INR, PROTIME in the last 168 hours. COVID-19 Labs  Recent Labs    11/14/18 0500  DDIMER 0.36  CRP <0.8    Lab Results  Component Value Date   SARSCOV2NAA POSITIVE (A) 10/10/2018    CBC: Recent Labs  Lab 11/09/18 0446 11/12/18 0545 11/14/18 0500  WBC 11.4* 11.5* 11.1*  NEUTROABS 7.9* 7.7 7.7  HGB 11.3* 11.1* 10.6*  HCT 35.7* 35.0* 34.6*  MCV 98.6 99.4 99.4  PLT 346 324 292   Cardiac Enzymes: No results for input(s): CKTOTAL, CKMB, CKMBINDEX, TROPONINI in the last 168 hours. BNP (last 3 results) No results for input(s): PROBNP in the last  8760 hours. CBG: Recent Labs  Lab 11/14/18 0823 11/14/18 1129 11/14/18 1648 11/14/18 2109 11/15/18 0742  GLUCAP 89 122* 205* 142* 82   D-Dimer: Recent Labs    11/14/18 0500  DDIMER 0.36   Hgb A1c: No results for input(s): HGBA1C in the last 72 hours. Lipid Profile: No results for input(s): CHOL, HDL, LDLCALC, TRIG, CHOLHDL, LDLDIRECT in the last 72 hours. Thyroid function studies: No results for input(s): TSH, T4TOTAL, T3FREE, THYROIDAB in the last 72 hours.  Invalid input(s): FREET3 Anemia work up: No results for input(s): VITAMINB12, FOLATE, FERRITIN, TIBC, IRON, RETICCTPCT in the last 72 hours. Sepsis Labs: Recent Labs  Lab 11/09/18 0446 11/12/18 0545 11/14/18 0500  WBC 11.4* 11.5* 11.1*   Microbiology No results found for this or any previous visit (from the past 240 hour(s)).  Medications:   . sodium chloride   Intravenous Once  . clonazepam  0.5 mg Oral BID  . enoxaparin (LOVENOX) injection  50 mg Subcutaneous Q24H  . insulin aspart  0-20 Units Subcutaneous TID WC  . insulin aspart  5 Units Subcutaneous TID WC  . insulin detemir  10 Units Subcutaneous QHS  . methylPREDNISolone (SOLU-MEDROL) injection  10 mg Intravenous Daily  . pantoprazole  40 mg Oral Daily  . polyethylene glycol  17 g Oral Daily  . Ensure Max Protein  11 oz Oral BID  . sertraline  50 mg Oral Daily  . sodium chloride flush  10-40 mL Intracatheter Q12H   Continuous Infusions:   LOS: 36 days   Marinda ElkAbraham Feliz Ortiz  [facility]  11/15/2018, 8:00 AM

## 2018-11-15 NOTE — Progress Notes (Signed)
Physical Therapy Treatment Patient Details Name: Christina Clark MRN: [id number] DOB: 04-19-1951 Today's Date: 11/15/2018    History of Present Illness 68 year old Spanish-speaking female with past medical history of morbid obesity, hypertension and depression  presenting to the emergency room 10/10/18 with 1 week of fever, cough and shortness of breath in the setting of known COVID-19 contacts.  Patient found to be COVID positive as well as hypoxic with bilateral infiltrates on chest x-ray.  Requiring oxygen support of high flow    PT Comments    Patient  Is slowly improving with mobility, just desats with activity. Placed Nelcor probe on forehead  And had a probe to room  monitor on left ear. readings are different with nelcor reading is ~ 6 points lower on forehead than on ear probe. Patient desats down to 69%( Nelcor). Patient recovers within 2 minutes back to 88%on forehead and 95%.  ' Continue  Increasing activity as patient tolerates. Patient stood and marched in place 10 steps then 20 steps x 3 sets. Patient expresses that ambulation "frightens her because she cannot get her breath"> granddaughter inperprets over phone.  Follow Up Recommendations  CIR     Equipment Recommendations  Rolling walker with 5" wheels    Recommendations for Other Services Rehab consult     Precautions / Restrictions Precautions Precautions: Fall Precaution Comments: HFNC desats with minimal activity Restrictions Weight Bearing Restrictions: No    Mobility  Bed Mobility               General bed mobility comments: OOB in recliner  Transfers Overall transfer level: Needs assistance Equipment used: Rolling walker (2 wheeled) Transfers: Stand Pivot Transfers Sit to Stand: Min guard Stand pivot transfers: Min assist       General transfer comment: slowly stands, marched in place x 10 stpes, rested then 20 steps in place x 3 sets with rest in between.Sao2 with Nelcor dropped to 69%,  on Ear 75%.  Ambulation/Gait                 Stairs             Wheelchair Mobility    Modified Rankin (Stroke Patients Only)       Balance Overall balance assessment: Needs assistance Sitting-balance support: No upper extremity supported;Feet supported Sitting balance-Leahy Scale: Good     Standing balance support: Single extremity supported;During functional activity Standing balance-Leahy Scale: Fair Standing balance comment: reliant on support for dynamic movement                            Cognition Arousal/Alertness: Awake/alert Behavior During Therapy: WFL for tasks assessed/performed Overall Cognitive Status: Within Functional Limits for tasks assessed                                 General Comments: Motivated to participate in therapy. [name] on phone, patient much less anxious about mobilizing (although still enjoys eating a peppermint prior to transfers/mobility) and does not need as much time to recover      Exercises General Exercises - Upper Extremity Shoulder Flexion: Strengthening;Both;Theraband;10 reps Theraband Level (Shoulder Flexion): Level 1 (Yellow) Shoulder Extension: AROM;Strengthening;Both;Seated;10 reps Shoulder ABduction: Strengthening;Seated;Theraband;10 reps Theraband Level (Shoulder Abduction): Level 1 (Yellow) Shoulder Horizontal ABduction: Strengthening;Both;Seated;10 reps;Theraband Theraband Level (Shoulder Horizontal Abduction): Level 1 (Yellow) Shoulder Horizontal ADduction: Strengthening;Both;Seated;10 reps;Theraband Theraband Level (Shoulder Horizontal Adduction): Level 1 (  Yellow) Elbow Flexion: Strengthening;Both;20 reps;Seated;Theraband Theraband Level (Elbow Flexion): Level 1 (Yellow) Elbow Extension: Strengthening;Both;20 reps;Seated;Theraband Theraband Level (Elbow Extension): Level 1 (Yellow) General Exercises - Lower Extremity Ankle Circles/Pumps: Strengthening;20  reps;Seated Long Arc Quad: Strengthening;Both;20 reps;Seated Hip ABduction/ADduction: Strengthening;Both;20 reps;Seated Hip Flexion/Marching: Strengthening;Both;20 reps;Seated    General Comments General comments (skin integrity, edema, etc.): SpO2 dropped to 75% on 6L HFNC, recovers back to >90% with pursed lip breathing and approx 2 min sitting.      Pertinent Vitals/Pain Pain Assessment: No/denies pain Faces Pain Scale: No hurt Pain Intervention(s): Monitored during session    Home Living                      Prior Function            PT Goals (current goals can now be found in the care plan section) Acute Rehab PT Goals Patient Stated Goal: "get strong enough to go home" Progress towards PT goals: Progressing toward goals    Frequency    Min 3X/week      PT Plan Current plan remains appropriate    Co-evaluation              AM-PAC PT "6 Clicks" Mobility   Outcome Measure  Help needed turning from your back to your side while in a flat bed without using bedrails?: A Little Help needed moving from lying on your back to sitting on the side of a flat bed without using bedrails?: A Little Help needed moving to and from a bed to a chair (including a wheelchair)?: A Little Help needed standing up from a chair using your arms (e.g., wheelchair or bedside chair)?: A Little Help needed to walk in hospital room?: A Lot Help needed climbing 3-5 steps with a railing? : Total 6 Click Score: 15    End of Session Equipment Utilized During Treatment: Oxygen Activity Tolerance: Patient tolerated treatment well Patient left: in chair;with call bell/phone within reach Nurse Communication: Mobility status PT Visit Diagnosis: Difficulty in walking, not elsewhere classified (R26.2)     Time: 2947-6546 PT Time Calculation (min) (ACUTE ONLY): 56 min  Charges:  $Gait Training: 53-67 mins $Therapeutic Activity: 8-22 mins                     [place]  Office [phone]    [name] 11/15/2018, 5:53 PM

## 2018-11-15 NOTE — Progress Notes (Signed)
Physical Therapy Treatment Patient Details Name: Christina OuchMaria Reynoso Clark MRN: [id number] DOB: [birth year] Today's Date: 11/15/2018    History of Present Illness 68 year old Spanish-speaking female with past medical history of morbid obesity, hypertension and depression  presenting to the emergency room 10/10/18 with 1 week of fever, cough and shortness of breath in the setting of known COVID-19 contacts.  Patient found to be COVID positive as well as hypoxic with bilateral infiltrates on chest x-ray.  Requiring oxygen support of high flow    PT Comments    Patient on 8 L HFNC for transfers to [facility] then back to recliner. Patient" SaO2 on ear  Is 95%, Patient sats dropping to 71 % with the activity. Patient recovers back to >90% within 2 minutes.   Follow Up Recommendations  CIR     Equipment Recommendations  Rolling walker with 5" wheels    Recommendations for Other Services Rehab consult     Precautions / Restrictions Precautions Precautions: Fall Precaution Comments: HFNC desats with minimal activity Restrictions Weight Bearing Restrictions: No    Mobility  Bed Mobility               General bed mobility comments: OOB in recliner  Transfers Overall transfer level: Needs assistance Equipment used: 1 person hand held assist Transfers: Stand Pivot Transfers Sit to Stand: Min guard Stand pivot transfers: Min assist       General transfer comment: min A for balance, moves quickly, [facility] then to [facility]  Ambulation/Gait                 Stairs             Wheelchair Mobility    Modified Rankin (Stroke Patients Only)       Balance Overall balance assessment: Needs assistance Sitting-balance support: No upper extremity supported;Feet supported Sitting balance-Leahy Scale: Good     Standing balance support: Single extremity supported;During functional activity Standing balance-Leahy Scale: Fair Standing balance comment: reliant on  support for dynamic movement                            Cognition Arousal/Alertness: Awake/alert Behavior During Therapy: WFL for tasks assessed/performed Overall Cognitive Status: Within Functional Limits for tasks assessed                                 General Comments: Motivated to participate in therapy. [name] on phone, patient much less anxious about mobilizing (although still enjoys eating a peppermint prior to transfers/mobility) and does not need as much time to recover      Exercises General Exercises - Upper Extremity Shoulder Flexion: Strengthening;Both;Theraband;10 reps Theraband Level (Shoulder Flexion): Level 1 (Yellow) Shoulder Extension: AROM;Strengthening;Both;Seated;10 reps Shoulder ABduction: Strengthening;Seated;Theraband;10 reps Theraband Level (Shoulder Abduction): Level 1 (Yellow) Shoulder Horizontal ABduction: Strengthening;Both;Seated;10 reps;Theraband Theraband Level (Shoulder Horizontal Abduction): Level 1 (Yellow) Shoulder Horizontal ADduction: Strengthening;Both;Seated;10 reps;Theraband Theraband Level (Shoulder Horizontal Adduction): Level 1 (Yellow) Elbow Flexion: Strengthening;Both;20 reps;Seated;Theraband Theraband Level (Elbow Flexion): Level 1 (Yellow) Elbow Extension: Strengthening;Both;20 reps;Seated;Theraband Theraband Level (Elbow Extension): Level 1 (Yellow) General Exercises - Lower Extremity Ankle Circles/Pumps: Strengthening;20 reps;Seated Long Arc Quad: Strengthening;Both;20 reps;Seated Hip ABduction/ADduction: Strengthening;Both;20 reps;Seated Hip Flexion/Marching: Strengthening;Both;20 reps;Seated    General Comments General comments (skin integrity, edema, etc.): SpO2 dropped to 75% on 6L HFNC, recovers back to >90% with pursed lip breathing and approx 2 min sitting.  Pertinent Vitals/Pain Pain Assessment: No/denies pain Faces Pain Scale: No hurt Pain Intervention(s): Monitored  during session    Home Living                      Prior Function            PT Goals (current goals can now be found in the care plan section) Acute Rehab PT Goals Patient Stated Goal: "get strong enough to go home" Progress towards PT goals: Progressing toward goals    Frequency    Min 3X/week      PT Plan Current plan remains appropriate    Co-evaluation              AM-PAC PT "6 Clicks" Mobility   Outcome Measure  Help needed turning from your back to your side while in a flat bed without using bedrails?: A Little Help needed moving from lying on your back to sitting on the side of a flat bed without using bedrails?: A Little Help needed moving to and from a bed to a chair (including a wheelchair)?: A Little Help needed standing up from a chair using your arms (e.g., wheelchair or bedside chair)?: A Little Help needed to walk in hospital room?: A Lot Help needed climbing 3-5 steps with a railing? : Total 6 Click Score: 15    End of Session Equipment Utilized During Treatment: Oxygen Activity Tolerance: Patient tolerated treatment well Patient left: in chair;with call bell/phone within reach Nurse Communication: Mobility status PT Visit Diagnosis: Difficulty in walking, not elsewhere classified (R26.2)     Time: 1048-1100 PT Time Calculation (min) (ACUTE ONLY): 12 min  Charges:  $Therapeutic Activity: 8-22 mins                     [place] Pager [phone] Office [phone]    [name] 11/15/2018, 5:46 PM

## 2018-11-15 NOTE — Progress Notes (Signed)
Ok to change solumedrol to about equivalent dose of oral prednisone per Dr. Aileen Fass.  Solumedrol 5.2mg >>5mg  prednisone.  Onnie Boer, PharmD, BCIDP, AAHIVP, CPP Infectious Disease Pharmacist 11/15/2018 8:11 AM

## 2018-11-15 NOTE — Progress Notes (Signed)
Occupational Therapy Treatment Patient Details Name: Christina Clark MRN: [id number] DOB: 1951-04-21 Today's Date: 11/15/2018    History of present illness 68 year old Spanish-speaking female with past medical history of morbid obesity, hypertension and depression  presenting to the emergency room 10/10/18 with 1 week of fever, cough and shortness of breath in the setting of known COVID-19 contacts.  Patient found to be COVID positive as well as hypoxic with bilateral infiltrates on chest x-ray.  Requiring oxygen support of high flow   OT comments  Pt progressing towards OT goals this session. Pt remains motivated to be as independent as possible and return to independent level of function. TOday she was able [facility] with min A for balance, peri care for front and back in standing, and focus on seated exercises with theraband. Pt continues to desaturate to mid 70's with activity. She is able to bring SpO2 back up to at or greater than 90% with pursed lip breathing and seated rest break within 2 min. Today she was on 6LO2 via HFNC. OT will continue to follow acutely and she will continue to require CIR level therapy.    Follow Up Recommendations  CIR;Supervision/Assistance - 24 hour    Equipment Recommendations  3 in 1 bedside commode    Recommendations for Other Services Rehab consult    Precautions / Restrictions Precautions Precautions: Fall Precaution Comments: HFNC 6L Restrictions Weight Bearing Restrictions: No       Mobility Bed Mobility               General bed mobility comments: OOB in recliner  Transfers Overall transfer level: Needs assistance Equipment used: 1 person hand held assist Transfers: Stand Pivot Transfers   Stand pivot transfers: Min assist       General transfer comment: min A for balance, moves quickly    Balance Overall balance assessment: Needs assistance Sitting-balance support: No upper extremity supported;Feet  supported Sitting balance-Leahy Scale: Good     Standing balance support: Single extremity supported;During functional activity Standing balance-Leahy Scale: Fair Standing balance comment: reliant on support for dynamic movement                           ADL either performed or assessed with clinical judgement   ADL Overall ADL's : Needs assistance/impaired     Grooming: Wash/dry hands;Wash/dry face;Set up;Sitting Grooming Details (indicate cue type and reason): in recliner after toilet transfer                 Toilet Transfer: BSC;Stand-pivot;Minimal assistance Toilet Transfer Details (indicate cue type and reason): min A for balance, continues to move quickly despite cues to slow pace Toileting- ArchitectClothing Manipulation and Hygiene: Min guard;Sit to/from stand Toileting - Clothing Manipulation Details (indicate cue type and reason): [facility], good hand placement             Vision       Perception     Praxis      Cognition Arousal/Alertness: Awake/alert Behavior During Therapy: WFL for tasks assessed/performed Overall Cognitive Status: Within Functional Limits for tasks assessed                                 General Comments: Motivated to participate in therapy. [name] on phone, patient much less anxious about mobilizing (although still enjoys eating a peppermint prior to transfers/mobility) and does not need  as much time to recover        Exercises Exercises: General Upper Extremity General Exercises - Upper Extremity Shoulder Flexion: Strengthening;Both;Theraband;10 reps Theraband Level (Shoulder Flexion): Level 1 (Yellow) Shoulder Extension: AROM;Strengthening;Both;Seated;10 reps Shoulder ABduction: Strengthening;Seated;Theraband;10 reps Theraband Level (Shoulder Abduction): Level 1 (Yellow) Shoulder Horizontal ABduction: Strengthening;Both;Seated;10 reps;Theraband Theraband Level (Shoulder Horizontal Abduction):  Level 1 (Yellow) Shoulder Horizontal ADduction: Strengthening;Both;Seated;10 reps;Theraband Theraband Level (Shoulder Horizontal Adduction): Level 1 (Yellow) Elbow Flexion: Strengthening;Both;20 reps;Seated;Theraband Theraband Level (Elbow Flexion): Level 1 (Yellow) Elbow Extension: Strengthening;Both;20 reps;Seated;Theraband Theraband Level (Elbow Extension): Level 1 (Yellow) General Exercises - Lower Extremity Ankle Circles/Pumps: Strengthening;20 reps;Seated Long Arc Quad: Strengthening;Both;20 reps;Seated Hip ABduction/ADduction: Strengthening;Both;20 reps;Seated Hip Flexion/Marching: Strengthening;Both;20 reps;Seated   Shoulder Instructions       General Comments SpO2 dropped to 75% on 6L HFNC, recovers back to >90% with pursed lip breathing and approx 2 min sitting.    Pertinent Vitals/ Pain       Pain Assessment: No/denies pain Faces Pain Scale: No hurt Pain Intervention(s): Monitored during session  Home Living                                          Prior Functioning/Environment              Frequency  Min 3X/week        Progress Toward Goals  OT Goals(current goals can now be found in the care plan section)  Progress towards OT goals: Progressing toward goals  Acute Rehab OT Goals Patient Stated Goal: "get strong enough to go home" OT Goal Formulation: With patient/family Time For Goal Achievement: 11/19/18 Potential to Achieve Goals: Good  Plan Discharge plan remains appropriate    Co-evaluation                 AM-PAC OT "6 Clicks" Daily Activity     Outcome Measure   Help from another person eating meals?: None Help from another person taking care of personal grooming?: A Little Help from another person toileting, which includes using toliet, bedpan, or urinal?: A Little Help from another person bathing (including washing, rinsing, drying)?: A Lot Help from another person to put on and taking off regular upper body  clothing?: A Little Help from another person to put on and taking off regular lower body clothing?: A Lot 6 Click Score: 17    End of Session Equipment Utilized During Treatment: Oxygen(6L HFNC)  OT Visit Diagnosis: Unsteadiness on feet (R26.81);Muscle weakness (generalized) (M62.81)   Activity Tolerance Patient tolerated treatment well   Patient Left in chair;with call bell/phone within reach   Nurse Communication Mobility status        Time: 0539-7673 OT Time Calculation (min): 22 min  Charges: OT General Charges $OT Visit: 1 Visit OT Treatments $Therapeutic Activity: 8-22 mins  Hulda Humphrey OTR/L Acute Rehabilitation Services Pager: [phone] Office: [place] 11/15/2018, 4:58 PM

## 2018-11-16 ENCOUNTER — Inpatient Hospital Stay (HOSPITAL_COMMUNITY): Payer: Medicaid Other

## 2018-11-16 LAB — BASIC METABOLIC PANEL
Anion gap: 12 (ref 5–15)
BUN: 23 mg/dL (ref 8–23)
CO2: 26 mmol/L (ref 22–32)
Calcium: 8.7 mg/dL — ABNORMAL LOW (ref 8.9–10.3)
Chloride: 101 mmol/L (ref 98–111)
Creatinine, Ser: 0.73 mg/dL (ref 0.44–1.00)
GFR calc Af Amer: >60 mL/min (ref >60)
GFR calc non Af Amer: >60 mL/min (ref >60)
Glucose, Bld: 72 mg/dL (ref 70–99)
Potassium: 3.8 mmol/L (ref 3.5–5.1)
Sodium: 139 mmol/L (ref 135–145)

## 2018-11-16 LAB — GLUCOSE, CAPILLARY
Glucose-Capillary: 88 mg/dL (ref 70–99)
Glucose-Capillary: 118 mg/dL — ABNORMAL HIGH (ref 70–99)
Glucose-Capillary: 168 mg/dL — ABNORMAL HIGH (ref 70–99)
Glucose-Capillary: 143 mg/dL — ABNORMAL HIGH (ref 70–99)

## 2018-11-16 MED ORDER — PREDNISONE 1 MG PO TABS
1.0000 mg | ORAL_TABLET | Freq: Every day | ORAL | Status: AC
  Administered 2018-11-16: 1 mg via ORAL
  Filled 2018-11-16 (×2): qty 1

## 2018-11-16 NOTE — Progress Notes (Signed)
Explained to pt that she needs to lay on her belly in order to facilitate her lungs improving. Pt states she cannot. Pt asked to lay on her side if she can't lay on her belly. Pt states she can lay on her side. Will continue to encourage prone position.

## 2018-11-16 NOTE — Progress Notes (Signed)
Physical Therapy Treatment Patient Details Name: Christina Clark MRN: [id number] DOB: 10-12-1950 Today's Date: 11/16/2018    History of Present Illness 68 year old Spanish-speaking female with past medical history of morbid obesity, hypertension and depression  presenting to the emergency room 10/10/18 with 1 week of fever, cough and shortness of breath in the setting of known COVID-19 contacts.  Patient found to be COVID positive as well as hypoxic with bilateral infiltrates on chest x-ray.  Requiring oxygen support of high flow    PT Comments    The patient ambulated x 14' x 2 on 4 L HFNC. SaO2 probe on left ear reading 95% at rest and a Nelcor probe placed on forehead, reading 88% at rest..  THere is ~ 6 -8 point descrepancy between the 2 sensors. Patient's lowest reading on forehead was 68% after  Second walk., Ear probe reading 75%. The patient SaO2 gradually returns to high 80's on forehead and 955 on ear. Continue to mobilize with decreasing supplemental O2 as tolerated. The patient is very  Motivated  To participate and eager to progress mobility.  Follow Up Recommendations  CIR     Equipment Recommendations  Rolling walker with 5" wheels    Recommendations for Other Services Rehab consult     Precautions / Restrictions Precautions Precautions: Fall Precaution Comments: HFNC 4 L today desats with minimal activity    Mobility  Bed Mobility               General bed mobility comments: OOB in recliner  Transfers Overall transfer level: Needs assistance Equipment used: Rolling walker (2 wheeled) Transfers: Sit to/from Stand Sit to Stand: Min guard Stand pivot transfers: Min guard       General transfer comment: stood and transferred to [facility] then to Recliner  Ambulation/Gait Ambulation/Gait assistance: Min assist;+2 physical assistance Gait Distance (Feet): 14 Feet(x 2) Assistive device: Rolling walker (2 wheeled) Gait Pattern/deviations: Step-through  pattern Gait velocity: decr   General Gait Details: encouraged slow speed and concentrate on  breathing. Patient on 4 L HFNC.   Stairs             Wheelchair Mobility    Modified Rankin (Stroke Patients Only)       Balance     Sitting balance-Leahy Scale: Good     Standing balance support: Single extremity supported Standing balance-Leahy Scale: Fair                              Cognition Arousal/Alertness: Awake/alert Behavior During Therapy: WFL for tasks assessed/performed Overall Cognitive Status: Within Functional Limits for tasks assessed                                 General Comments: Motivated to participate in therapy. Granddaughter Eyvonne Mechanic) on phone, patient much less anxious about mobilizing (although still enjoys eating a peppermint prior to transfers/mobility) and does not need as much time to recover      Exercises Other Exercises Other Exercises: pedalled bike x 1 minute x 2.    General Comments        Pertinent Vitals/Pain Pain Assessment: No/denies pain    Home Living                      Prior Function            PT Goals (current goals can  now be found in the care plan section) Progress towards PT goals: Progressing toward goals    Frequency    Min 3X/week      PT Plan Current plan remains appropriate    Co-evaluation              AM-PAC PT "6 Clicks" Mobility   Outcome Measure  Help needed turning from your back to your side while in a flat bed without using bedrails?: A Little Help needed moving from lying on your back to sitting on the side of a flat bed without using bedrails?: A Little Help needed moving to and from a bed to a chair (including a wheelchair)?: A Little Help needed standing up from a chair using your arms (e.g., wheelchair or bedside chair)?: A Little Help needed to walk in hospital room?: A Little Help needed climbing 3-5 steps with a railing? : A Lot 6  Click Score: 17    End of Session Equipment Utilized During Treatment: Oxygen Activity Tolerance: Patient tolerated treatment well Patient left: in chair;with call bell/phone within reach;with nursing/sitter in room Nurse Communication: Mobility status PT Visit Diagnosis: Difficulty in walking, not elsewhere classified (R26.2)     Time: [id number] PT Time Calculation (min) (ACUTE ONLY): 38 min  Charges:  $Gait Training: 38-52 mins                    Blanchard KelchKaren Almond Fitzgibbon PT Acute Rehabilitation Services Pager [phone] Office [phone]    Rada HayHill, Soua Caltagirone [name] 11/16/2018, 4:45 PM

## 2018-11-16 NOTE — Progress Notes (Addendum)
Physical Therapy Treatment Patient Details Name: Christina Clark MRN: [id number] DOB: October 07, 1950 Today's Date: 11/16/2018    History of Present Illness 68 year old Spanish-speaking female with past medical history of morbid obesity, hypertension and depression  presenting to the emergency room 10/10/18 with 1 week of fever, cough and shortness of breath in the setting of known COVID-19 contacts.  Patient found to be COVID positive as well as hypoxic with bilateral infiltrates on chest x-ray.  Requiring oxygen support of high flow    PT Comments    The patient permed LE exercises pedaling a restorator for 1 minute x 2 with 4 minute rest break .SaO2 probe on left  Ear  Reading 95 % at rest, 77% lowest during recovery, RR 34. Patient is  Very cheerful, cutting up with therapist.  patient is certainly improved. Will check SaO2 with forehead probe next visit when she ambulates.sign   Follow Up Recommendations  CIR     Equipment Recommendations  Rolling walker with 5" wheels    Recommendations for Other Services Rehab consult     Precautions / Restrictions Precautions Precaution Comments: HFNC 4 L today desats with minimal activity down into 60's but recovers within  2 minutes   Mobility  Bed Mobility               General bed mobility comments: OOB in recliner  Transfers Overall transfer level: Needs assistance   Transfers: Stand Pivot Transfers Sit to Stand: Min guard         General transfer comment: stands and transfers to [facility] then to recliner  Ambulation/Gait                 Stairs             Wheelchair Mobility    Modified Rankin (Stroke Patients Only)       Balance     Sitting balance-Leahy Scale: Good     Standing balance support: Single extremity supported Standing balance-Leahy Scale: Fair                              Cognition Arousal/Alertness: Awake/alert Behavior During Therapy: WFL for tasks  assessed/performed                                   General Comments: Motivated to participate in therapy. Granddaughter Eyvonne Mechanic) on phone, patient much less anxious about mobilizing (although still enjoys eating a peppermint prior to transfers/mobility) and does not need as much time to recover      Exercises Other Exercises Other Exercises: pedalled bike x 1 minute x 2.    General Comments        Pertinent Vitals/Pain Pain Assessment: No/denies pain    Home Living                      Prior Function            PT Goals (current goals can now be found in the care plan section) Progress towards PT goals: Progressing toward goals    Frequency    Min 3X/week      PT Plan Current plan remains appropriate    Co-evaluation              AM-PAC PT "6 Clicks" Mobility   Outcome Measure  Help needed turning from your back to your  side while in a flat bed without using bedrails?: A Little Help needed moving from lying on your back to sitting on the side of a flat bed without using bedrails?: A Little Help needed moving to and from a bed to a chair (including a wheelchair)?: A Little Help needed standing up from a chair using your arms (e.g., wheelchair or bedside chair)?: A Little Help needed to walk in hospital room?: A Lot Help needed climbing 3-5 steps with a railing? : Total 6 Click Score: 15    End of Session Equipment Utilized During Treatment: Oxygen Activity Tolerance: Patient tolerated treatment well Patient left: in chair;with call bell/phone within reach;with nursing/sitter in room Nurse Communication: Mobility status PT Visit Diagnosis: Difficulty in walking, not elsewhere classified (R26.2)     Time: [id number] PT Time Calculation (min) (ACUTE ONLY): 20min  Charges:  $Therapeutic Exercise: 8-22 mins                      Blanchard KelchKaren Anikah Hogge PT Acute Rehabilitation Services [phone] Office [phone]    Rada HayHill, Savon Cobbs  [name] 11/16/2018, 4:09 PM

## 2018-11-16 NOTE — Progress Notes (Signed)
Physical Therapy Treatment Patient Details Name: Christina Clark MRN: [id number] DOB: 08-09-1950 Today's Date: 11/16/2018    History of Present Illness 68 year old Spanish-speaking female with past medical history of morbid obesity, hypertension and depression  presenting to the emergency room 10/10/18 with 1 week of fever, cough and shortness of breath in the setting of known COVID-19 contacts.  Patient found to be COVID positive as well as hypoxic with bilateral infiltrates on chest x-ray.  Requiring oxygen support of high flow    PT Comments    [facility] then [facility] then to a WC to go to CT. Check back later to ambulate.  Follow Up Recommendations  CIR     Equipment Recommendations  Rolling walker with 5" wheels    Recommendations for Other Services Rehab consult     Precautions / Restrictions Precautions Precautions: Fall Precaution Comments: HFNC 4 L today desats with minimal activity    Mobility  Bed Mobility               General bed mobility comments: OOB in recliner  Transfers Overall transfer level: Needs assistance Equipment used: HHA Transfers: Sit to/from Stand Sit to Stand: Min guard Stand pivot transfers: Min guard       General transfer comment[facility]  Ambulation/Gait Ambulation/Gait assistance: Min assist;+2 physical assistance Gait Distance (Feet): 6 Feet Assistive device: 2 person hand held assist Gait Pattern/deviations: Step-through pattern Gait velocity: decr   General Gait Details: [facility]. patient moves slowly. Sats dropped to 82%,   Stairs             Wheelchair Mobility    Modified Rankin (Stroke Patients Only)       Balance     Sitting balance-Leahy Scale: Good     Standing balance support: Single extremity supported Standing balance-Leahy Scale: Fair                              Cognition Arousal/Alertness:  Awake/alert Behavior During Therapy: WFL for tasks assessed/performed Overall Cognitive Status: Within Functional Limits for tasks assessed                                 General Comments: Motivated to participate in therapy. [name] on phone, patient much less anxious about mobilizing (although still enjoys eating a peppermint prior to transfers/mobility) and does not need as much time to recover      Exercises O   General Comments        Pertinent Vitals/Pain Pain Assessment: No/denies pain    Home Living                      Prior Function            PT Goals (current goals can now be found in the care plan section) Progress towards PT goals: Progressing toward goals    Frequency    Min 3X/week      PT Plan Current plan remains appropriate    Co-evaluation              AM-PAC PT "6 Clicks" Mobility   Outcome Measure  Help needed turning from your back to your side while in a flat bed without using bedrails?: A Little Help needed moving from lying on your back to  sitting on the side of a flat bed without using bedrails?: A Little Help needed moving to and from a bed to a chair (including a wheelchair)?: A Little Help needed standing up from a chair using your arms (e.g., wheelchair or bedside chair)?: A Little Help needed to walk in hospital room?: A Lot Help needed climbing 3-5 steps with a railing? : Total 6 Click Score: 15    End of Session Equipment Utilized During Treatment: Oxygen Activity Tolerance: Patient tolerated treatment well Patient left: in chair;with call bell/phone within reach;with nursing/sitter in room Nurse Communication: Mobility status PT Visit Diagnosis: Difficulty in walking, not elsewhere classified (R26.2)     Time: 2952-8413 PT Time Calculation (min) (ACUTE ONLY):18 min  Charges:  Therapeutic activity [place] Pager  [phone] Office [phone]    [name] 11/16/2018, 4:34 PM

## 2018-11-16 NOTE — Progress Notes (Signed)
[facility] PROGRESS NOTE    Progress Note  Christina Clark  [id number] DOB: November 26, 1950 DOA: 10/10/2018 PCP: Patient, No Pcp Per     Brief Narrative:   Christina Clark is an 68 y.o. female past medical history of morbid obesity, hypertension and depression presents with acute respiratory failure due to COVID-19 pneumonia, he was treated with IV Remdesivir her Actemra and steroids.  Unfortunately his hospital course has been prolonged due to persistent hypoxia  Assessment/Plan:   Acute respiratory failure with hypoxia (HCC) due to COVID-19 viral infection: Still remain dependence on high level of oxygen supplementation. She completed a course of IV Remdesivir, Actemra and steroids. She still requiring more than 4 L of oxygen to keep saturation above 91%. She relates her breathing is slowly improving, continue to keep the patient prone for at least 16 hours a day. She continues to require high flow oxygen will check a CT of the chest.  Paroxysmal supraventricular tachycardia: Noted on telemetry on 11/11/2018 asymptomatic, now in sinus rhythm.. Try to keep potassium greater than 4 magnesium greater than 2.  Elevated LFTs likely due to SARS-CoV-2 infection: They have been improving will have to follow-up with PCP as an outpatient.  Acute on chronic diastolic heart failure: Appears to be euvolemic continue to keep net equal.  Essential hypertension: Continue antihypertensive medication ARB and hydrochlorothiazide.  Depression: Having significant amount of anxiety will hospitalize, continue low-dose Xanax continue home dose of Zoloft.  Uncontrolled diabetes mellitus type 2: With an A1c of 8.5.  She is currently on no medications at home. Discontinue long-acting insulin.  Leucocytosis: Likely due to steroids.  Hypokalemia: Continue supplement potassium to to keep it above 4.   DVT prophylaxis: lovenox Family Communication:none Disposition Plan/Barrier  to D/C: home once on < 4L of oxygen Code Status:     Code Status Orders  (From admission, onward)         Start     Ordered   10/26/18 1102  Limited resuscitation (code)  Continuous    Question Answer Comment  In the event of cardiac or respiratory ARREST: Initiate Code Blue, Call Rapid Response Yes   In the event of cardiac or respiratory ARREST: Perform CPR No   In the event of cardiac or respiratory ARREST: Perform Intubation/Mechanical Ventilation No   In the event of cardiac or respiratory ARREST: Use NIPPV/BiPAp only if indicated Yes   In the event of cardiac or respiratory ARREST: Administer ACLS medications if indicated Yes   In the event of cardiac or respiratory ARREST: Perform Defibrillation or Cardioversion if indicated Yes      10/26/18 1101        Code Status History    Date Active Date Inactive Code Status Order ID Comments User Context   10/11/2018 0031 10/26/2018 1101 Full Code [id number]  Hollice EspyKrishnan, Sendil K, MD Inpatient   Advance Care Planning Activity        IV Access:    Peripheral IV   Procedures and diagnostic studies:   No results found.   Medical Consultants:    None.  Anti-Infectives:   None  Subjective:    Christina Clark relates her breathing is unchanged compared to yesterday.  Objective:    Vitals:   11/15/18 1749 11/15/18 1750 11/15/18 2029 11/16/18 0414  BP:  (!) 104/47 (!) 121/49 (!) 127/58  Pulse:  81    Resp:  (!) 29    Temp: 98.4 F (36.9 C)  98.2 F (36.8 C) 98.4  F (36.9 C)  TempSrc: Oral  Oral Oral  SpO2:  97%    Weight:      Height:       SpO2: 97 % O2 Flow Rate (L/min): 4.5 L/min FiO2 (%): 100 %   Intake/Output Summary (Last 24 hours) at 11/16/2018 0746 Last data filed at 11/16/2018 0630 Gross per 24 hour  Intake 1210 ml  Output 200 ml  Net 1010 ml   Filed Weights   10/10/18 2300 11/03/18 0255  Weight: 108.7 kg 97.6 kg    Exam: General exam: In no acute distress. Respiratory system:  Good air movement and diffuse crackles bilaterally. Cardiovascular system: S1 & S2 heard, RRR. No JVD. Gastrointestinal system: Abdomen is nondistended, soft and nontender.  Central nervous system: Alert and oriented. No focal neurological deficits. Extremities: No pedal edema. Skin: No rashes, lesions or ulcers Psychiatry: Judgement and insight appear normal. Mood & affect appropriate.   Data Reviewed:    Labs: Basic Metabolic Panel: Recent Labs  Lab 11/12/18 0545 11/14/18 0500 11/15/18 0400  NA 141 141 141  K 3.9 3.5 4.2  CL 105 105 106  CO2 26 27 27   GLUCOSE 81 73 72  BUN 26* 22 17  CREATININE 0.78 0.72 0.56  CALCIUM 8.4* 8.7* 8.8*   GFR Estimated Creatinine Clearance: 78.5 mL/min (by C-G formula based on SCr of 0.56 mg/dL). Liver Function Tests: Recent Labs  Lab 11/12/18 0545 11/14/18 0500  AST 16 16  ALT 49* 51*  ALKPHOS 28* 32*  BILITOT 0.2* 0.4  PROT 5.6* 5.7*  ALBUMIN 2.7* 2.7*   No results for input(s): LIPASE, AMYLASE in the last 168 hours. No results for input(s): AMMONIA in the last 168 hours. Coagulation profile No results for input(s): INR, PROTIME in the last 168 hours. COVID-19 Labs  Recent Labs    11/14/18 0500  DDIMER 0.36  CRP <0.8    Lab Results  Component Value Date   SARSCOV2NAA POSITIVE (A) 10/10/2018    CBC: Recent Labs  Lab 11/12/18 0545 11/14/18 0500  WBC 11.5* 11.1*  NEUTROABS 7.7 7.7  HGB 11.1* 10.6*  HCT 35.0* 34.6*  MCV 99.4 99.4  PLT 324 292   Cardiac Enzymes: No results for input(s): CKTOTAL, CKMB, CKMBINDEX, TROPONINI in the last 168 hours. BNP (last 3 results) No results for input(s): PROBNP in the last 8760 hours. CBG: Recent Labs  Lab 11/14/18 2109 11/15/18 0742 11/15/18 1144 11/15/18 1618 11/15/18 2034  GLUCAP 142* 82 97 141* 108*   D-Dimer: Recent Labs    11/14/18 0500  DDIMER 0.36   Hgb A1c: No results for input(s): HGBA1C in the last 72 hours. Lipid Profile: No results for input(s):  CHOL, HDL, LDLCALC, TRIG, CHOLHDL, LDLDIRECT in the last 72 hours. Thyroid function studies: No results for input(s): TSH, T4TOTAL, T3FREE, THYROIDAB in the last 72 hours.  Invalid input(s): FREET3 Anemia work up: No results for input(s): VITAMINB12, FOLATE, FERRITIN, TIBC, IRON, RETICCTPCT in the last 72 hours. Sepsis Labs: Recent Labs  Lab 11/12/18 0545 11/14/18 0500  WBC 11.5* 11.1*   Microbiology No results found for this or any previous visit (from the past 240 hour(s)).   Medications:   . sodium chloride   Intravenous Once  . clonazepam  0.5 mg Oral BID  . enoxaparin (LOVENOX) injection  50 mg Subcutaneous Q24H  . insulin aspart  0-5 Units Subcutaneous QHS  . insulin aspart  0-9 Units Subcutaneous TID WC  . insulin aspart  3 Units Subcutaneous TID WC  .  insulin aspart  5 Units Subcutaneous TID WC  . insulin detemir  5 Units Subcutaneous QHS  . pantoprazole  40 mg Oral Daily  . polyethylene glycol  17 g Oral Daily  . predniSONE  5 mg Oral Q breakfast  . Ensure Max Protein  11 oz Oral BID  . sertraline  50 mg Oral Daily  . sodium chloride flush  10-40 mL Intracatheter Q12H   Continuous Infusions:   LOS: 37 days   Charlynne Cousins  [facility]  11/16/2018, 7:46 AM

## 2018-11-16 NOTE — Progress Notes (Signed)
Updated pt's granddaughter Eyvonne Mechanic, via telephone. Pt's daughter Jackye is at work and pt's granddaughter Eyvonne Mechanic is at home with her mother's cell phone. Also spoke to [facility] and gave Dr Olevia Bowens, the work phone and extension for [facility] as she had questions for the MD. Pt and her daughter both agree that it is ok to speak with Eyvonne Mechanic. Pt's daughter or granddaughter are usually on the pt's cell phone in the pt's room as she prefers her family to translate for her. Eyvonne Mechanic denies questions at this time.

## 2018-11-16 NOTE — Progress Notes (Signed)
Called and spoke with patient's daughter, Zelphia Cairo.  She was updated on patient's condition and all questions answered.  Earleen Reaper RN

## 2018-11-16 NOTE — Progress Notes (Deleted)
PT Cancellation Note  Patient Details Name: Christina Clark MRN: [id number] DOB: [birth year]   Cancelled Treatment:        [name] 11/16/2018, 4:08 PM

## 2018-11-17 LAB — BASIC METABOLIC PANEL
Anion gap: 8 (ref 5–15)
BUN: 20 mg/dL (ref 8–23)
CO2: 28 mmol/L (ref 22–32)
Calcium: 8.6 mg/dL — ABNORMAL LOW (ref 8.9–10.3)
Chloride: 104 mmol/L (ref 98–111)
Creatinine, Ser: 0.75 mg/dL (ref 0.44–1.00)
GFR calc Af Amer: >60 mL/min (ref >60)
GFR calc non Af Amer: >60 mL/min (ref >60)
Glucose, Bld: 106 mg/dL — ABNORMAL HIGH (ref 70–99)
Potassium: 3.7 mmol/L (ref 3.5–5.1)
Sodium: 140 mmol/L (ref 135–145)

## 2018-11-17 LAB — SEDIMENTATION RATE: Sed Rate: 48 mm/hr — ABNORMAL HIGH (ref 0–22)

## 2018-11-17 LAB — GLUCOSE, CAPILLARY
Glucose-Capillary: 106 mg/dL — ABNORMAL HIGH (ref 70–99)
Glucose-Capillary: 131 mg/dL — ABNORMAL HIGH (ref 70–99)
Glucose-Capillary: 197 mg/dL — ABNORMAL HIGH (ref 70–99)

## 2018-11-17 LAB — BRAIN NATRIURETIC PEPTIDE: B Natriuretic Peptide: 71.3 pg/mL (ref 0.0–100.0)

## 2018-11-17 MED ORDER — POTASSIUM CHLORIDE CRYS ER 20 MEQ PO TBCR
40.0000 meq | EXTENDED_RELEASE_TABLET | Freq: Two times a day (BID) | ORAL | Status: AC
  Administered 2018-11-17 (×2): 40 meq via ORAL
  Filled 2018-11-17 (×2): qty 2

## 2018-11-17 MED ORDER — POTASSIUM CHLORIDE CRYS ER 20 MEQ PO TBCR: 40.0000 meq | EXTENDED_RELEASE_TABLET | Freq: Two times a day (BID) | ORAL | Status: DC

## 2018-11-17 MED ORDER — FUROSEMIDE 10 MG/ML IJ SOLN: 40.0000 mg | Freq: Two times a day (BID) | INTRAMUSCULAR | Status: DC

## 2018-11-17 MED ORDER — FUROSEMIDE 10 MG/ML IJ SOLN
40.0000 mg | Freq: Once | INTRAMUSCULAR | Status: AC
  Administered 2018-11-17: 40 mg via INTRAVENOUS
  Filled 2018-11-17: qty 4

## 2018-11-17 NOTE — Progress Notes (Signed)
Updated patients daughter Irie in the room on patients cell phone. All questions answered and plan of care discussed

## 2018-11-17 NOTE — Progress Notes (Signed)
[facility] PROGRESS NOTE    Progress Note  Shawndra Clute  WUJ:[id number] DOB: 1950-05-15 DOA: 10/10/2018 PCP: Patient, No Pcp Per     Brief Narrative:   Christina Clark is an 68 y.o. female past medical history of morbid obesity, hypertension and depression presents with acute respiratory failure due to COVID-19 pneumonia, he was treated with IV Remdesivir her Actemra and steroids.  Unfortunately his hospital course has been prolonged due to persistent hypoxia  Assessment/Plan:   Acute respiratory failure with hypoxia ([place]) due to COVID-19 viral infection: She still remain on high oxygen demand. Cont Nasal cannula to keep saturations above 88%. She relates her breathing is slowly improving, continue to keep the patient prone for at least 16 hours a day. CT showed diffuse pulmonary infiltrates, her BNP was 73. Continue to monitor strict I's and O's daily weight. Her oxygen requirement slow to improve she is currently on 4 L of oxygen.  Paroxysmal supraventricular tachycardia: Noted on telemetry on 11/11/2018 asymptomatic, now in sinus rhythm.. Try to keep potassium greater than 4 magnesium greater than 2.  Elevated LFTs likely due to SARS-CoV-2 infection: They have been improving will have to follow-up with PCP as an outpatient.  Acute on chronic diastolic heart failure: Appears to be euvolemic continue to keep net equal. I's and O's poorly recorded check a BMP.  Essential hypertension: Continue antihypertensive medication ARB and hydrochlorothiazide.  Depression: Having significant amount of anxiety will hospitalize, continue low-dose Xanax continue home dose of Zoloft.  Uncontrolled diabetes mellitus type 2: With an A1c of 8.5.  She is currently on no medications at home. Discontinue long-acting insulin.  Leucocytosis: Likely due to steroids.  Hypokalemia: Supplement orally recheck in the morning.   DVT prophylaxis: lovenox Family  Communication:none Disposition Plan/Barrier to D/C: home once on < 4L of oxygen Code Status:     Code Status Orders  (From admission, onward)         Start     Ordered   10/26/18 1102  Limited resuscitation (code)  Continuous    Question Answer Comment  In the event of cardiac or respiratory ARREST: Initiate Code Blue, Call Rapid Response Yes   In the event of cardiac or respiratory ARREST: Perform CPR No   In the event of cardiac or respiratory ARREST: Perform Intubation/Mechanical Ventilation No   In the event of cardiac or respiratory ARREST: Use NIPPV/BiPAp only if indicated Yes   In the event of cardiac or respiratory ARREST: Administer ACLS medications if indicated Yes   In the event of cardiac or respiratory ARREST: Perform Defibrillation or Cardioversion if indicated Yes      10/26/18 1101        Code Status History    Date Active Date Inactive Code Status Order ID Comments User Context   10/11/2018 0031 10/26/2018 1101 Full Code [id number]  Annita Brod, MD Inpatient   Advance Care Planning Activity        IV Access:    Peripheral IV   Procedures and diagnostic studies:   Ct Chest Wo Contrast  Result Date: 11/16/2018 CLINICAL DATA:  Dyspnea, positive COVID-19 EXAM: CT CHEST WITHOUT CONTRAST TECHNIQUE: Multidetector CT imaging of the chest was performed following the standard protocol without IV contrast. COMPARISON:  11/04/2010 FINDINGS: Cardiovascular: Somewhat limited due to lack of IV contrast. Aortic calcifications are noted. The heart is at the upper limits of normal in size. No significant coronary calcifications are noted. Mediastinum/Nodes: Thoracic inlet is within normal limits. Scattered  small mediastinal nodes are seen. The esophagus is within normal limits. Lungs/Pleura: The lungs are well aerated bilaterally. There are persistent infiltrates identified throughout both lungs with some interstitial prominence likely related underlying edema. No  pleural effusion or pneumothorax is seen. The overall appearance is similar to that seen on prior chest x-ray. Upper Abdomen: Visualized upper abdomen shows no acute abnormality. Musculoskeletal: No chest wall mass or suspicious bone lesions identified. IMPRESSION: Diffuse bilateral pulmonary infiltrates with some interstitial edema similar to that seen on prior CT examination. This is consistent with the patient's given clinical history Aortic Atherosclerosis (ICD10-I70.0). Electronically Signed   By: Alcide CleverMark  Lukens M.D.   On: 11/16/2018 11:26     Medical Consultants:    None.  Anti-Infectives:   None  Subjective:    Iran OuchMaria Reynoso Tirado relates her breathing is unchanged compared to yesterday.  Objective:    Vitals:   11/17/18 0400 11/17/18 0500 11/17/18 0600 11/17/18 0604  BP:    (!) 108/56  Pulse: 75 75 82   Resp: (!) 24 (!) 25 (!) 30   Temp:    98.4 F (36.9 C)  TempSrc:    Oral  SpO2: 97% 97% 92%   Weight:      Height:       SpO2: 92 % O2 Flow Rate (L/min): 4 L/min FiO2 (%): 100 %   Intake/Output Summary (Last 24 hours) at 11/17/2018 0726 Last data filed at 11/17/2018 0600 Gross per 24 hour  Intake 240 ml  Output 100 ml  Net 140 ml   Filed Weights   10/10/18 2300 11/03/18 0255  Weight: 108.7 kg 97.6 kg    Exam: General exam: In no acute distress. Respiratory system: Good air movement and diffuse crackles bilaterally. Cardiovascular system: S1 & S2 heard, RRR. No JVD. Gastrointestinal system: Abdomen is nondistended, soft and nontender.  Central nervous system: Alert and oriented. No focal neurological deficits. Extremities: No pedal edema. Skin: No rashes, lesions or ulcers Psychiatry: Judgement and insight appear normal. Mood & affect appropriate.     Data Reviewed:    Labs: Basic Metabolic Panel: Recent Labs  Lab 11/12/18 0545 11/14/18 0500 11/15/18 0400 11/16/18 0500  NA 141 141 141 139  K 3.9 3.5 4.2 3.8  CL 105 105 106 101  CO2 26 27  27 26   GLUCOSE 81 73 72 72  BUN 26* 22 17 23   CREATININE 0.78 0.72 0.56 0.73  CALCIUM 8.4* 8.7* 8.8* 8.7*   GFR Estimated Creatinine Clearance: 78.5 mL/min (by C-G formula based on SCr of 0.73 mg/dL). Liver Function Tests: Recent Labs  Lab 11/12/18 0545 11/14/18 0500  AST 16 16  ALT 49* 51*  ALKPHOS 28* 32*  BILITOT 0.2* 0.4  PROT 5.6* 5.7*  ALBUMIN 2.7* 2.7*   No results for input(s): LIPASE, AMYLASE in the last 168 hours. No results for input(s): AMMONIA in the last 168 hours. Coagulation profile No results for input(s): INR, PROTIME in the last 168 hours. COVID-19 Labs  No results for input(s): DDIMER, FERRITIN, LDH, CRP in the last 72 hours.  Lab Results  Component Value Date   SARSCOV2NAA POSITIVE (A) 10/10/2018    CBC: Recent Labs  Lab 11/12/18 0545 11/14/18 0500  WBC 11.5* 11.1*  NEUTROABS 7.7 7.7  HGB 11.1* 10.6*  HCT 35.0* 34.6*  MCV 99.4 99.4  PLT 324 292   Cardiac Enzymes: No results for input(s): CKTOTAL, CKMB, CKMBINDEX, TROPONINI in the last 168 hours. BNP (last 3 results) No results for input(s):  PROBNP in the last 8760 hours. CBG: Recent Labs  Lab 11/15/18 2034 11/16/18 0749 11/16/18 1112 11/16/18 1653 11/16/18 2108  GLUCAP 108* 88 118* 168* 143*   D-Dimer: No results for input(s): DDIMER in the last 72 hours. Hgb A1c: No results for input(s): HGBA1C in the last 72 hours. Lipid Profile: No results for input(s): CHOL, HDL, LDLCALC, TRIG, CHOLHDL, LDLDIRECT in the last 72 hours. Thyroid function studies: No results for input(s): TSH, T4TOTAL, T3FREE, THYROIDAB in the last 72 hours.  Invalid input(s): FREET3 Anemia work up: No results for input(s): VITAMINB12, FOLATE, FERRITIN, TIBC, IRON, RETICCTPCT in the last 72 hours. Sepsis Labs: Recent Labs  Lab 11/12/18 0545 11/14/18 0500  WBC 11.5* 11.1*   Microbiology No results found for this or any previous visit (from the past 240 hour(s)).   Medications:   . sodium chloride    Intravenous Once  . clonazepam  0.5 mg Oral BID  . enoxaparin (LOVENOX) injection  50 mg Subcutaneous Q24H  . insulin aspart  0-5 Units Subcutaneous QHS  . insulin aspart  0-9 Units Subcutaneous TID WC  . insulin aspart  3 Units Subcutaneous TID WC  . pantoprazole  40 mg Oral Daily  . polyethylene glycol  17 g Oral Daily  . Ensure Max Protein  11 oz Oral BID  . sertraline  50 mg Oral Daily  . sodium chloride flush  10-40 mL Intracatheter Q12H   Continuous Infusions:   LOS: 38 days   Marinda ElkAbraham Feliz Ortiz  [facility]  11/17/2018, 7:26 AM

## 2018-11-17 NOTE — Progress Notes (Signed)
Updated pt's daughter Mirca via telephone in the room.

## 2018-11-17 NOTE — Progress Notes (Signed)
While giving HS medications orally, patient became SOB.  Her oxygen levels dropped into low 80s on 4 LPM.  Levels gradually came back up to greater than 85.  Will continue to monitor patient.  Earleen Reaper RN

## 2018-11-18 LAB — BASIC METABOLIC PANEL
Anion gap: 10 (ref 5–15)
BUN: 21 mg/dL (ref 8–23)
CO2: 27 mmol/L (ref 22–32)
Calcium: 8.6 mg/dL — ABNORMAL LOW (ref 8.9–10.3)
Chloride: 103 mmol/L (ref 98–111)
Creatinine, Ser: 0.77 mg/dL (ref 0.44–1.00)
GFR calc Af Amer: >60 mL/min (ref >60)
GFR calc non Af Amer: >60 mL/min (ref >60)
Glucose, Bld: 107 mg/dL — ABNORMAL HIGH (ref 70–99)
Potassium: 4.1 mmol/L (ref 3.5–5.1)
Sodium: 140 mmol/L (ref 135–145)

## 2018-11-18 LAB — GLUCOSE, CAPILLARY
Glucose-Capillary: 143 mg/dL — ABNORMAL HIGH (ref 70–99)
Glucose-Capillary: 109 mg/dL — ABNORMAL HIGH (ref 70–99)

## 2018-11-18 MED ORDER — HYDROCHLOROTHIAZIDE 25 MG PO TABS: 25.0000 mg | ORAL_TABLET | Freq: Every day | ORAL | Status: DC

## 2018-11-18 MED ORDER — LOSARTAN POTASSIUM 50 MG PO TABS: 50.0000 mg | ORAL_TABLET | Freq: Every day | ORAL | Status: DC

## 2018-11-18 MED ORDER — METFORMIN HCL 500 MG PO TABS: 500.0000 mg | ORAL_TABLET | Freq: Two times a day (BID) | ORAL | 11 refills | Status: DC

## 2018-11-18 NOTE — Discharge Instructions (Signed)
Respiracin con los labios fruncidos Pursed Lip Breathing La respiracin con los labios fruncidos es una tcnica para Paramedicaliviar la sensacin de falta de aire. Algunas enfermedades respiratorias crnicas, como la enfermedad pulmonar obstructiva crnica (EPOC) y el asma grave, pueden dificultar la eliminacin de todo el aire de los pulmones al Leisure centre managerespirar (exhalar). Esto puede hacer que en los pulmones se acumule aire que contiene menos oxgeno (aire atrapado). Esto implica que los pulmones se llenan con menos aire fresco cuando usted inspira (inhala). Como resultado, usted siente que le falta el aire. La respiracin con los labios fruncidos Exxon Mobil Corporationmantiene las vas respiratorias abiertas por ms tiempo cuando usted [place], y se vaca ms aire de los pulmones. Esto deja ms espacio para el aire fresco que ingresa cuando usted [place]. La respiracin con los labios fruncidos tambin puede lentificar la respiracin y ayudar al organismo a no esforzarse tanto para Industrial/product designerrespirar. Con el tiempo, es posible que la respiracin con los labios fruncidos lo ayude a estar ms activo fsicamente y a Radio producerhacer ms actividades. Cmo se realiza la respiracin con los labios fruncidos Sentir que le falta el aire puede hacerlo poner tenso y ansioso. Antes de comenzar este ejercicio de respiracin, tmese un minuto para relajar los hombros y American Expresscierre los ojos. Luego: 1. Comience el ejercicio cerrando la boca. 2. Inspire normalmente por la Darene Lamernariz. Puede hacerlo con su frecuencia respiratoria normal. Si siente que no obtiene suficiente aire, inspire lentamente mientras cuenta hasta 2o 3. 3. Arrugue (frunza) los labios como si fuera a silbar. 4. Tense levemente los msculos del abdomen o ejerza presin sobre el vientre para ayudar a Lexicographereliminar el aire. 5. Exhale lentamente a travs de los labios fruncidos. Tmese el doble de tiempo para exhalar de lo que demora en inhalar, como mnimo. 6. Asegrese de exhalar todo el aire, pero no realice una  exhalacin forzada. 7. Repita el ejercicio hasta que mejore la respiracin. Pregntele al mdico con qu frecuencia y por cunto tiempo debe hacer este ejercicio. Siga estas indicaciones en su casa:  Tome los medicamentos de venta libre y los recetados solamente como se lo haya indicado el mdico.  Reanude sus actividades normales segn lo indicado por el mdico. Pregntele al mdico qu actividades son seguras para usted.  No consuma ningn producto que contenga nicotina o tabaco, como cigarrillos y Administrator, Civil Servicecigarrillos electrnicos. Si necesita ayuda para dejar de fumar, consulte al mdico.  Concurra a todas las visitas de control como se lo haya indicado el mdico. Esto es importante. Comunquese con un mdico si:  La falta de aire empeora.  Tiene menos capacidad para hacer ejercicio o Agricultural engineerestar activo.  Tiene tos.  Tiene fiebre. Solicite ayuda de inmediato si:  Tiene problemas para respirar.  La falta de aire le impide que participe en [place] actividad. Resumen  La respiracin con los labios fruncidos es una tcnica que ayuda a eliminar el aire atrapado de los pulmones. Ayuda a los pulmones a obtener ms oxgeno y hace que el organismo deba esforzarse menos para Industrial/product designerrespirar.  La respiracin con los labios fruncidos le permite que, con el [place], sea ms [place].  Puede hacer la respiracin con los labios fruncidos por su cuenta en su casa.  Pregntele al mdico con qu frecuencia y por cunto tiempo debe hacer la respiracin con los labios fruncidos. Esta informacin no tiene Theme park managercomo fin reemplazar el consejo del mdico. Asegrese de hacerle al mdico cualquier pregunta que tenga. Document Released: 05/21/2010 Document Revised: 12/01/2016 Document Reviewed: 12/01/2016 Elsevier Patient Education  [place] con los labios fruncidos Pursed Lip Breathing La respiracin con los labios fruncidos es una tcnica para Public house manager la sensacin de falta de aire. Algunas enfermedades  respiratorias crnicas, como la enfermedad pulmonar obstructiva crnica (EPOC) y el asma grave, pueden dificultar la eliminacin de todo el aire de los pulmones al Art gallery manager (exhalar). Esto puede hacer que en los pulmones se acumule aire que contiene menos oxgeno (aire atrapado). Esto implica que los pulmones se llenan con menos aire fresco cuando usted inspira (inhala). Como resultado, usted siente que le falta el aire. La respiracin con los labios fruncidos [employer] vas respiratorias abiertas por ms tiempo cuando usted [place], y se vaca ms aire de los pulmones. Esto deja ms espacio para el aire fresco que ingresa cuando usted [place]. La respiracin con los labios fruncidos tambin puede lentificar la respiracin y ayudar al organismo a no esforzarse tanto para Ambulance person. Con el tiempo, es posible que la respiracin con los labios fruncidos lo ayude a estar ms activo fsicamente y a Field seismologist ms actividades. Cmo se realiza la respiracin con los labios fruncidos Sentir que le falta el aire puede hacerlo poner tenso y ansioso. Antes de comenzar este ejercicio de respiracin, tmese un minuto para relajar los hombros y [employer] ojos. Luego: 8. Comience el ejercicio cerrando la boca. 9. Inspire normalmente por la Doran Durand. Puede hacerlo con su frecuencia respiratoria normal. Si siente que no obtiene suficiente aire, inspire lentamente mientras cuenta hasta 2o 3. 10. Arrugue (frunza) los labios como si fuera a silbar. 11. Tense levemente los msculos del abdomen o ejerza presin sobre el vientre para ayudar a Paramedic. 12. Exhale lentamente a travs de los labios fruncidos. Tmese el doble de tiempo para exhalar de lo que demora en inhalar, como mnimo. 13. Asegrese de exhalar todo el aire, pero no realice una exhalacin forzada. 28. Repita el ejercicio hasta que mejore la respiracin. Pregntele al mdico con qu frecuencia y por cunto tiempo debe hacer este ejercicio. Siga estas  indicaciones en su casa:  Tome los medicamentos de venta libre y los recetados solamente como se lo haya indicado el mdico.  Reanude sus actividades normales segn lo indicado por el mdico. Pregntele al mdico qu actividades son seguras para usted.  No consuma ningn producto que contenga nicotina o tabaco, como cigarrillos y Psychologist, sport and exercise. Si necesita ayuda para dejar de fumar, consulte al mdico.  Concurra a todas las visitas de control como se lo haya indicado el mdico. Esto es importante. Comunquese con un mdico si:  La falta de aire empeora.  Tiene menos capacidad para hacer ejercicio o Dietitian.  Tiene tos.  Tiene fiebre. Solicite ayuda de inmediato si:  Tiene problemas para respirar.  La falta de aire le impide que participe en [place]. Resumen  La respiracin con los labios fruncidos es una tcnica que ayuda a eliminar el aire atrapado de los pulmones. Ayuda a los pulmones a obtener ms oxgeno y hace que el organismo deba esforzarse menos para Ambulance person.  La respiracin con los labios fruncidos le permite que, con el [place], sea ms [place].  Puede hacer la respiracin con los labios fruncidos por su cuenta en su casa.  Pregntele al mdico con qu frecuencia y por cunto tiempo debe hacer la respiracin con los labios fruncidos. Esta informacin no tiene Marine scientist el consejo del mdico. Asegrese de hacerle al mdico cualquier pregunta que tenga. Document Released: 05/21/2010 Document Revised: 12/01/2016 Document Reviewed: 12/01/2016 Elsevier  Patient Education  [employer].

## 2018-11-18 NOTE — Progress Notes (Signed)
Nurse reviewed discharge instructions with pt and her family.  Pt and family verbalized understanding of discharge instructions, follow up appointments and ne medication.  Discharge information provided to pt in spanish.  Pt discharged on 4L O2 via nasal cannula.  No concerns at time of discharge.

## 2018-11-18 NOTE — TOC Transition Note (Signed)
Transition of Care [facility]) - CM/SW Discharge Note Marvetta Gibbons RN,BSN Transitions of [place] coverage - RN Case Manager [phone]   Patient Details  Name: Clairissa Valvano MRN: [id number] Date of Birth: [birth year]  Transition of Care [facility]) CM/SW Contact:  Dawayne Patricia, RN Phone Number: 11/18/2018, 11:30 AM   Clinical Narrative:    Pt stable for transition home today, orders placed for home 02- will arrange with [employer]- call made to Learta Codding with [employer] for home 02 needs- pt is uninsured. Home 02 will be delivered to home by [employer] prior to transition home. Call also made to pt's daughter Megyn- explained home 02 process- will need to await delivery of home 02 equipment prior to pt leaving hospital- daughter voices understanding. Have notified [facility] for delivery of portable tank to hospital room along with pulse ox and thermometer prior to discharge. Noted appointment with [facility] for Aug. 10, will have CM f/u on 7/20 for any further PCP needs.  Reviewed meds- scripts generic low cost- no need for MATCH.    Final next level of care: Home/Self Care Barriers to Discharge: No Barriers Identified, Barriers Resolved   Patient Goals and CMS Choice Patient states their goals for this hospitalization and ongoing recovery are:: "to come home"(per daughter)   Choice offered to / list presented to : Adult Children  Discharge Placement  Home                     Discharge Plan and Services In-house Referral: Interpreting Services Discharge Planning Services: CM Consult Post Acute Care Choice: Durable Medical Equipment          DME Arranged: Oxygen DME Agency: [place] Date DME Agency Contacted: 11/18/18 Time DME Agency Contacted: [id number] Representative spoke with at DME Agency: Learta Codding HH Arranged: NA [place] Agency: NA        Social Determinants of Health ([place]) Interventions     Readmission Risk Interventions Readmission Risk Prevention Plan  11/18/2018 11/18/2018  Transportation Screening - Complete  PCP or Specialist Appt within 5-7 Days Complete Not Complete  Not Complete comments appointment made with [place] Screening - Complete  Medication Review (RN CM) - Complete

## 2018-11-18 NOTE — Discharge Summary (Signed)
Physician Discharge Summary  Christina Clark VFI:[id number] DOB: [birth year] DOA: 10/10/2018  PCP: Patient, No Pcp Per  Admit date: 10/10/2018 Discharge date: 11/18/2018  Admitted From: home Disposition:  Home  Recommendations for Outpatient Follow-up:  1. Follow up with PCP in 1-2 weeks 2. Please obtain BMP/CBC in one week   Home Health:Yes Equipment/Devices: Home on oxygen 4 L the next 2 to 3 weeks  Discharge Condition:Stable CODE STATUS:Limited code Diet recommendation: Heart Healthy  Brief/Interim Summary:  68 y.o. female past medical history of morbid obesity, hypertension and depression presents with acute respiratory failure due to COVID-19 pneumonia  Discharge Diagnoses:  Principal Problem:   Acute respiratory failure with hypoxia ([place]) Active Problems:   Hypertension   Depression   Acute respiratory disease due to COVID-19 virus   Morbid obesity with BMI of 40.0-44.9, adult ([place])   Diabetes mellitus type 2, uncontrolled ([place]) Acute respiratory failure with hypoxia due to COVID-19 viral infection: She was admitted to the hospital was prone for more than 16 hours a day, she was started on IV Remdesivir, steroids and 1 doses of Actemra. At one point during her hospitalization she required 15 L of oxygen high flow nasal cannula, she was hard to wean off the oxygen.  She remained 6 days on 4 L of oxygen here in the hospital, she will continue oxygen at home for the next 2 weeks at a minimum. CT of the chest on 11/16/2018 was done that showed diffuse pulmonary infiltrates, her BNP was 73.  Paroxysmal supraventricular tachycardia: Noted on telemetry and 11/11/2018 she remained asymptomatic she is currently in sinus rhythm.  Elevated LFTs likely due to SARS-CoV-2 infection: This resolved.  Acute on chronic diastolic heart failure: No changes made to her medication.  Essential hypertension: Her antibiotics medications were held on admission should continue to hold this  for a week after she gets discharged and resumed them as an outpatient.  Depression: No changes made to her medication.  Uncontrolled diabetes mellitus type 2: With an A1c of 8.5 which, she has required minimal use of sliding scale insulin. She was started on oral metformin which she will continue as an outpatient.  Leukocytosis: Likely due to steroids.  Hypokalemia: This was repleted orally now resolved.   Discharge Instructions  Discharge Instructions    Diet - low sodium heart healthy   Complete by: As directed    Increase activity slowly   Complete by: As directed      Allergies as of 11/18/2018      Reactions   Chocolate    Reports vertigo sx   Coffee Bean Extract [coffea Arabica]       Medication List    TAKE these medications   hydrochlorothiazide 25 MG tablet Commonly known as: HYDRODIURIL Take 1 tablet (25 mg total) by mouth daily. Start taking on: November 26, 2018 What changed: These instructions start on November 26, 2018. If you are unsure what to do until then, ask your doctor or other care provider.   losartan 50 MG tablet Commonly known as: COZAAR Take 1 tablet (50 mg total) by mouth daily. Start taking on: November 25, 2018 What changed: These instructions start on November 25, 2018. If you are unsure what to do until then, ask your doctor or other care provider.   sertraline 50 MG tablet Commonly known as: ZOLOFT Take 50 mg by mouth daily.      Follow-up [place] Follow up.  Why: You are scheduled for a telephone hospital follow up call on Monday, December 10, 2018 at 2:30pm with Bertram DenverZelda Fleming, NP, please be available for the call.  Contact information: [address] [place] [phone]         Allergies  Allergen Reactions  . Chocolate     Reports vertigo sx  . Coffee Bean Extract [Coffea Arabica]     Consultations:  PCCM   Procedures/Studies: Ct Chest Wo  Contrast  Result Date: 11/16/2018 CLINICAL DATA:  Dyspnea, positive COVID-19 EXAM: CT CHEST WITHOUT CONTRAST TECHNIQUE: Multidetector CT imaging of the chest was performed following the standard protocol without IV contrast. COMPARISON:  11/04/2010 FINDINGS: Cardiovascular: Somewhat limited due to lack of IV contrast. Aortic calcifications are noted. The heart is at the upper limits of normal in size. No significant coronary calcifications are noted. Mediastinum/Nodes: Thoracic inlet is within normal limits. Scattered small mediastinal nodes are seen. The esophagus is within normal limits. Lungs/Pleura: The lungs are well aerated bilaterally. There are persistent infiltrates identified throughout both lungs with some interstitial prominence likely related underlying edema. No pleural effusion or pneumothorax is seen. The overall appearance is similar to that seen on prior chest x-ray. Upper Abdomen: Visualized upper abdomen shows no acute abnormality. Musculoskeletal: No chest wall mass or suspicious bone lesions identified. IMPRESSION: Diffuse bilateral pulmonary infiltrates with some interstitial edema similar to that seen on prior CT examination. This is consistent with the patient's given clinical history Aortic Atherosclerosis (ICD10-I70.0). Electronically Signed   By: Alcide CleverMark  Lukens M.D.   On: 11/16/2018 11:26   Dg Chest Port 1 View  Result Date: 11/04/2018 CLINICAL DATA:  Shortness of breath.  Follow-up pneumonia. EXAM: PORTABLE CHEST 1 VIEW COMPARISON:  11/03/2018 FINDINGS: Widespread bilateral pulmonary infiltrates persist. No change. No dense consolidation, collapse or effusions. Heart size remains upper limits of normal. IMPRESSION: No change.  Persistent widespread pulmonary infiltrates. Electronically Signed   By: Paulina FusiMark  Shogry M.D.   On: 11/04/2018 08:07   Dg Chest Port 1 View  Result Date: 11/03/2018 CLINICAL DATA:  Tachycardia.  Coronavirus infection. EXAM: PORTABLE CHEST 1 VIEW COMPARISON:   10/26/2018 FINDINGS: Widespread patchy and hazy pulmonary infiltrates persist, but are improved compared to the last exam. No worsening or new findings. No effusions. IMPRESSION: Persistent widespread patchy and hazy pulmonary infiltrates consistent with viral pneumonia. Infiltrates are less dense than were seen on the prior exam. Electronically Signed   By: Paulina FusiMark  Shogry M.D.   On: 11/03/2018 11:43   Dg Chest Port 1 View  Result Date: 10/26/2018 CLINICAL DATA:  Acute respiratory disease.  Hypoxia. EXAM: PORTABLE CHEST 1 VIEW COMPARISON:  10/24/2018 FINDINGS: Diffuse bilateral interstitial and alveolar airspace opacities. No significant interval change compared with the prior exam. No pleural effusion or pneumothorax. Stable cardiomediastinal silhouette. No aggressive osseous lesion. IMPRESSION: Stable diffuse bilateral interstitial and alveolar airspace disease most consistent with multilobar pneumonia including atypical pneumonia. Electronically Signed   By: Elige KoHetal  Patel   On: 10/26/2018 09:39   Dg Chest Port 1 View  Result Date: 10/24/2018 CLINICAL DATA:  Order for SOB  Hx of HTN EXAM: PORTABLE CHEST - 1 VIEW COMPARISON:  10/22/2018 FINDINGS: Progression of bilateral diffuse coarse airspace opacities with relative sparing of the apices. Heart size and mediastinal contours are within normal limits. Aortic Atherosclerosis (ICD10-170.0). No effusion. Visualized bones unremarkable. IMPRESSION: Worsening bilateral infiltrates or edema. Electronically Signed   By: Corlis Leak  Hassell M.D.   On: 10/24/2018 08:55   Dg  Chest Port 1 View  Result Date: 10/22/2018 CLINICAL DATA:  Pneumonia. EXAM: PORTABLE CHEST 1 VIEW COMPARISON:  Radiograph of October 10, 2018. FINDINGS: The heart size and mediastinal contours are within normal limits. No pneumothorax or pleural effusion is noted. Slightly improved bilateral lung opacities are noted consistent with multifocal pneumonia. The visualized skeletal structures are unremarkable.  IMPRESSION: Slightly improved multifocal pneumonia. Electronically Signed   By: Lupita RaiderJames  Green Jr M.D.   On: 10/22/2018 11:32     Subjective: No complaints feels great.  Discharge Exam: Vitals:   11/18/18 0600 11/18/18 0825  BP:  (!) 122/56  Pulse: (!) 111 93  Resp: (!) 30 (!) 29  Temp:  98.1 F (36.7 C)  SpO2: (!) 78% (!) 88%   Vitals:   11/18/18 0400 11/18/18 0500 11/18/18 0600 11/18/18 0825  BP:    (!) 122/56  Pulse: 83 83 (!) 111 93  Resp: (!) 26 (!) 26 (!) 30 (!) 29  Temp:    98.1 F (36.7 C)  TempSrc:      SpO2: 97% 96% (!) 78% (!) 88%  Weight:      Height:        General: Pt is alert, awake, not in acute distress Cardiovascular: RRR, S1/S2 +, no rubs, no gallops Respiratory: CTA bilaterally, no wheezing, no rhonchi Abdominal: Soft, NT, ND, bowel sounds + Extremities: no edema, no cyanosis    The results of significant diagnostics from this hospitalization (including imaging, microbiology, ancillary and laboratory) are listed below for reference.     Microbiology: No results found for this or any previous visit (from the past 240 hour(s)).   Labs: BNP (last 3 results) Recent Labs    11/05/18 0500 11/06/18 0602 11/17/18 0640  BNP 66.0 72.1 71.3   Basic Metabolic Panel: Recent Labs  Lab 11/14/18 0500 11/15/18 0400 11/16/18 0500 11/17/18 0640 11/18/18 0410  NA 141 141 139 140 140  K 3.5 4.2 3.8 3.7 4.1  CL 105 106 101 104 103  CO2 27 27 26 28 27   GLUCOSE 73 72 72 106* 107*  BUN 22 17 23 20 21   CREATININE 0.72 0.56 0.73 0.75 0.77  CALCIUM 8.7* 8.8* 8.7* 8.6* 8.6*   Liver Function Tests: Recent Labs  Lab 11/12/18 0545 11/14/18 0500  AST 16 16  ALT 49* 51*  ALKPHOS 28* 32*  BILITOT 0.2* 0.4  PROT 5.6* 5.7*  ALBUMIN 2.7* 2.7*   No results for input(s): LIPASE, AMYLASE in the last 168 hours. No results for input(s): AMMONIA in the last 168 hours. CBC: Recent Labs  Lab 11/12/18 0545 11/14/18 0500  WBC 11.5* 11.1*  NEUTROABS 7.7 7.7   HGB 11.1* 10.6*  HCT 35.0* 34.6*  MCV 99.4 99.4  PLT 324 292   Cardiac Enzymes: No results for input(s): CKTOTAL, CKMB, CKMBINDEX, TROPONINI in the last 168 hours. BNP: Invalid input(s): POCBNP CBG: Recent Labs  Lab 11/16/18 2108 11/17/18 0758 11/17/18 1616 11/17/18 2227 11/18/18 0829  GLUCAP 143* 106* 131* 197* 143*   D-Dimer No results for input(s): DDIMER in the last 72 hours. Hgb A1c No results for input(s): HGBA1C in the last 72 hours. Lipid Profile No results for input(s): CHOL, HDL, LDLCALC, TRIG, CHOLHDL, LDLDIRECT in the last 72 hours. Thyroid function studies No results for input(s): TSH, T4TOTAL, T3FREE, THYROIDAB in the last 72 hours.  Invalid input(s): FREET3 Anemia work up No results for input(s): VITAMINB12, FOLATE, FERRITIN, TIBC, IRON, RETICCTPCT in the last 72 hours. Urinalysis    Component  Value Date/Time   COLORURINE YELLOW 10/24/2018 0721   APPEARANCEUR CLEAR 10/24/2018 0721   LABSPEC 1.025 10/24/2018 0721   PHURINE 5.0 10/24/2018 0721   GLUCOSEU NEGATIVE 10/24/2018 0721   HGBUR NEGATIVE 10/24/2018 0721   BILIRUBINUR NEGATIVE 10/24/2018 0721   KETONESUR NEGATIVE 10/24/2018 0721   PROTEINUR NEGATIVE 10/24/2018 0721   NITRITE NEGATIVE 10/24/2018 0721   LEUKOCYTESUR NEGATIVE 10/24/2018 0721   Sepsis Labs Invalid input(s): PROCALCITONIN,  WBC,  LACTICIDVEN Microbiology No results found for this or any previous visit (from the past 240 hour(s)).   Time coordinating discharge: Over 40 minutes  SIGNED:   Marinda ElkAbraham Feliz Ortiz, MD  [facility] 11/18/2018, 10:47 AM Pager   If 7PM-7AM, please contact night-coverage [email or web address] Password [email or web address]

## 2018-11-18 NOTE — Progress Notes (Signed)
SATURATION QUALIFICATIONS: (This note is used to comply with regulatory documentation for home oxygen)  Patient Saturations on Room Air at Rest = n/a%  Patient Saturations on Room Air while Ambulating = n/a%  Patient Saturations on 4 Liters of oxygen while Ambulating =74%  Please briefly explain why patient needs home oxygen: long term covid, home O2 needed. 4L in hospital, does not tolerate RA.

## 2018-11-19 LAB — GLUCOSE, CAPILLARY: Glucose-Capillary: 192 mg/dL — ABNORMAL HIGH (ref 70–99)

## 2018-12-10 ENCOUNTER — Other Ambulatory Visit: Payer: Self-pay

## 2018-12-10 ENCOUNTER — Encounter: Payer: Self-pay | Admitting: Nurse Practitioner

## 2018-12-10 ENCOUNTER — Ambulatory Visit: Payer: Self-pay | Attending: Nurse Practitioner | Admitting: Nurse Practitioner

## 2018-12-10 DIAGNOSIS — Z7689 Persons encountering health services in other specified circumstances: Secondary | ICD-10-CM

## 2018-12-10 DIAGNOSIS — IMO0002 Reserved for concepts with insufficient information to code with codable children: Secondary | ICD-10-CM

## 2018-12-10 DIAGNOSIS — E1165 Type 2 diabetes mellitus with hyperglycemia: Secondary | ICD-10-CM

## 2018-12-10 DIAGNOSIS — Z8616 Personal history of COVID-19: Secondary | ICD-10-CM

## 2018-12-10 DIAGNOSIS — E118 Type 2 diabetes mellitus with unspecified complications: Secondary | ICD-10-CM

## 2018-12-10 DIAGNOSIS — Z8619 Personal history of other infectious and parasitic diseases: Secondary | ICD-10-CM

## 2018-12-10 DIAGNOSIS — I10 Essential (primary) hypertension: Secondary | ICD-10-CM

## 2018-12-10 NOTE — Progress Notes (Signed)
Virtual Visit via Telephone Note Due to national recommendations of social distancing due to [place] 19, telehealth visit is felt to be most appropriate for this patient at this time.  I discussed the limitations, risks, security and privacy concerns of performing an evaluation and management service by telephone and the availability of in person appointments. I also discussed with the patient that there may be a patient responsible charge related to this service. The patient expressed understanding and agreed to proceed.    I connected with Christina Clark on 12/10/18  at   2:30 PM EDT  EDT by telephone and verified that I am speaking with the correct person using two identifiers.   Consent I discussed the limitations, risks, security and privacy concerns of performing an evaluation and management service by telephone and the availability of in person appointments. I also discussed with the patient that there may be a patient responsible charge related to this service. The patient expressed understanding and agreed to proceed.   Location of Patient: Private Residence   Location of Provider: [place] participating in Telemedicine visit: Geryl Rankins FNP-BC [place]  Telephone interpreter was used to communicate and translate  directly with patient for the entire encounter including providing detailed patient instructions.    History of Present Illness: Telemedicine visit for: Establish Care/HFU  has a past medical history of Depression, Diabetes mellitus without complication ([place]), and Hypertension.  Admitted to the hospital 10-10-2018 through 11-18-2018 with Acute respiratory failure with hypoxia due to COVID-19 viral infection: She was admitted to the hospital was prone for more than 16 hours a day, she was started on IV Remdesivir, steroids and 1 doses of Actemra. At one point during her hospitalization she  required 15 L of oxygen high flow nasal cannula, she was hard to wean off the oxygen.  She remained 6 days on 4 L of oxygen here in the hospital, she will continue oxygen at home for the next 2 weeks at a minimum. CT of the chest on 11/16/2018 was done that showed diffuse pulmonary infiltrates, her BNP was 73.  Today she continues on O2 4L [place] with sats 97% obtained during televisit. Still with shortness of breath with minimal exertion. States sats in the 70s even with O2 4L.  Denies fevers or cough at this time. She has not started to  wean herself down from  4L at this time. Denies any other symptoms aside from shortness of breath.    DM TYPE 2 Chronic. Diagnosed last year. Taking metformin 500 mg BID as prescribed. Monitoring twice per day. Fasting this morning 117. Postprandial from yesterday 114. Taking metformin 500 mg BID as prescribed.  Lab Results  Component Value Date   HGBA1C 8.1 (H) 10/11/2018   Essential Hypertension Chronic. Controlled. Taking HCTZ 25 mg daily, losartan 50 mg daily. She does not monitor her blood pressure at home. Denies chest pain,  palpitations, lightheadedness, dizziness, headaches or BLE edema.  BP Readings from Last 3 Encounters:  11/18/18 (!) 122/56  10/10/18 (!) 119/53    Depression Well controlled with zoloft 50 mg daily. Denies any thoughts of self harm.  Depression screen PHQ 2/9 12/10/2018  Decreased Interest 1  Down, Depressed, Hopeless 1  PHQ - 2 Score 2  Altered sleeping 2  Tired, decreased energy 2  Change in appetite 1  Feeling bad or failure about yourself  0  Trouble concentrating 0  Moving slowly  or fidgety/restless 0  Suicidal thoughts 0  PHQ-9 Score 7   Past Medical History:  Diagnosis Date  . Depression    After husbands death, takes medication  . Diabetes mellitus without complication (HCC)   . Hypertension     History reviewed. No pertinent surgical history.  Family History  Problem Relation Age of Onset  . Diabetes Neg  Hx   . Hypertension Neg Hx     Social History   Socioeconomic History  . Marital status: Widowed    Spouse name: Not on file  . Number of children: Not on file  . Years of education: Not on file  . Highest education level: Not on file  Occupational History  . Not on file  Social Needs  . Financial resource strain: Not hard at all  . Food insecurity    Worry: Never true    Inability: Never true  . Transportation needs    Medical: No    Non-medical: No  Tobacco Use  . Smoking status: Never Smoker  . Smokeless tobacco: Never Used  Substance and Sexual Activity  . Alcohol use: Not Currently    Frequency: Never  . Drug use: Never  . Sexual activity: Not Currently  Lifestyle  . Physical activity    Days per week: 1 day    Minutes per session: 10 min  . Stress: Not at all  Relationships  . Social connections    Talks on phone: More than three times a week    Gets together: More than three times a week    Attends religious service: 1 to 4 times per year    Active member of club or organization: No    Attends meetings of clubs or organizations: Never    Relationship status: Widowed  Other Topics Concern  . Not on file  Social History Narrative  . Not on file     Observations/Objective: Awake, alert and oriented x 3   Review of Systems  Constitutional: Negative for fever, malaise/fatigue and weight loss.  HENT: Negative.  Negative for nosebleeds.   Eyes: Negative.  Negative for blurred vision, double vision and photophobia.  Respiratory: Positive for shortness of breath. Negative for cough, hemoptysis, sputum production and wheezing.   Cardiovascular: Negative.  Negative for chest pain, palpitations and leg swelling.  Gastrointestinal: Negative.  Negative for heartburn, nausea and vomiting.  Musculoskeletal: Negative.  Negative for myalgias.  Neurological: Negative.  Negative for dizziness, focal weakness, seizures and headaches.  Psychiatric/Behavioral: Positive  for depression. Negative for suicidal ideas.    Assessment and Plan: Aivy was seen today for hospitalization follow-up.  Diagnoses and all orders for this visit:  Encounter to establish care F/U next month for labs and A1c  History of 2019 novel coronavirus disease (COVID-19) -     Cancel: DG Chest 2 View; Future -     DG Chest 2 View; Future  Essential hypertension -     CBC; Future -     CMP14+EGFR; Future Continue all antihypertensives as prescribed.  Remember to bring in your blood pressure log with you for your follow up appointment.  DASH/Mediterranean Diets are healthier choices for HTN.    Diabetes mellitus type 2, uncontrolled, with complications (HCC) -     CBC; Future -     Lipid panel; Future Continue blood sugar control as discussed in office today, low carbohydrate diet, and regular physical exercise as tolerated, 150 minutes per week (30 min each day, 5 days per week,   or 50 min 3 days per week). Keep blood sugar logs with fasting goal of 90-130 mg/dl, post prandial (after you eat) less than 180.  For Hypoglycemia: BS <60 and Hyperglycemia BS >400; contact the clinic ASAP. Annual eye exams and foot exams are recommended.      Follow Up Instructions Return in about 4 weeks (around 01/07/2019).     I discussed the assessment and treatment plan with the patient. The patient was provided an opportunity to ask questions and all were answered. The patient agreed with the plan and demonstrated an understanding of the instructions.   The patient was advised to call back or seek an in-person evaluation if the symptoms worsen or if the condition fails to improve as anticipated.  I provided 21 minutes of non-face-to-face time during this encounter including median intraservice time, reviewing previous notes, labs, imaging, medications and explaining diagnosis and management.  Gildardo Pounds, FNP-BC

## 2018-12-11 ENCOUNTER — Ambulatory Visit
Admission: RE | Admit: 2018-12-11 | Discharge: 2018-12-11 | Disposition: A | Discharge status: Home / Self Care | Payer: Self-pay | Source: Ambulatory Visit | Attending: Nurse Practitioner | Admitting: Nurse Practitioner

## 2018-12-11 DIAGNOSIS — Z8619 Personal history of other infectious and parasitic diseases: Secondary | ICD-10-CM | POA: Insufficient documentation

## 2018-12-11 DIAGNOSIS — Z8616 Personal history of COVID-19: Secondary | ICD-10-CM

## 2018-12-12 ENCOUNTER — Other Ambulatory Visit: Payer: Self-pay | Admitting: Nurse Practitioner

## 2018-12-12 DIAGNOSIS — U071 COVID-19: Secondary | ICD-10-CM

## 2018-12-12 DIAGNOSIS — J1282 Pneumonia due to coronavirus disease 2019: Secondary | ICD-10-CM

## 2018-12-16 ENCOUNTER — Encounter: Payer: Self-pay | Admitting: Nurse Practitioner

## 2019-01-11 ENCOUNTER — Other Ambulatory Visit: Payer: Self-pay

## 2019-01-11 ENCOUNTER — Ambulatory Visit (HOSPITAL_BASED_OUTPATIENT_CLINIC_OR_DEPARTMENT_OTHER): Payer: Self-pay | Admitting: Pharmacist

## 2019-01-11 ENCOUNTER — Ambulatory Visit: Payer: Self-pay | Attending: Nurse Practitioner | Admitting: Nurse Practitioner

## 2019-01-11 ENCOUNTER — Encounter: Payer: Self-pay | Admitting: Nurse Practitioner

## 2019-01-11 VITALS — BP 135/71 | HR 77 | Temp 99.1°F | Ht 64.0 in | Wt 213.6 lb

## 2019-01-11 DIAGNOSIS — F3341 Major depressive disorder, recurrent, in partial remission: Secondary | ICD-10-CM

## 2019-01-11 DIAGNOSIS — IMO0002 Reserved for concepts with insufficient information to code with codable children: Secondary | ICD-10-CM

## 2019-01-11 DIAGNOSIS — J1282 Pneumonia due to coronavirus disease 2019: Secondary | ICD-10-CM

## 2019-01-11 DIAGNOSIS — U071 COVID-19: Secondary | ICD-10-CM

## 2019-01-11 DIAGNOSIS — Z1211 Encounter for screening for malignant neoplasm of colon: Secondary | ICD-10-CM

## 2019-01-11 DIAGNOSIS — E1165 Type 2 diabetes mellitus with hyperglycemia: Secondary | ICD-10-CM

## 2019-01-11 DIAGNOSIS — I10 Essential (primary) hypertension: Secondary | ICD-10-CM

## 2019-01-11 DIAGNOSIS — E118 Type 2 diabetes mellitus with unspecified complications: Secondary | ICD-10-CM

## 2019-01-11 DIAGNOSIS — Z23 Encounter for immunization: Secondary | ICD-10-CM

## 2019-01-11 LAB — GLUCOSE, POCT (MANUAL RESULT ENTRY): POC Glucose: 135 mg/dl — AB (ref 70–99)

## 2019-01-11 LAB — POCT GLYCOSYLATED HEMOGLOBIN (HGB A1C): Hemoglobin A1C: 6.7 % — AB (ref 4.0–5.6)

## 2019-01-11 MED ORDER — LOSARTAN POTASSIUM 50 MG PO TABS: 50.0000 mg | ORAL_TABLET | Freq: Every day | ORAL | 1 refills | Status: DC

## 2019-01-11 MED ORDER — SERTRALINE HCL 50 MG PO TABS: 50.0000 mg | ORAL_TABLET | Freq: Every day | ORAL | 2 refills | Status: DC

## 2019-01-11 MED ORDER — METFORMIN HCL 500 MG PO TABS: 500.0000 mg | ORAL_TABLET | Freq: Two times a day (BID) | ORAL | 1 refills | Status: DC

## 2019-01-11 MED ORDER — TRUE METRIX METER W/DEVICE KIT: PACK | 0 refills | Status: DC

## 2019-01-11 MED ORDER — HYDROCHLOROTHIAZIDE 25 MG PO TABS: 25.0000 mg | ORAL_TABLET | Freq: Every day | ORAL | 1 refills | Status: DC

## 2019-01-11 MED ORDER — TRUE METRIX BLOOD GLUCOSE TEST VI STRP: ORAL_STRIP | 12 refills | Status: DC

## 2019-01-11 MED ORDER — TRUEPLUS LANCETS 28G MISC: 3 refills | Status: DC

## 2019-01-11 NOTE — Progress Notes (Signed)
Assessment & Plan:  Christina Clark was seen today for follow-up.  Diagnoses and all orders for this visit:  Diabetes mellitus type 2, uncontrolled, with complications (HCC) -     Glucose (CBG) -     HgB A1c -     metFORMIN (GLUCOPHAGE) 500 MG tablet; Take 1 tablet (500 mg total) by mouth 2 (two) times daily with a meal. -     Lipid panel -     CBC -     TRUEplus Lancets 28G MISC; Use as instructed. Check blood glucose level by fingerstick once per day.  Please mail to patient -     Blood Glucose Monitoring Suppl (TRUE METRIX METER) w/Device KIT; Use as instructed. Check blood glucose level by fingerstick once per day.  Please mail to patient -     glucose blood (TRUE METRIX BLOOD GLUCOSE TEST) test strip; Use as instructed. Check blood glucose level by fingerstick once per day.  Please mail to patient Continue blood sugar control as discussed in office today, low carbohydrate diet, and regular physical exercise as tolerated, 150 minutes per week (30 min each day, 5 days per week, or 50 min 3 days per week). Keep blood sugar logs with fasting goal of 90-130 mg/dl, post prandial (after you eat) less than 180.  For Hypoglycemia: BS <60 and Hyperglycemia BS >400; contact the clinic ASAP. Annual eye exams and foot exams are recommended.   Essential hypertension -     hydrochlorothiazide (HYDRODIURIL) 25 MG tablet; Take 1 tablet (25 mg total) by mouth daily. -     losartan (COZAAR) 50 MG tablet; Take 1 tablet (50 mg total) by mouth daily. -     CMP14+EGFR -     CBC Continue all antihypertensives as prescribed.  Remember to bring in your blood pressure log with you for your follow up appointment.  DASH/Mediterranean Diets are healthier choices for HTN.    Pneumonia due to COVID-19 virus -     DG Chest 2 View; Future  Colon cancer screening -     Fecal occult blood, imunochemical; Future -     Fecal occult blood, imunochemical  Recurrent major depressive disorder, in partial remission (HCC) -      sertraline (ZOLOFT) 50 MG tablet; Take 1 tablet (50 mg total) by mouth daily.  Depression screen [facility] 2/9 01/11/2019 12/10/2018  Decreased Interest 1 1  Down, Depressed, Hopeless 1 1  PHQ - 2 Score 2 2  Altered sleeping 3 2  Tired, decreased energy 2 2  Change in appetite 0 1  Feeling bad or failure about yourself  0 0  Trouble concentrating 1 0  Moving slowly or fidgety/restless 1 0  Suicidal thoughts 0 0  PHQ-9 Score 9 7     Patient has been counseled on age-appropriate routine health concerns for screening and prevention. These are reviewed and up-to-date. Referrals have been placed accordingly. Immunizations are up-to-date or declined.    Subjective:   Chief Complaint  Patient presents with  . Follow-up   HPI Christina Clark 68 y.o. female presents to office today for follow up to DM. VRI was used to communicate directly with patient for the entire encounter including providing detailed patient instructions.  Patient is also accompanied by her daughter today.  Is gradually improving since being diagnosed with pneumonia and COVID-19 October 2018  Referred to be [facility] today for mammogram and bone density scheduling.  Type 2 DM Well controlled and improved.  Down from 8.1 to  6.7 today.  Compliance taking metformin 500 mg twice daily.  She denies any hypo-or hyperglycemic symptoms.  She has not been monitoring her blood glucose levels and she does not have a meter.  Will send meter and supplies today to pharmacy.  She is overdue for eye exam. Patient has been advised to apply for financial assistance and schedule to see our financial counselor.  Lab Results  Component Value Date   HGBA1C 6.7 (A) 01/11/2019    Essential Hypertension Blood pressures well controlled today.  She is currently taking losartan 50 mg daily, hydrochlorothiazide 25 mg daily. Denies chest pain, shortness of breath, palpitations, lightheadedness, dizziness, headaches or BLE edema.  BP Readings from Last  3 Encounters:  01/11/19 135/71  11/18/18 (!) 122/56  10/10/18 (!) 119/53    COVID She stopped using home oxygen daily over a week ago.  Only uses prn.  O2 levels drop when she is taking longer showers and with increased exertion.  Sats can drop as low as 75-85% on RA. With recovery of sats within a few seconds after placing on O2 2 L.  Will need follow-up x-ray next month.  Since her hospital visit she has been using a cane as needed for mobility.    Review of Systems  Constitutional: Negative for fever, malaise/fatigue and weight loss.  HENT: Negative.  Negative for nosebleeds.   Eyes: Negative.  Negative for blurred vision, double vision and photophobia.  Respiratory: Positive for shortness of breath. Negative for cough, hemoptysis, sputum production and wheezing.   Cardiovascular: Negative.  Negative for chest pain, palpitations and leg swelling.  Gastrointestinal: Negative.  Negative for heartburn, nausea and vomiting.  Musculoskeletal: Negative.  Negative for myalgias.  Neurological: Positive for weakness. Negative for dizziness, focal weakness, seizures and headaches.  Psychiatric/Behavioral: Positive for depression (Controlled with Zoloft.). Negative for suicidal ideas.    Past Medical History:  Diagnosis Date  . Depression    After husbands death, takes medication  . Diabetes mellitus without complication ([place])   . Hypertension     History reviewed. No pertinent surgical history.  Family History  Problem Relation Age of Onset  . Diabetes Neg Hx   . Hypertension Neg Hx     Social History Reviewed with no changes to be made today.   Outpatient Medications Prior to Visit  Medication Sig Dispense Refill  . hydrochlorothiazide (HYDRODIURIL) 25 MG tablet Take 1 tablet (25 mg total) by mouth daily.    Marland Kitchen losartan (COZAAR) 50 MG tablet Take 1 tablet (50 mg total) by mouth daily.    . metFORMIN (GLUCOPHAGE) 500 MG tablet Take 1 tablet (500 mg total) by mouth 2 (two) times  daily with a meal. 60 tablet 11  . sertraline (ZOLOFT) 50 MG tablet Take 50 mg by mouth daily.     No facility-administered medications prior to visit.     Allergies  Allergen Reactions  . Chocolate     Reports vertigo sx  . Coffee Bean Extract [Coffea Arabica]        Objective:    BP 135/71 (BP Location: Left Arm, Patient Position: Sitting, Cuff Size: Large)   Pulse 77   Temp 99.1 F (37.3 C) (Oral)   Ht '5\' 4"'$  (1.626 m)   Wt 213 lb 9.6 oz (96.9 kg)   SpO2 92%   BMI 36.66 kg/m  Wt Readings from Last 3 Encounters:  01/11/19 213 lb 9.6 oz (96.9 kg)  11/03/18 215 lb 2.7 oz (97.6 kg)  10/10/18 240  lb 4.8 oz (109 kg)    Physical Exam Vitals signs and nursing note reviewed.  Constitutional:      Appearance: She is well-developed.  HENT:     Head: Normocephalic and atraumatic.  Neck:     Musculoskeletal: Normal range of motion.  Cardiovascular:     Rate and Rhythm: Normal rate and regular rhythm.     Heart sounds: Normal heart sounds. No murmur. No friction rub. No gallop.   Pulmonary:     Effort: Pulmonary effort is normal. No tachypnea or respiratory distress.     Breath sounds: Normal breath sounds. No decreased breath sounds, wheezing, rhonchi or rales.  Chest:     Chest wall: No tenderness.  Abdominal:     General: Bowel sounds are normal.     Palpations: Abdomen is soft.  Musculoskeletal: Normal range of motion.  Skin:    General: Skin is warm and dry.  Neurological:     Mental Status: She is alert and oriented to person, place, and time.     Sensory: Sensation is intact.     Coordination: Coordination normal.     Gait: Gait abnormal.  Psychiatric:        Behavior: Behavior normal. Behavior is cooperative.        Thought Content: Thought content normal.        Judgment: Judgment normal.        Patient has been counseled extensively about nutrition and exercise as well as the importance of adherence with medications and regular follow-up. The patient  was given clear instructions to go to ER or return to medical center if symptoms don't improve, worsen or new problems develop. The patient verbalized understanding.   Follow-up: Return in about 3 months (around 04/12/2019) for Follow up with me in 3 months or sooner if needed also give FA paperwork.   Gildardo Pounds, FNP-BC [facility] [place], [place]   01/11/2019, 7:16 PM

## 2019-01-11 NOTE — Progress Notes (Signed)
Patient presents for vaccination against influenza and S. pneumo per orders of Zelda. Consent given. Counseling provided. No contraindications exists. Vaccine administered without incident.   

## 2019-01-11 NOTE — Patient Instructions (Signed)
Preguntas frecuentes sobre el COVID-19  COVID-19 Frequently Asked Questions  El COVID-19 (enfermedad por coronavirus) es una infección causada por una gran familia de virus. Algunos virus causan enfermedades en las personas y otros causan enfermedades en animales tales como los camellos, los gatos y los murciélagos. En algunos casos, los virus que causan enfermedades en los animales pueden transmitirse a los seres humanos.  ¿De dónde provino el coronavirus?  En diciembre de 2019, [place] le informó a la Organización Mundial de la Salud (OMS) acerca de varios casos de enfermedad pulmonar (enfermedad respiratoria humana). Estos casos estaban vinculados con un mercado abierto de frutos de mar y ganado en la ciudad de [place]. El vínculo con el mercado de ganado y mariscos sugiere que el virus puede haberse propagado de los animales a los humanos. Sin embargo, desde ese primer brote en diciembre, también se ha demostrado que el virus se contagia de una persona a otra.  ¿Cuál es el nombre de la enfermedad y del virus?  Nombre de la enfermedad  Al principio, esta enfermedad se llamó nuevo coronavirus. Esto se debe a que los científicos determinaron que la enfermedad era causada por un nuevo virus respiratorio. La Organización Mundial de la Salud (OMS) ahora ha dado a la enfermedad el nombre de COVID-19, o enfermedad por coronavirus.  Nombre del virus  El virus causante de la enfermedad se conoce como coronavirus de tipo 2 causante del síndrome respiratorio agudo grave (SARS-CoV-2).  Más información sobre el nombre de la enfermedad y el virus  Organización Mundial de la Salud (OMS): www.who.int/emergencies/diseases/novel-coronavirus-2019/technical-guidance/naming-the-coronavirus-disease-(covid-2019)-and-the-virus-that-causes-it  ¿Quiénes están en riesgo de sufrir complicaciones debido a la enfermedad por coronavirus?  Algunas personas pueden tener un riesgo más alto de tener complicaciones debido a la enfermedad por  coronavirus. Entre ellas se encuentran los adultos mayores y las personas que tienen enfermedades crónicas, como enfermedad cardíaca, diabetes y enfermedad pulmonar.  Si tiene un riesgo más alto de tener complicaciones, tome estas precauciones adicionales:  · Evitar el contacto cercano con personas que estén enfermas o que tengan fiebre o tos. Permanecer al menos a una distancia de 3 a 6 pies (1-2 m) de las otras personas, si es posible.  · Lavarse las manos regularmente con agua y jabón durante al menos 20 segundos.  · Evitar tocarse la cara, la boca, la nariz y los ojos.  · Tener a mano los suministros en su casa, como alimentos, medicamentos y productos de limpieza.  · Permanecer en su casa todo lo que sea posible.  · Evitar las reuniones sociales y los viajes.  ¿Cómo se transmite la enfermedad causada por el coronavirus?  El virus que causa la enfermedad por coronavirus se transmite fácilmente de una persona a otra (es contagioso). También hay casos de enfermedad de transmisión comunitaria. Esto significa que la enfermedad se ha propagado a:  · Personas que no tienen contacto conocido con otras personas infectadas.  · Personas que no han viajado a zonas donde hay casos conocidos.  Aparentemente, se transmite de una persona a otra a través de las gotitas que se despiden al toser o al estornudar.  ¿Puedo contraer al virus al tocar superficies u objetos?  Todavía hay mucho que no se conoce acerca del virus que causa la enfermedad por coronavirus. Los científicos basan gran parte de la información en lo que saben sobre virus similares, por ejemplo:  · En general, los virus no sobreviven en superficies durante mucho tiempo. Necesitan un cuerpo humano (huésped) para sobrevivir.  · Es más probable que   el virus se contagie por contacto cercano con personas que están enfermas (contacto directo), por ejemplo:  ? Al estrechar las manos o abrazarse.  ? Al inhalar las gotitas respiratorias que se desplazan por el aire. Esto  puede ocurrir cuando una persona infectada tose o estornuda sobre o cerca de otras personas.  · Es menos probable que el virus se propague cuando una persona toca una superficie o un objeto sobre el que está el virus (contacto indirecto). El virus puede ingresar al cuerpo si la persona toca una superficie o un objeto y luego se toca la cara, los ojos, la nariz o la boca.  ¿Una persona puede contagiar el virus sin tener síntomas de la enfermedad?  Puede ser posible que el virus se contagie antes de que la persona tenga síntomas de la enfermedad, pero muy probablemente esta no sea la principal forma en que el virus se está propagando. Es más probable que el virus se propague al estar en contacto directo con personas que están enfermas e inhalar las gotas respiratorias que una persona enferma despide al toser o estornudar.  ¿Cuáles son los síntomas de la enfermedad causada por el coronavirus?  Los síntomas varían de una persona a otra y pueden variar de leves a graves. Entre los síntomas, se pueden incluir los siguientes:  · Fiebre.  · Tos.  · Cansancio, debilidad o fatiga.  · Respiración rápida o sensación de falta el aire.  Estos síntomas pueden aparecer en el término de 2 a 14 días después de haber estado expuesto al virus. Si presenta síntomas, llame al médico. Las personas con síntomas graves pueden necesitar atención hospitalaria.  Si estoy expuesto al virus, ¿cuánto tiempo tardan en aparecer los síntomas?  Los síntomas de la enfermedad por coronavirus pueden aparecer en cualquier momento en el término de 2 a 14 días después de que una persona haya estado expuesta al virus. Si presenta síntomas, llame al médico.  ¿Debo hacerme un análisis de detección del virus?  El médico decidirá si debe realizarse un análisis en función de sus síntomas, antecedentes de exposición y factores de riesgo.  ¿Cómo realiza el médico el análisis para detectar este virus?  Los médicos obtienen muestras para enviar a analizar. Estas  muestras pueden incluir lo siguiente:  · Tomar con un hisopo líquido de la nariz.  · Pedirle que tosa mucosidad (esputo) para extraer líquido de los pulmones en un recipiente estéril.  · Tomar una muestra de sangre.  · Tomar una muestra de heces u orina.  ¿Hay algún tratamiento o vacuna para este virus?  Actualmente, no existe ninguna vacuna para prevenir la enfermedad por coronavirus. Además, no existen medicamentos como los antibióticos o los antivirales para tratar el virus. Una persona que se enferma recibe tratamiento de apoyo, lo que significa reposo y líquidos. Una persona también puede aliviar sus síntomas con medicamentos de venta libre para tratar los estornudos, la tos y el goteo nasal. Son los mismos medicamentos que se toman para el resfrío común.  Si presenta síntomas, llame al médico. Las personas con síntomas graves pueden necesitar atención hospitalaria.  ¿Qué puedo hacer para protegerme y proteger a mi familia de este virus?         Puede protegerse y proteger a su familia tomando las mismas medidas que tomaría para prevenir el contagio de otros virus. Tome las siguientes medidas:  · Lavarse las manos regularmente con agua y jabón durante al menos 20 segundos. Usar desinfectante para manos con alcohol si no dispone   de agua y jabón.  · Evitar tocarse la cara, la boca, la nariz y los ojos.  · Toser o estornudar en un pañuelo descartable, sobre su manga o codo. No toser o estornudar al aire ni cubrirse con la mano.  ? Si tose o estornuda en un pañuelo de papel, deséchelo inmediatamente y lávese las manos.  · Desinfectar los objetos y las superficies que se tocan con frecuencia todos los días.  · Evitar el contacto cercano con personas que estén enfermas o que tengan fiebre o tos. Permanecer al menos a una distancia de 3 a 6 pies (1-2 m) de las otras personas, si es posible.  · Quedarse en su casa si está enfermo, excepto para obtener atención médica. Llame al médico antes de buscar atención  médica.  · Asegúrese de tener las vacunas al día. Pregúntele al médico qué vacunas necesita.  ¿Qué debo hacer si tengo que viajar?  Siga las recomendaciones relacionadas con los viajes de la autoridad de salud local, los CDC y la OMS.  Información y consejos para viajeros  · Centers for Disease Control and Prevention (CDC) (Centros para el Control y la Prevención de Enfermedades): www.cdc.gov/coronavirus/2019-ncov/travelers/index.html  · Organización Mundial de la Salud (OMS): www.who.int/emergencies/diseases/novel-coronavirus-2019/travel-advice  Conozca los riesgos y tome medidas para proteger su salud  · El riesgo de contraer la enfermedad por coronavirus es más alto si viaja a zonas con un brote o si está en contacto con viajeros que provienen de zonas donde hay un brote.  · Lávese las manos con frecuencia y mantenga una higiene adecuada para reducir el riesgo de contagiarse o transmitir el virus.  ¿Qué debo hacer si estoy enfermo?  Instrucciones generales para detener la propagación de la infección  · Lavarse las manos regularmente con agua y jabón durante al menos 20 segundos. Usar desinfectante para manos con alcohol si no dispone de agua y jabón.  · Toser o estornudar en un pañuelo descartable, sobre su manga o codo. No toser o estornudar al aire ni cubrirse con la mano.  · Si tose o estornuda en un pañuelo de papel, deséchelo inmediatamente y lávese las manos.  · Quédese en su casa a menos que deba recibir atención médica. Llame al médico o a la autoridad de salud local antes de buscar atención médica.  · Evite las zonas públicas. No viaje en transporte público, de ser posible.  · Si puede, use un barbijo si debe salir de la casa o si está en contacto cercano con alguien que no está enfermo.  Mantenga su casa limpia  · Desinfecte los objetos y las superficies que se tocan con frecuencia todos los días. Pueden incluir:  ? Encimeras y mesas.  ? Picaportes e interruptores de luz.  ? Lavabos, fregaderos y  grifos.  ? Aparatos electrónicos tales como teléfonos, controles remotos, teclados, computadoras y tabletas.  · Lave los platos con agua jabonosa caliente o en el lavavajillas. Deje los platos para que se sequen al aire.  · Lave la ropa con agua caliente.  Evite infectar a otros miembros de la familia  · Permita que los miembros de la familia sanos cuiden a los niños y las mascotas, si es posible. Si tiene que cuidar a los niños o las mascotas, lávese las manos con frecuencia y use un barbijo.  · Duerma en una habitación o cama diferentes, si es posible.  · No comparta elementos personales, como afeitadoras, cepillos de dientes, desodorantes, peines, cepillos, toallas y toallitas de mano.  Dónde buscar más información    Centers for Disease Control and Prevention (CDC)  · Actualizaciones de información y novedades: www.cdc.gov/coronavirus/2019-ncov  Organización Mundial de la Salud (OMS)  · Actualizaciones de información y novedades: www.who.int/emergencies/diseases/novel-coronavirus-2019  · Tema de salud relacionado con el coronavirus: www.who.int/health-topics/coronavirus  · Preguntas y respuestas sobre COVID-19: www.who.int/news-room/q-a-detail/q-a-coronaviruses  · Registro mundial: who.sprinklr.com  American Academy of Pediatrics (AAP) (Academia Estadounidense de Pediatría)  · Información para familias: www.healthychildren.org/English/health-issues/conditions/chest-lungs/Pages/2019-Novel-Coronavirus.aspx  La situación del coronavirus cambia rápidamente. Consulte el sitio web de su autoridad de salud local o los sitios web de los CDC y la OMS para enterarse de las novedades y noticias.  ¿Cuándo debo comunicarme con un médico?  · Comuníquese con su médico si tiene síntomas de infección, como fiebre o tos, y:  ? Ha estado cerca de alguien que sabe que tiene la enfermedad por coronavirus.  ? Ha estado en contacto con una persona que presuntamente sufra de la enfermedad por coronavirus.  ? Ha viajado fuera del  país.  ¿Cuándo debo buscar asistencia médica inmediata?  · Busque ayuda de inmediato llamando al servicio de emergencias de su localidad (911 en los [place]) si tiene lo siguiente:  ? Dificultad para respirar.  ? Dolor u opresión en el pecho.  ? Confusión.  ? Labios y uñas de color azulado.  ? Dificultad para despertarse.  ? Síntomas que empeoran.  Informe al personal médico de emergencias si cree que tiene la enfermedad por coronavirus.  Resumen  · Un nuevo virus respiratorio se propaga de una persona a otra y causa COVID-19 (enfermedad por coronavirus).  · El virus que causa el COVID-19 parece diseminarse fácilmente. Se transmite de una persona a otra a través de las gotitas que se despiden al toser o al estornudar.  · Los adultos mayores y las personas que tienen enfermedades crónicas tienen mayor riesgo de contraer la enfermedad. Si tiene un riesgo más alto de tener complicaciones, tome precauciones adicionales.  · Actualmente, no existe ninguna vacuna para prevenir la enfermedad por coronavirus. No existen medicamentos, como los antibióticos o los antivirales, para tratar el virus.  · Puede protegerse y proteger a su familia al lavarse las manos con frecuencia, evitar tocarse la cara y cubrirse al toser y estornudar.  Esta información no tiene como fin reemplazar el consejo del médico. Asegúrese de hacerle al médico cualquier pregunta que tenga.  Document Released: 08/27/2018 Document Revised: 08/27/2018 Document Reviewed: 08/27/2018  Elsevier Patient Education © 2020 Elsevier Inc.

## 2019-01-12 LAB — CMP14+EGFR
ALT: 23 IU/L (ref 0–32)
AST: 13 IU/L (ref 0–40)
Albumin/Globulin Ratio: 1.4 (ref 1.2–2.2)
Albumin: 4.2 g/dL (ref 3.8–4.8)
Alkaline Phosphatase: 41 IU/L (ref 39–117)
BUN/Creatinine Ratio: 16 (ref 12–28)
BUN: 16 mg/dL (ref 8–27)
Bilirubin Total: 0.3 mg/dL (ref 0.0–1.2)
CO2: 25 mmol/L (ref 20–29)
Calcium: 9.8 mg/dL (ref 8.7–10.3)
Chloride: 99 mmol/L (ref 96–106)
Creatinine, Ser: 0.98 mg/dL (ref 0.57–1.00)
GFR calc Af Amer: 70 mL/min/{1.73_m2} (ref >59)
GFR calc non Af Amer: 60 mL/min/{1.73_m2} (ref >59)
Globulin, Total: 3 g/dL (ref 1.5–4.5)
Glucose: 118 mg/dL — ABNORMAL HIGH (ref 65–99)
Potassium: 4.9 mmol/L (ref 3.5–5.2)
Sodium: 138 mmol/L (ref 134–144)
Total Protein: 7.2 g/dL (ref 6.0–8.5)

## 2019-01-12 LAB — LIPID PANEL
Chol/HDL Ratio: 5.8 ratio — ABNORMAL HIGH (ref 0.0–4.4)
Cholesterol, Total: 239 mg/dL — ABNORMAL HIGH (ref 100–199)
HDL: 41 mg/dL (ref >39)
LDL Chol Calc (NIH): 161 mg/dL — ABNORMAL HIGH (ref 0–99)
Triglycerides: 202 mg/dL — ABNORMAL HIGH (ref 0–149)
VLDL Cholesterol Cal: 37 mg/dL (ref 5–40)

## 2019-01-12 LAB — CBC
Hematocrit: 38.3 % (ref 34.0–46.6)
Hemoglobin: 12.3 g/dL (ref 11.1–15.9)
MCH: 29.6 pg (ref 26.6–33.0)
MCHC: 32.1 g/dL (ref 31.5–35.7)
MCV: 92 fL (ref 79–97)
Platelets: 321 10*3/uL (ref 150–450)
RBC: 4.15 x10E6/uL (ref 3.77–5.28)
RDW: 15.1 % (ref 11.7–15.4)
WBC: 12.9 10*3/uL — ABNORMAL HIGH (ref 3.4–10.8)

## 2019-01-13 ENCOUNTER — Other Ambulatory Visit: Payer: Self-pay | Admitting: Nurse Practitioner

## 2019-01-13 MED ORDER — PRAVASTATIN SODIUM 40 MG PO TABS: 40.0000 mg | ORAL_TABLET | Freq: Every day | ORAL | 3 refills | Status: DC

## 2019-01-14 MED FILL — TRUEplus LANCETS 28G MISC: 25 days supply | Qty: 100 | Fill #0

## 2019-01-14 MED FILL — TRUE METRIX TEST STRIP: 25 days supply | Qty: 100 | Fill #0

## 2019-01-14 MED FILL — !TRUE METRIX BLOOD GLUCOSE: 30 days supply | Qty: 1 | Fill #0

## 2019-01-23 ENCOUNTER — Other Ambulatory Visit: Payer: Self-pay | Admitting: Nurse Practitioner

## 2019-01-23 DIAGNOSIS — Z1231 Encounter for screening mammogram for malignant neoplasm of breast: Secondary | ICD-10-CM

## 2019-01-30 ENCOUNTER — Encounter: Payer: Self-pay | Admitting: Nurse Practitioner

## 2019-01-31 ENCOUNTER — Other Ambulatory Visit: Payer: Self-pay | Admitting: Nurse Practitioner

## 2019-01-31 DIAGNOSIS — Z1231 Encounter for screening mammogram for malignant neoplasm of breast: Secondary | ICD-10-CM

## 2019-02-01 ENCOUNTER — Other Ambulatory Visit: Payer: Self-pay | Admitting: Nurse Practitioner

## 2019-02-01 DIAGNOSIS — J1282 Pneumonia due to coronavirus disease 2019: Secondary | ICD-10-CM

## 2019-02-01 DIAGNOSIS — U071 COVID-19: Secondary | ICD-10-CM

## 2019-02-22 ENCOUNTER — Telehealth: Payer: Self-pay | Admitting: Nurse Practitioner

## 2019-02-22 ENCOUNTER — Ambulatory Visit
Admission: RE | Admit: 2019-02-22 | Discharge: 2019-02-22 | Disposition: A | Discharge status: Home / Self Care | Payer: HRSA Program | Source: Ambulatory Visit | Attending: Nurse Practitioner | Admitting: Nurse Practitioner

## 2019-02-22 DIAGNOSIS — U071 COVID-19: Secondary | ICD-10-CM | POA: Diagnosis present

## 2019-02-22 DIAGNOSIS — J1289 Other viral pneumonia: Secondary | ICD-10-CM | POA: Diagnosis present

## 2019-02-22 MED FILL — TRUE METRIX TEST STRIP: 25 days supply | Qty: 100 | Fill #0

## 2019-02-22 MED FILL — !TRUE METRIX BLOOD GLUCOSE: 30 days supply | Qty: 1 | Fill #0

## 2019-02-22 MED FILL — TRUEplus LANCETS 28G MISC: 25 days supply | Qty: 100 | Fill #0

## 2019-02-22 NOTE — Telephone Encounter (Signed)
New Message  Pt came in to the office requesting a referral for a foot exam, colon cancer screening and a bone density. Pt is requesting it be in [place] or [place] due to her living in [place]. Please f/u

## 2019-02-23 LAB — FECAL OCCULT BLOOD, IMMUNOCHEMICAL

## 2019-02-25 ENCOUNTER — Encounter: Payer: Self-pay | Admitting: Nurse Practitioner

## 2019-02-25 ENCOUNTER — Other Ambulatory Visit: Payer: Self-pay | Admitting: Nurse Practitioner

## 2019-02-25 DIAGNOSIS — M8580 Other specified disorders of bone density and structure, unspecified site: Secondary | ICD-10-CM

## 2019-02-25 DIAGNOSIS — E1165 Type 2 diabetes mellitus with hyperglycemia: Secondary | ICD-10-CM

## 2019-02-27 NOTE — Telephone Encounter (Signed)
Attempt to reach patient regarding Xray results and referral. No answer and LVM.

## 2019-03-08 ENCOUNTER — Ambulatory Visit
Admission: RE | Admit: 2019-03-08 | Discharge: 2019-03-08 | Disposition: A | Discharge status: Home / Self Care | Payer: Self-pay | Source: Ambulatory Visit | Attending: Nurse Practitioner | Admitting: Nurse Practitioner

## 2019-03-08 DIAGNOSIS — Z1231 Encounter for screening mammogram for malignant neoplasm of breast: Secondary | ICD-10-CM | POA: Insufficient documentation

## 2019-03-11 ENCOUNTER — Other Ambulatory Visit: Payer: Self-pay | Admitting: Nurse Practitioner

## 2019-03-11 DIAGNOSIS — R928 Other abnormal and inconclusive findings on diagnostic imaging of breast: Secondary | ICD-10-CM

## 2019-03-12 ENCOUNTER — Other Ambulatory Visit: Payer: Self-pay | Admitting: Nurse Practitioner

## 2019-03-12 DIAGNOSIS — N6489 Other specified disorders of breast: Secondary | ICD-10-CM

## 2019-03-12 DIAGNOSIS — R928 Other abnormal and inconclusive findings on diagnostic imaging of breast: Secondary | ICD-10-CM

## 2019-03-20 ENCOUNTER — Ambulatory Visit
Admission: RE | Admit: 2019-03-20 | Discharge: 2019-03-20 | Disposition: A | Discharge status: Home / Self Care | Payer: Self-pay | Source: Ambulatory Visit | Attending: Nurse Practitioner | Admitting: Nurse Practitioner

## 2019-03-20 DIAGNOSIS — R928 Other abnormal and inconclusive findings on diagnostic imaging of breast: Secondary | ICD-10-CM

## 2019-03-20 DIAGNOSIS — N6489 Other specified disorders of breast: Secondary | ICD-10-CM

## 2019-03-21 ENCOUNTER — Other Ambulatory Visit: Payer: Self-pay | Admitting: Nurse Practitioner

## 2019-03-21 DIAGNOSIS — R928 Other abnormal and inconclusive findings on diagnostic imaging of breast: Secondary | ICD-10-CM

## 2019-04-01 MED FILL — TRUEplus LANCETS 28G MISC: 25 days supply | Qty: 100 | Fill #1

## 2019-04-01 MED FILL — TRUE METRIX TEST STRIP: 25 days supply | Qty: 100 | Fill #1

## 2019-04-12 ENCOUNTER — Other Ambulatory Visit: Payer: Self-pay

## 2019-04-12 ENCOUNTER — Ambulatory Visit: Payer: Self-pay | Attending: Nurse Practitioner | Admitting: Nurse Practitioner

## 2019-04-12 DIAGNOSIS — I10 Essential (primary) hypertension: Secondary | ICD-10-CM

## 2019-04-12 DIAGNOSIS — G8929 Other chronic pain: Secondary | ICD-10-CM

## 2019-04-12 DIAGNOSIS — E1165 Type 2 diabetes mellitus with hyperglycemia: Secondary | ICD-10-CM

## 2019-04-12 DIAGNOSIS — E785 Hyperlipidemia, unspecified: Secondary | ICD-10-CM

## 2019-04-12 DIAGNOSIS — M25561 Pain in right knee: Secondary | ICD-10-CM

## 2019-04-12 MED ORDER — DICLOFENAC SODIUM 1 % EX GEL: 2.0000 g | Freq: Four times a day (QID) | CUTANEOUS | 1 refills | Status: AC

## 2019-04-12 MED ORDER — DICLOFENAC SODIUM 1 % EX GEL: 2.0000 g | Freq: Four times a day (QID) | CUTANEOUS | 1 refills | Status: DC

## 2019-04-12 MED FILL — DICLOFENAC SODIUM 1% GEL: 1 | 12 days supply | Qty: 100 | Fill #0

## 2019-04-12 NOTE — Progress Notes (Signed)
Virtual Visit via Telephone Note Due to national recommendations of social distancing due to COVID 19, telehealth visit is felt to be most appropriate for this patient at this time.  I discussed the limitations, risks, security and privacy concerns of performing an evaluation and management service by telephone and the availability of in person appointments. I also discussed with the patient that there may be a patient responsible charge related to this service. The patient expressed understanding and agreed to proceed.    I connected with Christina Clark on 04/12/19  at   1:30 PM EST  EDT by telephone and verified that I am speaking with the correct person using two identifiers.   Consent I discussed the limitations, risks, security and privacy concerns of performing an evaluation and management service by telephone and the availability of in person appointments. I also discussed with the patient that there may be a patient responsible charge related to this service. The patient expressed understanding and agreed to proceed.   Location of Patient: Private Residence   [facility]    Persons participating in Telemedicine visit: Christina Clark  Spanish Interpreter ID# [id number]   History of Present Illness: Telemedicine visit for: Follow UP.  DM TYPE 2 Monitoring blood glucose levels twice a day. Fasting average: 90-120s.  Post prandial: 110-140s. Taking metformin 500 mg BID. Denies any symptoms of hypo or hyperglycemia. She is still overdue for eye exam. Referral was placed in October.  Lab Results  Component Value Date   HGBA1C 6.7 (A) 01/11/2019    Essential Hypertension Well controlled. She does not monitor her blood pressure at home.  Sedentary lifestyle. Denies chest pain, shortness of breath, palpitations, lightheadedness, dizziness, headaches or BLE edema. Taking losartan 50 mg  daily as prescribed as well as HCTZ 25 mg daily.  BP Readings from Last 3 Encounters:  01/11/19 135/71  11/18/18 (!) 122/56  10/10/18 (!) 119/53    Right Knee Pain Ongoing for the past several months after she was diagnosed with COVID and hospitalized in June 2020. Taking otc Ibuprofen 200 mg for pain as needed. Denies any injury or trauma. Using a cane to assist with mobility however this is not new. Aggravating factors: Prolonged walking, standing, weight bearing. Relieving factors: rest, pain goes away on its own.  Hyperlipidemia Patient presents for follow up to hyperlipidemia.  She is medication compliant. She is not consistently diet compliant and denies statin intolerance including myalgias. LDL not at goal of <70.  Lab Results  Component Value Date   CHOL 239 (H) 01/11/2019   Lab Results  Component Value Date   HDL 41 01/11/2019   Lab Results  Component Value Date   LDLCALC 161 (H) 01/11/2019   Lab Results  Component Value Date   TRIG 202 (H) 01/11/2019   Lab Results  Component Value Date   CHOLHDL 5.8 (H) 01/11/2019     Past Medical History:  Diagnosis Date  . Depression    After husbands death, takes medication  . Diabetes mellitus without complication (HCC)   . Hypertension     History reviewed. No pertinent surgical history.  Family History  Problem Relation Age of Onset  . Diabetes Neg Hx   . Hypertension Neg Hx     Social History   Socioeconomic History  . Marital status: Widowed    Spouse name: Not on file  . Number of children: Not on file  .  Years of education: Not on file  . Highest education level: Not on file  Occupational History  . Not on file  Tobacco Use  . Smoking status: Never Smoker  . Smokeless tobacco: Never Used  Substance and Sexual Activity  . Alcohol use: Not Currently  . Drug use: Never  . Sexual activity: Not Currently  Other Topics Concern  . Not on file  Social History Narrative  . Not on file   Social  Determinants of Health   Financial Resource Strain: Low Risk   . Difficulty of Paying Living Expenses: Not hard at all  Food Insecurity: No Food Insecurity  . Worried About Charity fundraiser in the Last Year: Never true  . Ran Out of Food in the Last Year: Never true  Transportation Needs: No Transportation Needs  . Lack of Transportation (Medical): No  . Lack of Transportation (Non-Medical): No  Physical Activity: Insufficiently Active  . Days of Exercise per Week: 1 day  . Minutes of Exercise per Session: 10 min  Stress: No Stress Concern Present  . Feeling of Stress : Not at all  Social Connections: Somewhat Isolated  . Frequency of Communication with Friends and Family: More than three times a week  . Frequency of Social Gatherings with Friends and Family: More than three times a week  . Attends Religious Services: 1 to 4 times per year  . Active Member of Clubs or Organizations: No  . Attends Archivist Meetings: Never  . Marital Status: Widowed     Observations/Objective: Awake, alert and oriented x 3   Review of Systems  Constitutional: Negative for fever, malaise/fatigue and weight loss.  HENT: Negative.  Negative for nosebleeds.   Eyes: Negative.  Negative for blurred vision, double vision and photophobia.  Respiratory: Negative.  Negative for cough and shortness of breath.   Cardiovascular: Negative.  Negative for chest pain, palpitations and leg swelling.  Gastrointestinal: Negative.  Negative for heartburn, nausea and vomiting.  Musculoskeletal: Positive for joint pain. Negative for myalgias.  Neurological: Negative.  Negative for dizziness, focal weakness, seizures and headaches.  Psychiatric/Behavioral: Negative.  Negative for suicidal ideas.    Assessment and Plan: Christina Clark was seen today for diabetes and hypertension.  Diagnoses and all orders for this visit:  Uncontrolled type 2 diabetes mellitus with hyperglycemia ([employer]) Diabetes is poorly  controlled. Advised patient to keep a fasting blood sugar log fast, 2 hours post lunch and bedtime which will be reviewed at the next office visit.  Continue blood sugar control as discussed in office today, low carbohydrate diet, and regular physical exercise as tolerated, 150 minutes per week (30 min each day, 5 days per week, or 50 min 3 days per week). Keep blood sugar logs with fasting goal of 90-130 mg/dl, post prandial (after you eat) less than 180.  For Hypoglycemia: BS <60 and Hyperglycemia BS >400; contact the clinic ASAP. Annual eye exams and foot exams are recommended.  Essential hypertension Continue all antihypertensives as prescribed.  Remember to bring in your blood pressure log with you for your follow up appointment.  DASH/Mediterranean Diets are healthier choices for HTN.    Dyslipidemia, goal LDL below 70 INSTRUCTIONS: Work on a low fat, heart healthy diet and participate in regular aerobic exercise program by working out at least 150 minutes per week; 5 days a week-30 minutes per day. Avoid red meat/beef/steak,  fried foods. junk foods, sodas, sugary drinks, unhealthy snacking, alcohol and smoking.  Drink at least  80 oz of water per day and monitor your carbohydrate intake daily.   Chronic pain of right knee -     diclofenac Sodium (VOLTAREN) 1 % GEL; Apply 2 g topically 4 (four) times daily. .Work on losing weight to help reduce joint pain. May alternate with heat and ice application for pain relief. May also alternate with acetaminophen and Ibuprofen as prescribed pain relief. Other alternatives include massage, acupuncture and water aerobics.  You must stay active and avoid a sedentary lifestyle.     Follow Up Instructions Return in about 3 months (around 07/11/2019).     I discussed the assessment and treatment plan with the patient. The patient was provided an opportunity to ask questions and all were answered. The patient agreed with the plan and demonstrated an  understanding of the instructions.   The patient was advised to call back or seek an in-person evaluation if the symptoms worsen or if the condition fails to improve as anticipated.  I provided 21 minutes of non-face-to-face time during this encounter including median intraservice time, reviewing previous notes, labs, imaging, medications and explaining diagnosis and management.  Claiborne Rigg, FNP-BC

## 2019-04-13 ENCOUNTER — Encounter: Payer: Self-pay | Admitting: Nurse Practitioner

## 2019-04-15 ENCOUNTER — Other Ambulatory Visit: Payer: Self-pay | Admitting: Nurse Practitioner

## 2019-04-15 MED ORDER — MISC. DEVICES MISC: 0 refills | Status: DC

## 2019-04-18 ENCOUNTER — Telehealth: Payer: Self-pay

## 2019-04-18 NOTE — Telephone Encounter (Signed)
CMA faxed over Orders to remove oxygen tank to [facility]. Fax number : [phone].

## 2019-05-06 MED FILL — TRUE METRIX TEST STRIP: 25 days supply | Qty: 100 | Fill #2

## 2019-05-06 MED FILL — TRUEplus LANCETS 28G MISC: 25 days supply | Qty: 100 | Fill #2

## 2019-06-17 ENCOUNTER — Encounter: Payer: Self-pay | Admitting: Nurse Practitioner

## 2019-06-17 MED FILL — TRUE METRIX TEST STRIP: 25 days supply | Qty: 100 | Fill #3

## 2019-06-17 MED FILL — TRUEplus LANCETS 28G MISC: 25 days supply | Qty: 100 | Fill #3

## 2019-06-18 ENCOUNTER — Other Ambulatory Visit: Payer: Self-pay | Admitting: Nurse Practitioner

## 2019-06-18 DIAGNOSIS — F3341 Major depressive disorder, recurrent, in partial remission: Secondary | ICD-10-CM

## 2019-06-18 DIAGNOSIS — I10 Essential (primary) hypertension: Secondary | ICD-10-CM

## 2019-06-18 MED ORDER — SERTRALINE HCL 50 MG PO TABS: 50.0000 mg | ORAL_TABLET | Freq: Every day | ORAL | 2 refills | Status: DC

## 2019-06-18 MED ORDER — LOSARTAN POTASSIUM 50 MG PO TABS: 50.0000 mg | ORAL_TABLET | Freq: Every day | ORAL | 1 refills | Status: DC

## 2019-06-18 MED ORDER — HYDROCHLOROTHIAZIDE 25 MG PO TABS: 25.0000 mg | ORAL_TABLET | Freq: Every day | ORAL | 1 refills | Status: DC

## 2019-07-12 ENCOUNTER — Ambulatory Visit: Payer: Self-pay | Admitting: Nurse Practitioner

## 2019-07-12 ENCOUNTER — Other Ambulatory Visit: Payer: Self-pay | Admitting: Nurse Practitioner

## 2019-07-12 DIAGNOSIS — E1165 Type 2 diabetes mellitus with hyperglycemia: Secondary | ICD-10-CM

## 2019-07-12 DIAGNOSIS — IMO0002 Reserved for concepts with insufficient information to code with codable children: Secondary | ICD-10-CM

## 2019-07-12 MED FILL — TRUE METRIX TEST STRIP: 25 days supply | Qty: 100 | Fill #4

## 2019-07-12 MED FILL — TRUEplus LANCETS 28G MISC: 25 days supply | Qty: 100 | Fill #0

## 2019-07-15 ENCOUNTER — Other Ambulatory Visit: Payer: Self-pay

## 2019-07-15 ENCOUNTER — Ambulatory Visit: Payer: Self-pay | Attending: Nurse Practitioner | Admitting: Nurse Practitioner

## 2019-07-15 ENCOUNTER — Encounter: Payer: Self-pay | Admitting: Nurse Practitioner

## 2019-07-15 VITALS — BP 115/68 | HR 67 | Temp 97.7°F | Ht 64.0 in | Wt 230.0 lb

## 2019-07-15 DIAGNOSIS — Z6841 Body Mass Index (BMI) 40.0 and over, adult: Secondary | ICD-10-CM

## 2019-07-15 DIAGNOSIS — I10 Essential (primary) hypertension: Secondary | ICD-10-CM

## 2019-07-15 DIAGNOSIS — Z130 Encounter for screening for diseases of the blood and blood-forming organs and certain disorders involving the immune mechanism: Secondary | ICD-10-CM

## 2019-07-15 DIAGNOSIS — E785 Hyperlipidemia, unspecified: Secondary | ICD-10-CM

## 2019-07-15 DIAGNOSIS — J302 Other seasonal allergic rhinitis: Secondary | ICD-10-CM

## 2019-07-15 DIAGNOSIS — E1165 Type 2 diabetes mellitus with hyperglycemia: Secondary | ICD-10-CM

## 2019-07-15 LAB — POCT GLYCOSYLATED HEMOGLOBIN (HGB A1C): Hemoglobin A1C: 6.6 % — AB (ref 4.0–5.6)

## 2019-07-15 LAB — GLUCOSE, POCT (MANUAL RESULT ENTRY): POC Glucose: 190 mg/dl — AB (ref 70–99)

## 2019-07-15 MED ORDER — TRUE METRIX BLOOD GLUCOSE TEST VI STRP: ORAL_STRIP | 12 refills | Status: DC

## 2019-07-15 MED ORDER — METFORMIN HCL 500 MG PO TABS: 500.0000 mg | ORAL_TABLET | Freq: Two times a day (BID) | ORAL | 1 refills | Status: DC

## 2019-07-15 MED ORDER — AZELASTINE HCL 0.05 % OP SOLN: 1.0000 [drp] | Freq: Every day | OPHTHALMIC | 12 refills | Status: DC

## 2019-07-15 MED ORDER — TRUEPLUS LANCETS 28G MISC: 3 refills | Status: DC

## 2019-07-15 MED ORDER — PRAVASTATIN SODIUM 40 MG PO TABS: 40.0000 mg | ORAL_TABLET | Freq: Every day | ORAL | 3 refills | Status: DC

## 2019-07-15 NOTE — Progress Notes (Signed)
Assessment & Plan:  Christina Clark was seen today for follow-up.  Diagnoses and all orders for this visit:  Uncontrolled type 2 diabetes mellitus with hyperglycemia (HCC) -     Glucose (CBG) -     HgB A1c -     metFORMIN (GLUCOPHAGE) 500 MG tablet; Take 1 tablet (500 mg total) by mouth 2 (two) times daily with a meal. -     glucose blood (TRUE METRIX BLOOD GLUCOSE TEST) test strip; Use as instructed. Check blood glucose level by fingerstick once per day. -     TRUEplus Lancets 28G MISC; Use as instructed. Check blood glucose level by fingerstick twice per day. -     Ambulatory referral to Ophthalmology Continue blood sugar control as discussed in office today, low carbohydrate diet, and regular physical exercise as tolerated, 150 minutes per week (30 min each day, 5 days per week, or 50 min 3 days per week). Keep blood sugar logs with fasting goal of 90-130 mg/dl, post prandial (after you eat) less than 180.  For Hypoglycemia: BS <60 and Hyperglycemia BS >400; contact the clinic ASAP. Annual eye exams and foot exams are recommended.   Morbid obesity with BMI of 40.0-44.9, adult ([place]) -     Lipid panel Discussed diet and exercise for person with BMI >39. Instructed: You must burn more calories than you eat. Losing 5 percent of your body weight should be considered a success. In the longer term, losing more than 15 percent of your body weight and staying at this weight is an extremely good result. However, keep in mind that even losing 5 percent of your body weight leads to important health benefits, so try not to get discouraged if you're not able to lose more than this. Will recheck weight in 3-6 months.  Essential hypertension -     CMP14+EGFR Continue all antihypertensives as prescribed.  Remember to bring in your blood pressure log with you for your follow up appointment.  DASH/Mediterranean Diets are healthier choices for HTN.    Dyslipidemia, goal LDL below 70 -     Lipid panel -      pravastatin (PRAVACHOL) 40 MG tablet; Take 1 tablet (40 mg total) by mouth daily. INSTRUCTIONS: Work on a low fat, heart healthy diet and participate in regular aerobic exercise program by working out at least 150 minutes per week; 5 days a week-30 minutes per day. Avoid red meat/beef/steak,  fried foods. junk foods, sodas, sugary drinks, unhealthy snacking, alcohol and smoking.  Drink at least 80 oz of water per day and monitor your carbohydrate intake daily.   Screening for deficiency anemia -     CBC  Seasonal allergies -     azelastine (OPTIVAR) 0.05 % ophthalmic solution; Place 1 drop into both eyes daily.    Patient has been counseled on age-appropriate routine health concerns for screening and prevention. These are reviewed and up-to-date. Referrals have been placed accordingly. Immunizations are up-to-date or declined.    Subjective:   Chief Complaint  Patient presents with  . Follow-up    Pt. is here for blood pressure and diabetes.    HPI Christina Clark 69 y.o. female presents to office today for follow up. She just finished completion of her pfizer vaccine for COVID 19.   DM Well controlled.  A1c down from 6.7-6.6 today.  She is taking Metformin 500 mg twice daily as prescribed.  Not consistently diet adherent in regard to low-carb.  She is overdue for eye  exam Lab Results  Component Value Date   HGBA1C 6.6 (A) 07/15/2019   Lab Results  Component Value Date   HGBA1C 6.7 (A) 01/11/2019    Essential Hypertension Well controlled. Taking  Losartan 50 mg and HCTZ 25 mg daily.  She monitors her blood pressure at home with average readings 120/60 to 70s.  She has a blood pressure log here with her today. Denies chest pain, shortness of breath, palpitations, lightheadedness, dizziness, headaches or BLE edema.  Her weight is up significantly and I have encouraged her to lose at least 5 pounds prior to her next office visit for her physical. BP Readings from Last 3  Encounters:  07/15/19 115/68  01/11/19 135/71  11/18/18 (!) 122/56     Dyslipidemia LDL is not at goal and likely related to diet.  She is taking pravastatin 40 mg daily as prescribed and denies any statin intolerance or myalgias. Lab Results  Component Value Date   LDLCALC 161 (H) 01/11/2019     Review of Systems  Constitutional: Negative for fever, malaise/fatigue and weight loss.  HENT: Positive for hearing loss. Negative for nosebleeds.   Eyes: Positive for redness (Itchy watery eyes). Negative for blurred vision, double vision and photophobia.  Respiratory: Negative.  Negative for cough and shortness of breath.   Cardiovascular: Negative.  Negative for chest pain, palpitations and leg swelling.  Gastrointestinal: Negative.  Negative for heartburn, nausea and vomiting.  Musculoskeletal: Negative.  Negative for myalgias.  Neurological: Negative.  Negative for dizziness, focal weakness, seizures and headaches.  Psychiatric/Behavioral: Negative.  Negative for suicidal ideas.    Past Medical History:  Diagnosis Date  . Depression    After husbands death, takes medication  . Diabetes mellitus without complication ([place])   . Hypertension     No past surgical history on file.  Family History  Problem Relation Age of Onset  . Diabetes Neg Hx   . Hypertension Neg Hx     Social History Reviewed with no changes to be made today.   Outpatient Medications Prior to Visit  Medication Sig Dispense Refill  . hydrochlorothiazide (HYDRODIURIL) 25 MG tablet Take 1 tablet (25 mg total) by mouth daily. 90 tablet 1  . losartan (COZAAR) 50 MG tablet Take 1 tablet (50 mg total) by mouth daily. 90 tablet 1  . sertraline (ZOLOFT) 50 MG tablet Take 1 tablet (50 mg total) by mouth daily. 90 tablet 2  . glucose blood (TRUE METRIX BLOOD GLUCOSE TEST) test strip Use as instructed. Check blood glucose level by fingerstick once per day.  Please mail to patient 100 each 12  . metFORMIN  (GLUCOPHAGE) 500 MG tablet Take 1 tablet (500 mg total) by mouth 2 (two) times daily with a meal. 180 tablet 1  . pravastatin (PRAVACHOL) 40 MG tablet Take 1 tablet (40 mg total) by mouth daily. 90 tablet 3  . TRUEplus Lancets 28G MISC USE AS INSTRUCTED. CHECK BLOOD GLUCOSE LEVEL BY FINGERSTICK ONCE PER DAY. PLEASE MAIL TO PATIENT 100 each 3  . Misc. Devices MISC Please discontinue home O2. Patient no longer requires the oxygen tank in her home. (Patient not taking: Reported on 07/15/2019) 1 each 0   No facility-administered medications prior to visit.    Allergies  Allergen Reactions  . Chocolate     Reports vertigo sx  . Coffee Bean Extract [Coffea Arabica]        Objective:    BP 115/68 (BP Location: Left Arm, Patient Position: Sitting, Cuff Size: Large)  Pulse 67   Temp 97.7 F (36.5 C) (Temporal)   Ht '5\' 4"'$  (1.626 m)   Wt 230 lb (104.3 kg)   SpO2 95%   BMI 39.48 kg/m  Wt Readings from Last 3 Encounters:  07/15/19 230 lb (104.3 kg)  01/11/19 213 lb 9.6 oz (96.9 kg)  11/03/18 215 lb 2.7 oz (97.6 kg)    Physical Exam Vitals and nursing note reviewed.  Constitutional:      Appearance: She is well-developed.  HENT:     Head: Normocephalic and atraumatic.  Eyes:     Extraocular Movements: Extraocular movements intact.   Cardiovascular:     Rate and Rhythm: Normal rate and regular rhythm.     Heart sounds: Normal heart sounds. No murmur. No friction rub. No gallop.   Pulmonary:     Effort: Pulmonary effort is normal. No tachypnea or respiratory distress.     Breath sounds: Normal breath sounds. No decreased breath sounds, wheezing, rhonchi or rales.  Chest:     Chest wall: No tenderness.  Abdominal:     General: Bowel sounds are normal.     Palpations: Abdomen is soft.  Musculoskeletal:        General: Normal range of motion.     Cervical back: Normal range of motion.  Skin:    General: Skin is warm and dry.  Neurological:     Mental Status: She is alert and  oriented to person, place, and time.     Coordination: Coordination normal.  Psychiatric:        Behavior: Behavior normal. Behavior is cooperative.        Thought Content: Thought content normal.        Judgment: Judgment normal.          Patient has been counseled extensively about nutrition and exercise as well as the importance of adherence with medications and regular follow-up. The patient was given clear instructions to go to ER or return to medical center if symptoms don't improve, worsen or new problems develop. The patient verbalized understanding.   Follow-up: Return for Physical ONLY no labs.   Gildardo Pounds, FNP-BC [facility] [place], [place]   07/15/2019, 11:10 AM

## 2019-07-16 LAB — CMP14+EGFR
ALT: 15 IU/L (ref 0–32)
AST: 7 IU/L (ref 0–40)
Albumin/Globulin Ratio: 1.3 (ref 1.2–2.2)
Albumin: 4 g/dL (ref 3.8–4.8)
Alkaline Phosphatase: 41 IU/L (ref 39–117)
BUN/Creatinine Ratio: 27 (ref 12–28)
BUN: 31 mg/dL — ABNORMAL HIGH (ref 8–27)
Bilirubin Total: 0.3 mg/dL (ref 0.0–1.2)
CO2: 25 mmol/L (ref 20–29)
Calcium: 9.6 mg/dL (ref 8.7–10.3)
Chloride: 103 mmol/L (ref 96–106)
Creatinine, Ser: 1.14 mg/dL — ABNORMAL HIGH (ref 0.57–1.00)
GFR calc Af Amer: 57 mL/min/{1.73_m2} — ABNORMAL LOW (ref >59)
GFR calc non Af Amer: 50 mL/min/{1.73_m2} — ABNORMAL LOW (ref >59)
Globulin, Total: 3.1 g/dL (ref 1.5–4.5)
Glucose: 147 mg/dL — ABNORMAL HIGH (ref 65–99)
Potassium: 4.4 mmol/L (ref 3.5–5.2)
Sodium: 142 mmol/L (ref 134–144)
Total Protein: 7.1 g/dL (ref 6.0–8.5)

## 2019-07-16 LAB — CBC
Hematocrit: 35.8 % (ref 34.0–46.6)
Hemoglobin: 11.9 g/dL (ref 11.1–15.9)
MCH: 30.1 pg (ref 26.6–33.0)
MCHC: 33.2 g/dL (ref 31.5–35.7)
MCV: 90 fL (ref 79–97)
Platelets: 269 10*3/uL (ref 150–450)
RBC: 3.96 x10E6/uL (ref 3.77–5.28)
RDW: 13.7 % (ref 11.7–15.4)
WBC: 8.7 10*3/uL (ref 3.4–10.8)

## 2019-07-16 LAB — LIPID PANEL
Chol/HDL Ratio: 4.9 ratio — ABNORMAL HIGH (ref 0.0–4.4)
Cholesterol, Total: 224 mg/dL — ABNORMAL HIGH (ref 100–199)
HDL: 46 mg/dL (ref >39)
LDL Chol Calc (NIH): 151 mg/dL — ABNORMAL HIGH (ref 0–99)
Triglycerides: 151 mg/dL — ABNORMAL HIGH (ref 0–149)
VLDL Cholesterol Cal: 27 mg/dL (ref 5–40)

## 2019-08-13 MED FILL — TRUEplus LANCETS 28G MISC: 25 days supply | Qty: 100 | Fill #1

## 2019-08-13 MED FILL — TRUE METRIX TEST STRIP: 25 days supply | Qty: 100 | Fill #5

## 2019-08-27 ENCOUNTER — Encounter: Payer: Self-pay | Admitting: Nurse Practitioner

## 2019-09-16 MED FILL — TRUEplus LANCETS 28G MISC: 25 days supply | Qty: 100 | Fill #2

## 2019-09-16 MED FILL — TRUE METRIX TEST STRIP: 25 days supply | Qty: 100 | Fill #6

## 2019-10-24 MED FILL — TRUE METRIX TEST STRIP: 25 days supply | Qty: 100 | Fill #7

## 2019-10-24 MED FILL — TRUEplus LANCETS 28G MISC: 25 days supply | Qty: 100 | Fill #3

## 2019-11-18 ENCOUNTER — Other Ambulatory Visit: Payer: Self-pay | Admitting: Family Medicine

## 2019-11-18 ENCOUNTER — Other Ambulatory Visit: Payer: Self-pay | Admitting: Nurse Practitioner

## 2019-11-18 DIAGNOSIS — E1165 Type 2 diabetes mellitus with hyperglycemia: Secondary | ICD-10-CM

## 2019-11-18 MED FILL — TRUE METRIX TEST STRIP: 25 days supply | Qty: 100 | Fill #8

## 2019-11-19 MED FILL — TRUEplus LANCETS 28G MISC: 25 days supply | Qty: 100 | Fill #0

## 2019-12-08 ENCOUNTER — Other Ambulatory Visit: Payer: Self-pay | Admitting: Nurse Practitioner

## 2019-12-08 DIAGNOSIS — I10 Essential (primary) hypertension: Secondary | ICD-10-CM

## 2019-12-08 NOTE — Telephone Encounter (Signed)
Requested Prescriptions  Pending Prescriptions Disp Refills   hydrochlorothiazide (HYDRODIURIL) 25 MG tablet [Pharmacy Med Name: hydroCHLOROthiazide 25 MG Oral Tablet] 90 tablet 0    Sig: Take 1 tablet by mouth once daily     Cardiovascular: Diuretics - Thiazide Failed - 12/08/2019  4:55 PM      Failed - Cr in normal range and within 360 days    Creatinine, Ser  Date Value Ref Range Status  07/15/2019 1.14 (H) 0.57 - 1.00 mg/dL Final         Passed - Ca in normal range and within 360 days    Calcium  Date Value Ref Range Status  07/15/2019 9.6 8.7 - 10.3 mg/dL Final         Passed - K in normal range and within 360 days    Potassium  Date Value Ref Range Status  07/15/2019 4.4 3.5 - 5.2 mmol/L Final         Passed - Na in normal range and within 360 days    Sodium  Date Value Ref Range Status  07/15/2019 142 134 - 144 mmol/L Final         Passed - Last BP in normal range    BP Readings from Last 1 Encounters:  07/15/19 115/68         Passed - Valid encounter within last 6 months    Recent Outpatient Visits          4 months ago Uncontrolled type 2 diabetes mellitus with hyperglycemia (HCC)   [place] [facility] [place], Shea Stakes, NP   8 months ago Uncontrolled type 2 diabetes mellitus with hyperglycemia [facility])   [place] [facility] [place], Shea Stakes, NP   11 months ago Need for influenza vaccination   [facility] [place], Cornelius Moras, RPH-CPP   11 months ago Diabetes mellitus type 2, uncontrolled, with complications [facility])   [facility] [place], Shea Stakes, NP   12 months ago Encounter to establish care   [facility] [place], [place] W, NP              losartan (COZAAR) 50 MG tablet [Pharmacy Med Name: Losartan Potassium 50 MG Oral Tablet] 90 tablet 0    Sig: Take 1 tablet by mouth once daily     Cardiovascular:  Angiotensin Receptor  Blockers Failed - 12/08/2019  4:55 PM      Failed - Cr in normal range and within 180 days    Creatinine, Ser  Date Value Ref Range Status  07/15/2019 1.14 (H) 0.57 - 1.00 mg/dL Final         Passed - K in normal range and within 180 days    Potassium  Date Value Ref Range Status  07/15/2019 4.4 3.5 - 5.2 mmol/L Final         Passed - Patient is not pregnant      Passed - Last BP in normal range    BP Readings from Last 1 Encounters:  07/15/19 115/68         Passed - Valid encounter within last 6 months    Recent Outpatient Visits          4 months ago Uncontrolled type 2 diabetes mellitus with hyperglycemia [facility])   [place] [facility] [place], [place] W, NP   8 months ago Uncontrolled type 2 diabetes mellitus with hyperglycemia (HCC)  [facility] Claiborne Rigg, NP   11 months ago Need for influenza vaccination   [facility] [place], RPH-CPP   11 months ago Diabetes mellitus type 2, uncontrolled, with complications [facility])    [facility] Claiborne Rigg, NP   12 months ago Encounter to establish care   [place] [facility] [employer], Shea Stakes, NP

## 2019-12-11 ENCOUNTER — Encounter: Payer: Self-pay | Admitting: Nurse Practitioner

## 2019-12-12 ENCOUNTER — Other Ambulatory Visit: Payer: Self-pay | Admitting: Nurse Practitioner

## 2019-12-12 DIAGNOSIS — E785 Hyperlipidemia, unspecified: Secondary | ICD-10-CM

## 2019-12-12 DIAGNOSIS — E1165 Type 2 diabetes mellitus with hyperglycemia: Secondary | ICD-10-CM

## 2019-12-12 MED ORDER — METFORMIN HCL 500 MG PO TABS: 500.0000 mg | ORAL_TABLET | Freq: Two times a day (BID) | ORAL | 1 refills | Status: DC

## 2019-12-12 MED ORDER — PRAVASTATIN SODIUM 40 MG PO TABS: 40.0000 mg | ORAL_TABLET | Freq: Every day | ORAL | 3 refills | Status: DC

## 2020-02-03 ENCOUNTER — Encounter: Payer: Self-pay | Admitting: Nurse Practitioner

## 2020-02-04 ENCOUNTER — Other Ambulatory Visit: Payer: Self-pay | Admitting: Pharmacist

## 2020-02-04 DIAGNOSIS — E1165 Type 2 diabetes mellitus with hyperglycemia: Secondary | ICD-10-CM

## 2020-02-04 MED ORDER — TRUE METRIX BLOOD GLUCOSE TEST VI STRP: ORAL_STRIP | 2 refills | Status: DC

## 2020-02-04 MED FILL — TRUE METRIX TEST STRIP: 100 days supply | Qty: 100 | Fill #0

## 2020-02-04 MED FILL — TRUEplus LANCETS 28G MISC: 25 days supply | Qty: 100 | Fill #1

## 2020-03-09 ENCOUNTER — Other Ambulatory Visit: Payer: Self-pay | Admitting: Nurse Practitioner

## 2020-03-09 DIAGNOSIS — I10 Essential (primary) hypertension: Secondary | ICD-10-CM

## 2020-03-09 DIAGNOSIS — F3341 Major depressive disorder, recurrent, in partial remission: Secondary | ICD-10-CM

## 2020-04-22 ENCOUNTER — Encounter: Payer: Self-pay | Admitting: Nurse Practitioner

## 2020-04-24 ENCOUNTER — Other Ambulatory Visit: Payer: Self-pay | Admitting: Nurse Practitioner

## 2020-04-24 DIAGNOSIS — E1165 Type 2 diabetes mellitus with hyperglycemia: Secondary | ICD-10-CM

## 2020-04-24 MED ORDER — TRUE METRIX BLOOD GLUCOSE TEST VI STRP: ORAL_STRIP | 2 refills | Status: DC

## 2020-04-24 MED ORDER — TRUEPLUS LANCETS 28G MISC: 3 refills | Status: DC

## 2020-04-28 MED FILL — TRUE METRIX GLUCOSE TEST ST: 100 days supply | Qty: 100 | Fill #1

## 2020-04-28 MED FILL — TRUEplus LANCETS 28G MISC: 25 days supply | Qty: 100 | Fill #2

## 2020-04-30 NOTE — Telephone Encounter (Signed)
Attempt to reach patient to schedule a lab appt. No answer and LVM for call back.

## 2020-06-01 ENCOUNTER — Telehealth: Payer: Self-pay | Admitting: Nurse Practitioner

## 2020-06-01 NOTE — Telephone Encounter (Signed)
Called patient using interpreter services and LVM advising patient her appointment for 06-23-20 due to the provider being out of the office and needs to be rescheduled. Advised patient to call back [phone] to schedule.

## 2020-06-23 ENCOUNTER — Ambulatory Visit: Payer: Self-pay | Admitting: Nurse Practitioner

## 2020-06-23 MED FILL — TRUEplus LANCETS 28G MISC: 25 days supply | Qty: 100 | Fill #3

## 2020-07-16 ENCOUNTER — Ambulatory Visit: Payer: Self-pay | Attending: Physician Assistant | Admitting: Physician Assistant

## 2020-07-16 ENCOUNTER — Other Ambulatory Visit: Payer: Self-pay

## 2020-07-16 ENCOUNTER — Encounter: Payer: Self-pay | Admitting: Physician Assistant

## 2020-07-16 VITALS — BP 140/78 | HR 65 | Ht 60.0 in | Wt 228.4 lb

## 2020-07-16 DIAGNOSIS — E785 Hyperlipidemia, unspecified: Secondary | ICD-10-CM

## 2020-07-16 DIAGNOSIS — R1013 Epigastric pain: Secondary | ICD-10-CM

## 2020-07-16 DIAGNOSIS — E1165 Type 2 diabetes mellitus with hyperglycemia: Secondary | ICD-10-CM

## 2020-07-16 DIAGNOSIS — F3341 Major depressive disorder, recurrent, in partial remission: Secondary | ICD-10-CM

## 2020-07-16 DIAGNOSIS — R21 Rash and other nonspecific skin eruption: Secondary | ICD-10-CM

## 2020-07-16 DIAGNOSIS — I10 Essential (primary) hypertension: Secondary | ICD-10-CM

## 2020-07-16 LAB — POCT GLYCOSYLATED HEMOGLOBIN (HGB A1C): HbA1c, POC (controlled diabetic range): 6.4 % (ref 0.0–7.0)

## 2020-07-16 LAB — GLUCOSE, POCT (MANUAL RESULT ENTRY): POC Glucose: 89 mg/dl (ref 70–99)

## 2020-07-16 MED ORDER — SERTRALINE HCL 50 MG PO TABS
50.0000 mg | ORAL_TABLET | Freq: Every day | ORAL | 0 refills | Status: DC
Start: 2020-07-16 — End: 2020-08-01

## 2020-07-16 MED ORDER — HYDROCHLOROTHIAZIDE 25 MG PO TABS: 25.0000 mg | ORAL_TABLET | Freq: Every day | ORAL | 0 refills | Status: DC

## 2020-07-16 MED ORDER — OMEPRAZOLE 20 MG PO CPDR: 20.0000 mg | DELAYED_RELEASE_CAPSULE | Freq: Every day | ORAL | 3 refills | Status: DC

## 2020-07-16 MED ORDER — PRAVASTATIN SODIUM 40 MG PO TABS
40.0000 mg | ORAL_TABLET | Freq: Every day | ORAL | 3 refills | Status: DC
Start: 2020-07-16 — End: 2020-10-11

## 2020-07-16 MED ORDER — LOSARTAN POTASSIUM 50 MG PO TABS: 50.0000 mg | ORAL_TABLET | Freq: Every day | ORAL | 0 refills | Status: DC

## 2020-07-16 MED ORDER — TRIAMCINOLONE ACETONIDE 0.1 % EX CREA: 1.0000 "application " | TOPICAL_CREAM | Freq: Two times a day (BID) | CUTANEOUS | 1 refills | Status: DC

## 2020-07-16 MED ORDER — METFORMIN HCL 500 MG PO TABS: 500.0000 mg | ORAL_TABLET | Freq: Two times a day (BID) | ORAL | 1 refills | Status: DC

## 2020-07-16 NOTE — Progress Notes (Signed)
Patient ID: Christina Clark, female   DOB: 11-24-1950, 70 y.o.   MRN: [id number]   Christina Clark, is a 70 y.o. female  EXB:[id number]  NUU:[id number]  DOB - 28-Sep-1950  Subjective:  Chief Complaint and HPI: Christina Clark is a 70 y.o. female here today for med RF.  Her daughter is translating.  Blood sugars at home from 90-150 depending on what she eats.  Sometimes gets "sour" stomach and midepigastric discomfort with meals.  Itching rash on abdomen after using some neosporin that is getting bigger after a couple of days.    Not painful.    Complaint with meds.  Doing well overall.    ROS:   Constitutional:  No f/c, No night sweats, No unexplained weight loss. EENT:  No vision changes, No blurry vision, No hearing changes. No mouth, throat, or ear problems.  Respiratory: No cough, No SOB Cardiac: No CP, no palpitations GI:  No abd pain, No N/V/D. See above GU: No Urinary s/sx Musculoskeletal: No joint pain Neuro: No headache, no dizziness, no motor weakness.  Skin: +rash Endocrine:  No polydipsia. No polyuria.  Psych: Denies SI/HI  No problems updated.  ALLERGIES: Allergies  Allergen Reactions  . Chocolate     Reports vertigo sx  . Coffee Bean Extract [Coffea Arabica]     PAST MEDICAL HISTORY: Past Medical History:  Diagnosis Date  . Depression    After husbands death, takes medication  . Diabetes mellitus without complication (HCC)   . Hypertension     MEDICATIONS AT HOME: Prior to Admission medications   Medication Sig Start Date End Date Taking? Authorizing Provider  azelastine (OPTIVAR) 0.05 % ophthalmic solution Place 1 drop into both eyes daily. 07/15/19  Yes Claiborne Rigg, NP  glucose blood (TRUE METRIX BLOOD GLUCOSE TEST) test strip Use as instructed. Check blood glucose level by fingerstick once per day. 04/24/20  Yes Claiborne Rigg, NP  omeprazole (PRILOSEC) 20 MG capsule Take 1 capsule (20 mg total) by mouth daily. 07/16/20  Yes  Georgian Co M, PA-C  triamcinolone (KENALOG) 0.1 % Apply 1 application topically 2 (two) times daily. Prn itching 07/16/20  Yes Anders Simmonds, PA-C  TRUEplus Lancets 28G MISC USE AS INSTRUCTED. 04/24/20  Yes Claiborne Rigg, NP  hydrochlorothiazide (HYDRODIURIL) 25 MG tablet Take 1 tablet (25 mg total) by mouth daily. Please make PCP appt. 07/16/20   Anders Simmonds, PA-C  losartan (COZAAR) 50 MG tablet Take 1 tablet (50 mg total) by mouth daily. 07/16/20   Anders Simmonds, PA-C  metFORMIN (GLUCOPHAGE) 500 MG tablet Take 1 tablet (500 mg total) by mouth 2 (two) times daily with a meal. 07/16/20   Shanta Hartner, Marzella Schlein, PA-C  pravastatin (PRAVACHOL) 40 MG tablet Take 1 tablet (40 mg total) by mouth daily. 07/16/20   Anders Simmonds, PA-C  sertraline (ZOLOFT) 50 MG tablet Take 1 tablet (50 mg total) by mouth daily. Please make PCP appt. 07/16/20   Anders Simmonds, PA-C     Objective:  EXAM:   Vitals:   07/16/20 1545  BP: 140/78  Pulse: 65  SpO2: 94%  Weight: 228 lb 6.4 oz (103.6 kg)  Height: 5' (1.524 m)    General appearance : A&OX3. NAD. Non-toxic-appearing HEENT: Atraumatic and Normocephalic.  PERRLA. EOM intact.  Chest/Lungs:  Breathing-non-labored, Good air entry bilaterally, breath sounds normal without rales, rhonchi, or wheezing  CVS: S1 S2 regular, no murmurs, gallops, rubs  Extremities: Bilateral Lower Ext shows no  edema, both legs are warm to touch with = pulse throughout Neurology:  CN II-XII grossly intact, Non focal.   Psych:  TP linear. J/I WNL. Normal speech. Appropriate eye contact and affect.  Skin:  Urticaria/hive type rash on abdomen Rash  Data Review Lab Results  Component Value Date   HGBA1C 6.4 07/16/2020   HGBA1C 6.6 (A) 07/15/2019   HGBA1C 6.7 (A) 01/11/2019     Assessment & Plan   1. Uncontrolled type 2 diabetes mellitus with hyperglycemia (HCC) Improved-continue to work on less sugar and carbohydrate intake - Glucose (CBG) - HgB A1c -  Comprehensive metabolic panel - CBC with Differential/Platelet - metFORMIN (GLUCOPHAGE) 500 MG tablet; Take 1 tablet (500 mg total) by mouth 2 (two) times daily with a meal.  Dispense: 180 tablet; Refill: 1 - TSH  2. Essential hypertension - Comprehensive metabolic panel - CBC with Differential/Platelet - TSH - hydrochlorothiazide (HYDRODIURIL) 25 MG tablet; Take 1 tablet (25 mg total) by mouth daily. Please make PCP appt.  Dispense: 30 tablet; Refill: 0 - losartan (COZAAR) 50 MG tablet; Take 1 tablet (50 mg total) by mouth daily.  Dispense: 30 tablet; Refill: 0  3. Dyslipidemia, goal LDL below 70 - Comprehensive metabolic panel - Lipid panel - pravastatin (PRAVACHOL) 40 MG tablet; Take 1 tablet (40 mg total) by mouth daily.  Dispense: 90 tablet; Refill: 3  4. Recurrent major depressive disorder, in partial remission (HCC) - sertraline (ZOLOFT) 50 MG tablet; Take 1 tablet (50 mg total) by mouth daily. Please make PCP appt.  Dispense: 30 tablet; Refill: 0  5. Rash Stop neosporin - triamcinolone (KENALOG) 0.1 %; Apply 1 application topically 2 (two) times daily. Prn itching  Dispense: 45 g; Refill: 1  6. Dyspepsia - omeprazole (PRILOSEC) 20 MG capsule; Take 1 capsule (20 mg total) by mouth daily.  Dispense: 30 capsule; Refill: 3  Patient have been counseled extensively about nutrition and exercise  Return in about 5 months (around 12/16/2020) for with PCP;  DM and htn.  The patient was given clear instructions to go to ER or return to medical center if symptoms don't improve, worsen or new problems develop. The patient verbalized understanding. The patient was told to call to get lab results if they haven't heard anything in the next week.     Georgian Co, PA-C [facility] and Wellness [place], [place] [phone]   07/16/2020, 4:00 PM

## 2020-07-17 ENCOUNTER — Telehealth: Payer: Self-pay | Admitting: Nurse Practitioner

## 2020-07-17 LAB — CBC WITH DIFFERENTIAL/PLATELET
Basophils Absolute: 0.1 10*3/uL (ref 0.0–0.2)
Basos: 1 %
EOS (ABSOLUTE): 0.6 10*3/uL — ABNORMAL HIGH (ref 0.0–0.4)
Eos: 6 %
Hematocrit: 38 % (ref 34.0–46.6)
Hemoglobin: 12.5 g/dL (ref 11.1–15.9)
Immature Grans (Abs): 0 10*3/uL (ref 0.0–0.1)
Immature Granulocytes: 0 %
Lymphocytes Absolute: 3.3 10*3/uL — ABNORMAL HIGH (ref 0.7–3.1)
Lymphs: 34 %
MCH: 31.1 pg (ref 26.6–33.0)
MCHC: 32.9 g/dL (ref 31.5–35.7)
MCV: 95 fL (ref 79–97)
Monocytes Absolute: 1.2 10*3/uL — ABNORMAL HIGH (ref 0.1–0.9)
Monocytes: 12 %
Neutrophils Absolute: 4.5 10*3/uL (ref 1.4–7.0)
Neutrophils: 47 %
Platelets: 282 10*3/uL (ref 150–450)
RBC: 4.02 x10E6/uL (ref 3.77–5.28)
RDW: 13.9 % (ref 11.7–15.4)
WBC: 9.7 10*3/uL (ref 3.4–10.8)

## 2020-07-17 LAB — COMPREHENSIVE METABOLIC PANEL
ALT: 16 IU/L (ref 0–32)
AST: 13 IU/L (ref 0–40)
Albumin/Globulin Ratio: 1.3 (ref 1.2–2.2)
Albumin: 4.2 g/dL (ref 3.8–4.8)
Alkaline Phosphatase: 50 IU/L (ref 44–121)
BUN/Creatinine Ratio: 22 (ref 12–28)
BUN: 22 mg/dL (ref 8–27)
Bilirubin Total: 0.3 mg/dL (ref 0.0–1.2)
CO2: 23 mmol/L (ref 20–29)
Calcium: 10 mg/dL (ref 8.7–10.3)
Chloride: 103 mmol/L (ref 96–106)
Creatinine, Ser: 1 mg/dL (ref 0.57–1.00)
Globulin, Total: 3.3 g/dL (ref 1.5–4.5)
Glucose: 77 mg/dL (ref 65–99)
Potassium: 4.9 mmol/L (ref 3.5–5.2)
Sodium: 141 mmol/L (ref 134–144)
Total Protein: 7.5 g/dL (ref 6.0–8.5)
eGFR: 61 mL/min/{1.73_m2} (ref >59)

## 2020-07-17 LAB — LIPID PANEL
Chol/HDL Ratio: 5.3 ratio — ABNORMAL HIGH (ref 0.0–4.4)
Cholesterol, Total: 249 mg/dL — ABNORMAL HIGH (ref 100–199)
HDL: 47 mg/dL (ref >39)
LDL Chol Calc (NIH): 170 mg/dL — ABNORMAL HIGH (ref 0–99)
Triglycerides: 175 mg/dL — ABNORMAL HIGH (ref 0–149)
VLDL Cholesterol Cal: 32 mg/dL (ref 5–40)

## 2020-07-17 LAB — TSH: TSH: 3.9 u[IU]/mL (ref 0.450–4.500)

## 2020-07-17 NOTE — Telephone Encounter (Signed)
Caller name: Nirmal Relation to pt: from [facility]  Call back number: [phone]  Pharmacy: [facility] [address] [place]), [place] - [address] Phone:  [phone]  Fax:  [phone]       Reason for call:   Pharmacist would like to know if triamcinolone (KENALOG) 0.1 is ointment or cream, please advise

## 2020-07-17 NOTE — Telephone Encounter (Signed)
Pharmacy called to ask the nurse or doctor for clarification for a prescription they received for triamcinolone (KENALOG) 0.1 %.  They need to know if this is for a cream or lotion.  Please advise and call pharmacy to discuss at [phone]

## 2020-07-17 NOTE — Telephone Encounter (Signed)
Called and authorized the ointment.

## 2020-07-19 NOTE — Telephone Encounter (Signed)
Called pt made aware °

## 2020-07-22 ENCOUNTER — Other Ambulatory Visit: Payer: Self-pay | Admitting: Physician Assistant

## 2020-08-01 ENCOUNTER — Other Ambulatory Visit: Payer: Self-pay

## 2020-08-01 ENCOUNTER — Other Ambulatory Visit: Payer: Self-pay | Admitting: Physician Assistant

## 2020-08-01 DIAGNOSIS — I10 Essential (primary) hypertension: Secondary | ICD-10-CM

## 2020-08-01 DIAGNOSIS — F3341 Major depressive disorder, recurrent, in partial remission: Secondary | ICD-10-CM

## 2020-08-01 NOTE — Telephone Encounter (Signed)
Requested Prescriptions  Pending Prescriptions Disp Refills  . losartan (COZAAR) 50 MG tablet [Pharmacy Med Name: Losartan Potassium 50 MG Oral Tablet] 90 tablet 0    Sig: Take 1 tablet by mouth once daily     Cardiovascular:  Angiotensin Receptor Blockers Failed - 08/01/2020 11:26 AM      Failed - Last BP in normal range    BP Readings from Last 1 Encounters:  07/16/20 140/78         Passed - Cr in normal range and within 180 days    Creatinine, Ser  Date Value Ref Range Status  07/16/2020 1.00 0.57 - 1.00 mg/dL Final         Passed - K in normal range and within 180 days    Potassium  Date Value Ref Range Status  07/16/2020 4.9 3.5 - 5.2 mmol/L Final         Passed - Patient is not pregnant      Passed - Valid encounter within last 6 months    Recent Outpatient Visits          2 weeks ago Uncontrolled type 2 diabetes mellitus with hyperglycemia [facility])   [place] [facility] [place], [place], [place]   1 year ago Uncontrolled type 2 diabetes mellitus with hyperglycemia [facility])   [place] [facility] [place], [place] W, NP   1 year ago Uncontrolled type 2 diabetes mellitus with hyperglycemia [facility])   [place] [facility] Well[facility] [place], Shea Stakes, NP   1 year ago Need for influenza vaccination   [facility]ness [place] Chalet, RPH-CPP   1 year ago Diabetes mellitus type 2, uncontrolled, with complications [name])   [place] Claiborne Rigg, NP      Future Appointments            In 4 months Claiborne Rigg, NP [facility] And [facility]ness           . sertraline (ZOLOFT) 50 MG tablet [Pharmacy Med Name: Sertraline HCl 50 MG Oral Tablet] 90 tablet 0    Sig: TAKE 1 TABLET BY MOUTH ONCE DAILY . APPOINTMENT REQUIRED FOR FUTURE REFILLS     Psychiatry:  Antidepressants - SSRI Passed - 08/01/2020 11:26 AM      Passed - Completed PHQ-2 or PHQ-9 in the  last 360 days      Passed - Valid encounter within last 6 months    Recent Outpatient Visits          2 weeks ago Uncontrolled type 2 diabetes mellitus with hyperglycemia [facility])   [place] [facility] Wellness [place], [place], [place]   1 year ago Uncontrolled type 2 diabetes mellitus with hyperglycemia [facility])   [place] [facility] Wellness [place], [place] W, NP   1 year ago Uncontrolled type 2 diabetes mellitus with hyperglycemia [facility])   [place] [facility] [place], Shea Stakes, NP   1 year ago Need for influenza vaccination   [facility] And Wellness Drucilla Chalet, RPH-CPP   1 year ago Diabetes mellitus type 2, uncontrolled, with complications [facility])   [place] [facility] Claiborne Rigg, NP      Future Appointments            In 4 months Claiborne Rigg, NP [employer] [facility] [employer] And Wellness           .  hydrochlorothiazide (HYDRODIURIL) 25 MG tablet [Pharmacy Med Name: hydroCHLOROthiazide 25 MG Oral Tablet] 90 tablet 0    Sig: TAKE 1 TABLET BY MOUTH ONCE DAILY .  PLEASE  MAKE  AN  APPOINTMENT  WITH  PRIMARY  CARE  DR.     Cardiovascular: Diuretics - Thiazide Failed - 08/01/2020 11:26 AM      Failed - Last BP in normal range    BP Readings from Last 1 Encounters:  07/16/20 140/78         Passed - Ca in normal range and within 360 days    Calcium  Date Value Ref Range Status  07/16/2020 10.0 8.7 - 10.3 mg/dL Final         Passed - Cr in normal range and within 360 days    Creatinine, Ser  Date Value Ref Range Status  07/16/2020 1.00 0.57 - 1.00 mg/dL Final         Passed - K in normal range and within 360 days    Potassium  Date Value Ref Range Status  07/16/2020 4.9 3.5 - 5.2 mmol/L Final         Passed - Na in normal range and within 360 days    Sodium  Date Value Ref Range Status  07/16/2020 141 134 - 144 mmol/L Final         Passed - Valid encounter within last  6 months    Recent Outpatient Visits          2 weeks ago Uncontrolled type 2 diabetes mellitus with hyperglycemia [facility])   [place] [facility] And Wellness [place], [place], [place]   1 year ago Uncontrolled type 2 diabetes mellitus with hyperglycemia [facility])   [place] [facility] And Wellness [place], [place] W, NP   1 year ago Uncontrolled type 2 diabetes mellitus with hyperglycemia [facility])   [place] [facility] [place], Shea Stakes, NP   1 year ago Need for influenza vaccination   [facility]ness Drucilla Chalet, RPH-CPP   1 year ago Diabetes mellitus type 2, uncontrolled, with complications [facility])   [place] [facility] [place], Shea Stakes, NP      Future Appointments            In 4 months Claiborne Rigg, NP [employer] And Wellness

## 2020-08-10 ENCOUNTER — Encounter: Payer: Self-pay | Admitting: Physician Assistant

## 2020-10-11 ENCOUNTER — Other Ambulatory Visit: Payer: Self-pay | Admitting: Physician Assistant

## 2020-10-11 ENCOUNTER — Other Ambulatory Visit: Payer: Self-pay | Admitting: Nurse Practitioner

## 2020-10-11 DIAGNOSIS — F3341 Major depressive disorder, recurrent, in partial remission: Secondary | ICD-10-CM

## 2020-10-11 DIAGNOSIS — R1013 Epigastric pain: Secondary | ICD-10-CM

## 2020-10-11 DIAGNOSIS — I10 Essential (primary) hypertension: Secondary | ICD-10-CM

## 2020-10-11 DIAGNOSIS — E785 Hyperlipidemia, unspecified: Secondary | ICD-10-CM

## 2020-10-11 NOTE — Telephone Encounter (Signed)
Requested Prescriptions  Pending Prescriptions Disp Refills  . pravastatin (PRAVACHOL) 40 MG tablet [Pharmacy Med Name: Pravastatin Sodium 40 MG Oral Tablet] 90 tablet 2    Sig: Take 1 tablet by mouth once daily     Cardiovascular:  Antilipid - Statins Failed - 10/11/2020  5:38 PM      Failed - Total Cholesterol in normal range and within 360 days    Cholesterol, Total  Date Value Ref Range Status  07/16/2020 249 (H) 100 - 199 mg/dL Final         Failed - LDL in normal range and within 360 days    LDL Chol Calc (NIH)  Date Value Ref Range Status  07/16/2020 170 (H) 0 - 99 mg/dL Final         Failed - Triglycerides in normal range and within 360 days    Triglycerides  Date Value Ref Range Status  07/16/2020 175 (H) 0 - 149 mg/dL Final         Passed - HDL in normal range and within 360 days    HDL  Date Value Ref Range Status  07/16/2020 47 >39 mg/dL Final         Passed - Patient is not pregnant      Passed - Valid encounter within last 12 months    Recent Outpatient Visits          2 months ago Uncontrolled type 2 diabetes mellitus with hyperglycemia [facility])   [place] [facility]ness [place], [place], [place]   1 year ago Uncontrolled type 2 diabetes mellitus with hyperglycemia [facility])   [place] [facility]ness [place], [place] W, NP   1 year ago Uncontrolled type 2 diabetes mellitus with hyperglycemia [facility])   [place] [facility] Wellness [place], Shea Stakes, NP   1 year ago Need for influenza vaccination   [facility] Drucilla Chalet, RPH-CPP   1 year ago Diabetes mellitus type 2, uncontrolled, with complications [facility])   [place] [facility] Claiborne Rigg, NP      Future Appointments            In 2 months Claiborne Rigg, NP [employer] And Wellness           . hydrochlorothiazide (HYDRODIURIL) 25 MG tablet [Pharmacy Med Name:  hydroCHLOROthiazide 25 MG Oral Tablet] 90 tablet 0    Sig: Take 1 tablet by mouth once daily     Cardiovascular: Diuretics - Thiazide Failed - 10/11/2020  5:38 PM      Failed - Last BP in normal range    BP Readings from Last 1 Encounters:  07/16/20 140/78         Passed - Ca in normal range and within 360 days    Calcium  Date Value Ref Range Status  07/16/2020 10.0 8.7 - 10.3 mg/dL Final         Passed - Cr in normal range and within 360 days    Creatinine, Ser  Date Value Ref Range Status  07/16/2020 1.00 0.57 - 1.00 mg/dL Final         Passed - K in normal range and within 360 days    Potassium  Date Value Ref Range Status  07/16/2020 4.9 3.5 - 5.2 mmol/L Final         Passed - Na in normal range and within 360 days    Sodium  Date Value Ref Range Status  07/16/2020 141 134 - 144 mmol/L Final         Passed - Valid encounter within last 6 months    Recent Outpatient Visits          2 months ago Uncontrolled type 2 diabetes mellitus with hyperglycemia [facility])   [place] [facility] Wellness [place], [place], [place]   1 year ago Uncontrolled type 2 diabetes mellitus with hyperglycemia [facility])   [place] [facility] And Wellness [place], [place] W, NP   1 year ago Uncontrolled type 2 diabetes mellitus with hyperglycemia [facility])   [place] [facility] Wellness Claiborne Rigg, NP   1 year ago Need for influenza vaccination   [facility]ness Drucilla Chalet, RPH-CPP   1 year ago Diabetes mellitus type 2, uncontrolled, with complications [facility])   [place] And Wellness Claiborne Rigg, NP      Future Appointments            In 2 months Claiborne Rigg, NP [employer] And Wellness           . losartan (COZAAR) 50 MG tablet [Pharmacy Med Name: Losartan Potassium 50 MG Oral Tablet] 90 tablet 0    Sig: Take 1 tablet by mouth once daily     Cardiovascular:  Angiotensin  Receptor Blockers Failed - 10/11/2020  5:38 PM      Failed - Last BP in normal range    BP Readings from Last 1 Encounters:  07/16/20 140/78         Passed - Cr in normal range and within 180 days    Creatinine, Ser  Date Value Ref Range Status  07/16/2020 1.00 0.57 - 1.00 mg/dL Final         Passed - K in normal range and within 180 days    Potassium  Date Value Ref Range Status  07/16/2020 4.9 3.5 - 5.2 mmol/L Final         Passed - Patient is not pregnant      Passed - Valid encounter within last 6 months    Recent Outpatient Visits          2 months ago Uncontrolled type 2 diabetes mellitus with hyperglycemia [facility])   [place] Wellness [place], [place], [place]   1 year ago Uncontrolled type 2 diabetes mellitus with hyperglycemia [facility])   [place] [facility] Wellness [place], [place] W, NP   1 year ago Uncontrolled type 2 diabetes mellitus with hyperglycemia [facility])   [place] [facility] Wellness [place], Shea Stakes, NP   1 year ago Need for influenza vaccination   [facility] Wellness Drucilla Chalet, RPH-CPP   1 year ago Diabetes mellitus type 2, uncontrolled, with complications [facility])   [place] [facility] Claiborne Rigg, NP      Future Appointments            In 2 months Claiborne Rigg, NP [employer] [facility] Wellness           . sertraline (ZOLOFT) 50 MG tablet [Pharmacy Med Name: Sertraline HCl 50 MG Oral Tablet] 90 tablet 0    Sig: TAKE 1 TABLET BY MOUTH ONCE DAILY *APPOINTMENT REQUIRED FOR FUTURE REFILLS*     Psychiatry:  Antidepressants - SSRI Passed - 10/11/2020  5:38 PM  Passed - Completed PHQ-2 or PHQ-9 in the last 360 days      Passed - Valid encounter within last 6 months    Recent Outpatient Visits          2 months ago Uncontrolled type 2 diabetes mellitus with hyperglycemia [facility])   [place] [facility] Wellness [place],  [place], [place]   1 year ago Uncontrolled type 2 diabetes mellitus with hyperglycemia [facility])   [place] [facility] And Wellness [place], [place] W, NP   1 year ago Uncontrolled type 2 diabetes mellitus with hyperglycemia [facility])   [place] [facility] Wellness Claiborne Rigg, NP   1 year ago Need for influenza vaccination   [facility] Wellness Drucilla Chalet, RPH-CPP   1 year ago Diabetes mellitus type 2, uncontrolled, with complications [facility])   [place] [facility] And Wellness Claiborne Rigg, NP      Future Appointments            In 2 months Claiborne Rigg, NP [employer] And Wellness

## 2020-10-11 NOTE — Telephone Encounter (Signed)
Requested Prescriptions  Pending Prescriptions Disp Refills  . omeprazole (PRILOSEC) 20 MG capsule [Pharmacy Med Name: Omeprazole 20 MG Oral Capsule Delayed Release] 90 capsule 0    Sig: Take 1 capsule by mouth once daily     Gastroenterology: Proton Pump Inhibitors Passed - 10/11/2020  5:38 PM      Passed - Valid encounter within last 12 months    Recent Outpatient Visits          2 months ago Uncontrolled type 2 diabetes mellitus with hyperglycemia [facility])   [place] [facility] And Wellness [place], [place], [place]   1 year ago Uncontrolled type 2 diabetes mellitus with hyperglycemia [facility])   [place] [facility] And Wellness [place], [place] W, NP   1 year ago Uncontrolled type 2 diabetes mellitus with hyperglycemia [facility])   [facility] And Wellness [place], Shea Stakes, NP   1 year ago Need for influenza vaccination   [facility] And Wellness Drucilla Chalet, RPH-CPP   1 year ago Diabetes mellitus type 2, uncontrolled, with complications [facility])    [facility] Claiborne Rigg, NP      Future Appointments            In 2 months Claiborne Rigg, NP [facility] [employer] And Wellness

## 2020-12-16 ENCOUNTER — Other Ambulatory Visit: Payer: Self-pay

## 2020-12-16 ENCOUNTER — Ambulatory Visit: Payer: Self-pay | Attending: Nurse Practitioner | Admitting: Nurse Practitioner

## 2020-12-16 ENCOUNTER — Encounter: Payer: Self-pay | Admitting: Nurse Practitioner

## 2020-12-16 DIAGNOSIS — E1165 Type 2 diabetes mellitus with hyperglycemia: Secondary | ICD-10-CM

## 2020-12-16 DIAGNOSIS — R7989 Other specified abnormal findings of blood chemistry: Secondary | ICD-10-CM

## 2020-12-16 DIAGNOSIS — Z1159 Encounter for screening for other viral diseases: Secondary | ICD-10-CM

## 2020-12-16 DIAGNOSIS — I10 Essential (primary) hypertension: Secondary | ICD-10-CM

## 2020-12-16 DIAGNOSIS — E785 Hyperlipidemia, unspecified: Secondary | ICD-10-CM

## 2020-12-16 MED ORDER — TRUE METRIX BLOOD GLUCOSE TEST VI STRP
ORAL_STRIP | 2 refills | Status: DC
Start: 2020-12-16 — End: 2021-09-13
  Filled 2020-12-16: qty 100, 25d supply, fill #0
  Filled 2021-04-29: qty 100, 25d supply, fill #1
  Filled 2021-07-16: qty 100, 25d supply, fill #0

## 2020-12-16 MED ORDER — TRUEPLUS LANCETS 28G MISC
3 refills | Status: DC
  Filled 2020-12-16: qty 100, 25d supply, fill #0
  Filled 2021-04-29: qty 100, 25d supply, fill #1
  Filled 2021-07-16: qty 100, 25d supply, fill #0
  Filled 2021-09-13: qty 100, 25d supply, fill #1

## 2020-12-16 NOTE — Progress Notes (Signed)
Virtual Visit via Telephone Note Due to national recommendations of social distancing due to [place] 19, telehealth visit is felt to be most appropriate for this patient at this time.  I discussed the limitations, risks, security and privacy concerns of performing an evaluation and management service by telephone and the availability of in person appointments. I also discussed with the patient that there may be a patient responsible charge related to this service. The patient expressed understanding and agreed to proceed.    I connected with Christina Clark on 12/16/20  at   3:50 PM EDT  EDT by telephone and verified that I am speaking with the correct person using two identifiers.  Location of Patient: Private Residence   Location of Provider: [place] and [employer] Office    Persons participating in Telemedicine visit: Geryl Rankins FNP-BC Christina Clark    History of Present Illness: Telemedicine visit for: F/U DM She has a past medical history of Depression, DM2, Hyperlipidemia, and Hypertension.   DM 2 Well controlled. Monitoring blood glucose levels at home every morning fasting. Average Home readings: 95-105. Taking metformin 500 mg BID. LDL not at goal with taking pravastatin 40 mg daily. Denies any symptoms of hypo or hyperglycemia.  Lab Results  Component Value Date   HGBA1C 6.4 07/16/2020    Lab Results  Component Value Date   LDLCALC 170 (H) 07/16/2020     HTN Well controlled. She is taking HCTZ 25 mg daily, losartan 50 mg daily. Denies chest pain, shortness of breath, palpitations, lightheadedness, dizziness, headaches or BLE edema.   BP Readings from Last 3 Encounters:  07/16/20 140/78  07/15/19 115/68  01/11/19 135/71      Past Medical History:  Diagnosis Date   Depression    After husbands death, takes medication   Diabetes mellitus without complication ([place])    Hyperlipidemia    Hypertension     History reviewed. No pertinent  surgical history.  Family History  Problem Relation Age of Onset   Diabetes Neg Hx    Hypertension Neg Hx     Social History   Socioeconomic History   Marital status: Widowed    Spouse name: Not on file   Number of children: Not on file   Years of education: Not on file   Highest education level: Not on file  Occupational History   Not on file  Tobacco Use   Smoking status: Never   Smokeless tobacco: Never  Vaping Use   Vaping Use: Never used  Substance and Sexual Activity   Alcohol use: Not Currently   Drug use: Never   Sexual activity: Not Currently  Other Topics Concern   Not on file  Social History Narrative   Not on file   Social Determinants of Health   Financial Resource Strain: Not on file  Food Insecurity: Not on file  Transportation Needs: Not on file  Physical Activity: Not on file  Stress: Not on file  Social Connections: Not on file     Observations/Objective: Awake, alert and oriented x 3   Review of Systems  Constitutional:  Negative for fever, malaise/fatigue and weight loss.  HENT: Negative.  Negative for nosebleeds.   Eyes: Negative.  Negative for blurred vision, double vision and photophobia.  Respiratory: Negative.  Negative for cough and shortness of breath.   Cardiovascular: Negative.  Negative for chest pain, palpitations and leg swelling.  Gastrointestinal: Negative.  Negative for heartburn, nausea and vomiting.  Musculoskeletal: Negative.  Negative  for myalgias.  Neurological: Negative.  Negative for dizziness, focal weakness, seizures and headaches.  Psychiatric/Behavioral: Negative.  Negative for suicidal ideas.    Assessment and Plan: Diagnoses and all orders for this visit:  Uncontrolled type 2 diabetes mellitus with hyperglycemia ([place]) -     TRUEplus Lancets 28G MISC; USE AS INSTRUCTED. -     glucose blood (TRUE METRIX BLOOD GLUCOSE TEST) test strip; Use as instructed. Check blood glucose level by fingerstick once per day. -      CMP14+EGFR; Future -     Hemoglobin A1c; Future -     Lipid panel; Future Continue blood sugar control as discussed in office today, low carbohydrate diet, and regular physical exercise as tolerated, 150 minutes per week (30 min each day, 5 days per week, or 50 min 3 days per week). Keep blood sugar logs with fasting goal of 90-130 mg/dl, post prandial (after you eat) less than 180.  For Hypoglycemia: BS <60 and Hyperglycemia BS >400; contact the clinic ASAP. Annual eye exams and foot exams are recommended.   Dyslipidemia, goal LDL below 70 -     Lipid panel; Future INSTRUCTIONS: Work on a low fat, heart healthy diet and participate in regular aerobic exercise program by working out at least 150 minutes per week; 5 days a week-30 minutes per day. Avoid red meat/beef/steak,  fried foods. junk foods, sodas, sugary drinks, unhealthy snacking, alcohol and smoking.  Drink at least 80 oz of water per day and monitor your carbohydrate intake daily.    Primary hypertension Continue all antihypertensives as prescribed.  Remember to bring in your blood pressure log with you for your follow up appointment.  DASH/Mediterranean Diets are healthier choices for HTN.    Abnormal CBC -     CBC; Future  Need for hepatitis C screening test -     HCV Ab w Reflex to Quant PCR; Future    Follow Up Instructions Return in about 2 months (around 02/15/2021) for HTN.     I discussed the assessment and treatment plan with the patient. The patient was provided an opportunity to ask questions and all were answered. The patient agreed with the plan and demonstrated an understanding of the instructions.   The patient was advised to call back or seek an in-person evaluation if the symptoms worsen or if the condition fails to improve as anticipated.  I provided 10 minutes of non-face-to-face time during this encounter including median intraservice time, reviewing previous notes, labs, imaging, medications and  explaining diagnosis and management.  Gildardo Pounds, FNP-BC

## 2020-12-17 ENCOUNTER — Other Ambulatory Visit: Payer: Self-pay

## 2020-12-17 NOTE — Addendum Note (Signed)
Addended by: Nicanor Alcon on: 12/17/2020 09:47 AM   Modules accepted: Orders

## 2020-12-18 LAB — CMP14+EGFR
ALT: 24 IU/L (ref 0–32)
AST: 17 IU/L (ref 0–40)
Albumin/Globulin Ratio: 1.4 (ref 1.2–2.2)
Albumin: 4.2 g/dL (ref 3.8–4.8)
Alkaline Phosphatase: 51 IU/L (ref 44–121)
BUN/Creatinine Ratio: 24 (ref 12–28)
BUN: 26 mg/dL (ref 8–27)
Bilirubin Total: 0.3 mg/dL (ref 0.0–1.2)
CO2: 22 mmol/L (ref 20–29)
Calcium: 9.3 mg/dL (ref 8.7–10.3)
Chloride: 102 mmol/L (ref 96–106)
Creatinine, Ser: 1.09 mg/dL — ABNORMAL HIGH (ref 0.57–1.00)
Globulin, Total: 2.9 g/dL (ref 1.5–4.5)
Glucose: 126 mg/dL — ABNORMAL HIGH (ref 65–99)
Potassium: 4.6 mmol/L (ref 3.5–5.2)
Sodium: 140 mmol/L (ref 134–144)
Total Protein: 7.1 g/dL (ref 6.0–8.5)
eGFR: 55 mL/min/{1.73_m2} — ABNORMAL LOW (ref >59)

## 2020-12-18 LAB — LIPID PANEL
Chol/HDL Ratio: 4.6 ratio — ABNORMAL HIGH (ref 0.0–4.4)
Cholesterol, Total: 197 mg/dL (ref 100–199)
HDL: 43 mg/dL (ref >39)
LDL Chol Calc (NIH): 134 mg/dL — ABNORMAL HIGH (ref 0–99)
Triglycerides: 111 mg/dL (ref 0–149)
VLDL Cholesterol Cal: 20 mg/dL (ref 5–40)

## 2020-12-18 LAB — CBC
Hematocrit: 35.6 % (ref 34.0–46.6)
Hemoglobin: 11.5 g/dL (ref 11.1–15.9)
MCH: 31.1 pg (ref 26.6–33.0)
MCHC: 32.3 g/dL (ref 31.5–35.7)
MCV: 96 fL (ref 79–97)
Platelets: 289 10*3/uL (ref 150–450)
RBC: 3.7 x10E6/uL — ABNORMAL LOW (ref 3.77–5.28)
RDW: 14.7 % (ref 11.7–15.4)
WBC: 8.7 10*3/uL (ref 3.4–10.8)

## 2020-12-18 LAB — HEMOGLOBIN A1C
Est. average glucose Bld gHb Est-mCnc: 160 mg/dL
Hgb A1c MFr Bld: 7.2 % — ABNORMAL HIGH (ref 4.8–5.6)

## 2020-12-18 LAB — HCV AB W REFLEX TO QUANT PCR: HCV Ab: <0.1 s/co ratio (ref 0.0–0.9)

## 2020-12-18 LAB — HCV INTERPRETATION

## 2021-01-23 ENCOUNTER — Other Ambulatory Visit: Payer: Self-pay | Admitting: Nurse Practitioner

## 2021-01-23 ENCOUNTER — Other Ambulatory Visit: Payer: Self-pay | Admitting: Physician Assistant

## 2021-01-23 DIAGNOSIS — E1165 Type 2 diabetes mellitus with hyperglycemia: Secondary | ICD-10-CM

## 2021-01-23 DIAGNOSIS — F3341 Major depressive disorder, recurrent, in partial remission: Secondary | ICD-10-CM

## 2021-01-23 DIAGNOSIS — I10 Essential (primary) hypertension: Secondary | ICD-10-CM

## 2021-02-09 ENCOUNTER — Ambulatory Visit: Payer: Self-pay | Admitting: Nurse Practitioner

## 2021-03-30 IMAGING — DX PORTABLE CHEST - 1 VIEW
1 series · 1 of 1 positions shown · non-contrast
Comparison: None.

CLINICAL DATA: Fever and shortness of breath

EXAM:
PORTABLE CHEST 1 VIEW

[chest ap]
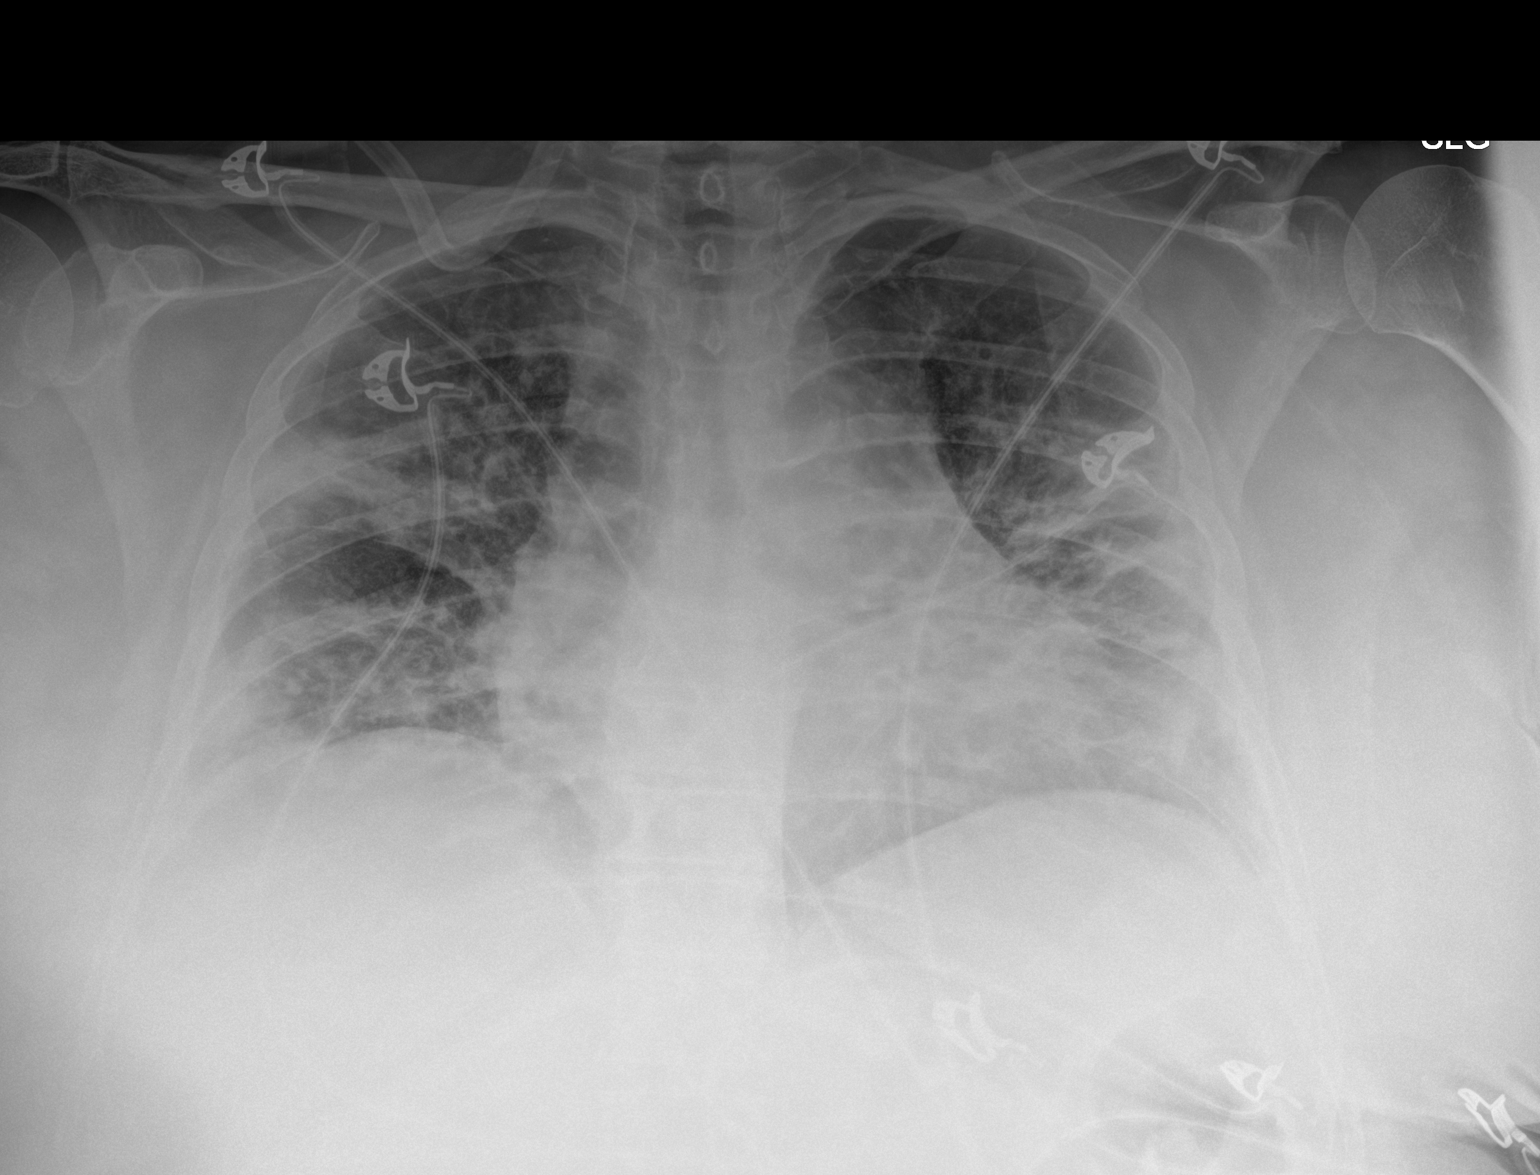

[1 of 1 positions shown; findings below may reference images not displayed]

FINDINGS: There are bilateral streaky airspace opacities, worst in the right
upper lobe. Cardiomediastinal contours are normal. No pneumothorax
or sizable pleural effusion.
IMPRESSION: Bilateral streaky airspace opacities which may indicate multifocal
infection.

## 2021-04-21 ENCOUNTER — Other Ambulatory Visit: Payer: Self-pay | Admitting: Nurse Practitioner

## 2021-04-21 ENCOUNTER — Other Ambulatory Visit: Payer: Self-pay | Admitting: Physician Assistant

## 2021-04-21 DIAGNOSIS — I10 Essential (primary) hypertension: Secondary | ICD-10-CM

## 2021-04-21 DIAGNOSIS — R1013 Epigastric pain: Secondary | ICD-10-CM

## 2021-04-21 DIAGNOSIS — E1165 Type 2 diabetes mellitus with hyperglycemia: Secondary | ICD-10-CM

## 2021-04-21 DIAGNOSIS — F3341 Major depressive disorder, recurrent, in partial remission: Secondary | ICD-10-CM

## 2021-04-21 NOTE — Telephone Encounter (Signed)
Requested Prescriptions  Pending Prescriptions Disp Refills   metFORMIN (GLUCOPHAGE) 500 MG tablet [Pharmacy Med Name: metFORMIN HCl 500 MG Oral Tablet] 180 tablet 0    Sig: TAKE 1 TABLET BY MOUTH TWICE DAILY WITH A MEAL     Endocrinology:  Diabetes - Biguanides Failed - 04/21/2021 11:20 AM      Failed - Cr in normal range and within 360 days    Creatinine, Ser  Date Value Ref Range Status  12/17/2020 1.09 (H) 0.57 - 1.00 mg/dL Final         Failed - eGFR in normal range and within 360 days    GFR calc Af Amer  Date Value Ref Range Status  07/15/2019 57 (L) >59 mL/min/1.73 Final   GFR calc non Af Amer  Date Value Ref Range Status  07/15/2019 50 (L) >59 mL/min/1.73 Final   eGFR  Date Value Ref Range Status  12/17/2020 55 (L) >59 mL/min/1.73 Final         Passed - HBA1C is between 0 and 7.9 and within 180 days    HbA1c, POC (controlled diabetic range)  Date Value Ref Range Status  07/16/2020 6.4 0.0 - 7.0 % Final   Hgb A1c MFr Bld  Date Value Ref Range Status  12/17/2020 7.2 (H) 4.8 - 5.6 % Final    Comment:             Prediabetes: 5.7 - 6.4          Diabetes: >6.4          Glycemic control for adults with diabetes: <7.0          Passed - Valid encounter within last 6 months    Recent Outpatient Visits          4 months ago Uncontrolled type 2 diabetes mellitus with hyperglycemia [facility])   [place], [place] W, NP   9 months ago Uncontrolled type 2 diabetes mellitus with hyperglycemia [facility])   [place], [place], [place]   1 year ago Uncontrolled type 2 diabetes mellitus with hyperglycemia [facility])   [place], [place] W, NP   2 years ago Uncontrolled type 2 diabetes mellitus with hyperglycemia [facility])   [place], Vernia Buff, NP   2 years ago Need for influenza vaccination   [place], Jarome [place]atin, RPH-CPP      Future Appointments            In 1 month Gildardo Pounds, NP [place]            omeprazole (PRILOSEC) 20 MG capsule [place] Med Name: Omeprazole 20 MG Oral Capsule Delayed Release] 90 capsule 0    Sig: Take 1 capsule by mouth once daily     Gastroenterology: Proton Pump Inhibitors Passed - 04/21/2021 11:20 AM      Passed - Valid encounter within last 12 months    Recent Outpatient Visits          4 months ago Uncontrolled type 2 diabetes mellitus with hyperglycemia [facility])   [place], [place] W, NP   9 months ago Uncontrolled type 2 diabetes mellitus with hyperglycemia [facility])   [place], [place], [place]   1 year ago Uncontrolled type 2 diabetes mellitus with hyperglycemia ([place])  [place], [place] W, NP   2 years ago Uncontrolled type 2 diabetes mellitus with hyperglycemia [facility])   [place] Gildardo Pounds, NP   2 years ago Need for influenza vaccination   [place], RPH-CPP      Future Appointments            In 1 month Gildardo Pounds, NP [place]

## 2021-04-21 NOTE — Telephone Encounter (Signed)
Requested Prescriptions  Pending Prescriptions Disp Refills   hydrochlorothiazide (HYDRODIURIL) 25 MG tablet [Pharmacy Med Name: hydroCHLOROthiazide 25 MG Oral Tablet] 90 tablet 0    Sig: Take 1 tablet by mouth once daily     Cardiovascular: Diuretics - Thiazide Failed - 04/21/2021 11:20 AM      Failed - Cr in normal range and within 360 days    Creatinine, Ser  Date Value Ref Range Status  12/17/2020 1.09 (H) 0.57 - 1.00 mg/dL Final         Failed - Last BP in normal range    BP Readings from Last 1 Encounters:  07/16/20 140/78         Passed - Ca in normal range and within 360 days    Calcium  Date Value Ref Range Status  12/17/2020 9.3 8.7 - 10.3 mg/dL Final         Passed - K in normal range and within 360 days    Potassium  Date Value Ref Range Status  12/17/2020 4.6 3.5 - 5.2 mmol/L Final         Passed - Na in normal range and within 360 days    Sodium  Date Value Ref Range Status  12/17/2020 140 134 - 144 mmol/L Final         Passed - Valid encounter within last 6 months    Recent Outpatient Visits          4 months ago Uncontrolled type 2 diabetes mellitus with hyperglycemia (HCC)   [place] [facility] And Wellness [place], Shea Stakes, NP   9 months ago Uncontrolled type 2 diabetes mellitus with hyperglycemia [facility])   [place] [facility] And Wellness [place], [place], [place]   1 year ago Uncontrolled type 2 diabetes mellitus with hyperglycemia [facility])   [place] [facility] And Wellness [place], [place] W, NP   2 years ago Uncontrolled type 2 diabetes mellitus with hyperglycemia [facility])   [place] [facility] And Wellness [place], [place] W, NP   2 years ago Need for influenza vaccination   [facility] And Wellness Drucilla Chalet, RPH-CPP      Future Appointments            In 1 month Claiborne Rigg, NP [place] [facility]            losartan (COZAAR) 50 MG tablet [Pharmacy Med  Name: Losartan Potassium 50 MG Oral Tablet] 90 tablet 0    Sig: Take 1 tablet by mouth once daily     Cardiovascular:  Angiotensin Receptor Blockers Failed - 04/21/2021 11:20 AM      Failed - Cr in normal range and within 180 days    Creatinine, Ser  Date Value Ref Range Status  12/17/2020 1.09 (H) 0.57 - 1.00 mg/dL Final         Failed - Last BP in normal range    BP Readings from Last 1 Encounters:  07/16/20 140/78         Passed - K in normal range and within 180 days    Potassium  Date Value Ref Range Status  12/17/2020 4.6 3.5 - 5.2 mmol/L Final         Passed - Patient is not pregnant      Passed - Valid encounter within last 6 months    Recent Outpatient Visits          4 months ago Uncontrolled type  2 diabetes mellitus with hyperglycemia [facility])   [place] [facility] And Wellness [place], [place] W, NP   9 months ago Uncontrolled type 2 diabetes mellitus with hyperglycemia [facility])   [facility] And Wellness [place], [place], [place]   1 year ago Uncontrolled type 2 diabetes mellitus with hyperglycemia [facility])   [place] [facility] And Wellness [place], [place] W, NP   2 years ago Uncontrolled type 2 diabetes mellitus with hyperglycemia [facility])   [place] [facility] And Wellness Claiborne Rigg, NP   2 years ago Need for influenza vaccination   [facility] Wellness [place], Cornelius Moras, RPH-CPP      Future Appointments            In 1 month Claiborne Rigg, NP [place] [facility]            sertraline (ZOLOFT) 50 MG tablet [Pharmacy Med Name: Sertraline HCl 50 MG Oral Tablet] 90 tablet 0    Sig: TAKE 1 TABLET BY MOUTH ONCE DAILY. APPOINTMENT NEEDED FOR FURTHER REFILLS.     Psychiatry:  Antidepressants - SSRI Passed - 04/21/2021 11:20 AM      Passed - Completed PHQ-2 or PHQ-9 in the last 360 days      Passed - Valid encounter within last 6 months    Recent Outpatient Visits           4 months ago Uncontrolled type 2 diabetes mellitus with hyperglycemia [facility])   [place] [facility] And Wellness [place], [place] W, NP   9 months ago Uncontrolled type 2 diabetes mellitus with hyperglycemia [facility])   [place] [facility] And Wellness [place], [place], [place]   1 year ago Uncontrolled type 2 diabetes mellitus with hyperglycemia [facility])   [place] [facility] And Wellness [place], [place] W, NP   2 years ago Uncontrolled type 2 diabetes mellitus with hyperglycemia [facility])   [place] [facility] And Wellness [place], Shea Stakes, NP   2 years ago Need for influenza vaccination   [facility] Wellness Lois Hux[place], Cornelius Moras, RPH-CPP      Future Appointments            In 1 month Claiborne Rigg, NP [facility] [employer] And Wellness

## 2021-04-29 ENCOUNTER — Other Ambulatory Visit: Payer: Self-pay

## 2021-06-14 ENCOUNTER — Ambulatory Visit: Payer: Self-pay | Attending: Nurse Practitioner | Admitting: Nurse Practitioner

## 2021-06-14 ENCOUNTER — Other Ambulatory Visit: Payer: Self-pay

## 2021-06-14 ENCOUNTER — Encounter: Payer: Self-pay | Admitting: Nurse Practitioner

## 2021-06-14 VITALS — BP 138/79 | HR 68 | Ht 60.0 in | Wt 224.4 lb

## 2021-06-14 DIAGNOSIS — I10 Essential (primary) hypertension: Secondary | ICD-10-CM

## 2021-06-14 DIAGNOSIS — E1165 Type 2 diabetes mellitus with hyperglycemia: Secondary | ICD-10-CM

## 2021-06-14 DIAGNOSIS — D72829 Elevated white blood cell count, unspecified: Secondary | ICD-10-CM

## 2021-06-14 DIAGNOSIS — Z1211 Encounter for screening for malignant neoplasm of colon: Secondary | ICD-10-CM

## 2021-06-14 DIAGNOSIS — Z13820 Encounter for screening for osteoporosis: Secondary | ICD-10-CM

## 2021-06-14 DIAGNOSIS — Z862 Personal history of diseases of the blood and blood-forming organs and certain disorders involving the immune mechanism: Secondary | ICD-10-CM

## 2021-06-14 DIAGNOSIS — Z23 Encounter for immunization: Secondary | ICD-10-CM

## 2021-06-14 DIAGNOSIS — R011 Cardiac murmur, unspecified: Secondary | ICD-10-CM

## 2021-06-14 DIAGNOSIS — Z1231 Encounter for screening mammogram for malignant neoplasm of breast: Secondary | ICD-10-CM

## 2021-06-14 DIAGNOSIS — E785 Hyperlipidemia, unspecified: Secondary | ICD-10-CM

## 2021-06-14 LAB — POCT GLYCOSYLATED HEMOGLOBIN (HGB A1C): Hemoglobin A1C: 6.6 % — AB (ref 4.0–5.6)

## 2021-06-14 LAB — GLUCOSE, POCT (MANUAL RESULT ENTRY): POC Glucose: 106 mg/dl — AB (ref 70–99)

## 2021-06-14 NOTE — Progress Notes (Signed)
Assessment & Plan:  Christina Clark was seen today for diabetes.  Diagnoses and all orders for this visit:  Uncontrolled type 2 diabetes mellitus with hyperglycemia (HCC) -     POCT glycosylated hemoglobin (Hb A1C) -     POCT glucose (manual entry) -     CMP14+EGFR  Primary hypertension Continue losartan and HCTZ as prescribed.  Remember to bring in your blood pressure log with you for your follow up appointment.  DASH/Mediterranean Diets are healthier choices for HTN.    Encounter for screening mammogram for malignant neoplasm of breast -     MM DIGITAL SCREENING BILATERAL; Future  Need for influenza vaccination -     Flu Vaccine QUAD 34moIM (Fluarix, Fluzone & Alfiuria Quad PF)  Colon cancer screening -     Fecal occult blood, imunochemical  Screening for osteoporosis -     DG Bone Density; Future  Dyslipidemia, goal LDL below 70 -     Lipid panel  History of anemia -     CBC  Newly recognized murmur -     Ambulatory referral to Cardiology    Patient has been counseled on age-appropriate routine health concerns for screening and prevention. These are reviewed and up-to-date. Referrals have been placed accordingly. Immunizations are up-to-date or declined.    Subjective:   Chief Complaint  Patient presents with   Diabetes   HPI Christina Clark 71y.o. female presents to office today for follow up to DM.  She is accompanied by her daughter who is providing interpreting assistance today.  She has a past medical history of Depression, DM2, Hyperlipidemia, and Hypertension.   DM 2 Well controlled with metformin 500 mg BID. LDL not at goal with pravastatin 40 mg daily.  Lab Results  Component Value Date   HGBA1C 6.6 (A) 06/14/2021   Lab Results  Component Value Date   LDLCALC 101 (H) 06/14/2021     HTN Blood pressure is well controlled with HCTZ 25 mg daily and losartan 50 mg daily. New murmur noted on auscultation today. She denies shortness of breath.  BP  Readings from Last 3 Encounters:  06/14/21 138/79  07/16/20 140/78  07/15/19 115/68    Review of Systems  Constitutional:  Negative for fever, malaise/fatigue and weight loss.  HENT: Negative.  Negative for nosebleeds.   Eyes: Negative.  Negative for blurred vision, double vision and photophobia.  Respiratory: Negative.  Negative for cough and shortness of breath.   Cardiovascular: Negative.  Negative for chest pain, palpitations and leg swelling.  Gastrointestinal: Negative.  Negative for heartburn, nausea and vomiting.  Musculoskeletal: Negative.  Negative for myalgias.  Neurological: Negative.  Negative for dizziness, focal weakness, seizures and headaches.  Psychiatric/Behavioral: Negative.  Negative for suicidal ideas.    Past Medical History:  Diagnosis Date   Depression    After husbands death, takes medication   Diabetes mellitus without complication ([place]    Hyperlipidemia    Hypertension     No past surgical history on file.  Family History  Problem Relation Age of Onset   Diabetes Neg Hx    Hypertension Neg Hx     Social History Reviewed with no changes to be made today.   Outpatient Medications Prior to Visit  Medication Sig Dispense Refill   azelastine (OPTIVAR) 0.05 % ophthalmic solution Place 1 drop into both eyes daily. 6 mL 12   glucose blood (TRUE METRIX BLOOD GLUCOSE TEST) test strip Use as instructed. Check blood glucose level by  fingerstick once per day. 100 each 2   hydrochlorothiazide (HYDRODIURIL) 25 MG tablet Take 1 tablet by mouth once daily 90 tablet 0   losartan (COZAAR) 50 MG tablet Take 1 tablet by mouth once daily 90 tablet 0   metFORMIN (GLUCOPHAGE) 500 MG tablet TAKE 1 TABLET BY MOUTH TWICE DAILY WITH A MEAL 180 tablet 0   omeprazole (PRILOSEC) 20 MG capsule Take 1 capsule by mouth once daily 90 capsule 0   pravastatin (PRAVACHOL) 40 MG tablet Take 1 tablet by mouth once daily 90 tablet 2   sertraline (ZOLOFT) 50 MG tablet TAKE 1 TABLET BY  MOUTH ONCE DAILY. APPOINTMENT NEEDED FOR FURTHER REFILLS. 90 tablet 0   triamcinolone (KENALOG) 0.1 % Apply 1 application topically 2 (two) times daily. Prn itching 45 g 1   TRUEplus Lancets 28G MISC USE AS INSTRUCTED. 100 each 3   No facility-administered medications prior to visit.    Allergies  Allergen Reactions   Chocolate     Reports vertigo sx   Coffee Bean Extract [Coffea Arabica]        Objective:    BP 138/79    Pulse 68    Ht 5' (1.524 m)    Wt 224 lb 6 oz (101.8 kg)    SpO2 95%    BMI 43.82 kg/m  Wt Readings from Last 3 Encounters:  06/14/21 224 lb 6 oz (101.8 kg)  07/16/20 228 lb 6.4 oz (103.6 kg)  07/15/19 230 lb (104.3 kg)    Physical Exam Vitals and nursing note reviewed.  Constitutional:      Appearance: She is well-developed.  HENT:     Head: Normocephalic and atraumatic.  Cardiovascular:     Rate and Rhythm: Normal rate and regular rhythm.     Heart sounds: Murmur heard.    No friction rub. No gallop.  Pulmonary:     Effort: Pulmonary effort is normal. No tachypnea or respiratory distress.     Breath sounds: Normal breath sounds. No decreased breath sounds, wheezing, rhonchi or rales.  Chest:     Chest wall: No tenderness.  Abdominal:     General: Bowel sounds are normal.     Palpations: Abdomen is soft.  Musculoskeletal:        General: Normal range of motion.     Cervical back: Normal range of motion.  Skin:    General: Skin is warm and dry.  Neurological:     Mental Status: She is alert and oriented to person, place, and time.     Coordination: Coordination normal.  Psychiatric:        Behavior: Behavior normal. Behavior is cooperative.        Thought Content: Thought content normal.        Judgment: Judgment normal.         Patient has been counseled extensively about nutrition and exercise as well as the importance of adherence with medications and regular follow-up. The patient was given clear instructions to go to ER or return to  medical center if symptoms don't improve, worsen or new problems develop. The patient verbalized understanding.   Follow-up: Return in about 3 months (around 09/11/2021) for NEEDS CAFA APPLICATION. See me in 3 months.   Gildardo Pounds, FNP-BC [facility], [place]   06/16/2021, 10:36 AM

## 2021-06-15 LAB — CMP14+EGFR
ALT: 23 IU/L (ref 0–32)
AST: 14 IU/L (ref 0–40)
Albumin/Globulin Ratio: 1.4 (ref 1.2–2.2)
Albumin: 4.2 g/dL (ref 3.8–4.8)
Alkaline Phosphatase: 52 IU/L (ref 44–121)
BUN/Creatinine Ratio: 26 (ref 12–28)
BUN: 29 mg/dL — ABNORMAL HIGH (ref 8–27)
Bilirubin Total: 0.2 mg/dL (ref 0.0–1.2)
CO2: 25 mmol/L (ref 20–29)
Calcium: 9.4 mg/dL (ref 8.7–10.3)
Chloride: 103 mmol/L (ref 96–106)
Creatinine, Ser: 1.1 mg/dL — ABNORMAL HIGH (ref 0.57–1.00)
Globulin, Total: 3 g/dL (ref 1.5–4.5)
Glucose: 76 mg/dL (ref 70–99)
Potassium: 4.6 mmol/L (ref 3.5–5.2)
Sodium: 142 mmol/L (ref 134–144)
Total Protein: 7.2 g/dL (ref 6.0–8.5)
eGFR: 54 mL/min/{1.73_m2} — ABNORMAL LOW (ref >59)

## 2021-06-15 LAB — CBC
Hematocrit: 32.3 % — ABNORMAL LOW (ref 34.0–46.6)
Hemoglobin: 11.1 g/dL (ref 11.1–15.9)
MCH: 33.5 pg — ABNORMAL HIGH (ref 26.6–33.0)
MCHC: 34.4 g/dL (ref 31.5–35.7)
MCV: 98 fL — ABNORMAL HIGH (ref 79–97)
Platelets: 277 10*3/uL (ref 150–450)
RBC: 3.31 x10E6/uL — ABNORMAL LOW (ref 3.77–5.28)
RDW: 13.7 % (ref 11.7–15.4)
WBC: 11.5 10*3/uL — ABNORMAL HIGH (ref 3.4–10.8)

## 2021-06-15 LAB — LIPID PANEL
Chol/HDL Ratio: 4.5 ratio — ABNORMAL HIGH (ref 0.0–4.4)
Cholesterol, Total: 185 mg/dL (ref 100–199)
HDL: 41 mg/dL
LDL Chol Calc (NIH): 101 mg/dL — ABNORMAL HIGH (ref 0–99)
Triglycerides: 254 mg/dL — ABNORMAL HIGH (ref 0–149)
VLDL Cholesterol Cal: 43 mg/dL — ABNORMAL HIGH (ref 5–40)

## 2021-06-16 ENCOUNTER — Encounter: Payer: Self-pay | Admitting: Nurse Practitioner

## 2021-06-16 MED ORDER — AZITHROMYCIN 250 MG PO TABS
ORAL_TABLET | ORAL | 0 refills | Status: AC
Start: 2021-06-16 — End: 2021-06-21

## 2021-06-17 ENCOUNTER — Telehealth: Payer: Self-pay

## 2021-06-17 ENCOUNTER — Telehealth: Payer: Self-pay | Admitting: Nurse Practitioner

## 2021-06-17 ENCOUNTER — Other Ambulatory Visit: Payer: Self-pay | Admitting: Nurse Practitioner

## 2021-06-17 DIAGNOSIS — R928 Other abnormal and inconclusive findings on diagnostic imaging of breast: Secondary | ICD-10-CM

## 2021-06-17 NOTE — Telephone Encounter (Signed)
Pt received a scholarship but the The order that was placed for the mammogram is not correct she needs an order that specifically says for diagnostics bilateral mammogram and an ultrasound of the left breast to confirm stability of probability bonine findings / please resubmit or advise

## 2021-06-17 NOTE — Telephone Encounter (Signed)
Called patient reviewed all information and repeated back to me. Will call if any questions.  With interpretor Vicente Males [phone] made an appt on in March

## 2021-06-18 NOTE — Telephone Encounter (Signed)
Pt daughter is calling back to follow up.   Requesting call back .   Needs interpreter in [place].

## 2021-06-21 NOTE — Telephone Encounter (Signed)
Provider already reorder imaging.

## 2021-06-24 ENCOUNTER — Other Ambulatory Visit: Payer: Self-pay

## 2021-06-24 DIAGNOSIS — R928 Other abnormal and inconclusive findings on diagnostic imaging of breast: Secondary | ICD-10-CM

## 2021-06-25 ENCOUNTER — Other Ambulatory Visit: Payer: Self-pay

## 2021-06-25 ENCOUNTER — Ambulatory Visit (INDEPENDENT_AMBULATORY_CARE_PROVIDER_SITE_OTHER): Payer: Self-pay | Admitting: Cardiovascular Disease

## 2021-06-25 ENCOUNTER — Encounter: Payer: Self-pay | Admitting: Cardiovascular Disease

## 2021-06-25 VITALS — BP 112/62 | HR 71 | Ht 64.0 in | Wt 226.0 lb

## 2021-06-25 DIAGNOSIS — R011 Cardiac murmur, unspecified: Secondary | ICD-10-CM

## 2021-06-25 DIAGNOSIS — E785 Hyperlipidemia, unspecified: Secondary | ICD-10-CM

## 2021-06-25 DIAGNOSIS — I10 Essential (primary) hypertension: Secondary | ICD-10-CM

## 2021-06-25 DIAGNOSIS — R0989 Other specified symptoms and signs involving the circulatory and respiratory systems: Secondary | ICD-10-CM

## 2021-06-25 NOTE — Progress Notes (Signed)
Cardiology Office Note   Date:  06/25/2021   ID:  Christina, Clark [birth year], MRN [id number]  PCP:  Claiborne Rigg, NP  Cardiologist:   Lorine Bears, MD   Chief Complaint  Patient presents with   Other    Heart murmur no complaints today. Meds reviewed verbally with pt.      History of Present Illness: Christina Clark is a 71 y.o. female who was referred by Bertram Denver for evaluation of a cardiac murmur. She has known history of left bundle branch block, obesity, type 2 diabetes, essential hypertension, hyperlipidemia and depression.  She is a lifelong non-smoker. She denies any chest pain, shortness of breath, palpitations or lower extremity edema.  She is not very active and does not exercise. She was recently found to have a cardiac murmur and thus she was referred for evaluation.  It appears that she has left bundle branch block since at least 2020. She has no history of congestive heart failure or stroke.    Past Medical History:  Diagnosis Date   Chronic kidney disease    Depression    After husbands death, takes medication   Diabetes mellitus without complication (HCC)    Hyperlipidemia    Hypertension     History reviewed. No pertinent surgical history.   Current Outpatient Medications  Medication Sig Dispense Refill   azelastine (OPTIVAR) 0.05 % ophthalmic solution Place 1 drop into both eyes daily. 6 mL 12   glucose blood (TRUE METRIX BLOOD GLUCOSE TEST) test strip Use as instructed. Check blood glucose level by fingerstick once per day. 100 each 2   hydrochlorothiazide (HYDRODIURIL) 25 MG tablet Take 1 tablet by mouth once daily 90 tablet 0   losartan (COZAAR) 50 MG tablet Take 1 tablet by mouth once daily 90 tablet 0   metFORMIN (GLUCOPHAGE) 500 MG tablet TAKE 1 TABLET BY MOUTH TWICE DAILY WITH A MEAL 180 tablet 0   omeprazole (PRILOSEC) 20 MG capsule Take 1 capsule by mouth once daily 90 capsule 0   pravastatin (PRAVACHOL) 40 MG  tablet Take 1 tablet by mouth once daily 90 tablet 2   sertraline (ZOLOFT) 50 MG tablet TAKE 1 TABLET BY MOUTH ONCE DAILY. APPOINTMENT NEEDED FOR FURTHER REFILLS. 90 tablet 0   triamcinolone (KENALOG) 0.1 % Apply 1 application topically 2 (two) times daily. Prn itching 45 g 1   TRUEplus Lancets 28G MISC USE AS INSTRUCTED. 100 each 3   No current facility-administered medications for this visit.    Allergies:   Chocolate and Coffee bean extract [coffea arabica]    Social History:  The patient  reports that she has never smoked. She has never used smokeless tobacco. She reports that she does not currently use alcohol. She reports that she does not use drugs.   Family History:  The patient's family history includes Heart attack in her brother.    ROS:  Please see the history of present illness.   Otherwise, review of systems are positive for none.   All other systems are reviewed and negative.    PHYSICAL EXAM: VS:  BP 112/62 (BP Location: Right Arm, Patient Position: Sitting, Cuff Size: Large)    Pulse 71    Ht 5\' 4"  (1.626 m)    Wt 226 lb (102.5 kg)    SpO2 97%    BMI 38.79 kg/m  , BMI Body mass index is 38.79 kg/m. GEN: Well nourished, well developed, in no acute distress  HEENT: normal  Neck: no JVD or masses.  Left carotid bruit Cardiac: RRR; no  rubs, or gallops,no edema .  2 out of 6 systolic murmur in the aortic and pulmonic area which is mid peaking with mildly diminished S2. Respiratory:  clear to auscultation bilaterally, normal work of breathing GI: soft, nontender, nondistended, + BS MS: no deformity or atrophy  Skin: warm and dry, no rash Neuro:  Strength and sensation are intact Psych: euthymic mood, full affect   EKG:  EKG is ordered today. The ekg ordered today demonstrates normal sinus rhythm with left bundle branch block.   Recent Labs: 07/16/2020: TSH 3.900 06/14/2021: ALT 23; BUN 29; Creatinine, Ser 1.10; Hemoglobin 11.1; Platelets 277; Potassium 4.6; Sodium  142    Lipid Panel    Component Value Date/Time   CHOL 185 06/14/2021 1450   TRIG 254 (H) 06/14/2021 1450   HDL 41 06/14/2021 1450   CHOLHDL 4.5 (H) 06/14/2021 1450   LDLCALC 101 (H) 06/14/2021 1450      Wt Readings from Last 3 Encounters:  06/25/21 226 lb (102.5 kg)  06/14/21 224 lb 6 oz (101.8 kg)  07/16/20 228 lb 6.4 oz (103.6 kg)       PAD Screen 06/25/2021  Previous PAD dx? No  Previous surgical procedure? No  Pain with walking? No  Feet/toe relief with dangling? No  Painful, non-healing ulcers? No  Extremities discolored? No      ASSESSMENT AND PLAN:  1.  Cardiac murmur: Not previously evaluated.  The murmur is suggestive of aortic stenosis of at least mild to moderate severity.  I requested an echocardiogram for evaluation.  She does not have cardiac symptoms but this might be deceiving given poor functional status overall.  2.  Left carotid bruit: No previous evaluation.  I requested carotid Doppler.  3.   Essential hypertension: Blood pressure is controlled on current medications.  4.  Hyperlipidemia: Currently on pravastatin.  I reviewed her recent lipid profile which showed an LDL of 101.  We should consider a more potent statin given suspected carotid disease and underlying diabetes.    Disposition:   FU after cardiac testing.  Signed,  Lorine Bears, MD  06/25/2021 2:11 PM    [facility]

## 2021-06-25 NOTE — Patient Instructions (Signed)
Medication Instructions:  Your physician recommends that you continue on your current medications as directed. Please refer to the Current Medication list given to you today.  *If you need a refill on your cardiac medications before your next appointment, please call your pharmacy*   Lab Work: None ordered If you have labs (blood work) drawn today and your tests are completely normal, you will receive your results only by: MyChart Message (if you have MyChart) OR A paper copy in the mail If you have any lab test that is abnormal or we need to change your treatment, we will call you to review the results.   Testing/Procedures: Your physician has requested that you have a carotid duplex. This test is an ultrasound of the carotid arteries in your neck. It looks at blood flow through these arteries that supply the brain with blood. Allow one hour for this exam. There are no restrictions or special instructions.   Your physician has requested that you have an echocardiogram. Echocardiography is a painless test that uses sound waves to create images of your heart. It provides your doctor with information about the size and shape of your heart and how well your hearts chambers and valves are working. This procedure takes approximately one hour. There are no restrictions for this procedure.    Follow-Up: At [facility], you and your health needs are our priority.  As part of our continuing mission to provide you with exceptional heart care, we have created designated Provider Care Teams.  These Care Teams include your primary Cardiologist (physician) and Advanced Practice Providers (APPs -  Physician Assistants and Nurse Practitioners) who all work together to provide you with the care you need, when you need it.  We recommend signing up for the patient portal called "MyChart".  Sign up information is provided on this After Visit Summary.  MyChart is used to connect with patients for Virtual  Visits (Telemedicine).  Patients are able to view lab/test results, encounter notes, upcoming appointments, etc.  Non-urgent messages can be sent to your provider as well.   To learn more about what you can do with MyChart, go to [email or web address].    Your next appointment:   4 week(s)  The format for your next appointment:   In Person  Provider:   You may see Lorine Bears, MD or one of the following Advanced Practice Providers on your designated Care Team:   Nicolasa Ducking, NP Eula Listen, PA-C Cadence Fransico Michael, PA-C}    Other Instructions  Ecocardiograma Echocardiogram Un ecocardiograma es una prueba que [place] ondas sonoras (ecografa) para obtener imgenes del corazn. Las imgenes de un ecocardiograma pueden proporcionar informacin importante sobre lo siguiente: Tamao y forma del corazn. El [place], el grosor y el movimiento de las paredes del Programmer, multimedia. La funcin y la fuerza del msculo cardaco. La funcin de la vlvula cardaca o si tiene estenosis. Se presenta estenosis cuando las vlvulas cardacas son demasiado estrechas. Si la sangre [employer] atrs a travs de las vlvulas cardacas (regurgitacin). Un tumor o crecimiento infeccioso alrededor de las vlvulas cardacas. reas del msculo cardaco que no funcionan bien debido a un flujo sanguneo deficiente o a una lesin a causa de un infarto de miocardio. Signos de aneurisma. Un aneurisma es una parte debilitada o daada en la pared de una arteria. La pared se abulta debido a la fuerza normal que produce el bombeo de la sangre a travs del cuerpo. Informe al mdico acerca de lo siguiente: Cualquier alergia que  tenga. Todos los [employer] [place], incluidos vitaminas, hierbas, gotas oftlmicas, cremas y [address] de [address]. Cualquier trastorno de la sangre que tenga. Cirugas a las que se haya sometido. Cualquier afeccin mdica que tenga. Si est embarazada o podra estarlo. Cules son los  riesgos? Por lo general, se trata de una prueba segura. Sin embargo, pueden presentarse problemas, como una reaccin alrgica al tinte Investment banker, operational) que se puede usar durante la prueba. Qu ocurre antes de esta prueba? No se requiere Lobbyist. Podr comer y beber normalmente. Qu ocurre durante la prueba?  Se quitar la ropa de la cintura para [place] y se pondr una bata de hospital. Es posible que le coloquen electrodos oparches de electrocardiograma (ECG) en el pecho. Luego los electrodos o parches se conectan a un dispositivo que controla su ritmo y frecuencia cardaca. Se acostar sobre una mesa para que le realicen una ecografa. Se le aplicar un gel en el pecho para ayudar a que las ondas sonoras pasen a travs de la piel. Se presionar un dispositivo de mano, llamado transductor, contra el pecho y se mover sobre su corazn. El transductor produce ondas sonoras que viajan hasta el corazn y rebotan (o producen "eco") Air cabin crew. Estas ondas sonoras se captarn en tiempo real y se transformarn en imgenes del corazn que se pueden ver en un monitor de vdeo. Las imgenes se grabarn en una computadora y sern revisadas por el mdico. Podrn solicitarle que cambie de posicin o que contenga la respiracin durante un lapso breve. Esto hace que sea ms fcil obtener diferentes vistas o mejores vistas del corazn. En algunos casos, puede recibir [place] a travs de una va intravenosa (i.v). en una de las venas. Esto puede mejorar la calidad de las imgenes del corazn. El procedimiento puede variar segn el mdico y el hospital. Qu puedo esperar despus de la prueba? Puede retomar su rutina diaria normal, incluidos la dieta, las actividades y [employer], a menos que su mdico le indique lo contrario. Siga estas instrucciones en su casa: Recoger los [employer] de la prueba es su responsabilidad. Consulte al mdico o pregunte en el departamento donde se  realiza la prueba cundo estarn [employer]. Cumpla con todas las visitas de seguimiento. Esto es importante. Resumen Un ecocardiograma es una prueba que [place] ondas sonoras (ecografa) para obtener imgenes del corazn. Las [employer] de un ecocardiograma pueden brindar informacin importante sobre el tamao y la forma del corazn, la funcin del msculo cardaco, la funcin de la vlvula cardaca y otros posibles problemas cardacos. No es necesario prepararse para esta prueba. Podr comer y beber normalmente. Al finalizar el ecocardiograma, puede retomar su rutina diaria normal, a menos que su mdico le indique lo contrario. Esta informacin no tiene Theme park manager el consejo del mdico. Asegrese de hacerle al mdico cualquier pregunta que tenga. Document Revised: 02/13/2020 Document Reviewed: 02/13/2020 Elsevier Patient Education  2022 [employer].

## 2021-06-28 ENCOUNTER — Other Ambulatory Visit: Payer: Self-pay

## 2021-06-28 ENCOUNTER — Ambulatory Visit: Payer: Self-pay | Attending: Nurse Practitioner

## 2021-06-29 ENCOUNTER — Other Ambulatory Visit: Payer: Self-pay

## 2021-06-29 DIAGNOSIS — N6320 Unspecified lump in the left breast, unspecified quadrant: Secondary | ICD-10-CM

## 2021-07-13 ENCOUNTER — Ambulatory Visit
Admission: RE | Admit: 2021-07-13 | Discharge: 2021-07-13 | Disposition: A | Discharge status: Home / Self Care | Payer: Self-pay | Source: Ambulatory Visit | Attending: Obstetrics and Gynecology | Admitting: Obstetrics and Gynecology

## 2021-07-13 ENCOUNTER — Other Ambulatory Visit: Payer: Self-pay

## 2021-07-13 ENCOUNTER — Ambulatory Visit
Admission: RE | Admit: 2021-07-13 | Discharge: 2021-07-13 | Disposition: A | Discharge status: Home / Self Care | Payer: No Typology Code available for payment source | Source: Ambulatory Visit | Attending: Obstetrics and Gynecology | Admitting: Obstetrics and Gynecology

## 2021-07-13 ENCOUNTER — Ambulatory Visit: Payer: Self-pay | Admitting: *Deleted

## 2021-07-13 VITALS — BP 128/70 | Wt 225.1 lb

## 2021-07-13 DIAGNOSIS — N6320 Unspecified lump in the left breast, unspecified quadrant: Secondary | ICD-10-CM

## 2021-07-13 DIAGNOSIS — Z1211 Encounter for screening for malignant neoplasm of colon: Secondary | ICD-10-CM

## 2021-07-13 DIAGNOSIS — Z1231 Encounter for screening mammogram for malignant neoplasm of breast: Secondary | ICD-10-CM

## 2021-07-13 DIAGNOSIS — Z1239 Encounter for other screening for malignant neoplasm of breast: Secondary | ICD-10-CM

## 2021-07-13 NOTE — Patient Instructions (Signed)
Explained breast self awareness with Christina Clark. Patient did not need a Pap smear today due to patient is over 18 with no history of an abnormal Pap smear. Recommended to patient to discuss with her PCP Pap smear recommendations due to being over 65 with no history of an abnormal Pap smear. [facility] to [facility] for a diagnostic mammogram per recommendation. Appointment scheduled Tuesday, July 13, 2021 at 1240. Patient aware of appointment and will be there. Christina Clark verbalized understanding. ? ?[name], [name], RN ?10:51 AM ? ? ? ? ?

## 2021-07-13 NOTE — Progress Notes (Signed)
Christina Clark is a 71 y.o. female who presents to [facility] clinic today with no complaints. Patient referred to [facility] by the [facility] due to patient had a screening mammogram completed 03/08/2019 that additional imaging of the left breast recommended for follow up. Left breast diagnostic mammogram and ultrasound was completed 03/20/2019 that was probably benign that 70-month follow up was recommended and not completed. ?  ?Pap Smear: Pap smear not completed today. Last Pap smear was 8 years ago and was normal per patient. Per patient has no history of an abnormal Pap smear. Last Pap smear result is not available in Epic. ?  ?Physical exam: ?Breasts ?Breasts symmetrical. No skin abnormalities bilateral breasts. No nipple retraction bilateral breasts. No nipple discharge bilateral breasts. No lymphadenopathy. No lumps palpated bilateral breasts. No complaints of pain or tenderness on exam.     ? ?MM DIAG BREAST TOMO UNI LEFT ? ?Result Date: 03/20/2019 ?CLINICAL DATA:  Patient presents today recall from screening for a possible left breast asymmetry. EXAM: DIGITAL DIAGNOSTIC LEFT MAMMOGRAM WITH TOMO ULTRASOUND LEFT BREAST COMPARISON:  Previous exam(s). ACR Breast Density Category b: There are scattered areas of fibroglandular density. FINDINGS: Mammogram: Additional spot compression and full field mL views were performed for the questioned asymmetry seen in the superior left breast only on the MLO view. On the additional imaging there is persistence of a 5 mm asymmetry which is best seen on the full field mL view. Ultrasound: Targeted ultrasound is performed in the upper-outer quadrant of the left breast at 2 o'clock 4 cm from the nipple demonstrating an oval circumscribed anechoic mass with internal echoes measuring 0.5 x 0.2 x 0.3 cm, which likely corresponds to the mammographic finding. Targeted ultrasound of the left axilla demonstrates no abnormal appearing lymph nodes. IMPRESSION: Left  breast probably benign asymmetry and corresponding 0.5 cm mass on ultrasound, likely representing a complicated cyst. RECOMMENDATION: Diagnostic left breast mammogram and ultrasound in 6 months to confirm stability of the probably benign findings. I have discussed the findings and recommendations with the patient. If applicable, a reminder letter will be sent to the patient regarding the next appointment. BI-RADS CATEGORY  3: Probably benign. Electronically Signed   By: Emmaline Kluver M.D.   On: 03/20/2019 16:13  ? ?MM 3D SCREEN BREAST BILATERAL ? ?Result Date: 03/11/2019 ?CLINICAL DATA:  Screening. EXAM: DIGITAL SCREENING BILATERAL MAMMOGRAM WITH TOMO AND CAD COMPARISON:  None. ACR Breast Density Category b: There are scattered areas of fibroglandular density. FINDINGS: In the left breast, a possible asymmetry warrants further evaluation. In the right breast, no findings suspicious for malignancy. Images were processed with CAD. IMPRESSION: Further evaluation is suggested for possible asymmetry in the left breast. RECOMMENDATION: Diagnostic mammogram and possibly ultrasound of the left breast. (Code:[id number]) The patient will be contacted regarding the findings, and additional imaging will be scheduled. BI-RADS CATEGORY  0: Incomplete. Need additional imaging evaluation and/or prior mammograms for comparison. Electronically Signed   By: Elberta Fortis M.D.   On: 03/11/2019 13:33   ? ?Pelvic/Bimanual ?Pap is not indicated today due to patient is over 65 with no history of an abnormal Pap smear. ?  ?Smoking History: ?Patient has never smoked. ?  ?Patient Navigation: ?Patient education provided. Access to services provided for patient through [place] program. Spanish interpreter Alene Mires from [facility] provided.  ? ?Colorectal Cancer Screening: ?Per patient has never had colonoscopy completed. FIT Test given to patient to complete. No complaints today.  ?  ?Breast and  Cervical Cancer Risk Assessment: ?Patient has family  history of her oldest sister having breast cancer. Patient has no known genetic mutations or history of radiation treatment to the chest before age 64. Patient does not have history of cervical dysplasia, immunocompromised, or DES exposure in-utero. ? ?Risk Assessment   ? ? Risk Scores   ? ?   07/13/2021  ? Last edited by: Narda Rutherford, LPN  ? 5-year risk: 2.3 %  ? Lifetime risk: 6.9 %  ? ?  ?  ? ?  ? ?A: ?BCCCP exam without pap smear ?No complaints. ? ?P: ?Referred patient to the [facility] for a diagnostic mammogram per recommendation. Appointment scheduled Tuesday, July 13, 2021 at 1240. ? ?Priscille Heidelberg, RN ?07/13/2021 10:51 AM   ?

## 2021-07-16 ENCOUNTER — Ambulatory Visit: Payer: Self-pay | Attending: Nurse Practitioner

## 2021-07-16 ENCOUNTER — Other Ambulatory Visit: Payer: Self-pay | Admitting: Physician Assistant

## 2021-07-16 ENCOUNTER — Other Ambulatory Visit: Payer: Self-pay | Admitting: Nurse Practitioner

## 2021-07-16 ENCOUNTER — Other Ambulatory Visit: Payer: Self-pay

## 2021-07-16 DIAGNOSIS — E785 Hyperlipidemia, unspecified: Secondary | ICD-10-CM

## 2021-07-16 DIAGNOSIS — I10 Essential (primary) hypertension: Secondary | ICD-10-CM

## 2021-07-16 DIAGNOSIS — R1013 Epigastric pain: Secondary | ICD-10-CM

## 2021-07-16 DIAGNOSIS — E1165 Type 2 diabetes mellitus with hyperglycemia: Secondary | ICD-10-CM

## 2021-07-16 DIAGNOSIS — F3341 Major depressive disorder, recurrent, in partial remission: Secondary | ICD-10-CM

## 2021-07-16 MED ORDER — OMEPRAZOLE 20 MG PO CPDR
20.0000 mg | DELAYED_RELEASE_CAPSULE | Freq: Every day | ORAL | 0 refills | Status: DC
  Filled 2021-07-16: qty 90, 90d supply, fill #0

## 2021-07-16 MED ORDER — PRAVASTATIN SODIUM 40 MG PO TABS
40.0000 mg | ORAL_TABLET | Freq: Every day | ORAL | 2 refills | Status: DC
  Filled 2021-07-16 – 2021-07-20 (×2): qty 90, 90d supply, fill #0

## 2021-07-16 MED ORDER — METFORMIN HCL 500 MG PO TABS
500.0000 mg | ORAL_TABLET | Freq: Two times a day (BID) | ORAL | 0 refills | Status: DC
  Filled 2021-07-16: qty 180, 90d supply, fill #0

## 2021-07-16 MED ORDER — LOSARTAN POTASSIUM 50 MG PO TABS
50.0000 mg | ORAL_TABLET | Freq: Every day | ORAL | 0 refills | Status: DC
  Filled 2021-07-16 – 2021-07-20 (×2): qty 90, 90d supply, fill #0

## 2021-07-16 MED ORDER — HYDROCHLOROTHIAZIDE 25 MG PO TABS
25.0000 mg | ORAL_TABLET | Freq: Every day | ORAL | 0 refills | Status: DC
  Filled 2021-07-16 – 2021-07-20 (×2): qty 90, 90d supply, fill #0

## 2021-07-16 NOTE — Telephone Encounter (Signed)
Requested Prescriptions  ?Pending Prescriptions Disp Refills  ?? pravastatin (PRAVACHOL) 40 MG tablet 90 tablet 2  ?  Sig: Take 1 tablet by mouth once daily  ?  ? Cardiovascular:  Antilipid - Statins Failed - 07/16/2021  2:35 PM  ?  ?  Failed - Lipid Panel in normal range within the last 12 months  ?  Cholesterol, Total  ?Date Value Ref Range Status  ?06/14/2021 185 100 - 199 mg/dL Final  ? ?LDL Chol Calc (NIH)  ?Date Value Ref Range Status  ?06/14/2021 101 (H) 0 - 99 mg/dL Final  ? ?HDL  ?Date Value Ref Range Status  ?06/14/2021 41 >39 mg/dL Final  ? ?Triglycerides  ?Date Value Ref Range Status  ?06/14/2021 254 (H) 0 - 149 mg/dL Final  ? ?  ?  ?  Passed - Patient is not pregnant  ?  ?  Passed - Valid encounter within last 12 months  ?  Recent Outpatient Visits   ?      ? 1 month ago Uncontrolled type 2 diabetes mellitus with hyperglycemia (HCC)  ? [facility] [place], [place]  ? 7 months ago Uncontrolled type 2 diabetes mellitus with hyperglycemia (HCC)  ? [facility] [place], [place]  ? 1 year ago Uncontrolled type 2 diabetes mellitus with hyperglycemia (HCC)  ? [facility] [place], [place], [place]  ? 2 years ago Uncontrolled type 2 diabetes mellitus with hyperglycemia (HCC)  ? [facility] [place], [place]  ? 2 years ago Uncontrolled type 2 diabetes mellitus with hyperglycemia (HCC)  ? [facility] [place], Zelda W, NP  ?  ?  ?Future Appointments   ?        ? In 1 week Furth, MicrosoftCadence H, PA-C [facility] [place], [facility]  ? In 1 month Cla[place]orne RiggF[place], Zelda W, NP [facility]  ?  ? ?  ?  ?  ?? hydrochlorothiazide (HYDRODIURIL) 25 MG tablet 90 tablet 0  ?  Sig: Take 1 tablet by mouth once daily  ?  ? Cardiovascular: Diuretics - Thiazide Failed - 07/16/2021  2:35 PM  ?  ?  Failed - Cr in normal range and within 180 days  ?   Creatinine, Ser  ?Date Value Ref Range Status  ?06/14/2021 1.10 (H) 0.57 - 1.00 mg/dL Final  ?   ?  ?  Passed - K in normal range and within 180 days  ?  Potassium  ?Date Value Ref Range Status  ?06/14/2021 4.6 3.5 - 5.2 mmol/L Final  ?   ?  ?  Passed - Na in normal range and within 180 days  ?  Sodium  ?Date Value Ref Range Status  ?06/14/2021 142 134 - 144 mmol/L Final  ?   ?  ?  Passed - Last BP in normal range  ?  BP Readings from Last 1 Encounters:  ?07/13/21 128/70  ?   ?  ?  Passed - Valid encounter within last 6 months  ?  Recent Outpatient Visits   ?      ? 1 month ago Uncontrolled type 2 diabetes mellitus with hyperglycemia (HCC)  ? [facility] [place], [place], [place]  ? 7 months ago Uncontrolled type 2 diabetes mellitus with hyperglycemia (HCC)  ? [facility] [place], Zelda W, NP  ?  1 year ago Uncontrolled type 2 diabetes mellitus with hyperglycemia (HCC)  ? [facility] [place], [place], [place]  ? 2 years ago Uncontrolled type 2 diabetes mellitus with hyperglycemia (HCC)  ? [facility] [place], [place] W, NP  ? 2 years ago Uncontrolled type 2 diabetes mellitus with hyperglycemia (HCC)  ? [facility] Cla[place] Rigg, NP  ?  ?  ?Future Appointments   ?        ? In 1 week Furth, [employer], PA-C [facility], [facility]  ? In 1 month Claiborne Rigg, NP [employer] [facility]  ?  ? ?  ?  ?  ?? losartan (COZAAR) 50 MG tablet 90 tablet 0  ?  Sig: Take 1 tablet by mouth once daily  ?  ? Cardiovascular:  Angiotensin Receptor Blockers Failed - 07/16/2021  2:35 PM  ?  ?  Failed - Cr in normal range and within 180 days  ?  Creatinine, Ser  ?Date Value Ref Range Status  ?06/14/2021 1.10 (H) 0.57 - 1.00 mg/dL Final  ?   ?  ?  Passed - K in normal range and within 180 days  ?  Potassium  ?Date Value Ref Range Status  ?06/14/2021 4.6 3.5 - 5.2  mmol/L Final  ?   ?  ?  Passed - Patient is not pregnant  ?  ?  Passed - Last BP in normal range  ?  BP Readings from Last 1 Encounters:  ?07/13/21 128/70  ?   ?  ?  Passed - Valid encounter within last 6 months  ?  Recent Outpatient Visits   ?      ? 1 month ago Uncontrolled type 2 diabetes mellitus with hyperglycemia (HCC)  ? [facility] [place], [place] W, NP  ? 7 months ago Uncontrolled type 2 diabetes mellitus with hyperglycemia (HCC)  ? [facility] [place], [place] W, NP  ? 1 year ago Uncontrolled type 2 diabetes mellitus with hyperglycemia (HCC)  ? [facility] [place], [place], [place]  ? 2 years ago Uncontrolled type 2 diabetes mellitus with hyperglycemia (HCC)  ? [facility] [place], [place] W, NP  ? 2 years ago Uncontrolled type 2 diabetes mellitus with hyperglycemia (HCC)  ? [facility] Claiborne Rigg, NP  ?  ?  ?Future Appointments   ?        ? In 1 week Furth, [employer], PA-C [facility], [facility]  ? In 1 month Claiborne Rigg, NP [employer] [facility]  ?  ? ?  ?  ?  ?? sertraline (ZOLOFT) 50 MG tablet 90 tablet 0  ?  Sig: TAKE 1 TABLET BY MOUTH ONCE DAILY. APPOINTMENT NEEDED FOR FURTHER REFILLS.  ?  ? Not Delegated - Psychiatry:  Antidepressants - SSRI - sertraline Failed - 07/16/2021  2:35 PM  ?  ?  Failed - This refill cannot be delegated  ?  ?  Passed - AST in normal range and within 360 days  ?  AST  ?Date Value Ref Range Status  ?06/14/2021 14 0 - 40 IU/L Final  ?   ?  ?  Passed - ALT in normal range and within 360 days  ?  ALT  ?Date Value Ref Range Status  ?06/14/2021  23 0 - 32 IU/L Final  ?   ?  ?  Passed - Completed PHQ-2 or PHQ-9 in the last 360 days  ?  ?  Passed - Valid encounter within last 6 months  ?  Recent Outpatient Visits   ?      ? 1 month ago Uncontrolled type 2 diabetes mellitus with hyperglycemia  (HCC)  ? [facility] Well[facility] [place], [place] W, NP  ? 7 months ago Uncontrolled type 2 diabetes mellitus with hyperglycemia (HCC)  ? [facility] Well[facility] [place], [place] W, NP  ? 1 year ago Uncontrolled type 2 diabetes mellitus with hyperglycemia (HCC)  ? [facility] And Wellness [place], [place], [place]  ? 2 years ago Uncontrolled type 2 diabetes mellitus with hyperglycemia (HCC)  ? [facility] And Wellness [place], [place] W, NP  ? 2 years ago Uncontrolled type 2 diabetes mellitus with hyperglycemia (HCC)  ? [facility] And Wellness Claiborne Rigg, NP  ?  ?  ?Future Appointments   ?        ? In 1 week Furth, [employer], PA-C [facility], [facility]  ? In 1 month Claiborne Rigg, NP [employer] [facility]  ?  ? ?  ?  ?  ? ?

## 2021-07-16 NOTE — Telephone Encounter (Signed)
Requested medication (s) are due for refill today - yes ? ?Requested medication (s) are on the active medication list -yes ? ?Future visit scheduled -yes ? ?Last refill: 04/21/21 #90 ? ?Notes to clinic: Request RF: non delegated Rx ? ?Requested Prescriptions  ?Pending Prescriptions Disp Refills  ? sertraline (ZOLOFT) 50 MG tablet 90 tablet 0  ?  Sig: TAKE 1 TABLET BY MOUTH ONCE DAILY. APPOINTMENT NEEDED FOR FURTHER REFILLS.  ?  ? Not Delegated - Psychiatry:  Antidepressants - SSRI - sertraline Failed - 07/16/2021  2:35 PM  ?  ?  Failed - This refill cannot be delegated  ?  ?  Passed - AST in normal range and within 360 days  ?  AST  ?Date Value Ref Range Status  ?06/14/2021 14 0 - 40 IU/L Final  ?  ?  ?  ?  Passed - ALT in normal range and within 360 days  ?  ALT  ?Date Value Ref Range Status  ?06/14/2021 23 0 - 32 IU/L Final  ?  ?  ?  ?  Passed - Completed PHQ-2 or PHQ-9 in the last 360 days  ?  ?  Passed - Valid encounter within last 6 months  ?  Recent Outpatient Visits   ? ?      ? 1 month ago Uncontrolled type 2 diabetes mellitus with hyperglycemia (HCC)  ? [facility] [place], [place] W, NP  ? 7 months ago Uncontrolled type 2 diabetes mellitus with hyperglycemia (HCC)  ? [facility] [place], [place] W, NP  ? 1 year ago Uncontrolled type 2 diabetes mellitus with hyperglycemia (HCC)  ? [facility] [place], [place], [place]  ? 2 years ago Uncontrolled type 2 diabetes mellitus with hyperglycemia (HCC)  ? [facility] And Wellness [place], [place] W, NP  ? 2 years ago Uncontrolled type 2 diabetes mellitus with hyperglycemia (HCC)  ? [facility] Claiborne Rigg, NP  ? ?  ?  ?Future Appointments   ? ?        ? In 1 week Furth, [employer], PA-C [facility], [facility]  ? In 1 month Claiborne Rigg, NP [employer] [facility]  ? ?  ? ?  ?  ?  ?Signed  Prescriptions Disp Refills  ? pravastatin (PRAVACHOL) 40 MG tablet 90 tablet 2  ?  Sig: Take 1 tablet by mouth once daily  ?  ? Cardiovascular:  Antilipid - Statins Failed - 07/16/2021  2:35 PM  ?  ?  Failed - Lipid Panel in normal range within the last 12 months  ?  Cholesterol, Total  ?Date Value Ref Range Status  ?06/14/2021 185 100 - 199 mg/dL Final  ? ?LDL Chol Calc (NIH)  ?Date Value Ref Range Status  ?06/14/2021 101 (H) 0 - 99 mg/dL Final  ? ?HDL  ?Date Value Ref Range Status  ?06/14/2021 41 >39 mg/dL Final  ? ?Triglycerides  ?Date Value Ref Range Status  ?06/14/2021 254 (H) 0 - 149 mg/dL Final  ? ?  ?  ?  Passed - Patient is not pregnant  ?  ?  Passed - Valid encounter within last 12 months  ?  Recent Outpatient Visits   ? ?      ? 1 month ago Uncontrolled type 2 diabetes mellitus with hyperglycemia (HCC)  ? [facility] [place], [place]  W, NP  ? 7 months ago Uncontrolled type 2 diabetes mellitus with hyperglycemia (HCC)  ? [facility] [place], [place], [place]  ? 1 year ago Uncontrolled type 2 diabetes mellitus with hyperglycemia (HCC)  ? [facility] [place], [place], [place]  ? 2 years ago Uncontrolled type 2 diabetes mellitus with hyperglycemia (HCC)  ? [facility] [place], [place], NP  ? 2 years ago Uncontrolled type 2 diabetes mellitus with hyperglycemia (HCC)  ? [facility] [place], Zelda W, NP  ? ?  ?  ?Future Appointments   ? ?        ? In 1 week Furth, MicrosoftCadence H, PA-C [facility] [place], [facility]  ? In 1 month Claiborne RiggFle[place], Zelda W, NP [facility]  ? ?  ? ?  ?  ?  ? hydrochlorothiazide (HYDRODIURIL) 25 MG tablet 90 tablet 0  ?  Sig: Take 1 tablet by mouth once daily  ?  ? Cardiovascular: Diuretics - Thiazide Failed - 07/16/2021  2:35 PM  ?  ?  Failed - Cr in normal range and within 180 days  ?  Creatinine, Ser  ?Date Value Ref  Range Status  ?06/14/2021 1.10 (H) 0.57 - 1.00 mg/dL Final  ?  ?  ?  ?  Passed - K in normal range and within 180 days  ?  Potassium  ?Date Value Ref Range Status  ?06/14/2021 4.6 3.5 - 5.2 mmol/L Final  ?  ?  ?  ?  Passed - Na in normal range and within 180 days  ?  Sodium  ?Date Value Ref Range Status  ?06/14/2021 142 134 - 144 mmol/L Final  ?  ?  ?  ?  Passed - Last BP in normal range  ?  BP Readings from Last 1 Encounters:  ?07/13/21 128/70  ?  ?  ?  ?  Passed - Valid encounter within last 6 months  ?  Recent Outpatient Visits   ? ?      ? 1 month ago Uncontrolled type 2 diabetes mellitus with hyperglycemia (HCC)  ? [facility] [place], [place], [place]  ? 7 months ago Uncontrolled type 2 diabetes mellitus with hyperglycemia (HCC)  ? [facility] [place], [place], [place]  ? 1 year ago Uncontrolled type 2 diabetes mellitus with hyperglycemia (HCC)  ? [facility] [place], [place], [place]  ? 2 years ago Uncontrolled type 2 diabetes mellitus with hyperglycemia (HCC)  ? [facility] [place], [place], [place]  ? 2 years ago Uncontrolled type 2 diabetes mellitus with hyperglycemia (HCC)  ? [facility] [place], Zelda W, NP  ? ?  ?  ?Future Appointments   ? ?        ? In 1 week Furth, MicrosoftCadence H, PA-C [facility] [place], [facility]  ? In 1 month Claiborne RiggF[place], Zelda W, NP [facility]  ? ?  ? ?  ?  ?  ? losartan (COZAAR) 50 MG tablet 90 tablet 0  ?  Sig: Take 1 tablet by mouth once daily  ?  ? Cardiovascular:  Angiotensin Receptor Blockers Failed - 07/16/2021  2:35 PM  ?  ?  Failed - Cr in normal range and within 180 days  ?  Creatinine, Ser  ?  Date Value Ref Range Status  ?06/14/2021 1.10 (H) 0.57 - 1.00 mg/dL Final  ?  ?  ?  ?  Passed - K in normal range and within 180 days  ?  Potassium  ?Date Value Ref Range Status  ?06/14/2021 4.6 3.5 - 5.2 mmol/L Final  ?   ?  ?  ?  Passed - Patient is not pregnant  ?  ?  Passed - Last BP in normal range  ?  BP Readings from Last 1 Encounters:  ?07/13/21 128/70  ?  ?  ?  ?  Passed - Valid encounter within last 6 months  ?  Recent Outpatient Visits   ? ?      ? 1 month ago Uncontrolled type 2 diabetes mellitus with hyperglycemia (HCC)  ? [facility] [place], [place] W, NP  ? 7 months ago Uncontrolled type 2 diabetes mellitus with hyperglycemia (HCC)  ? [facility] Well[facility] [place], [place] W, NP  ? 1 year ago Uncontrolled type 2 diabetes mellitus with hyperglycemia (HCC)  ? [facility] Well[facility] [place], [place], [place]  ? 2 years ago Uncontrolled type 2 diabetes mellitus with hyperglycemia (HCC)  ? [facility] [place], [place] W, NP  ? 2 years ago Uncontrolled type 2 diabetes mellitus with hyperglycemia (HCC)  ? [facility] Claiborne Rigg, NP  ? ?  ?  ?Future Appointments   ? ?        ? In 1 week Furth, [employer], PA-C [facility], [facility]  ? In 1 month Claiborne Rigg, NP [employer] [facility]  ? ?  ? ?  ?  ?  ? ? ? ?Requested Prescriptions  ?Pending Prescriptions Disp Refills  ? sertraline (ZOLOFT) 50 MG tablet 90 tablet 0  ?  Sig: TAKE 1 TABLET BY MOUTH ONCE DAILY. APPOINTMENT NEEDED FOR FURTHER REFILLS.  ?  ? Not Delegated - Psychiatry:  Antidepressants - SSRI - sertraline Failed - 07/16/2021  2:35 PM  ?  ?  Failed - This refill cannot be delegated  ?  ?  Passed - AST in normal range and within 360 days  ?  AST  ?Date Value Ref Range Status  ?06/14/2021 14 0 - 40 IU/L Final  ?  ?  ?  ?  Passed - ALT in normal range and within 360 days  ?  ALT  ?Date Value Ref Range Status  ?06/14/2021 23 0 - 32 IU/L Final  ?  ?  ?  ?  Passed - Completed PHQ-2 or PHQ-9 in the last 360 days  ?  ?  Passed - Valid encounter within last 6 months  ?  Recent Outpatient Visits   ? ?      ? 1  month ago Uncontrolled type 2 diabetes mellitus with hyperglycemia (HCC)  ? [facility] And Wellness [place], [place] W, NP  ? 7 months ago Uncontrolled type 2 diabetes mellitus with hyperglycemia

## 2021-07-16 NOTE — Telephone Encounter (Signed)
Requested Prescriptions  ?Pending Prescriptions Disp Refills  ?? metFORMIN (GLUCOPHAGE) 500 MG tablet 180 tablet 0  ?  Sig: TAKE 1 TABLET BY MOUTH TWICE DAILY WITH A MEAL  ?  ? Endocrinology:  Diabetes - Biguanides Failed - 07/16/2021  2:35 PM  ?  ?  Failed - Cr in normal range and within 360 days  ?  Creatinine, Ser  ?Date Value Ref Range Status  ?06/14/2021 1.10 (H) 0.57 - 1.00 mg/dL Final  ?   ?  ?  Failed - eGFR in normal range and within 360 days  ?  GFR calc Af Amer  ?Date Value Ref Range Status  ?07/15/2019 57 (L) >59 mL/min/1.73 Final  ? ?GFR calc non Af Amer  ?Date Value Ref Range Status  ?07/15/2019 50 (L) >59 mL/min/1.73 Final  ? ?eGFR  ?Date Value Ref Range Status  ?06/14/2021 54 (L) >59 mL/min/1.73 Final  ?   ?  ?  Failed - B12 Level in normal range and within 720 days  ?  No results found for: VITAMINB12   ?  ?  Failed - CBC within normal limits and completed in the last 12 months  ?  WBC  ?Date Value Ref Range Status  ?06/14/2021 11.5 (H) 3.4 - 10.8 x10E3/uL Final  ?11/14/2018 11.1 (H) 4.0 - 10.5 K/uL Final  ? ?RBC  ?Date Value Ref Range Status  ?06/14/2021 3.31 (L) 3.77 - 5.28 x10E6/uL Final  ?11/14/2018 3.48 (L) 3.87 - 5.11 MIL/uL Final  ? ?Hemoglobin  ?Date Value Ref Range Status  ?06/14/2021 11.1 11.1 - 15.9 g/dL Final  ? ?Hematocrit  ?Date Value Ref Range Status  ?06/14/2021 32.3 (L) 34.0 - 46.6 % Final  ? ?MCHC  ?Date Value Ref Range Status  ?06/14/2021 34.4 31.5 - 35.7 g/dL Final  ?11/14/2018 30.6 30.0 - 36.0 g/dL Final  ? ?MCH  ?Date Value Ref Range Status  ?06/14/2021 33.5 (H) 26.6 - 33.0 pg Final  ?11/14/2018 30.5 26.0 - 34.0 pg Final  ? ?MCV  ?Date Value Ref Range Status  ?06/14/2021 98 (H) 79 - 97 fL Final  ? ?No results found for: PLTCOUNTKUC, LABPLAT, [place] ?RDW  ?Date Value Ref Range Status  ?06/14/2021 13.7 11.7 - 15.4 % Final  ? ?  ?  ?  Passed - HBA1C is between 0 and 7.9 and within 180 days  ?  Hemoglobin A1C  ?Date Value Ref Range Status  ?06/14/2021 6.6 (A) 4.0 - 5.6 % Final   ? ?HbA1c, POC (controlled diabetic range)  ?Date Value Ref Range Status  ?07/16/2020 6.4 0.0 - 7.0 % Final  ? ?Hgb A1c MFr Bld  ?Date Value Ref Range Status  ?12/17/2020 7.2 (H) 4.8 - 5.6 % Final  ?  Comment:  ?           Prediabetes: 5.7 - 6.4 ?         Diabetes: >6.4 ?         Glycemic control for adults with diabetes: <7.0 ?  ?   ?  ?  Passed - Valid encounter within last 6 months  ?  Recent Outpatient Visits   ?      ? 1 month ago Uncontrolled type 2 diabetes mellitus with hyperglycemia ([place])  ? [name], [place] W, NP  ? 7 months ago Uncontrolled type 2 diabetes mellitus with hyperglycemia ([place])  ? [place], Vernia Buff, NP  ? 1 year ago Uncontrolled  type 2 diabetes mellitus with hyperglycemia ([place])  ? [place], [place], [place]  ? 2 years ago Uncontrolled type 2 diabetes mellitus with hyperglycemia ([place])  ? [place], [place] W, NP  ? 2 years ago Uncontrolled type 2 diabetes mellitus with hyperglycemia ([place])  ? [place] Gildardo Pounds, NP  ?  ?  ?Future Appointments   ?        ? In 1 week [place], [employer], PA-C [place], [facility]  ? In 1 month Gildardo Pounds, NP [place]  ?  ? ?  ?  ?  ?? omeprazole (PRILOSEC) 20 MG capsule 90 capsule 0  ?  Sig: Take 1 capsule by mouth once daily  ?  ? Gastroenterology: Proton Pump Inhibitors Passed - 07/16/2021  2:35 PM  ?  ?  Passed - Valid encounter within last 12 months  ?  Recent Outpatient Visits   ?      ? 1 month ago Uncontrolled type 2 diabetes mellitus with hyperglycemia ([place])  ? [place], [place] W, NP  ? 7 months ago Uncontrolled type 2 diabetes mellitus with hyperglycemia ([place])  ? [place], [place] W, NP  ? 1 year ago Uncontrolled type 2 diabetes  mellitus with hyperglycemia ([place])  ? [place], [place], [place]  ? 2 years ago Uncontrolled type 2 diabetes mellitus with hyperglycemia ([place])  ? [place], [place] W, NP  ? 2 years ago Uncontrolled type 2 diabetes mellitus with hyperglycemia ([place])  ? [place] Gildardo Pounds, NP  ?  ?  ?Future Appointments   ?        ? In 1 week [employer]th, [employer], PA-C [place], [facility]  ? In 1 month Gildardo Pounds, NP [place]  ?  ? ?  ?  ?  ? ? ?

## 2021-07-17 LAB — CBC WITH DIFFERENTIAL/PLATELET
Basophils Absolute: 0.1 10*3/uL (ref 0.0–0.2)
Basos: 1 %
EOS (ABSOLUTE): 0.2 10*3/uL (ref 0.0–0.4)
Eos: 2 %
Hematocrit: 32 % — ABNORMAL LOW (ref 34.0–46.6)
Hemoglobin: 10.7 g/dL — ABNORMAL LOW (ref 11.1–15.9)
Immature Grans (Abs): 0 10*3/uL (ref 0.0–0.1)
Immature Granulocytes: 0 %
Lymphocytes Absolute: 6 10*3/uL — ABNORMAL HIGH (ref 0.7–3.1)
Lymphs: 56 %
MCH: 32.8 pg (ref 26.6–33.0)
MCHC: 33.4 g/dL (ref 31.5–35.7)
MCV: 98 fL — ABNORMAL HIGH (ref 79–97)
Monocytes Absolute: 1.1 10*3/uL — ABNORMAL HIGH (ref 0.1–0.9)
Monocytes: 11 %
Neutrophils Absolute: 3.1 10*3/uL (ref 1.4–7.0)
Neutrophils: 30 %
Platelets: 277 10*3/uL (ref 150–450)
RBC: 3.26 x10E6/uL — ABNORMAL LOW (ref 3.77–5.28)
RDW: 13.7 % (ref 11.7–15.4)
WBC: 10.5 10*3/uL (ref 3.4–10.8)

## 2021-07-19 ENCOUNTER — Other Ambulatory Visit: Payer: Self-pay

## 2021-07-19 MED ORDER — SERTRALINE HCL 50 MG PO TABS
50.0000 mg | ORAL_TABLET | Freq: Every day | ORAL | 2 refills | Status: DC
  Filled 2021-07-19: qty 30, 30d supply, fill #0

## 2021-07-20 ENCOUNTER — Other Ambulatory Visit: Payer: Self-pay

## 2021-07-21 ENCOUNTER — Other Ambulatory Visit: Payer: Self-pay

## 2021-07-21 ENCOUNTER — Ambulatory Visit (INDEPENDENT_AMBULATORY_CARE_PROVIDER_SITE_OTHER): Payer: Self-pay

## 2021-07-21 DIAGNOSIS — R0989 Other specified symptoms and signs involving the circulatory and respiratory systems: Secondary | ICD-10-CM

## 2021-07-21 DIAGNOSIS — R011 Cardiac murmur, unspecified: Secondary | ICD-10-CM

## 2021-07-21 LAB — ECHOCARDIOGRAM COMPLETE
AR max vel: 1.91 cm2
AV Area VTI: 2.19 cm2
AV Area mean vel: 2.03 cm2
AV Mean grad: 10.5 mmHg
AV Peak grad: 20.3 mmHg
Ao pk vel: 2.26 m/s
Area-P 1/2: 3.47 cm2
Calc EF: 55.1 %
S' Lateral: 2.8 cm
Single Plane A2C EF: 53.4 %
Single Plane A4C EF: 53.1 %

## 2021-07-22 NOTE — Progress Notes (Signed)
?Cardiology Office Note:   ? ?Date:  07/23/2021  ? ?ID:  Christina Clark, DOB 01/09/1951, MRN [id number] ? ?PCP:  Gildardo Pounds, NP  ?[facility] Cardiologist:  None  ?[place] Electrophysiologist:  None  ? ?Referring MD: Gildardo Pounds, NP  ? ?Chief Complaint: F/u testing ? ?History of Present Illness:   ? ?Christina Clark is a 71 y.o. female with a hx of LBBB, obesity, DM2, HTN, HLD, depression who presents for follow-up.  ? ?She was noted to have a cardiac murmur and was referred for work-up. She has had a LBBB since 2020.  ? ?Last seen 06/25/21 and echo was ordered for murmur. Left carotid bruit was noted and carotid doppler was ordered. May need stronger statin with LDL 101.  ? ?Today, carotid US and echo were reviewed. The patient denies chest pain, SOB, LLE, orthopnea, pnd, palpitations. In general diet could be better, eats fried and fatty foods. She does no formal activity.  ? ?Past Medical History:  ?Diagnosis Date  ? Chronic kidney disease   ? Depression   ? After husbands death, takes medication  ? Diabetes mellitus without complication ([place])   ? Hyperlipidemia   ? Hypertension   ? ? ?History reviewed. No pertinent surgical history. ? ?Current Medications: ?Current Meds  ?Medication Sig  ? azelastine (OPTIVAR) 0.05 % ophthalmic solution Place 1 drop into both eyes daily.  ? glucose blood (TRUE METRIX BLOOD GLUCOSE TEST) test strip Use as instructed. Check blood glucose level by fingerstick once per day.  ? hydrochlorothiazide (HYDRODIURIL) 25 MG tablet Take 1 tablet by mouth once daily  ? losartan (COZAAR) 50 MG tablet Take 1 tablet by mouth once daily  ? metFORMIN (GLUCOPHAGE) 500 MG tablet TAKE 1 TABLET BY MOUTH TWICE DAILY WITH A MEAL  ? omeprazole (PRILOSEC) 20 MG capsule Take 1 capsule by mouth once daily  ? pravastatin (PRAVACHOL) 40 MG tablet Take 1 tablet by mouth once daily  ? sertraline (ZOLOFT) 50 MG tablet Take 1 tablet (50 mg total) by mouth daily.  ? triamcinolone  (KENALOG) 0.1 % Apply 1 application topically 2 (two) times daily. Prn itching  ? TRUEplus Lancets 28G MISC USE AS INSTRUCTED.  ?  ? ?Allergies:   Chocolate and Coffee bean extract [coffea arabica]  ? ?Social History  ? ?Socioeconomic History  ? Marital status: Widowed  ?  Spouse name: Not on file  ? Number of children: 4  ? Years of education: Not on file  ? Highest education level: High school graduate  ?Occupational History  ? Not on file  ?Tobacco Use  ? Smoking status: Never  ? Smokeless tobacco: Never  ?Vaping Use  ? Vaping Use: Never used  ?Substance and Sexual Activity  ? Alcohol use: Not Currently  ? Drug use: Never  ? Sexual activity: Not Currently  ?Other Topics Concern  ? Not on file  ?Social History Narrative  ? Not on file  ? ?Social Determinants of Health  ? ?Financial Resource Strain: Not on file  ?Food Insecurity: No Food Insecurity  ? Worried About Charity fundraiser in the Last Year: Never true  ? Ran Out of Food in the Last Year: Never true  ?Transportation Needs: No Transportation Needs  ? Lack of Transportation (Medical): No  ? Lack of Transportation (Non-Medical): No  ?Physical Activity: Not on file  ?Stress: Not on file  ?Social Connections: Not on file  ?  ? ?Family History: ?The patient's family  history includes Heart attack in her brother. There is no history of Diabetes or Hypertension. ? ?ROS:   ?Please see the history of present illness.    All other systems reviewed and are negative. ? ?EKGs/Labs/Other Studies Reviewed:   ? ?The following studies were reviewed today: ? ?US carotids 06/2021 ?Summary:  ?Right Carotid: There was no evidence of thrombus, dissection,  ?atherosclerotic  ?               plaque or stenosis in the cervical carotid system.  ? ?Left Carotid: There was no evidence of thrombus, dissection,  ?atherosclerotic  ?              plaque or stenosis in the cervical carotid system.  ? ?Vertebrals:  Bilateral vertebral arteries demonstrate antegrade flow.  ?Subclavians: Left  subclavian artery was stenotic. Normal flow hemodynamics  ?were  ?             seen in the right subclavian artery.  ? ?*See table(s) above for measurements and observations.  ?   ? ? ?Electronically signed by Larae Grooms MD on 07/21/2021 at 5:25:05 PM.  ? ?Echo 06/2021 ? ? 1. Left ventricular ejection fraction, by estimation, is 55 to 60%. Left  ?ventricular ejection fraction by 2D MOD biplane is 55.1 %. The left  ?ventricle has normal function. The left ventricle has no regional wall  ?motion abnormalities. Left ventricular  ?diastolic parameters are consistent with Grade II diastolic dysfunction  ?(pseudonormalization).  ? 2. Right ventricular systolic function is normal. The right ventricular  ?size is normal. There is moderately elevated pulmonary artery systolic  ?pressure. The estimated right ventricular systolic pressure is [id number] mmHg.  ? 3. Left atrial size was mildly dilated.  ? 4. The mitral valve is degenerative. Mild mitral valve regurgitation.  ? 5. Tricuspid valve regurgitation is moderate.  ? 6. Aortic valve mean gradient 29mmHg, Peak gradient 35mmHg, Vmax 2.75m/s.  ?The aortic valve is calcified. Aortic valve regurgitation is trivial. Mild  ?aortic valve stenosis.  ? 7. The inferior vena cava is normal in size with greater than 50%  ?respiratory variability, suggesting right atrial pressure of 3 mmHg.  ? ?EKG:  EKG is not ordered today.   ? ?Recent Labs: ?06/14/2021: ALT 23; BUN 29; Creatinine, Ser 1.10; Potassium 4.6; Sodium 142 ?07/16/2021: Hemoglobin 10.7; Platelets 277  ?Recent Lipid Panel ?   ?Component Value Date/Time  ? CHOL 185 06/14/2021 1450  ? TRIG 254 (H) 06/14/2021 1450  ? HDL 41 06/14/2021 1450  ? CHOLHDL 4.5 (H) 06/14/2021 1450  ? LDLCALC 101 (H) 06/14/2021 1450  ? ? ?Physical Exam:   ? ?VS:  BP 120/60 (BP Location: Left Arm, Patient Position: Sitting, Cuff Size: Large)   Pulse 84   Ht 5\' 4"  (1.626 m)   Wt 228 lb (103.4 kg)   SpO2 93%   BMI 39.14 kg/m?    ? ?Wt Readings from  Last 3 Encounters:  ?07/23/21 228 lb (103.4 kg)  ?07/13/21 225 lb 1.6 oz (102.1 kg)  ?06/25/21 226 lb (102.5 kg)  ?  ? ?GEN:  Well nourished, well developed in no acute distress ?HEENT: Normal ?NECK: No JVD; No carotid bruits ?LYMPHATICS: No lymphadenopathy ?CARDIAC: RRR, + murmur, no rubs, gallops ?RESPIRATORY:  Clear to auscultation without rales, wheezing or rhonchi  ?ABDOMEN: Soft, non-tender, non-distended ?MUSCULOSKELETAL:  No edema; No deformity  ?SKIN: Warm and dry ?NEUROLOGIC:  Alert and oriented x 3 ?PSYCHIATRIC:  Normal affect  ? ?ASSESSMENT:   ? ?  1. Murmur   ?2. Bruit of left carotid artery   ?3. Essential hypertension   ?4. Hyperlipidemia, unspecified hyperlipidemia type   ? ?PLAN:   ? ?In order of problems listed above: ? ?Cardiac murmur ?Echo showed normal LVEF 55-60%, no WMA, G2DD, normal RV function, G2DD, moderately elevated pulmonary artery pressure, Mild MR and mild AS.  She has no signs of acute heart failure.  Plan for repeat echo in 2 years.  ? ?Left carotid bruit ?US of the carotids showed no significant stenosis.  ? ?HTN ?BP good, continue current medications.  ? ?HLD ?LDL 101. She is on Pravastatin. Patient would like to pursue lifestyle changes, this was discussed at length today.  ? ?Disposition: Follow up in 1 year(s) with MD/APP  ? ? ? ? ?Signed, ?[place] Ninfa Meeker, PA-C  ?07/23/2021 12:05 PM    ?[place]  ?

## 2021-07-23 ENCOUNTER — Other Ambulatory Visit: Payer: Self-pay

## 2021-07-23 ENCOUNTER — Encounter: Payer: Self-pay | Admitting: Medical

## 2021-07-23 ENCOUNTER — Ambulatory Visit (INDEPENDENT_AMBULATORY_CARE_PROVIDER_SITE_OTHER): Payer: Self-pay | Admitting: Medical

## 2021-07-23 VITALS — BP 120/60 | HR 84 | Ht 64.0 in | Wt 228.0 lb

## 2021-07-23 DIAGNOSIS — R0989 Other specified symptoms and signs involving the circulatory and respiratory systems: Secondary | ICD-10-CM

## 2021-07-23 DIAGNOSIS — E785 Hyperlipidemia, unspecified: Secondary | ICD-10-CM

## 2021-07-23 DIAGNOSIS — I10 Essential (primary) hypertension: Secondary | ICD-10-CM

## 2021-07-23 DIAGNOSIS — R011 Cardiac murmur, unspecified: Secondary | ICD-10-CM

## 2021-07-23 NOTE — Patient Instructions (Signed)

## 2021-07-24 LAB — FECAL OCCULT BLOOD, IMMUNOCHEMICAL: Fecal Occult Bld: NEGATIVE

## 2021-07-28 ENCOUNTER — Telehealth: Payer: Self-pay

## 2021-07-28 NOTE — Telephone Encounter (Signed)
Via Lavon Paganini, Spanish Interpreter, [facility] Patient informed negative FIT test results, verbalized understanding.  ?

## 2021-08-03 ENCOUNTER — Ambulatory Visit
Admission: RE | Admit: 2021-08-03 | Discharge: 2021-08-03 | Disposition: A | Discharge status: Home / Self Care | Payer: No Typology Code available for payment source | Source: Ambulatory Visit | Attending: Nurse Practitioner | Admitting: Nurse Practitioner

## 2021-08-03 DIAGNOSIS — Z13820 Encounter for screening for osteoporosis: Secondary | ICD-10-CM

## 2021-09-13 ENCOUNTER — Ambulatory Visit: Payer: Self-pay | Attending: Nurse Practitioner | Admitting: Nurse Practitioner

## 2021-09-13 ENCOUNTER — Encounter: Payer: Self-pay | Admitting: Nurse Practitioner

## 2021-09-13 ENCOUNTER — Other Ambulatory Visit: Payer: Self-pay

## 2021-09-13 ENCOUNTER — Other Ambulatory Visit: Payer: Self-pay | Admitting: Nurse Practitioner

## 2021-09-13 VITALS — BP 126/74 | HR 62 | Temp 98.3°F | Ht 63.0 in | Wt 225.6 lb

## 2021-09-13 DIAGNOSIS — E1165 Type 2 diabetes mellitus with hyperglycemia: Secondary | ICD-10-CM

## 2021-09-13 DIAGNOSIS — E785 Hyperlipidemia, unspecified: Secondary | ICD-10-CM

## 2021-09-13 DIAGNOSIS — I10 Essential (primary) hypertension: Secondary | ICD-10-CM

## 2021-09-13 DIAGNOSIS — F3341 Major depressive disorder, recurrent, in partial remission: Secondary | ICD-10-CM

## 2021-09-13 DIAGNOSIS — D649 Anemia, unspecified: Secondary | ICD-10-CM

## 2021-09-13 DIAGNOSIS — R1013 Epigastric pain: Secondary | ICD-10-CM

## 2021-09-13 MED ORDER — LOSARTAN POTASSIUM 50 MG PO TABS: 50.0000 mg | ORAL_TABLET | Freq: Every day | ORAL | 1 refills | Status: DC

## 2021-09-13 MED ORDER — PRAVASTATIN SODIUM 40 MG PO TABS
40.0000 mg | ORAL_TABLET | Freq: Every day | ORAL | 2 refills | Status: DC
  Filled 2021-09-13 – 2021-10-18 (×2): qty 90, 90d supply, fill #0
  Filled 2021-12-08 – 2022-01-17 (×4): qty 90, 90d supply, fill #1
  Filled 2022-04-11: qty 90, 90d supply, fill #2

## 2021-09-13 MED ORDER — METFORMIN HCL 500 MG PO TABS: 500.0000 mg | ORAL_TABLET | Freq: Two times a day (BID) | ORAL | 1 refills | Status: DC

## 2021-09-13 MED ORDER — HYDROCHLOROTHIAZIDE 25 MG PO TABS
25.0000 mg | ORAL_TABLET | Freq: Every day | ORAL | 1 refills | Status: DC
  Filled 2021-09-13: qty 90, 90d supply, fill #0

## 2021-09-13 MED ORDER — OMEPRAZOLE 20 MG PO CPDR
20.0000 mg | DELAYED_RELEASE_CAPSULE | Freq: Every day | ORAL | 1 refills | Status: DC
  Filled 2021-09-13 – 2021-10-18 (×2): qty 90, 90d supply, fill #0
  Filled 2021-12-08 – 2022-01-17 (×4): qty 90, 90d supply, fill #1

## 2021-09-13 MED ORDER — SERTRALINE HCL 50 MG PO TABS
50.0000 mg | ORAL_TABLET | Freq: Every day | ORAL | 1 refills | Status: DC
  Filled 2021-09-13: qty 90, 90d supply, fill #0
  Filled 2021-12-08: qty 90, 90d supply, fill #1

## 2021-09-13 MED ORDER — SERTRALINE HCL 50 MG PO TABS: 50.0000 mg | ORAL_TABLET | Freq: Every day | ORAL | 1 refills | Status: DC

## 2021-09-13 MED ORDER — OMEPRAZOLE 20 MG PO CPDR: 20.0000 mg | DELAYED_RELEASE_CAPSULE | Freq: Every day | ORAL | 1 refills | Status: DC

## 2021-09-13 MED ORDER — METFORMIN HCL 500 MG PO TABS
500.0000 mg | ORAL_TABLET | Freq: Two times a day (BID) | ORAL | 1 refills | Status: DC
  Filled 2021-09-13 – 2021-10-18 (×2): qty 180, 90d supply, fill #0
  Filled 2021-12-08 – 2022-01-17 (×5): qty 180, 90d supply, fill #1

## 2021-09-13 MED ORDER — HYDROCHLOROTHIAZIDE 25 MG PO TABS: 25.0000 mg | ORAL_TABLET | Freq: Every day | ORAL | 1 refills | Status: DC

## 2021-09-13 MED ORDER — PRAVASTATIN SODIUM 40 MG PO TABS: 40.0000 mg | ORAL_TABLET | Freq: Every day | ORAL | 2 refills | Status: DC

## 2021-09-13 MED ORDER — LOSARTAN POTASSIUM 50 MG PO TABS
50.0000 mg | ORAL_TABLET | Freq: Every day | ORAL | 1 refills | Status: DC
  Filled 2021-09-13 – 2021-10-18 (×2): qty 90, 90d supply, fill #0
  Filled 2021-12-08 – 2022-01-17 (×5): qty 90, 90d supply, fill #1

## 2021-09-13 NOTE — Progress Notes (Signed)
? ?Assessment & Plan:  ?Christina Clark was seen today for hypertension. ? ?Diagnoses and all orders for this visit: ? ?Essential hypertension ?-     CMP14+EGFR ?-     Discontinue: hydrochlorothiazide (HYDRODIURIL) 25 MG tablet; Take 1 tablet (25 mg total) by mouth daily. Please fill as a 90 day supply ?-     Discontinue: losartan (COZAAR) 50 MG tablet; Take 1 tablet (50 mg total) by mouth daily. Please fill as a 90 day supply ?-     hydrochlorothiazide (HYDRODIURIL) 25 MG tablet; Take 1 tablet (25 mg total) by mouth daily. ?-     losartan (COZAAR) 50 MG tablet; Take 1 tablet (50 mg total) by mouth daily. ? ?Uncontrolled type 2 diabetes mellitus with hyperglycemia ([name]) ?-     CMP14+EGFR ?-     Discontinue: metFORMIN (GLUCOPHAGE) 500 MG tablet; Take 1 tablet (500 mg total) by mouth 2 (two) times daily with a meal. Please fill as a 90 day supply ?-     metFORMIN (GLUCOPHAGE) 500 MG tablet; Take 1 tablet (500 mg total) by mouth 2 (two) times daily with a meal. Please fill as a 90 day supply ?-     Hemoglobin A1c ? ?Dyslipidemia, goal LDL below 70 ?-     Lipid panel ?-     Discontinue: pravastatin (PRAVACHOL) 40 MG tablet; Take 1 tablet (40 mg total) by mouth daily. Please fill as a 90 day supply ?-     pravastatin (PRAVACHOL) 40 MG tablet; Take 1 tablet (40 mg total) by mouth daily. Please fill as a 90 day supply ? ?Recurrent major depressive disorder, in partial remission ([place]) ?-     Discontinue: sertraline (ZOLOFT) 50 MG tablet; Take 1 tablet (50 mg total) by mouth daily. Please fill as a 90 day supply ?-     sertraline (ZOLOFT) 50 MG tablet; Take 1 tablet (50 mg total) by mouth daily. Please fill as a 90 day supply ? ?Dyspepsia ?-     Discontinue: omeprazole (PRILOSEC) 20 MG capsule; Take 1 capsule (20 mg total) by mouth daily. Please fill as a 90 day supply ?-     omeprazole (PRILOSEC) 20 MG capsule; Take 1 capsule (20 mg total) by mouth daily. Please fill as a 90 day supply ? ?Anemia, unspecified type ?-     CBC with  Differential ? ? ? ?Patient has been counseled on age-appropriate routine health concerns for screening and prevention. These are reviewed and up-to-date. Referrals have been placed accordingly. Immunizations are up-to-date or declined.    ?Subjective:  ? ?Chief Complaint  ?Patient presents with  ? Hypertension  ? ?HPI ?Christina Clark 71 y.o. female presents to office for follow up to HTN and DM. ? ?HTN ?Blood pressure is well controlled with losartan 50 mg daily and HCTZ 25 mg daily. She does not monitor her blood pressure at home.  ?BP Readings from Last 3 Encounters:  ?09/13/21 126/74  ?07/23/21 120/60  ?07/13/21 128/70  ?  ?DM2 ?Diabetes is well controlled. She is taking meformin 500 mg BID. LDL not at goal. She is not dietary adherent but does endorse taking pravastatin 40 mg daily as prescribed.  ?Lab Results  ?Component Value Date  ? HGBA1C 6.9 (H) 09/13/2021  ?  ?Lab Results  ?Component Value Date  ? LDLCALC 140 (H) 09/13/2021  ?  ? ?Review of Systems  ?Constitutional:  Negative for fever, malaise/fatigue and weight loss.  ?HENT: Negative.  Negative for nosebleeds.   ?Eyes:  Negative.  Negative for blurred vision, double vision and photophobia.  ?Respiratory: Negative.  Negative for cough and shortness of breath.   ?Cardiovascular: Negative.  Negative for chest pain, palpitations and leg swelling.  ?Gastrointestinal:  Positive for heartburn. Negative for nausea and vomiting.  ?Musculoskeletal: Negative.  Negative for myalgias.  ?Neurological: Negative.  Negative for dizziness, focal weakness, seizures and headaches.  ?Psychiatric/Behavioral: Negative.  Negative for suicidal ideas.   ? ?Past Medical History:  ?Diagnosis Date  ? Chronic kidney disease   ? Depression   ? After husbands death, takes medication  ? Diabetes mellitus without complication ([place])   ? Hyperlipidemia   ? Hypertension   ? ? ?History reviewed. No pertinent surgical history. ? ?Family History  ?Problem Relation Age of Onset  ? Heart  attack Brother   ? Diabetes Neg Hx   ? Hypertension Neg Hx   ? ? ?Social History Reviewed with no changes to be made today.  ? ?Outpatient Medications Prior to Visit  ?Medication Sig Dispense Refill  ? azelastine (OPTIVAR) 0.05 % ophthalmic solution Place 1 drop into both eyes daily. 6 mL 12  ? glucose blood (TRUE METRIX BLOOD GLUCOSE TEST) test strip Use as instructed. Check blood glucose level by fingerstick once per day. 100 each 2  ? TRUEplus Lancets 28G MISC USE AS INSTRUCTED. 100 each 3  ? hydrochlorothiazide (HYDRODIURIL) 25 MG tablet Take 1 tablet by mouth once daily 90 tablet 0  ? losartan (COZAAR) 50 MG tablet Take 1 tablet by mouth once daily 90 tablet 0  ? metFORMIN (GLUCOPHAGE) 500 MG tablet TAKE 1 TABLET BY MOUTH TWICE DAILY WITH A MEAL 180 tablet 0  ? omeprazole (PRILOSEC) 20 MG capsule Take 1 capsule by mouth once daily 90 capsule 0  ? pravastatin (PRAVACHOL) 40 MG tablet Take 1 tablet by mouth once daily 90 tablet 2  ? sertraline (ZOLOFT) 50 MG tablet Take 1 tablet (50 mg total) by mouth daily. 30 tablet 2  ? triamcinolone (KENALOG) 0.1 % Apply 1 application topically 2 (two) times daily. Prn itching 45 g 1  ? ?No facility-administered medications prior to visit.  ? ? ?Allergies  ?Allergen Reactions  ? Chocolate   ?  Reports vertigo sx  ? Coffee Bean Extract [Coffea Arabica]   ? ? ?   ?Objective:  ?  ?BP 126/74 (BP Location: Right Arm, Patient Position: Sitting, Cuff Size: Large)   Pulse 62   Temp 98.3 ?F (36.8 ?C) (Oral)   Ht $R'5\' 3"'fQ$  (1.6 m)   Wt 225 lb 9.6 oz (102.3 kg)   SpO2 93%   BMI 39.96 kg/m?  ?Wt Readings from Last 3 Encounters:  ?09/13/21 225 lb 9.6 oz (102.3 kg)  ?07/23/21 228 lb (103.4 kg)  ?07/13/21 225 lb 1.6 oz (102.1 kg)  ? ? ?Physical Exam ?Vitals and nursing note reviewed.  ?Constitutional:   ?   Appearance: She is well-developed.  ?HENT:  ?   Head: Normocephalic and atraumatic.  ?Cardiovascular:  ?   Rate and Rhythm: Normal rate and regular rhythm.  ?   Heart sounds: Normal  heart sounds. No murmur heard. ?  No friction rub. No gallop.  ?Pulmonary:  ?   Effort: Pulmonary effort is normal. No tachypnea or respiratory distress.  ?   Breath sounds: Normal breath sounds. No decreased breath sounds, wheezing, rhonchi or rales.  ?Chest:  ?   Chest wall: No tenderness.  ?Abdominal:  ?   General: Bowel sounds are normal.  ?  Palpations: Abdomen is soft.  ?Musculoskeletal:     ?   General: Normal range of motion.  ?   Cervical back: Normal range of motion.  ?Skin: ?   General: Skin is warm and dry.  ?Neurological:  ?   Mental Status: She is alert and oriented to person, place, and time.  ?   Coordination: Coordination normal.  ?Psychiatric:     ?   Behavior: Behavior normal. Behavior is cooperative.     ?   Thought Content: Thought content normal.     ?   Judgment: Judgment normal.  ? ? ? ? ?   ?Patient has been counseled extensively about nutrition and exercise as well as the importance of adherence with medications and regular follow-up. The patient was given clear instructions to go to ER or return to medical center if symptoms don't improve, worsen or new problems develop. The patient verbalized understanding.  ? ?Follow-up: Return for Needs nurse visit for ear wax removal on next Friday .  ? ?Gildardo Pounds, FNP-BC ?[place] ?[place], [place] ?[phone]   ?09/14/2021, 8:10 PM ?

## 2021-09-13 NOTE — Progress Notes (Signed)
Would like to have 90 day supply of prescriptions ?

## 2021-09-14 ENCOUNTER — Other Ambulatory Visit: Payer: Self-pay

## 2021-09-14 ENCOUNTER — Other Ambulatory Visit: Payer: Self-pay | Admitting: Nurse Practitioner

## 2021-09-14 ENCOUNTER — Encounter: Payer: Self-pay | Admitting: Nurse Practitioner

## 2021-09-14 DIAGNOSIS — I10 Essential (primary) hypertension: Secondary | ICD-10-CM

## 2021-09-14 DIAGNOSIS — D649 Anemia, unspecified: Secondary | ICD-10-CM

## 2021-09-14 LAB — CBC WITH DIFFERENTIAL/PLATELET
Basophils Absolute: 0.1 10*3/uL (ref 0.0–0.2)
Basos: 1 %
EOS (ABSOLUTE): 0.3 10*3/uL (ref 0.0–0.4)
Eos: 3 %
Hematocrit: 31.1 % — ABNORMAL LOW (ref 34.0–46.6)
Hemoglobin: 10.7 g/dL — ABNORMAL LOW (ref 11.1–15.9)
Immature Grans (Abs): 0 10*3/uL (ref 0.0–0.1)
Immature Granulocytes: 0 %
Lymphocytes Absolute: 7.1 10*3/uL — ABNORMAL HIGH (ref 0.7–3.1)
Lymphs: 62 %
MCH: 33.9 pg — ABNORMAL HIGH (ref 26.6–33.0)
MCHC: 34.4 g/dL (ref 31.5–35.7)
MCV: 98 fL — ABNORMAL HIGH (ref 79–97)
Monocytes Absolute: 1 10*3/uL — ABNORMAL HIGH (ref 0.1–0.9)
Monocytes: 9 %
Neutrophils Absolute: 2.9 10*3/uL (ref 1.4–7.0)
Neutrophils: 25 %
Platelets: 302 10*3/uL (ref 150–450)
RBC: 3.16 x10E6/uL — ABNORMAL LOW (ref 3.77–5.28)
RDW: 14.4 % (ref 11.7–15.4)
WBC: 11.4 10*3/uL — ABNORMAL HIGH (ref 3.4–10.8)

## 2021-09-14 LAB — CMP14+EGFR
ALT: 22 IU/L (ref 0–32)
AST: 12 IU/L (ref 0–40)
Albumin/Globulin Ratio: 1.3 (ref 1.2–2.2)
Albumin: 4.1 g/dL (ref 3.8–4.8)
Alkaline Phosphatase: 49 IU/L (ref 44–121)
BUN/Creatinine Ratio: 24 (ref 12–28)
BUN: 30 mg/dL — ABNORMAL HIGH (ref 8–27)
Bilirubin Total: 0.3 mg/dL (ref 0.0–1.2)
CO2: 23 mmol/L (ref 20–29)
Calcium: 9.4 mg/dL (ref 8.7–10.3)
Chloride: 101 mmol/L (ref 96–106)
Creatinine, Ser: 1.24 mg/dL — ABNORMAL HIGH (ref 0.57–1.00)
Globulin, Total: 3.2 g/dL (ref 1.5–4.5)
Glucose: 108 mg/dL — ABNORMAL HIGH (ref 70–99)
Potassium: 4.5 mmol/L (ref 3.5–5.2)
Sodium: 137 mmol/L (ref 134–144)
Total Protein: 7.3 g/dL (ref 6.0–8.5)
eGFR: 47 mL/min/{1.73_m2} — ABNORMAL LOW (ref >59)

## 2021-09-14 LAB — LIPID PANEL
Chol/HDL Ratio: 5.6 ratio — ABNORMAL HIGH (ref 0.0–4.4)
Cholesterol, Total: 217 mg/dL — ABNORMAL HIGH (ref 100–199)
HDL: 39 mg/dL — ABNORMAL LOW (ref >39)
LDL Chol Calc (NIH): 140 mg/dL — ABNORMAL HIGH (ref 0–99)
Triglycerides: 213 mg/dL — ABNORMAL HIGH (ref 0–149)
VLDL Cholesterol Cal: 38 mg/dL (ref 5–40)

## 2021-09-14 LAB — HEMOGLOBIN A1C
Est. average glucose Bld gHb Est-mCnc: 151 mg/dL
Hgb A1c MFr Bld: 6.9 % — ABNORMAL HIGH (ref 4.8–5.6)

## 2021-09-14 MED ORDER — HYDROCHLOROTHIAZIDE 25 MG PO TABS: 12.5000 mg | ORAL_TABLET | Freq: Every day | ORAL | 0 refills | Status: DC

## 2021-09-15 ENCOUNTER — Other Ambulatory Visit: Payer: Self-pay

## 2021-09-15 MED ORDER — TRUE METRIX BLOOD GLUCOSE TEST VI STRP
ORAL_STRIP | 2 refills | Status: DC
  Filled 2021-09-15: qty 100, 100d supply, fill #0
  Filled 2021-12-08: qty 100, 100d supply, fill #1
  Filled 2022-01-05 – 2022-03-31 (×2): qty 100, 100d supply, fill #2

## 2021-09-15 NOTE — Telephone Encounter (Signed)
Requested Prescriptions  ?Pending Prescriptions Disp Refills  ?? glucose blood (TRUE METRIX BLOOD GLUCOSE TEST) test strip 100 each 2  ?  Sig: Use as instructed. Check blood glucose level by fingerstick once per day.  ?  ? Endocrinology: Diabetes - Testing Supplies Passed - 09/13/2021  5:13 PM  ?  ?  Passed - Valid encounter within last 12 months  ?  Recent Outpatient Visits   ?      ? 2 days ago Essential hypertension  ? [facility] [place], [place] W, NP  ? 3 months ago Uncontrolled type 2 diabetes mellitus with hyperglycemia (HCC)  ? [facility] [place], [place] W, NP  ? 9 months ago Uncontrolled type 2 diabetes mellitus with hyperglycemia (HCC)  ? [facility] [place], [place] W, NP  ? 1 year ago Uncontrolled type 2 diabetes mellitus with hyperglycemia (HCC)  ? [facility] [place], [place], [place]  ? 2 years ago Uncontrolled type 2 diabetes mellitus with hyperglycemia (HCC)  ? [facility] [place], [place] Stakes, NP  ?  ?  ? ?  ?  ?  ? ? ?

## 2021-09-17 ENCOUNTER — Ambulatory Visit: Payer: Self-pay | Attending: Nurse Practitioner

## 2021-09-17 ENCOUNTER — Other Ambulatory Visit: Payer: Self-pay

## 2021-09-17 DIAGNOSIS — H6122 Impacted cerumen, left ear: Secondary | ICD-10-CM

## 2021-09-17 NOTE — Progress Notes (Signed)
Pt arrived for ear wax removal 

## 2021-10-05 ENCOUNTER — Inpatient Hospital Stay: Payer: Self-pay | Attending: Oncology | Admitting: Oncology

## 2021-10-05 ENCOUNTER — Inpatient Hospital Stay: Payer: Self-pay

## 2021-10-05 ENCOUNTER — Encounter: Payer: Self-pay | Admitting: Oncology

## 2021-10-05 VITALS — BP 109/79 | HR 64 | Temp 96.8°F | Resp 18 | Wt 226.6 lb

## 2021-10-05 DIAGNOSIS — Z800 Family history of malignant neoplasm of digestive organs: Secondary | ICD-10-CM | POA: Insufficient documentation

## 2021-10-05 DIAGNOSIS — D649 Anemia, unspecified: Secondary | ICD-10-CM

## 2021-10-05 DIAGNOSIS — N1831 Chronic kidney disease, stage 3a: Secondary | ICD-10-CM

## 2021-10-05 DIAGNOSIS — I129 Hypertensive chronic kidney disease with stage 1 through stage 4 chronic kidney disease, or unspecified chronic kidney disease: Secondary | ICD-10-CM | POA: Insufficient documentation

## 2021-10-05 DIAGNOSIS — E1122 Type 2 diabetes mellitus with diabetic chronic kidney disease: Secondary | ICD-10-CM | POA: Insufficient documentation

## 2021-10-05 DIAGNOSIS — D539 Nutritional anemia, unspecified: Secondary | ICD-10-CM

## 2021-10-05 DIAGNOSIS — Z803 Family history of malignant neoplasm of breast: Secondary | ICD-10-CM | POA: Insufficient documentation

## 2021-10-05 DIAGNOSIS — D631 Anemia in chronic kidney disease: Secondary | ICD-10-CM

## 2021-10-05 LAB — CBC WITH DIFFERENTIAL/PLATELET
Abs Immature Granulocytes: 0.02 10*3/uL (ref 0.00–0.07)
Basophils Absolute: 0.1 10*3/uL (ref 0.0–0.1)
Basophils Relative: 1 %
Eosinophils Absolute: 0.2 10*3/uL (ref 0.0–0.5)
Eosinophils Relative: 2 %
HCT: 31.9 % — ABNORMAL LOW (ref 36.0–46.0)
Hemoglobin: 10.5 g/dL — ABNORMAL LOW (ref 12.0–15.0)
Immature Granulocytes: 0 %
Lymphocytes Relative: 56 %
Lymphs Abs: 5.3 10*3/uL — ABNORMAL HIGH (ref 0.7–4.0)
MCH: 34 pg (ref 26.0–34.0)
MCHC: 32.9 g/dL (ref 30.0–36.0)
MCV: 103.2 fL — ABNORMAL HIGH (ref 80.0–100.0)
Monocytes Absolute: 1 10*3/uL (ref 0.1–1.0)
Monocytes Relative: 10 %
Neutro Abs: 2.9 10*3/uL (ref 1.7–7.7)
Neutrophils Relative %: 31 %
Platelets: 290 10*3/uL (ref 150–400)
RBC: 3.09 MIL/uL — ABNORMAL LOW (ref 3.87–5.11)
RDW: 15.8 % — ABNORMAL HIGH (ref 11.5–15.5)
WBC: 9.4 10*3/uL (ref 4.0–10.5)
nRBC: 0 % (ref 0.0–0.2)

## 2021-10-05 LAB — COMPREHENSIVE METABOLIC PANEL
ALT: 23 U/L (ref 0–44)
AST: 17 U/L (ref 15–41)
Albumin: 3.7 g/dL (ref 3.5–5.0)
Alkaline Phosphatase: 42 U/L (ref 38–126)
Anion gap: 8 (ref 5–15)
BUN: 27 mg/dL — ABNORMAL HIGH (ref 8–23)
CO2: 25 mmol/L (ref 22–32)
Calcium: 8.5 mg/dL — ABNORMAL LOW (ref 8.9–10.3)
Chloride: 103 mmol/L (ref 98–111)
Creatinine, Ser: 1.05 mg/dL — ABNORMAL HIGH (ref 0.44–1.00)
GFR, Estimated: 58 mL/min — ABNORMAL LOW (ref >60)
Glucose, Bld: 98 mg/dL (ref 70–99)
Potassium: 3.9 mmol/L (ref 3.5–5.1)
Sodium: 136 mmol/L (ref 135–145)
Total Bilirubin: 0.2 mg/dL — ABNORMAL LOW (ref 0.3–1.2)
Total Protein: 7.6 g/dL (ref 6.5–8.1)

## 2021-10-05 LAB — VITAMIN B12: Vitamin B-12: 593 pg/mL (ref 180–914)

## 2021-10-05 LAB — LACTATE DEHYDROGENASE: LDH: 135 U/L (ref 98–192)

## 2021-10-05 LAB — FERRITIN: Ferritin: 85 ng/mL (ref 11–307)

## 2021-10-05 LAB — IRON AND TIBC
Iron: 90 ug/dL (ref 28–170)
Saturation Ratios: 28 % (ref 10.4–31.8)
TIBC: 326 ug/dL (ref 250–450)
UIBC: 236 ug/dL

## 2021-10-05 LAB — RETIC PANEL
Immature Retic Fract: 13.8 % (ref 2.3–15.9)
RBC.: 3.06 MIL/uL — ABNORMAL LOW (ref 3.87–5.11)
Retic Count, Absolute: 67.6 10*3/uL (ref 19.0–186.0)
Retic Ct Pct: 2.2 % (ref 0.4–3.1)
Reticulocyte Hemoglobin: 36.4 pg (ref >27.9)

## 2021-10-05 LAB — FOLATE: Folate: 7.7 ng/mL (ref >5.9)

## 2021-10-05 NOTE — Progress Notes (Signed)
Hematology/Oncology Consult note [phone] [phone]         Patient Care Team: Gildardo Pounds, NP as PCP - General (Nurse Practitioner)  REFERRING PROVIDER: Gildardo Pounds, NP  CHIEF COMPLAINTS/REASON FOR VISIT:  Evaluation of anemia  HISTORY OF PRESENTING ILLNESS:   Christina Clark is a  71 y.o.  female with PMH listed below was seen in consultation at the request of  Gildardo Pounds, NP  for evaluation of anemia  09/13/2021, patient CBC done which showed a hemoglobin 10.7.  Mildly elevated WBC 11.4 with predominantly increased lymphocytes and monocytes. Reviewed patient previous lab records. Her hemoglobin was decreased at 10.7 on 07/16/2021.  She had normal hemoglobin between 2021-2022.  11/14/2018, hemoglobin 10.6. Patient denies any unintentional weight loss, night sweats, fever.  She denies acid reflux although her daughter feels that patient may have some acid reflux symptoms.  Patient endorses mild epigastric pain intermittently.  Denies any blood in the stool or black stool.  She occasionally takes Tylenol or ibuprofen for headache.  Family history positive for mother with colon cancer, sister with breast cancer.  Patient is up-to-date for her mammogram.  Review of Systems  Constitutional:  Negative for appetite change, chills, fatigue and fever.  HENT:   Negative for hearing loss and voice change.   Eyes:  Negative for eye problems.  Respiratory:  Negative for chest tightness and cough.   Cardiovascular:  Negative for chest pain.  Gastrointestinal:  Negative for abdominal distention, abdominal pain and blood in stool.  Endocrine: Negative for hot flashes.  Genitourinary:  Negative for difficulty urinating and frequency.   Musculoskeletal:  Negative for arthralgias.  Skin:  Negative for itching and rash.  Neurological:  Negative for extremity weakness.  Hematological:  Negative for adenopathy.  Psychiatric/Behavioral:  Negative for  confusion.    MEDICAL HISTORY:  Past Medical History:  Diagnosis Date   Chronic kidney disease    Depression    After husbands death, takes medication   Diabetes mellitus without complication ([place])    Hyperlipidemia    Hypertension     SURGICAL HISTORY: Past Surgical History:  Procedure Laterality Date   cataract surgery      SOCIAL HISTORY: Social History   Socioeconomic History   Marital status: Widowed    Spouse name: Not on file   Number of children: 4   Years of education: Not on file   Highest education level: High school graduate  Occupational History   Not on file  Tobacco Use   Smoking status: Never   Smokeless tobacco: Never  Vaping Use   Vaping Use: Never used  Substance and Sexual Activity   Alcohol use: Not Currently   Drug use: Never   Sexual activity: Not Currently  Other Topics Concern   Not on file  Social History Narrative   Not on file   Social Determinants of Health   Financial Resource Strain: Not on file  Food Insecurity: No Food Insecurity   Worried About Running Out of Food in the Last Year: Never true   [place] in the Last Year: Never true  Transportation Needs: No Transportation Needs   Lack of Transportation (Medical): No   Lack of Transportation (Non-Medical): No  Physical Activity: Not on file  Stress: Not on file  Social Connections: Not on file  Intimate Partner Violence: Not on file    FAMILY HISTORY: Family History  Problem Relation Age of Onset   Colon  cancer Mother    Breast cancer Sister    Heart attack Brother    Diabetes Neg Hx    Hypertension Neg Hx     ALLERGIES:  is allergic to chocolate and coffee bean extract [coffea arabica].  MEDICATIONS:  Current Outpatient Medications  Medication Sig Dispense Refill   azelastine (OPTIVAR) 0.05 % ophthalmic solution Place 1 drop into both eyes daily. 6 mL 12   glucose blood (TRUE METRIX BLOOD GLUCOSE TEST) test strip Use as instructed. Check blood glucose  level by fingerstick once per day. 100 each 2   hydrochlorothiazide (HYDRODIURIL) 25 MG tablet Take 0.5 tablets (12.5 mg total) by mouth daily. 45 tablet 0   losartan (COZAAR) 50 MG tablet Take 1 tablet (50 mg total) by mouth daily. 90 tablet 1   metFORMIN (GLUCOPHAGE) 500 MG tablet Take 1 tablet (500 mg total) by mouth 2 (two) times daily with a meal. Please fill as a 90 day supply 180 tablet 1   omeprazole (PRILOSEC) 20 MG capsule Take 1 capsule (20 mg total) by mouth daily. Please fill as a 90 day supply 90 capsule 1   pravastatin (PRAVACHOL) 40 MG tablet Take 1 tablet (40 mg total) by mouth daily. Please fill as a 90 day supply 90 tablet 2   sertraline (ZOLOFT) 50 MG tablet Take 1 tablet (50 mg total) by mouth daily. Please fill as a 90 day supply 90 tablet 1   TRUEplus Lancets 28G MISC USE AS INSTRUCTED. 100 each 3   No current facility-administered medications for this visit.     PHYSICAL EXAMINATION: ECOG PERFORMANCE STATUS: 0 - Asymptomatic Vitals:   10/05/21 1014  BP: 109/79  Pulse: 64  Resp: 18  Temp: (!) 96.8 F (36 C)   Filed Weights   10/05/21 1014  Weight: 226 lb 9.6 oz (102.8 kg)    Physical Exam Constitutional:      General: She is not in acute distress.    Appearance: She is obese.  HENT:     Head: Normocephalic and atraumatic.  Eyes:     General: No scleral icterus. Cardiovascular:     Rate and Rhythm: Normal rate and regular rhythm.     Heart sounds: Murmur heard.  Pulmonary:     Effort: Pulmonary effort is normal. No respiratory distress.     Breath sounds: No wheezing.  Abdominal:     General: Bowel sounds are normal. There is no distension.     Palpations: Abdomen is soft.  Musculoskeletal:        General: No deformity. Normal range of motion.     Cervical back: Normal range of motion and neck supple.  Skin:    General: Skin is warm and dry.     Findings: No erythema or rash.  Neurological:     Mental Status: She is alert and oriented to  person, place, and time. Mental status is at baseline.     Cranial Nerves: No cranial nerve deficit.     Coordination: Coordination normal.  Psychiatric:        Mood and Affect: Mood normal.    LABORATORY DATA:  I have reviewed the data as listed Lab Results  Component Value Date   WBC 9.4 10/05/2021   HGB 10.5 (L) 10/05/2021   HCT 31.9 (L) 10/05/2021   MCV 103.2 (H) 10/05/2021   PLT 290 10/05/2021   Recent Labs    06/14/21 1450 09/13/21 1646 10/05/21 1100  NA 142 137 136  K 4.6 4.5  3.9  CL 103 101 103  CO2 _0 GLUCOSE 76 108* 98  BUN 29* 30* 27*  CREATININE 1.10* 1.24* 1.05*  CALCIUM 9.4 9.4 8.5*  GFRNONAA  --   --  58*  PROT 7.2 7.3 7.6  ALBUMIN 4.2 4.1 3.7  AST _1 ALT _2 ALKPHOS 52 49 42  BILITOT 0.2 0.3 0.2*   Iron/TIBC/Ferritin/ %Sat    Component Value Date/Time   IRON 90 10/05/2021 1100   TIBC 326 10/05/2021 1100   FERRITIN 85 10/05/2021 1100   IRONPCTSAT 28 10/05/2021 1100      RADIOGRAPHIC STUDIES: I have personally reviewed the radiological images as listed and agreed with the findings in the report. No results found.    ASSESSMENT & PLAN:  1. Macrocytic anemia   2. Anemia in stage 3a chronic kidney disease ([place])    #Normocytic anemia, in context of chronic kidney insufficiency.  Probably anemia due to chronic kidney disease.  Rule out other reasons. Recommend anemia work-up, check CBC, CMP, reticulocyte panel, vitamin B12, folate, iron, TIBC, ferritin, multiple myeloma panel, light chain ratio, LDH.  Orders Placed This Encounter  Procedures   Multiple Myeloma Panel (SPEP&IFE w/QIG)    Standing Status:   Future    Number of Occurrences:   1    Standing Expiration Date:   10/06/2022   Kappa/lambda light chains    Standing Status:   Future    Number of Occurrences:   1    Standing Expiration Date:   10/06/2022   Comprehensive metabolic panel    Standing Status:   Future    Number of Occurrences:   1    Standing  Expiration Date:   10/06/2022   CBC with Differential/Platelet    Standing Status:   Future    Number of Occurrences:   1    Standing Expiration Date:   10/06/2022   Retic Panel    Standing Status:   Future    Number of Occurrences:   1    Standing Expiration Date:   10/06/2022   Vitamin B12    Standing Status:   Future    Number of Occurrences:   1    Standing Expiration Date:   10/06/2022   Folate    Standing Status:   Future    Number of Occurrences:   1    Standing Expiration Date:   10/06/2022   Ferritin    Standing Status:   Future    Number of Occurrences:   1    Standing Expiration Date:   04/06/2022   Iron and TIBC    Standing Status:   Future    Number of Occurrences:   1    Standing Expiration Date:   10/06/2022   Lactate dehydrogenase    Standing Status:   Future    Number of Occurrences:   1    Standing Expiration Date:   10/06/2022    All questions were answered. The patient knows to call the clinic with any problems questions or concerns.   Gildardo Pounds, NP    Return of visit: 2 to 3 weeks to go over results. Thank you for this kind referral and the opportunity to participate in the care of this patient. A copy of today's note is routed to referring provider   Earlie Server, MD, PhD [facility] Hematology Oncology 10/05/2021

## 2021-10-06 LAB — KAPPA/LAMBDA LIGHT CHAINS
Kappa free light chain: 53.2 mg/L — ABNORMAL HIGH (ref 3.3–19.4)
Kappa, lambda light chain ratio: 1.5 (ref 0.26–1.65)
Lambda free light chains: 35.5 mg/L — ABNORMAL HIGH (ref 5.7–26.3)

## 2021-10-08 ENCOUNTER — Ambulatory Visit: Payer: Self-pay | Attending: Nurse Practitioner

## 2021-10-08 DIAGNOSIS — I10 Essential (primary) hypertension: Secondary | ICD-10-CM

## 2021-10-09 LAB — CMP14+EGFR
ALT: 20 IU/L (ref 0–32)
AST: 10 IU/L (ref 0–40)
Albumin/Globulin Ratio: 1.3 (ref 1.2–2.2)
Albumin: 4.1 g/dL (ref 3.8–4.8)
Alkaline Phosphatase: 52 IU/L (ref 44–121)
BUN/Creatinine Ratio: 26 (ref 12–28)
BUN: 26 mg/dL (ref 8–27)
Bilirubin Total: 0.3 mg/dL (ref 0.0–1.2)
CO2: 25 mmol/L (ref 20–29)
Calcium: 9.6 mg/dL (ref 8.7–10.3)
Chloride: 104 mmol/L (ref 96–106)
Creatinine, Ser: 0.99 mg/dL (ref 0.57–1.00)
Globulin, Total: 3.2 g/dL (ref 1.5–4.5)
Glucose: 105 mg/dL — ABNORMAL HIGH (ref 70–99)
Potassium: 5.2 mmol/L (ref 3.5–5.2)
Sodium: 140 mmol/L (ref 134–144)
Total Protein: 7.3 g/dL (ref 6.0–8.5)
eGFR: 62 mL/min/{1.73_m2} (ref >59)

## 2021-10-11 LAB — MULTIPLE MYELOMA PANEL, SERUM
Albumin SerPl Elph-Mcnc: 3.6 g/dL (ref 2.9–4.4)
Albumin/Glob SerPl: 1.1 (ref 0.7–1.7)
Alpha 1: 0.2 g/dL (ref 0.0–0.4)
Alpha2 Glob SerPl Elph-Mcnc: 0.7 g/dL (ref 0.4–1.0)
B-Globulin SerPl Elph-Mcnc: 0.9 g/dL (ref 0.7–1.3)
Gamma Glob SerPl Elph-Mcnc: 1.7 g/dL (ref 0.4–1.8)
Globulin, Total: 3.5 g/dL (ref 2.2–3.9)
IgA: 293 mg/dL (ref 87–352)
IgG (Immunoglobin G), Serum: 1621 mg/dL — ABNORMAL HIGH (ref 586–1602)
IgM (Immunoglobulin M), Srm: 230 mg/dL — ABNORMAL HIGH (ref 26–217)
Total Protein ELP: 7.1 g/dL (ref 6.0–8.5)

## 2021-10-18 ENCOUNTER — Other Ambulatory Visit: Payer: Self-pay | Admitting: Nurse Practitioner

## 2021-10-18 DIAGNOSIS — I10 Essential (primary) hypertension: Secondary | ICD-10-CM

## 2021-10-18 MED ORDER — HYDROCHLOROTHIAZIDE 25 MG PO TABS
12.5000 mg | ORAL_TABLET | Freq: Every day | ORAL | 0 refills | Status: DC
  Filled 2021-10-18: qty 45, 90d supply, fill #0

## 2021-10-19 ENCOUNTER — Other Ambulatory Visit: Payer: Self-pay

## 2021-10-20 ENCOUNTER — Other Ambulatory Visit: Payer: Self-pay

## 2021-10-26 ENCOUNTER — Encounter: Payer: Self-pay | Admitting: Oncology

## 2021-10-26 ENCOUNTER — Encounter: Payer: Self-pay | Admitting: Nurse Practitioner

## 2021-10-26 ENCOUNTER — Inpatient Hospital Stay (HOSPITAL_BASED_OUTPATIENT_CLINIC_OR_DEPARTMENT_OTHER): Payer: No Typology Code available for payment source | Admitting: Oncology

## 2021-10-26 VITALS — BP 139/60 | HR 75 | Temp 97.5°F | Wt 225.0 lb

## 2021-10-26 DIAGNOSIS — D539 Nutritional anemia, unspecified: Secondary | ICD-10-CM

## 2021-10-26 DIAGNOSIS — N1831 Chronic kidney disease, stage 3a: Secondary | ICD-10-CM

## 2021-10-26 DIAGNOSIS — D631 Anemia in chronic kidney disease: Secondary | ICD-10-CM | POA: Insufficient documentation

## 2021-10-26 MED ORDER — FERROUS SULFATE 325 (65 FE) MG PO TBEC
325.0000 mg | DELAYED_RELEASE_TABLET | Freq: Two times a day (BID) | ORAL | 2 refills | Status: DC
  Filled 2022-07-18: qty 60, 30d supply, fill #0

## 2021-10-26 MED ORDER — FOLIC ACID 400 MCG PO TABS
400.0000 ug | ORAL_TABLET | Freq: Every day | ORAL | 2 refills | Status: AC
  Filled 2022-07-18: qty 100, 100d supply, fill #0
  Filled 2022-10-12: qty 100, 100d supply, fill #1

## 2021-12-08 ENCOUNTER — Other Ambulatory Visit: Payer: Self-pay | Admitting: Nurse Practitioner

## 2021-12-08 DIAGNOSIS — E1165 Type 2 diabetes mellitus with hyperglycemia: Secondary | ICD-10-CM

## 2021-12-08 DIAGNOSIS — I10 Essential (primary) hypertension: Secondary | ICD-10-CM

## 2021-12-09 ENCOUNTER — Other Ambulatory Visit: Payer: Self-pay

## 2021-12-09 ENCOUNTER — Encounter: Payer: Self-pay | Admitting: Oncology

## 2021-12-09 MED ORDER — HYDROCHLOROTHIAZIDE 25 MG PO TABS
12.5000 mg | ORAL_TABLET | Freq: Every day | ORAL | 0 refills | Status: DC
  Filled 2021-12-09 – 2022-01-17 (×4): qty 45, 90d supply, fill #0

## 2021-12-09 MED ORDER — TRUEPLUS LANCETS 28G MISC
3 refills | Status: DC
  Filled 2021-12-09: qty 100, 25d supply, fill #0
  Filled 2022-01-05: qty 100, 90d supply, fill #1
  Filled 2022-01-13: qty 100, 25d supply, fill #1
  Filled 2022-02-06 – 2022-03-10 (×2): qty 100, 25d supply, fill #2
  Filled 2022-04-04: qty 100, 25d supply, fill #3

## 2021-12-09 NOTE — Telephone Encounter (Signed)
Requested Prescriptions  Pending Prescriptions Disp Refills  . TRUEplus Lancets 28G MISC 100 each 3    Sig: USE AS INSTRUCTED.     Endocrinology: Diabetes - Testing Supplies Passed - 12/08/2021  6:49 PM      Passed - Valid encounter within last 12 months    Recent Outpatient Visits          2 months ago Essential hypertension   [place] [facility] [place], [place] W, NP   5 months ago Uncontrolled type 2 diabetes mellitus with hyperglycemia [facility])   [place] [facility] [place], [place] W, NP   11 months ago Uncontrolled type 2 diabetes mellitus with hyperglycemia [facility])   [place] [facility] [place], [place] W, NP   1 year ago Uncontrolled type 2 diabetes mellitus with hyperglycemia [facility])   [facility] [place], [place], [place]   2 years ago Uncontrolled type 2 diabetes mellitus with hyperglycemia [facility])   [place] [facility] [place], [place] W, NP             . hydrochlorothiazide (HYDRODIURIL) 25 MG tablet 45 tablet 0    Sig: Take 0.5 tablets (12.5 mg total) by mouth daily.     Cardiovascular: Diuretics - Thiazide Passed - 12/08/2021  6:49 PM      Passed - Cr in normal range and within 180 days    Creatinine, Ser  Date Value Ref Range Status  10/08/2021 0.99 0.57 - 1.00 mg/dL Final         Passed - K in normal range and within 180 days    Potassium  Date Value Ref Range Status  10/08/2021 5.2 3.5 - 5.2 mmol/L Final         Passed - Na in normal range and within 180 days    Sodium  Date Value Ref Range Status  10/08/2021 140 134 - 144 mmol/L Final         Passed - Last BP in normal range    BP Readings from Last 1 Encounters:  10/26/21 139/60         Passed - Valid encounter within last 6 months    Recent Outpatient Visits          2 months ago Essential hypertension   [place] [facility] [place], [place] W, NP   5 months ago Uncontrolled  type 2 diabetes mellitus with hyperglycemia [facility])   [facility] [place], [place] W, NP   11 months ago Uncontrolled type 2 diabetes mellitus with hyperglycemia [facility])   [place] [facility] [place], [place] W, NP   1 year ago Uncontrolled type 2 diabetes mellitus with hyperglycemia [facility])   [facility] And Wellness [place], [place], [place]   2 years ago Uncontrolled type 2 diabetes mellitus with hyperglycemia [facility])   [facility] [place], Shea Stakes, NP

## 2021-12-14 ENCOUNTER — Other Ambulatory Visit: Payer: Self-pay

## 2022-01-05 ENCOUNTER — Encounter: Payer: Self-pay | Admitting: Oncology

## 2022-01-05 ENCOUNTER — Other Ambulatory Visit: Payer: Self-pay | Admitting: Nurse Practitioner

## 2022-01-05 ENCOUNTER — Other Ambulatory Visit: Payer: Self-pay

## 2022-01-05 DIAGNOSIS — F3341 Major depressive disorder, recurrent, in partial remission: Secondary | ICD-10-CM

## 2022-01-05 MED ORDER — SERTRALINE HCL 50 MG PO TABS
50.0000 mg | ORAL_TABLET | Freq: Every day | ORAL | 0 refills | Status: DC
  Filled 2022-01-05 – 2022-03-10 (×2): qty 90, 90d supply, fill #0

## 2022-01-13 ENCOUNTER — Encounter: Payer: Self-pay | Admitting: Oncology

## 2022-01-13 ENCOUNTER — Other Ambulatory Visit: Payer: Self-pay

## 2022-01-17 ENCOUNTER — Encounter: Payer: Self-pay | Admitting: Oncology

## 2022-01-17 ENCOUNTER — Other Ambulatory Visit: Payer: Self-pay

## 2022-01-24 ENCOUNTER — Inpatient Hospital Stay: Payer: No Typology Code available for payment source

## 2022-01-25 ENCOUNTER — Ambulatory Visit: Payer: No Typology Code available for payment source

## 2022-01-25 ENCOUNTER — Ambulatory Visit: Payer: No Typology Code available for payment source | Admitting: Oncology

## 2022-02-07 ENCOUNTER — Other Ambulatory Visit: Payer: Self-pay

## 2022-02-07 ENCOUNTER — Encounter: Payer: Self-pay | Admitting: Oncology

## 2022-02-21 ENCOUNTER — Other Ambulatory Visit: Payer: Self-pay

## 2022-03-10 ENCOUNTER — Encounter: Payer: Self-pay | Admitting: Oncology

## 2022-03-10 ENCOUNTER — Other Ambulatory Visit: Payer: Self-pay

## 2022-03-14 ENCOUNTER — Other Ambulatory Visit: Payer: Self-pay

## 2022-03-21 ENCOUNTER — Other Ambulatory Visit: Payer: Self-pay

## 2022-03-31 ENCOUNTER — Other Ambulatory Visit: Payer: Self-pay

## 2022-03-31 ENCOUNTER — Encounter: Payer: Self-pay | Admitting: Oncology

## 2022-04-04 ENCOUNTER — Other Ambulatory Visit: Payer: Self-pay

## 2022-04-04 ENCOUNTER — Other Ambulatory Visit: Payer: Self-pay | Admitting: Nurse Practitioner

## 2022-04-04 ENCOUNTER — Encounter: Payer: Self-pay | Admitting: Oncology

## 2022-04-04 DIAGNOSIS — I10 Essential (primary) hypertension: Secondary | ICD-10-CM

## 2022-04-04 MED ORDER — HYDROCHLOROTHIAZIDE 25 MG PO TABS
12.5000 mg | ORAL_TABLET | Freq: Every day | ORAL | 0 refills | Status: DC
  Filled 2022-04-04: qty 15, 30d supply, fill #0

## 2022-04-11 ENCOUNTER — Other Ambulatory Visit: Payer: Self-pay | Admitting: Family Medicine

## 2022-04-11 ENCOUNTER — Other Ambulatory Visit: Payer: Self-pay

## 2022-04-11 ENCOUNTER — Other Ambulatory Visit: Payer: Self-pay | Admitting: Nurse Practitioner

## 2022-04-11 ENCOUNTER — Encounter: Payer: Self-pay | Admitting: Oncology

## 2022-04-11 DIAGNOSIS — I10 Essential (primary) hypertension: Secondary | ICD-10-CM

## 2022-04-11 DIAGNOSIS — R1013 Epigastric pain: Secondary | ICD-10-CM

## 2022-04-11 DIAGNOSIS — F3341 Major depressive disorder, recurrent, in partial remission: Secondary | ICD-10-CM

## 2022-04-11 DIAGNOSIS — E1165 Type 2 diabetes mellitus with hyperglycemia: Secondary | ICD-10-CM

## 2022-04-12 ENCOUNTER — Other Ambulatory Visit: Payer: Self-pay

## 2022-04-12 ENCOUNTER — Encounter: Payer: Self-pay | Admitting: Oncology

## 2022-04-12 MED ORDER — OMEPRAZOLE 20 MG PO CPDR
20.0000 mg | DELAYED_RELEASE_CAPSULE | Freq: Every day | ORAL | 0 refills | Status: DC
  Filled 2022-04-12: qty 30, 30d supply, fill #0

## 2022-04-12 MED ORDER — LOSARTAN POTASSIUM 50 MG PO TABS
50.0000 mg | ORAL_TABLET | Freq: Every day | ORAL | 0 refills | Status: DC
  Filled 2022-04-12: qty 30, 30d supply, fill #0

## 2022-04-12 MED ORDER — METFORMIN HCL 500 MG PO TABS
500.0000 mg | ORAL_TABLET | Freq: Two times a day (BID) | ORAL | 0 refills | Status: DC
  Filled 2022-04-12: qty 60, 30d supply, fill #0

## 2022-04-12 MED ORDER — SERTRALINE HCL 50 MG PO TABS
50.0000 mg | ORAL_TABLET | Freq: Every day | ORAL | 0 refills | Status: DC
  Filled 2022-04-12 – 2022-06-08 (×3): qty 30, 30d supply, fill #0

## 2022-04-14 ENCOUNTER — Other Ambulatory Visit: Payer: Self-pay

## 2022-05-05 ENCOUNTER — Other Ambulatory Visit: Payer: Self-pay

## 2022-05-11 ENCOUNTER — Other Ambulatory Visit: Payer: Self-pay | Admitting: Family Medicine

## 2022-05-11 DIAGNOSIS — E1165 Type 2 diabetes mellitus with hyperglycemia: Secondary | ICD-10-CM

## 2022-05-11 DIAGNOSIS — R1013 Epigastric pain: Secondary | ICD-10-CM

## 2022-05-11 DIAGNOSIS — I10 Essential (primary) hypertension: Secondary | ICD-10-CM

## 2022-05-12 ENCOUNTER — Other Ambulatory Visit: Payer: Self-pay | Admitting: Family Medicine

## 2022-05-12 DIAGNOSIS — R1013 Epigastric pain: Secondary | ICD-10-CM

## 2022-05-12 DIAGNOSIS — I10 Essential (primary) hypertension: Secondary | ICD-10-CM

## 2022-05-12 DIAGNOSIS — E1165 Type 2 diabetes mellitus with hyperglycemia: Secondary | ICD-10-CM

## 2022-05-12 NOTE — Telephone Encounter (Signed)
Requested Prescriptions  Pending Prescriptions Disp Refills   hydrochlorothiazide (HYDRODIURIL) 25 MG tablet 15 tablet 0    Sig: Take 0.5 tablets (12.5 mg total) by mouth daily.     Cardiovascular: Diuretics - Thiazide Failed - 05/12/2022  9:45 AM      Failed - Cr in normal range and within 180 days    Creatinine, Ser  Date Value Ref Range Status  10/08/2021 0.99 0.57 - 1.00 mg/dL Final         Failed - K in normal range and within 180 days    Potassium  Date Value Ref Range Status  10/08/2021 5.2 3.5 - 5.2 mmol/L Final         Failed - Na in normal range and within 180 days    Sodium  Date Value Ref Range Status  10/08/2021 140 134 - 144 mmol/L Final         Failed - Valid encounter within last 6 months    Recent Outpatient Visits           8 months ago Essential hypertension   [place], [place] W, NP   11 months ago Uncontrolled type 2 diabetes mellitus with hyperglycemia [facility])   [place], [place] W, NP   1 year ago Uncontrolled type 2 diabetes mellitus with hyperglycemia [facility])   [place] [facility] [place], [place] W, NP   1 year ago Uncontrolled type 2 diabetes mellitus with hyperglycemia [facility])   [place], [place], [place]   2 years ago Uncontrolled type 2 diabetes mellitus with hyperglycemia [facility])   [place], [place] W, NP              Passed - Last BP in normal range    BP Readings from Last 1 Encounters:  10/26/21 139/60          losartan (COZAAR) 50 MG tablet 30 tablet 0    Sig: Take 1 tablet (50 mg total) by mouth daily. Please schedule PCP visit for more refills.     Cardiovascular:  Angiotensin Receptor Blockers Failed - 05/12/2022  9:45 AM      Failed - Cr in normal range and within 180 days    Creatinine, Ser  Date Value Ref Range Status  10/08/2021 0.99 0.57 - 1.00 mg/dL  Final         Failed - K in normal range and within 180 days    Potassium  Date Value Ref Range Status  10/08/2021 5.2 3.5 - 5.2 mmol/L Final         Failed - Valid encounter within last 6 months    Recent Outpatient Visits           8 months ago Essential hypertension   [place], Vernia Buff, NP   11 months ago Uncontrolled type 2 diabetes mellitus with hyperglycemia [facility])   [place], [place] W, NP   1 year ago Uncontrolled type 2 diabetes mellitus with hyperglycemia [facility])   [place], [place] W, NP   1 year ago Uncontrolled type 2 diabetes mellitus with hyperglycemia [place])   [place] [employer], [place], [place]   2 years ago Uncontrolled type 2 diabetes mellitus with hyperglycemia [facility])   [place],  Vernia Buff, NP              Passed - Patient is not pregnant      Passed - Last BP in normal range    BP Readings from Last 1 Encounters:  10/26/21 139/60          metFORMIN (GLUCOPHAGE) 500 MG tablet 60 tablet 0    Sig: Take 1 tablet (500 mg total) by mouth 2 (two) times daily with a meal. Please schedule PCP visit for more refills.     Endocrinology:  Diabetes - Biguanides Failed - 05/12/2022  9:45 AM      Failed - HBA1C is between 0 and 7.9 and within 180 days    HbA1c, POC (controlled diabetic range)  Date Value Ref Range Status  07/16/2020 6.4 0.0 - 7.0 % Final   Hgb A1c MFr Bld  Date Value Ref Range Status  09/13/2021 6.9 (H) 4.8 - 5.6 % Final    Comment:             Prediabetes: 5.7 - 6.4          Diabetes: >6.4          Glycemic control for adults with diabetes: <7.0          Failed - Valid encounter within last 6 months    Recent Outpatient Visits           8 months ago Essential hypertension   [place], Vernia Buff, NP   11 months ago  Uncontrolled type 2 diabetes mellitus with hyperglycemia [facility])   [place], Vernia Buff, NP   1 year ago Uncontrolled type 2 diabetes mellitus with hyperglycemia ([place])   [place], Vernia Buff, NP   1 year ago Uncontrolled type 2 diabetes mellitus with hyperglycemia [facility])   [place], [place], [place]   2 years ago Uncontrolled type 2 diabetes mellitus with hyperglycemia [facility])   [place], [place] W, NP              Passed - Cr in normal range and within 360 days    Creatinine, Ser  Date Value Ref Range Status  10/08/2021 0.99 0.57 - 1.00 mg/dL Final         Passed - eGFR in normal range and within 360 days    GFR calc Af Amer  Date Value Ref Range Status  07/15/2019 57 (L) >59 mL/min/1.73 Final   GFR, Estimated  Date Value Ref Range Status  10/05/2021 58 (L) >60 mL/min Final    Comment:    (NOTE) Calculated using the CKD-EPI Creatinine Equation (2021)    eGFR  Date Value Ref Range Status  10/08/2021 62 >59 mL/min/1.73 Final         Passed - B12 Level in normal range and within 720 days    Vitamin B-12  Date Value Ref Range Status  10/05/2021 593 180 - 914 pg/mL Final    Comment:    (NOTE) This assay is not validated for testing neonatal or myeloproliferative syndrome specimens for Vitamin B12 levels. Performed at [facility], [place] [address], [place], [place] [address]          Passed - CBC within normal limits and completed in the last 12 months    WBC  Date Value Ref Range Status  10/05/2021 9.4  4.0 - 10.5 K/uL Final   RBC  Date Value Ref Range Status  10/05/2021 3.09 (L) 3.87 - 5.11 MIL/uL Final   RBC.  Date Value Ref Range Status  10/05/2021 3.06 (L) 3.87 - 5.11 MIL/uL Final   Hemoglobin  Date Value Ref Range Status  10/05/2021 10.5 (L) 12.0 - 15.0 g/dL Final  37/16/9678 93.8 (L) 11.1 - 15.9  g/dL Final   HCT  Date Value Ref Range Status  10/05/2021 31.9 (L) 36.0 - 46.0 % Final   Hematocrit  Date Value Ref Range Status  09/13/2021 31.1 (L) 34.0 - 46.6 % Final   MCHC  Date Value Ref Range Status  10/05/2021 32.9 30.0 - 36.0 g/dL Final   [facility]  Date Value Ref Range Status  10/05/2021 34.0 26.0 - 34.0 pg Final   MCV  Date Value Ref Range Status  10/05/2021 103.2 (H) 80.0 - 100.0 fL Final  09/13/2021 98 (H) 79 - 97 fL Final   No results found for: "PLTCOUNTKUC", "LABPLAT", "POCPLA" RDW  Date Value Ref Range Status  10/05/2021 15.8 (H) 11.5 - 15.5 % Final  09/13/2021 14.4 11.7 - 15.4 % Final          omeprazole (PRILOSEC) 20 MG capsule 30 capsule 0    Sig: Take 1 capsule (20 mg total) by mouth daily. Please schedule PCP visit for more refills.     Gastroenterology: Proton Pump Inhibitors Passed - 05/12/2022  9:45 AM      Passed - Valid encounter within last 12 months    Recent Outpatient Visits           8 months ago Essential hypertension   [facility] [place], [place] W, NP   11 months ago Uncontrolled type 2 diabetes mellitus with hyperglycemia [facility])   [place] [facility] [place], [place] W, NP   1 year ago Uncontrolled type 2 diabetes mellitus with hyperglycemia [facility])   [place] [facility] [place], She[place] Stakes, NP   1 year ago Uncontrolled type 2 diabetes mellitus with hyperglycemia [facility])   [facility] [place], [place], [place]   2 years ago Uncontrolled type 2 diabetes mellitus with hyperglycemia [facility])   [facility] [place], Shea Stakes, NP

## 2022-05-12 NOTE — Telephone Encounter (Signed)
Requested medication (s) are due for refill today: routing for review  Requested medication (s) are on the active medication list: yes  Last refill:  04/04/22  Future visit scheduled: no  Notes to clinic:  Unable to refill per protocol, courtesy refill already given, routing for provider approval.      Requested Prescriptions  Pending Prescriptions Disp Refills   hydrochlorothiazide (HYDRODIURIL) 25 MG tablet 15 tablet 0    Sig: Take 0.5 tablets (12.5 mg total) by mouth daily.     Cardiovascular: Diuretics - Thiazide Failed - 05/11/2022  7:03 PM      Failed - Cr in normal range and within 180 days    Creatinine, Ser  Date Value Ref Range Status  10/08/2021 0.99 0.57 - 1.00 mg/dL Final         Failed - K in normal range and within 180 days    Potassium  Date Value Ref Range Status  10/08/2021 5.2 3.5 - 5.2 mmol/L Final         Failed - Na in normal range and within 180 days    Sodium  Date Value Ref Range Status  10/08/2021 140 134 - 144 mmol/L Final         Failed - Valid encounter within last 6 months    Recent Outpatient Visits           8 months ago Essential hypertension   [place], [place] W, NP   11 months ago Uncontrolled type 2 diabetes mellitus with hyperglycemia [facility])   [place], [place] W, NP   1 year ago Uncontrolled type 2 diabetes mellitus with hyperglycemia [facility])   [place], [place] W, NP   1 year ago Uncontrolled type 2 diabetes mellitus with hyperglycemia [facility])   [place], [place], [place]   2 years ago Uncontrolled type 2 diabetes mellitus with hyperglycemia [facility])   [place], [place] W, NP              Passed - Last BP in normal range    BP Readings from Last 1 Encounters:  10/26/21 139/60          losartan (COZAAR) 50 MG tablet 30 tablet 0     Sig: Take 1 tablet (50 mg total) by mouth daily. Please schedule PCP visit for more refills.     Cardiovascular:  Angiotensin Receptor Blockers Failed - 05/11/2022  7:03 PM      Failed - Cr in normal range and within 180 days    Creatinine, Ser  Date Value Ref Range Status  10/08/2021 0.99 0.57 - 1.00 mg/dL Final         Failed - K in normal range and within 180 days    Potassium  Date Value Ref Range Status  10/08/2021 5.2 3.5 - 5.2 mmol/L Final         Failed - Valid encounter within last 6 months    Recent Outpatient Visits           8 months ago Essential hypertension   [place], Vernia Buff, NP   11 months ago Uncontrolled type 2 diabetes mellitus with hyperglycemia [facility])   [place], [place] W, NP   1 year ago Uncontrolled type 2 diabetes mellitus with hyperglycemia ([place])   [place], [place] W, NP   1 year ago Uncontrolled type 2 diabetes mellitus with hyperglycemia [facility])   [place], [place], [place]   2 years ago Uncontrolled type 2 diabetes mellitus with hyperglycemia [facility])   [place], Vernia Buff, [place]              Passed - Patient is not pregnant      Passed - Last BP in normal range    BP Readings from Last 1 Encounters:  10/26/21 139/60          metFORMIN (GLUCOPHAGE) 500 MG tablet 60 tablet 0    Sig: Take 1 tablet (500 mg total) by mouth 2 (two) times daily with a meal. Please schedule PCP visit for more refills.     Endocrinology:  Diabetes - Biguanides Failed - 05/11/2022  7:03 PM      Failed - HBA1C is between 0 and 7.9 and within 180 days    HbA1c, POC (controlled diabetic range)  Date Value Ref Range Status  07/16/2020 6.4 0.0 - 7.0 % Final   Hgb A1c MFr Bld  Date Value Ref Range Status  09/13/2021 6.9 (H) 4.8 - 5.6 % Final    Comment:              Prediabetes: 5.7 - 6.4          Diabetes: >6.4          Glycemic control for adults with diabetes: <7.0          Failed - Valid encounter within last 6 months    Recent Outpatient Visits           8 months ago Essential hypertension   [place], Vernia Buff, NP   11 months ago Uncontrolled type 2 diabetes mellitus with hyperglycemia [facility])   [place], Vernia Buff, NP   1 year ago Uncontrolled type 2 diabetes mellitus with hyperglycemia ([place])   [place], Vernia Buff, NP   1 year ago Uncontrolled type 2 diabetes mellitus with hyperglycemia [facility])   [place], [place], [place]   2 years ago Uncontrolled type 2 diabetes mellitus with hyperglycemia [facility])   [place], [place] W, NP              Passed - Cr in normal range and within 360 days    Creatinine, Ser  Date Value Ref Range Status  10/08/2021 0.99 0.57 - 1.00 mg/dL Final         Passed - eGFR in normal range and within 360 days    GFR calc Af Amer  Date Value Ref Range Status  07/15/2019 57 (L) >59 mL/min/1.73 Final   GFR, Estimated  Date Value Ref Range Status  10/05/2021 58 (L) >60 mL/min Final    Comment:    (NOTE) Calculated using the CKD-EPI Creatinine Equation (2021)    eGFR  Date Value Ref Range Status  10/08/2021 62 >59 mL/min/1.73 Final         Passed - B12 Level in normal range and within 720 days    Vitamin B-12  Date Value Ref Range Status  10/05/2021 593 180 - 914 pg/mL Final    Comment:    (NOTE) This assay is not validated for testing neonatal or  myeloproliferative syndrome specimens for Vitamin B12 levels. Performed at [facility], [address], [place], [place] [address]          Passed - CBC within normal limits and completed in the last 12 months    WBC  Date Value Ref Range Status   10/05/2021 9.4 4.0 - 10.5 K/uL Final   RBC  Date Value Ref Range Status  10/05/2021 3.09 (L) 3.87 - 5.11 MIL/uL Final   RBC.  Date Value Ref Range Status  10/05/2021 3.06 (L) 3.87 - 5.11 MIL/uL Final   Hemoglobin  Date Value Ref Range Status  10/05/2021 10.5 (L) 12.0 - 15.0 g/dL Final  82/42/3536 14.4 (L) 11.1 - 15.9 g/dL Final   HCT  Date Value Ref Range Status  10/05/2021 31.9 (L) 36.0 - 46.0 % Final   Hematocrit  Date Value Ref Range Status  09/13/2021 31.1 (L) 34.0 - 46.6 % Final   MCHC  Date Value Ref Range Status  10/05/2021 32.9 30.0 - 36.0 g/dL Final   [facility]  Date Value Ref Range Status  10/05/2021 34.0 26.0 - 34.0 pg Final   MCV  Date Value Ref Range Status  10/05/2021 103.2 (H) 80.0 - 100.0 fL Final  09/13/2021 98 (H) 79 - 97 fL Final   No results found for: "PLTCOUNTKUC", "LABPLAT", "POCPLA" RDW  Date Value Ref Range Status  10/05/2021 15.8 (H) 11.5 - 15.5 % Final  09/13/2021 14.4 11.7 - 15.4 % Final          omeprazole (PRILOSEC) 20 MG capsule 30 capsule 0    Sig: Take 1 capsule (20 mg total) by mouth daily. Please schedule PCP visit for more refills.     Gastroenterology: Proton Pump Inhibitors Passed - 05/11/2022  7:03 PM      Passed - Valid encounter within last 12 months    Recent Outpatient Visits           8 months ago Essential hypertension   [facility] [place], [place] W, NP   11 months ago Uncontrolled type 2 diabetes mellitus with hyperglycemia [facility])   [place] [facility] [place], [place] W, NP   1 year ago Uncontrolled type 2 diabetes mellitus with hyperglycemia [facility])   [place] [facility] [place], Shea Stakes, NP   1 year ago Uncontrolled type 2 diabetes mellitus with hyperglycemia [facility])   [facility] [place], [place], [place]   2 years ago Uncontrolled type 2 diabetes mellitus with hyperglycemia [facility])   [facility]  [facility] [place], Shea Stakes, NP

## 2022-05-13 ENCOUNTER — Telehealth: Payer: Self-pay | Admitting: Nurse Practitioner

## 2022-05-13 NOTE — Telephone Encounter (Signed)
Christina Clark (daughter) [phone] is calling to schedule appt with financial counselor. Please advise

## 2022-05-16 ENCOUNTER — Other Ambulatory Visit: Payer: Self-pay

## 2022-06-08 ENCOUNTER — Other Ambulatory Visit: Payer: Self-pay | Admitting: Family Medicine

## 2022-06-08 ENCOUNTER — Encounter: Payer: Self-pay | Admitting: Oncology

## 2022-06-08 ENCOUNTER — Other Ambulatory Visit: Payer: Self-pay

## 2022-06-08 DIAGNOSIS — E1165 Type 2 diabetes mellitus with hyperglycemia: Secondary | ICD-10-CM

## 2022-06-08 DIAGNOSIS — I10 Essential (primary) hypertension: Secondary | ICD-10-CM

## 2022-06-08 DIAGNOSIS — R1013 Epigastric pain: Secondary | ICD-10-CM

## 2022-06-08 MED ORDER — LOSARTAN POTASSIUM 50 MG PO TABS
50.0000 mg | ORAL_TABLET | Freq: Every day | ORAL | 0 refills | Status: DC
  Filled 2022-06-08: qty 30, 30d supply, fill #0

## 2022-06-08 MED ORDER — OMEPRAZOLE 20 MG PO CPDR
20.0000 mg | DELAYED_RELEASE_CAPSULE | Freq: Every day | ORAL | 0 refills | Status: DC
  Filled 2022-06-08: qty 30, 30d supply, fill #0

## 2022-06-08 MED ORDER — HYDROCHLOROTHIAZIDE 25 MG PO TABS
12.5000 mg | ORAL_TABLET | Freq: Every day | ORAL | 0 refills | Status: DC
  Filled 2022-06-08 – 2022-06-09 (×2): qty 15, 30d supply, fill #0

## 2022-06-08 MED ORDER — METFORMIN HCL 500 MG PO TABS
500.0000 mg | ORAL_TABLET | Freq: Two times a day (BID) | ORAL | 0 refills | Status: DC
  Filled 2022-06-08: qty 60, 30d supply, fill #0

## 2022-06-09 ENCOUNTER — Other Ambulatory Visit: Payer: Self-pay

## 2022-06-09 ENCOUNTER — Encounter: Payer: Self-pay | Admitting: Oncology

## 2022-06-10 ENCOUNTER — Other Ambulatory Visit: Payer: Self-pay

## 2022-07-04 ENCOUNTER — Other Ambulatory Visit: Payer: Self-pay

## 2022-07-04 ENCOUNTER — Other Ambulatory Visit: Payer: Self-pay | Admitting: Family Medicine

## 2022-07-04 ENCOUNTER — Encounter: Payer: Self-pay | Admitting: Oncology

## 2022-07-04 DIAGNOSIS — R1013 Epigastric pain: Secondary | ICD-10-CM

## 2022-07-04 DIAGNOSIS — I10 Essential (primary) hypertension: Secondary | ICD-10-CM

## 2022-07-04 DIAGNOSIS — E1165 Type 2 diabetes mellitus with hyperglycemia: Secondary | ICD-10-CM

## 2022-07-04 MED ORDER — METFORMIN HCL 500 MG PO TABS
500.0000 mg | ORAL_TABLET | Freq: Two times a day (BID) | ORAL | 0 refills | Status: DC
  Filled 2022-07-04: qty 60, 30d supply, fill #0

## 2022-07-04 MED ORDER — LOSARTAN POTASSIUM 50 MG PO TABS
50.0000 mg | ORAL_TABLET | Freq: Every day | ORAL | 0 refills | Status: DC
  Filled 2022-07-04: qty 30, 30d supply, fill #0

## 2022-07-04 MED ORDER — OMEPRAZOLE 20 MG PO CPDR
20.0000 mg | DELAYED_RELEASE_CAPSULE | Freq: Every day | ORAL | 0 refills | Status: DC
  Filled 2022-07-04: qty 30, 30d supply, fill #0

## 2022-07-04 MED ORDER — HYDROCHLOROTHIAZIDE 25 MG PO TABS
12.5000 mg | ORAL_TABLET | Freq: Every day | ORAL | 0 refills | Status: DC
  Filled 2022-07-04: qty 15, 30d supply, fill #0

## 2022-07-05 ENCOUNTER — Other Ambulatory Visit: Payer: Self-pay

## 2022-07-06 ENCOUNTER — Other Ambulatory Visit: Payer: Self-pay

## 2022-07-14 ENCOUNTER — Other Ambulatory Visit: Payer: Self-pay | Admitting: Family Medicine

## 2022-07-14 ENCOUNTER — Other Ambulatory Visit: Payer: Self-pay | Admitting: Nurse Practitioner

## 2022-07-14 DIAGNOSIS — E785 Hyperlipidemia, unspecified: Secondary | ICD-10-CM

## 2022-07-14 DIAGNOSIS — F3341 Major depressive disorder, recurrent, in partial remission: Secondary | ICD-10-CM

## 2022-07-15 ENCOUNTER — Encounter: Payer: Self-pay | Admitting: Nurse Practitioner

## 2022-07-15 ENCOUNTER — Encounter: Payer: Self-pay | Admitting: Oncology

## 2022-07-15 ENCOUNTER — Other Ambulatory Visit: Payer: Self-pay

## 2022-07-15 ENCOUNTER — Ambulatory Visit: Payer: Self-pay | Attending: Nurse Practitioner | Admitting: Nurse Practitioner

## 2022-07-15 VITALS — BP 134/73 | HR 66 | Ht 63.0 in | Wt 232.2 lb

## 2022-07-15 DIAGNOSIS — G8929 Other chronic pain: Secondary | ICD-10-CM

## 2022-07-15 DIAGNOSIS — R1013 Epigastric pain: Secondary | ICD-10-CM

## 2022-07-15 DIAGNOSIS — E785 Hyperlipidemia, unspecified: Secondary | ICD-10-CM

## 2022-07-15 DIAGNOSIS — N1831 Chronic kidney disease, stage 3a: Secondary | ICD-10-CM

## 2022-07-15 DIAGNOSIS — Z23 Encounter for immunization: Secondary | ICD-10-CM

## 2022-07-15 DIAGNOSIS — F3341 Major depressive disorder, recurrent, in partial remission: Secondary | ICD-10-CM

## 2022-07-15 DIAGNOSIS — D631 Anemia in chronic kidney disease: Secondary | ICD-10-CM

## 2022-07-15 DIAGNOSIS — Z1211 Encounter for screening for malignant neoplasm of colon: Secondary | ICD-10-CM

## 2022-07-15 DIAGNOSIS — E1165 Type 2 diabetes mellitus with hyperglycemia: Secondary | ICD-10-CM

## 2022-07-15 DIAGNOSIS — Z6841 Body Mass Index (BMI) 40.0 and over, adult: Secondary | ICD-10-CM

## 2022-07-15 DIAGNOSIS — I10 Essential (primary) hypertension: Secondary | ICD-10-CM

## 2022-07-15 DIAGNOSIS — M25561 Pain in right knee: Secondary | ICD-10-CM

## 2022-07-15 LAB — POCT GLYCOSYLATED HEMOGLOBIN (HGB A1C): Hemoglobin A1C: 6.7 % — AB (ref 4.0–5.6)

## 2022-07-15 MED ORDER — LOSARTAN POTASSIUM 50 MG PO TABS
50.0000 mg | ORAL_TABLET | Freq: Every day | ORAL | 1 refills | Status: DC
  Filled 2022-07-15 – 2022-08-03 (×2): qty 90, 90d supply, fill #0
  Filled 2022-11-01 – 2022-11-02 (×2): qty 90, 90d supply, fill #1

## 2022-07-15 MED ORDER — PRAVASTATIN SODIUM 40 MG PO TABS
40.0000 mg | ORAL_TABLET | Freq: Every day | ORAL | 2 refills | Status: DC
  Filled 2022-07-15: qty 90, 90d supply, fill #0
  Filled 2022-10-09: qty 90, 90d supply, fill #1
  Filled 2023-01-09: qty 90, 90d supply, fill #2

## 2022-07-15 MED ORDER — DICLOFENAC SODIUM 1 % EX GEL
4.0000 g | Freq: Four times a day (QID) | CUTANEOUS | 1 refills | Status: AC
  Filled 2022-07-15: qty 200, 13d supply, fill #0
  Filled 2022-08-03: qty 200, 13d supply, fill #1

## 2022-07-15 MED ORDER — OMEPRAZOLE 20 MG PO CPDR
20.0000 mg | DELAYED_RELEASE_CAPSULE | Freq: Every day | ORAL | 1 refills | Status: DC
  Filled 2022-07-15 – 2022-08-03 (×2): qty 90, 90d supply, fill #0
  Filled 2022-11-01 – 2022-11-02 (×2): qty 90, 90d supply, fill #1

## 2022-07-15 MED ORDER — HYDROCHLOROTHIAZIDE 25 MG PO TABS
12.5000 mg | ORAL_TABLET | Freq: Every day | ORAL | 1 refills | Status: DC
  Filled 2022-07-15 – 2022-08-03 (×2): qty 45, 90d supply, fill #0
  Filled 2022-11-01 – 2022-11-02 (×2): qty 45, 90d supply, fill #1

## 2022-07-15 MED ORDER — METFORMIN HCL 500 MG PO TABS
500.0000 mg | ORAL_TABLET | Freq: Two times a day (BID) | ORAL | 1 refills | Status: DC
  Filled 2022-07-15 – 2022-08-03 (×2): qty 180, 90d supply, fill #0
  Filled 2022-11-01 – 2022-11-02 (×2): qty 180, 90d supply, fill #1

## 2022-07-15 MED ORDER — TRUE METRIX BLOOD GLUCOSE TEST VI STRP
ORAL_STRIP | 2 refills | Status: DC
  Filled 2022-07-15: qty 100, 50d supply, fill #0
  Filled 2022-09-02: qty 100, 50d supply, fill #1
  Filled 2022-10-22: qty 100, 50d supply, fill #2

## 2022-07-15 MED ORDER — SERTRALINE HCL 50 MG PO TABS
50.0000 mg | ORAL_TABLET | Freq: Every day | ORAL | 1 refills | Status: DC
  Filled 2022-07-15: qty 90, 90d supply, fill #0
  Filled 2022-10-09: qty 90, 90d supply, fill #1

## 2022-07-15 MED ORDER — TRUEPLUS LANCETS 28G MISC
3 refills | Status: DC
  Filled 2022-07-15: qty 100, 50d supply, fill #0
  Filled 2022-09-02: qty 100, 50d supply, fill #1
  Filled 2022-10-22: qty 100, 50d supply, fill #2
  Filled 2022-12-12: qty 100, 50d supply, fill #3

## 2022-07-15 NOTE — Progress Notes (Signed)
Cough for 15 days. Right knee pain. Bulge on upper left shoulder.

## 2022-07-15 NOTE — Progress Notes (Signed)
Assessment & Plan:  [name] was seen today for diabetes, hypertension and medication refill.  Diagnoses and all orders for this visit:  Uncontrolled type 2 diabetes mellitus with hyperglycemia (HCC) -     POCT glycosylated hemoglobin (Hb A1C) -     Microalbumin / creatinine urine ratio -     metFORMIN (GLUCOPHAGE) 500 MG tablet; Take 1 tablet (500 mg total) by mouth 2 (two) times daily with a meal. Please fill as a 90 day supply -     glucose blood (TRUE METRIX BLOOD GLUCOSE TEST) test strip; Use as instructed. Check blood glucose level by fingerstick twice per day. -     TRUEplus Lancets 28G MISC; Use as instructed. Check blood glucose level by fingerstick twice per day. E11.65 Continue blood sugar control as discussed in office today, low carbohydrate diet, and regular physical exercise as tolerated, 150 minutes per week (30 min each day, 5 days per week, or 50 min 3 days per week). Keep blood sugar logs with fasting goal of 90-130 mg/dl, post prandial (after you eat) less than 180.  For Hypoglycemia: BS <60 and Hyperglycemia BS >400; contact the clinic ASAP. Annual eye exams and foot exams are recommended.   Essential hypertension -     losartan (COZAAR) 50 MG tablet; Take 1 tablet (50 mg total) by mouth daily. For blood pressure. Please fill as a 90 day supply -     hydrochlorothiazide (HYDRODIURIL) 25 MG tablet; Take 0.5 tablets (12.5 mg total) by mouth daily. For blood pressure. Please fill as a 90 day supply Continue all antihypertensives as prescribed.  Reminded to bring in blood pressure log for follow  up appointment.  RECOMMENDATIONS: DASH/Mediterranean Diets are healthier choices for HTN.    Dyslipidemia, goal LDL below 70 -     pravastatin (PRAVACHOL) 40 MG tablet; Take 1 tablet (40 mg total) by mouth daily. For cholesterol. Please fill as a 90 day supply -     Lipid panel INSTRUCTIONS: Work on a low fat, heart healthy diet and participate in regular aerobic exercise program  by working out at least 150 minutes per week; 5 days a week-30 minutes per day. Avoid red meat/beef/steak,  fried foods. junk foods, sodas, sugary drinks, unhealthy snacking, alcohol and smoking.  Drink at least 80 oz of water per day and monitor your carbohydrate intake daily.    Need for pneumococcal 20-valent conjugate vaccination -     Pneumococcal conjugate vaccine 20-valent  Need for influenza vaccination -     Flu Vaccine QUAD High Dose(Fluad)  Dyspepsia -     omeprazole (PRILOSEC) 20 MG capsule; Take 1 capsule (20 mg total) by mouth daily. FOR HEARTBURN. Please fill as a 90 day supply INSTRUCTIONS: Avoid GERD Triggers: acidic, spicy or fried foods, caffeine, coffee, sodas,  alcohol and chocolate.    Recurrent major depressive disorder, in partial remission (HCC) -     sertraline (ZOLOFT) 50 MG tablet; Take 1 tablet (50 mg total) by mouth daily. For depression. Please fill as a 90 day supply  Colon cancer screening -     Fecal occult blood, imunochemical([facility])  Anemia in stage 3a chronic kidney disease (HCC) -     CMP14+EGFR -     CBC with Differential -     Iron, TIBC and Ferritin Panel  Chronic pain of right knee -     diclofenac Sodium (VOLTAREN) 1 % GEL; Apply 4 g topically 4 (four) times daily. To painful joints  Morbid obesity  with BMI of 40.0-44.9, adult Discussed diet and exercise for person with BMI >41. Instructed: You must burn more calories than you eat. Losing 5 percent of your body weight should be considered a success. In the longer term, losing more than 15 percent of your body weight and staying at this weight is an extremely good result. However, keep in mind that even losing 5 percent of your body weight leads to important health benefits, so try not to get discouraged if you're not able to lose more than this. Will recheck weight in 3-6 months.      Patient has been counseled on age-appropriate routine health concerns for screening and  prevention. These are reviewed and up-to-date. Referrals have been placed accordingly. Immunizations are up-to-date or declined.    Subjective:   Chief Complaint  Patient presents with   Diabetes   Hypertension   Medication Refill   Diabetes Pertinent negatives for hypoglycemia include no dizziness, headaches or seizures. Pertinent negatives for diabetes include no blurred vision, no chest pain and no weight loss.  Hypertension Pertinent negatives include no blurred vision, chest pain, headaches, malaise/fatigue, palpitations or shortness of breath.  Medication Refill Associated symptoms include coughing. Pertinent negatives include no chest pain, fever, headaches, myalgias, nausea or vomiting.   Christina Clark 72 y.o. female presents to office today for follow up to DM and HTN  She is accompanied by her daughter today. VRI was used to communicate directly with patient for the entire encounter including providing detailed patient instructions.     HTN Blood pressure is well controlled with losartan 50 mg daily and HCTZ 25 mg daily. She does not monitor her blood pressure at home.  BP Readings from Last 3 Encounters:  07/15/22 134/73  10/26/21 139/60  10/05/21 109/79    DM 2 Diabetes is well controlled. She is taking meformin 500 mg BID. LDL not at goal. She is not dietary adherent but does endorse taking pravastatin 40 mg daily as prescribed.  Lab Results  Component Value Date   HGBA1C 6.9 (H) 09/13/2021    Lab Results  Component Value Date   HGBA1C 6.9 (H) 09/13/2021    Lab Results  Component Value Date   LDLCALC 140 (H) 09/13/2021    Cough: Patient complains of nonproductive cough.  Symptoms began 10 days ago.  The cough is non-productive, without wheezing, dyspnea or hemoptysis, improving over time and is aggravated by nothing Associated symptoms include: none . Patient does not have new pets. Patient does not have a history of asthma. Patient does not have a  history of environmental allergens. Patient did not have recent travel. Patient does not have a history of smoking. Patient did not have previous a Chest X-ray. Patient has not had a PPD done.    She has a mobile fatty soft tissue mass above the left clavicle. Area is non tender and directly beneath the skin.    Endorses chronic right knee pain with intermittent swelling based on cold weather. She does not take any medication for the pain and denies any injury or trauma.    Review of Systems  Constitutional:  Negative for fever, malaise/fatigue and weight loss.  HENT: Negative.  Negative for nosebleeds.   Eyes: Negative.  Negative for blurred vision, double vision and photophobia.  Respiratory:  Positive for cough. Negative for hemoptysis, sputum production, shortness of breath and wheezing.   Cardiovascular: Negative.  Negative for chest pain, palpitations and leg swelling.  Gastrointestinal: Negative.  Negative  for heartburn, nausea and vomiting.  Musculoskeletal:  Positive for joint pain. Negative for myalgias.  Skin:        lipoma  Neurological: Negative.  Negative for dizziness, focal weakness, seizures and headaches.  Psychiatric/Behavioral: Negative.  Negative for suicidal ideas.     Past Medical History:  Diagnosis Date   Chronic kidney disease    Depression    After husbands death, takes medication   Diabetes mellitus without complication ([place])    Hyperlipidemia    Hypertension     Past Surgical History:  Procedure Laterality Date   cataract surgery      Family History  Problem Relation Age of Onset   Colon cancer Mother    Breast cancer Sister    Heart attack Brother    Diabetes Neg Hx    Hypertension Neg Hx     Social History Reviewed with no changes to be made today.   Outpatient Medications Prior to Visit  Medication Sig Dispense Refill   ferrous sulfate 325 (65 FE) MG EC tablet Take 1 tablet (325 mg total) by mouth 2 (two) times daily with a meal. 60  tablet 2   folic acid (V-R FOLIC ACID) [id number] MCG tablet Take 1 tablet (400 mcg total) by mouth daily. 90 tablet 2   hydrochlorothiazide (HYDRODIURIL) 25 MG tablet Take 0.5 tablets (12.5 mg total) by mouth daily. 15 tablet 0   losartan (COZAAR) 50 MG tablet Take 1 tablet (50 mg total) by mouth daily. 30 tablet 0   metFORMIN (GLUCOPHAGE) 500 MG tablet Take 1 tablet (500 mg total) by mouth 2 (two) times daily with a meal. Must keep upcoming office visit for refills 60 tablet 0   omeprazole (PRILOSEC) 20 MG capsule Take 1 capsule (20 mg total) by mouth daily. Must keep upcoming office visit for refills 30 capsule 0   pravastatin (PRAVACHOL) 40 MG tablet Take 1 tablet (40 mg total) by mouth daily. Please fill as a 90 day supply 90 tablet 2   sertraline (ZOLOFT) 50 MG tablet Take 1 tablet (50 mg total) by mouth daily. Please schedule PCP visit for more refills. 30 tablet 0   azelastine (OPTIVAR) 0.05 % ophthalmic solution Place 1 drop into both eyes daily. (Patient not taking: Reported on 07/15/2022) 6 mL 12   glucose blood (TRUE METRIX BLOOD GLUCOSE TEST) test strip Use as instructed. Check blood glucose level by fingerstick once per day. (Patient not taking: Reported on 07/15/2022) 100 each 2   TRUEplus Lancets 28G MISC USE AS INSTRUCTED. (Patient not taking: Reported on 07/15/2022) 100 each 3   No facility-administered medications prior to visit.    Allergies  Allergen Reactions   Chocolate     Reports vertigo sx   Coffee Bean Extract [Coffea Arabica]        Objective:    BP 134/73   Pulse 66   Ht 5\' 3"  (1.6 m)   Wt 232 lb 3.2 oz (105.3 kg)   SpO2 91%   BMI 41.13 kg/m  Wt Readings from Last 3 Encounters:  07/15/22 232 lb 3.2 oz (105.3 kg)  10/26/21 225 lb (102.1 kg)  10/05/21 226 lb 9.6 oz (102.8 kg)    Physical Exam Vitals and nursing note reviewed.  Constitutional:      Appearance: She is well-developed.  HENT:     Head: Normocephalic and atraumatic.  Cardiovascular:     Rate  and Rhythm: Normal rate and regular rhythm.     Heart sounds: Normal heart  sounds. No murmur heard.    No friction rub. No gallop.  Pulmonary:     Effort: Pulmonary effort is normal. No tachypnea or respiratory distress.     Breath sounds: Normal breath sounds. No decreased breath sounds, wheezing, rhonchi or rales.  Chest:     Chest wall: No tenderness.    Abdominal:     General: Bowel sounds are normal.     Palpations: Abdomen is soft.  Musculoskeletal:        General: Normal range of motion.     Cervical back: Normal range of motion.  Skin:    General: Skin is warm and dry.  Neurological:     Mental Status: She is alert and oriented to person, place, and time.     Coordination: Coordination normal.  Psychiatric:        Behavior: Behavior normal. Behavior is cooperative.        Thought Content: Thought content normal.        Judgment: Judgment normal.         Patient has been counseled extensively about nutrition and exercise as well as the importance of adherence with medications and regular follow-up. The patient was given clear instructions to go to ER or return to medical center if symptoms don't improve, worsen or new problems develop. The patient verbalized understanding.   Follow-up: Return in about 6 months (around 01/15/2023).   Gildardo Pounds, FNP-BC [facility] and [place], [place]   07/15/2022, 5:43 PM

## 2022-07-16 LAB — MICROALBUMIN / CREATININE URINE RATIO

## 2022-07-18 ENCOUNTER — Other Ambulatory Visit: Payer: Self-pay

## 2022-07-18 ENCOUNTER — Encounter: Payer: Self-pay | Admitting: Oncology

## 2022-07-19 ENCOUNTER — Other Ambulatory Visit: Payer: Self-pay

## 2022-07-20 LAB — LIPID PANEL
Chol/HDL Ratio: 4.8 ratio — ABNORMAL HIGH (ref 0.0–4.4)
Cholesterol, Total: 213 mg/dL — ABNORMAL HIGH (ref 100–199)
HDL: 44 mg/dL (ref >39)
LDL Chol Calc (NIH): 138 mg/dL — ABNORMAL HIGH (ref 0–99)
Triglycerides: 171 mg/dL — ABNORMAL HIGH (ref 0–149)
VLDL Cholesterol Cal: 31 mg/dL (ref 5–40)

## 2022-07-20 LAB — CMP14+EGFR
ALT: 24 IU/L (ref 0–32)
AST: 14 IU/L (ref 0–40)
Albumin/Globulin Ratio: 1.3 (ref 1.2–2.2)
Albumin: 4.3 g/dL (ref 3.9–4.9)
Alkaline Phosphatase: 51 IU/L (ref 44–121)
BUN/Creatinine Ratio: 23 (ref 12–28)
BUN: 26 mg/dL (ref 8–27)
Bilirubin Total: 0.3 mg/dL (ref 0.0–1.2)
CO2: 21 mmol/L (ref 20–29)
Calcium: 9.8 mg/dL (ref 8.7–10.3)
Chloride: 103 mmol/L (ref 96–106)
Creatinine, Ser: 1.15 mg/dL — ABNORMAL HIGH (ref 0.57–1.00)
Globulin, Total: 3.2 g/dL (ref 1.5–4.5)
Glucose: 96 mg/dL (ref 70–99)
Potassium: 5.3 mmol/L — ABNORMAL HIGH (ref 3.5–5.2)
Sodium: 139 mmol/L (ref 134–144)
Total Protein: 7.5 g/dL (ref 6.0–8.5)
eGFR: 51 mL/min/{1.73_m2} — ABNORMAL LOW (ref >59)

## 2022-07-20 LAB — CBC WITH DIFFERENTIAL/PLATELET
Basophils Absolute: 0.1 10*3/uL (ref 0.0–0.2)
Basos: 1 %
EOS (ABSOLUTE): 0.3 10*3/uL (ref 0.0–0.4)
Eos: 3 %
Hematocrit: 30.1 % — ABNORMAL LOW (ref 34.0–46.6)
Hemoglobin: 10 g/dL — ABNORMAL LOW (ref 11.1–15.9)
Immature Grans (Abs): 0 10*3/uL (ref 0.0–0.1)
Immature Granulocytes: 0 %
Lymphocytes Absolute: 6.2 10*3/uL — ABNORMAL HIGH (ref 0.7–3.1)
Lymphs: 52 %
MCH: 36 pg — ABNORMAL HIGH (ref 26.6–33.0)
MCHC: 33.2 g/dL (ref 31.5–35.7)
MCV: 108 fL — ABNORMAL HIGH (ref 79–97)
Monocytes Absolute: 1.1 10*3/uL — ABNORMAL HIGH (ref 0.1–0.9)
Monocytes: 9 %
Neutrophils Absolute: 4.2 10*3/uL (ref 1.4–7.0)
Neutrophils: 35 %
Platelets: 303 10*3/uL (ref 150–450)
RBC: 2.78 x10E6/uL — ABNORMAL LOW (ref 3.77–5.28)
RDW: 13.8 % (ref 11.7–15.4)
WBC: 12 10*3/uL — ABNORMAL HIGH (ref 3.4–10.8)

## 2022-07-20 LAB — MICROALBUMIN / CREATININE URINE RATIO

## 2022-07-20 LAB — IRON,TIBC AND FERRITIN PANEL
Ferritin: 177 ng/mL — ABNORMAL HIGH (ref 15–150)
Iron Saturation: 30 % (ref 15–55)
Iron: 84 ug/dL (ref 27–139)
Total Iron Binding Capacity: 284 ug/dL (ref 250–450)
UIBC: 200 ug/dL (ref 118–369)

## 2022-07-20 LAB — SPECIMEN STATUS REPORT

## 2022-08-04 ENCOUNTER — Other Ambulatory Visit: Payer: Self-pay

## 2022-08-04 ENCOUNTER — Encounter: Payer: Self-pay | Admitting: Oncology

## 2022-09-05 ENCOUNTER — Other Ambulatory Visit: Payer: Self-pay

## 2022-09-05 ENCOUNTER — Encounter: Payer: Self-pay | Admitting: Oncology

## 2022-09-07 ENCOUNTER — Other Ambulatory Visit: Payer: Self-pay

## 2022-10-10 ENCOUNTER — Other Ambulatory Visit: Payer: Self-pay

## 2022-10-10 ENCOUNTER — Encounter: Payer: Self-pay | Admitting: Oncology

## 2022-10-12 ENCOUNTER — Encounter: Payer: Self-pay | Admitting: Oncology

## 2022-10-12 ENCOUNTER — Other Ambulatory Visit: Payer: Self-pay

## 2022-10-13 ENCOUNTER — Other Ambulatory Visit: Payer: Self-pay

## 2022-10-14 ENCOUNTER — Other Ambulatory Visit: Payer: Self-pay

## 2022-10-24 ENCOUNTER — Encounter: Payer: Self-pay | Admitting: Oncology

## 2022-10-24 ENCOUNTER — Other Ambulatory Visit: Payer: Self-pay

## 2022-10-27 ENCOUNTER — Other Ambulatory Visit: Payer: Self-pay

## 2022-11-02 ENCOUNTER — Encounter: Payer: Self-pay | Admitting: Oncology

## 2022-11-02 ENCOUNTER — Other Ambulatory Visit: Payer: Self-pay

## 2022-11-04 ENCOUNTER — Other Ambulatory Visit: Payer: Self-pay

## 2022-11-11 ENCOUNTER — Other Ambulatory Visit: Payer: Self-pay

## 2022-12-12 ENCOUNTER — Other Ambulatory Visit: Payer: Self-pay

## 2022-12-12 ENCOUNTER — Encounter: Payer: Self-pay | Admitting: Oncology

## 2022-12-13 ENCOUNTER — Other Ambulatory Visit: Payer: Self-pay

## 2023-01-07 ENCOUNTER — Other Ambulatory Visit: Payer: Self-pay | Admitting: Nurse Practitioner

## 2023-01-07 DIAGNOSIS — F3341 Major depressive disorder, recurrent, in partial remission: Secondary | ICD-10-CM

## 2023-01-09 ENCOUNTER — Other Ambulatory Visit: Payer: Self-pay

## 2023-01-09 ENCOUNTER — Encounter: Payer: Self-pay | Admitting: Oncology

## 2023-01-09 MED ORDER — SERTRALINE HCL 50 MG PO TABS
50.0000 mg | ORAL_TABLET | Freq: Every day | ORAL | 1 refills | Status: DC
  Filled 2023-01-09: qty 90, 90d supply, fill #0
  Filled 2023-04-10 (×2): qty 90, 90d supply, fill #1

## 2023-01-10 ENCOUNTER — Other Ambulatory Visit: Payer: Self-pay

## 2023-01-20 ENCOUNTER — Encounter: Payer: Self-pay | Admitting: Oncology

## 2023-01-20 ENCOUNTER — Encounter: Payer: Self-pay | Admitting: Nurse Practitioner

## 2023-01-20 ENCOUNTER — Other Ambulatory Visit: Payer: Self-pay

## 2023-01-20 ENCOUNTER — Ambulatory Visit: Payer: Self-pay | Attending: Nurse Practitioner | Admitting: Nurse Practitioner

## 2023-01-20 VITALS — BP 124/70 | HR 71 | Ht 63.0 in | Wt 232.0 lb

## 2023-01-20 DIAGNOSIS — E1165 Type 2 diabetes mellitus with hyperglycemia: Secondary | ICD-10-CM

## 2023-01-20 DIAGNOSIS — Z7984 Long term (current) use of oral hypoglycemic drugs: Secondary | ICD-10-CM

## 2023-01-20 DIAGNOSIS — M25561 Pain in right knee: Secondary | ICD-10-CM

## 2023-01-20 DIAGNOSIS — R1013 Epigastric pain: Secondary | ICD-10-CM

## 2023-01-20 DIAGNOSIS — G8929 Other chronic pain: Secondary | ICD-10-CM

## 2023-01-20 DIAGNOSIS — Z1231 Encounter for screening mammogram for malignant neoplasm of breast: Secondary | ICD-10-CM

## 2023-01-20 DIAGNOSIS — I10 Essential (primary) hypertension: Secondary | ICD-10-CM

## 2023-01-20 DIAGNOSIS — E785 Hyperlipidemia, unspecified: Secondary | ICD-10-CM

## 2023-01-20 DIAGNOSIS — M25562 Pain in left knee: Secondary | ICD-10-CM

## 2023-01-20 DIAGNOSIS — D649 Anemia, unspecified: Secondary | ICD-10-CM

## 2023-01-20 LAB — POCT GLYCOSYLATED HEMOGLOBIN (HGB A1C): HbA1c, POC (prediabetic range): 6.4 % (ref 5.7–6.4)

## 2023-01-20 MED ORDER — TRUEPLUS LANCETS 28G MISC
3 refills | Status: DC
  Filled 2023-01-20 – 2023-01-30 (×2): qty 100, 50d supply, fill #0
  Filled 2023-03-21: qty 100, 50d supply, fill #1
  Filled 2023-05-07 – 2023-05-09 (×3): qty 100, 50d supply, fill #2
  Filled 2023-06-26: qty 100, 50d supply, fill #3

## 2023-01-20 MED ORDER — HYDROCHLOROTHIAZIDE 25 MG PO TABS
12.5000 mg | ORAL_TABLET | Freq: Every day | ORAL | 1 refills | Status: DC
  Filled 2023-01-20 – 2023-01-30 (×2): qty 45, 90d supply, fill #0
  Filled 2023-05-07 – 2023-05-09 (×3): qty 45, 90d supply, fill #1

## 2023-01-20 MED ORDER — METFORMIN HCL 500 MG PO TABS
500.0000 mg | ORAL_TABLET | Freq: Two times a day (BID) | ORAL | 1 refills | Status: DC
  Filled 2023-01-20 – 2023-01-30 (×2): qty 180, 90d supply, fill #0
  Filled 2023-05-07 – 2023-05-09 (×3): qty 180, 90d supply, fill #1

## 2023-01-20 MED ORDER — OMEPRAZOLE 20 MG PO CPDR
20.0000 mg | DELAYED_RELEASE_CAPSULE | Freq: Every day | ORAL | 1 refills | Status: DC
  Filled 2023-01-20 – 2023-01-30 (×2): qty 90, 90d supply, fill #0
  Filled 2023-05-07 – 2023-05-09 (×3): qty 90, 90d supply, fill #1

## 2023-01-20 MED ORDER — TRUE METRIX BLOOD GLUCOSE TEST VI STRP
ORAL_STRIP | 2 refills | Status: DC
  Filled 2023-01-20: qty 100, 50d supply, fill #0
  Filled 2023-03-06 (×2): qty 100, 50d supply, fill #1
  Filled 2023-04-26 – 2023-04-28 (×2): qty 100, 50d supply, fill #2

## 2023-01-20 MED ORDER — PRAVASTATIN SODIUM 40 MG PO TABS
40.0000 mg | ORAL_TABLET | Freq: Every day | ORAL | 2 refills | Status: DC
  Filled 2023-01-20 – 2023-04-10 (×3): qty 90, 90d supply, fill #0
  Filled 2023-07-06 – 2023-07-07 (×2): qty 90, 90d supply, fill #1

## 2023-01-20 MED ORDER — TRAMADOL HCL 50 MG PO TABS
50.0000 mg | ORAL_TABLET | Freq: Every day | ORAL | 0 refills | Status: DC | PRN
Start: 2023-01-20 — End: 2023-09-20
  Filled 2023-01-20: qty 30, 30d supply, fill #0

## 2023-01-20 MED ORDER — LOSARTAN POTASSIUM 50 MG PO TABS
50.0000 mg | ORAL_TABLET | Freq: Every day | ORAL | 1 refills | Status: DC
  Filled 2023-01-20 – 2023-01-30 (×2): qty 90, 90d supply, fill #0
  Filled 2023-05-07 – 2023-05-09 (×3): qty 90, 90d supply, fill #1

## 2023-01-20 NOTE — Progress Notes (Unsigned)
Assessment & Plan:  Christina Clark was seen today for medical management of chronic issues.  Diagnoses and all orders for this visit:  Uncontrolled type 2 diabetes mellitus with hyperglycemia (HCC) -     POCT glycosylated hemoglobin (Hb A1C) -     metFORMIN (GLUCOPHAGE) 500 MG tablet; Take 1 tablet (500 mg total) by mouth 2 (two) times daily with a meal. -     TRUEplus Lancets 28G MISC; Use as instructed. Check blood glucose level by fingerstick twice per day. E11.65 -     glucose blood (TRUE METRIX BLOOD GLUCOSE TEST) test strip; Use as instructed. Check blood glucose level by fingerstick twice per day. -     CMP14+EGFR Continue blood sugar control as discussed in office today, low carbohydrate diet, and regular physical exercise as tolerated, 150 minutes per week (30 min each day, 5 days per week, or 50 min 3 days per week). Keep blood sugar logs with fasting goal of 90-130 mg/dl, post prandial (after you eat) less than 180.  For Hypoglycemia: BS <60 and Hyperglycemia BS >400; contact the clinic ASAP. Annual eye exams and foot exams are recommended.   Essential hypertension -     hydrochlorothiazide (HYDRODIURIL) 25 MG tablet; Take 0.5 tablets (12.5 mg total) by mouth daily. For blood pressure. -     losartan (COZAAR) 50 MG tablet; Take 1 tablet (50 mg total) by mouth daily. For blood pressure. -     CMP14+EGFR Continue all antihypertensives as prescribed.  Reminded to bring in blood pressure log for follow  up appointment.  RECOMMENDATIONS: DASH/Mediterranean Diets are healthier choices for HTN.    Dyspepsia -     omeprazole (PRILOSEC) 20 MG capsule; Take 1 capsule (20 mg total) by mouth daily. FOR HEARTBURN INSTRUCTIONS: Avoid GERD Triggers: acidic, spicy or fried foods, caffeine, coffee, sodas,  alcohol and chocolate.    Dyslipidemia, goal LDL below 70 -     pravastatin (PRAVACHOL) 40 MG tablet; Take 1 tablet (40 mg total) by mouth daily. For cholesterol. Please fill as a 90 day supply -      Lipid panel INSTRUCTIONS: Work on a low fat, heart healthy diet and participate in regular aerobic exercise program by working out at least 150 minutes per week; 5 days a week-30 minutes per day. Avoid red meat/beef/steak,  fried foods. junk foods, sodas, sugary drinks, unhealthy snacking, alcohol and smoking.  Drink at least 80 oz of water per day and monitor your carbohydrate intake daily.    Chronic pain of both knees -     traMADol (ULTRAM) 50 MG tablet; Take 1 tablet (50 mg total) by mouth daily as needed Work on losing weight to help reduce joint pain. May alternate with heat and ice application for pain relief. May also alternate with acetaminophen and Ibuprofen as prescribed pain relief. Other alternatives include massage, acupuncture and water aerobics.  You must stay active and avoid a sedentary lifestyle.   Anemia, unspecified type -     CBC with Differential  Breast cancer screening by mammogram -     MS 3D SCR MAMMO BILAT BR (aka MM); Future    Patient has been counseled on age-appropriate routine health concerns for screening and prevention. These are reviewed and up-to-date. Referrals have been placed accordingly. Immunizations are up-to-date or declined.    Subjective:   Chief Complaint  Patient presents with   Medical Management of Chronic Issues   HPI Christina Clark 72 y.o. female presents to office  today for follow up to DM and HTN She also has complaints of B/L Knee pain today  Her Daughter is accompanying her today for translating   Bilateral Knee pain R>L. Unrelated to any injury. Taking tylenol and using topical analgesic gel for knee pain. Pain 6-7/10. Worse with sitting to standing and cold temperatures Pain described as a deep aching pressure. Not occurring every day but several times per week.   DM 2 Well controlled and at goal of <6.5. Taking metformin 500 mg BID. LDL not at goal. She is not dietary adherent in regard to high cholesterol  foods.  Lab Results  Component Value Date   HGBA1C 6.4 01/20/2023   Lab Results  Component Value Date   LDLCALC 125 (H) 01/20/2023     HTN Blood pressure is well controlled with hydrochlorothiazide 25 mg daily and losartan 50 mg daily.  BP Readings from Last 3 Encounters:  01/20/23 124/70  07/15/22 134/73  10/26/21 139/60    Endorses GERD symptoms but has not been taking her omeprazole as prescribed.  Review of Systems  Constitutional:  Negative for fever, malaise/fatigue and weight loss.  HENT: Negative.  Negative for nosebleeds.   Eyes: Negative.  Negative for blurred vision, double vision and photophobia.  Respiratory: Negative.  Negative for cough and shortness of breath.   Cardiovascular: Negative.  Negative for chest pain, palpitations and leg swelling.  Gastrointestinal: Negative.  Negative for heartburn, nausea and vomiting.  Musculoskeletal:  Positive for joint pain. Negative for myalgias.  Neurological: Negative.  Negative for dizziness, focal weakness, seizures and headaches.  Psychiatric/Behavioral: Negative.  Negative for suicidal ideas.     Past Medical History:  Diagnosis Date   Chronic kidney disease    Depression    After husbands death, takes medication   Diabetes mellitus without complication (HCC)    Hyperlipidemia    Hypertension     Past Surgical History:  Procedure Laterality Date   cataract surgery      Family History  Problem Relation Age of Onset   Colon cancer Mother    Breast cancer Sister    Heart attack Brother    Diabetes Neg Hx    Hypertension Neg Hx     Social History Reviewed with no changes to be made today.   Outpatient Medications Prior to Visit  Medication Sig Dispense Refill   ferrous sulfate 325 (65 FE) MG EC tablet Take 1 tablet (325 mg total) by mouth 2 (two) times daily with a meal. 60 tablet 2   folic acid (V-R FOLIC ACID) 400 MCG tablet Take 1 tablet (400 mcg total) by mouth daily. 90 tablet 2   sertraline  (ZOLOFT) 50 MG tablet Take 1 tablet (50 mg total) by mouth daily. For depression. 90 tablet 1   glucose blood (TRUE METRIX BLOOD GLUCOSE TEST) test strip Use as instructed. Check blood glucose level by fingerstick twice per day. 100 each 2   hydrochlorothiazide (HYDRODIURIL) 25 MG tablet Take 0.5 tablets (12.5 mg total) by mouth daily. For blood pressure. 45 tablet 1   losartan (COZAAR) 50 MG tablet Take 1 tablet (50 mg total) by mouth daily. For blood pressure. 90 tablet 1   metFORMIN (GLUCOPHAGE) 500 MG tablet Take 1 tablet (500 mg total) by mouth 2 (two) times daily with a meal. 180 tablet 1   omeprazole (PRILOSEC) 20 MG capsule Take 1 capsule (20 mg total) by mouth daily. FOR HEARTBURN. 90 capsule 1   pravastatin (PRAVACHOL) 40 MG tablet Take  1 tablet (40 mg total) by mouth daily. For cholesterol. Please fill as a 90 day supply 90 tablet 2   TRUEplus Lancets 28G MISC Use as instructed. Check blood glucose level by fingerstick twice per day. E11.65 100 each 3   No facility-administered medications prior to visit.    Allergies  Allergen Reactions   Chocolate     Reports vertigo sx   Coffee Bean Extract [Coffea Arabica]        Objective:    BP 124/70 (BP Location: Left Arm, Patient Position: Sitting, Cuff Size: Large)   Pulse 71   Ht 5\' 3"  (1.6 m)   Wt 232 lb (105.2 kg)   SpO2 93%   BMI 41.10 kg/m  Wt Readings from Last 3 Encounters:  01/20/23 232 lb (105.2 kg)  07/15/22 232 lb 3.2 oz (105.3 kg)  10/26/21 225 lb (102.1 kg)    Physical Exam Vitals and nursing note reviewed.  Constitutional:      Appearance: She is well-developed.  HENT:     Head: Normocephalic and atraumatic.  Cardiovascular:     Rate and Rhythm: Normal rate and regular rhythm.     Heart sounds: Normal heart sounds. No murmur heard.    No friction rub. No gallop.  Pulmonary:     Effort: Pulmonary effort is normal. No tachypnea or respiratory distress.     Breath sounds: Normal breath sounds. No  decreased breath sounds, wheezing, rhonchi or rales.  Chest:     Chest wall: No tenderness.  Abdominal:     General: Bowel sounds are normal.     Palpations: Abdomen is soft.  Musculoskeletal:        General: Normal range of motion.     Cervical back: Normal range of motion.  Skin:    General: Skin is warm and dry.  Neurological:     Mental Status: She is alert and oriented to person, place, and time.     Coordination: Coordination normal.  Psychiatric:        Behavior: Behavior normal. Behavior is cooperative.        Thought Content: Thought content normal.        Judgment: Judgment normal.          Patient has been counseled extensively about nutrition and exercise as well as the importance of adherence with medications and regular follow-up. The patient was given clear instructions to go to ER or return to medical center if symptoms don't improve, worsen or new problems develop. The patient verbalized understanding.   Follow-up: Return in about 3 months (around 04/21/2023).   Claiborne Rigg, FNP-BC [facility] [place], [place] [phone]   01/26/2023, 12:54 PM

## 2023-01-20 NOTE — Progress Notes (Unsigned)
Bump on left nostril  Left knee pain

## 2023-01-21 LAB — LIPID PANEL
Chol/HDL Ratio: 5.4 ratio — ABNORMAL HIGH (ref 0.0–4.4)
Cholesterol, Total: 201 mg/dL — ABNORMAL HIGH (ref 100–199)
HDL: 37 mg/dL — ABNORMAL LOW (ref >39)
LDL Chol Calc (NIH): 125 mg/dL — ABNORMAL HIGH (ref 0–99)
Triglycerides: 217 mg/dL — ABNORMAL HIGH (ref 0–149)
VLDL Cholesterol Cal: 39 mg/dL (ref 5–40)

## 2023-01-21 LAB — CBC WITH DIFFERENTIAL/PLATELET
Basophils Absolute: 0.1 10*3/uL (ref 0.0–0.2)
Basos: 1 %
EOS (ABSOLUTE): 0.4 10*3/uL (ref 0.0–0.4)
Eos: 4 %
Hematocrit: 31.5 % — ABNORMAL LOW (ref 34.0–46.6)
Hemoglobin: 10.3 g/dL — ABNORMAL LOW (ref 11.1–15.9)
Immature Grans (Abs): 0 10*3/uL (ref 0.0–0.1)
Immature Granulocytes: 0 %
Lymphocytes Absolute: 5.9 10*3/uL — ABNORMAL HIGH (ref 0.7–3.1)
Lymphs: 53 %
MCH: 34.9 pg — ABNORMAL HIGH (ref 26.6–33.0)
MCHC: 32.7 g/dL (ref 31.5–35.7)
MCV: 107 fL — ABNORMAL HIGH (ref 79–97)
Monocytes Absolute: 1 10*3/uL — ABNORMAL HIGH (ref 0.1–0.9)
Monocytes: 9 %
Neutrophils Absolute: 3.6 10*3/uL (ref 1.4–7.0)
Neutrophils: 33 %
Platelets: 284 10*3/uL (ref 150–450)
RBC: 2.95 x10E6/uL — ABNORMAL LOW (ref 3.77–5.28)
RDW: 13.6 % (ref 11.7–15.4)
WBC: 11 10*3/uL — ABNORMAL HIGH (ref 3.4–10.8)

## 2023-01-21 LAB — CMP14+EGFR
ALT: 36 IU/L — ABNORMAL HIGH (ref 0–32)
AST: 22 IU/L (ref 0–40)
Albumin: 4.1 g/dL (ref 3.9–4.9)
Alkaline Phosphatase: 44 IU/L (ref 44–121)
BUN/Creatinine Ratio: 17 (ref 12–28)
BUN: 22 mg/dL (ref 8–27)
Bilirubin Total: 0.2 mg/dL (ref 0.0–1.2)
CO2: 23 mmol/L (ref 20–29)
Calcium: 9.7 mg/dL (ref 8.7–10.3)
Chloride: 107 mmol/L — ABNORMAL HIGH (ref 96–106)
Creatinine, Ser: 1.27 mg/dL — ABNORMAL HIGH (ref 0.57–1.00)
Globulin, Total: 3.5 g/dL (ref 1.5–4.5)
Glucose: 102 mg/dL — ABNORMAL HIGH (ref 70–99)
Potassium: 5.2 mmol/L (ref 3.5–5.2)
Sodium: 139 mmol/L (ref 134–144)
Total Protein: 7.6 g/dL (ref 6.0–8.5)
eGFR: 45 mL/min/{1.73_m2} — ABNORMAL LOW (ref >59)

## 2023-01-26 ENCOUNTER — Encounter: Payer: Self-pay | Admitting: Nurse Practitioner

## 2023-01-30 ENCOUNTER — Encounter: Payer: Self-pay | Admitting: Oncology

## 2023-01-30 ENCOUNTER — Other Ambulatory Visit: Payer: Self-pay

## 2023-01-31 ENCOUNTER — Other Ambulatory Visit: Payer: Self-pay | Admitting: Obstetrics and Gynecology

## 2023-01-31 DIAGNOSIS — Z1231 Encounter for screening mammogram for malignant neoplasm of breast: Secondary | ICD-10-CM

## 2023-02-01 ENCOUNTER — Other Ambulatory Visit: Payer: Self-pay

## 2023-03-06 ENCOUNTER — Encounter: Payer: Self-pay | Admitting: Oncology

## 2023-03-06 ENCOUNTER — Other Ambulatory Visit: Payer: Self-pay

## 2023-03-08 ENCOUNTER — Other Ambulatory Visit: Payer: Self-pay

## 2023-03-09 NOTE — Progress Notes (Signed)
Cardiology Office Note:    Date:  03/10/2023   ID:  Christina Clark, Christina Clark 06-04-1950, MRN [id number]  PCP:  Claiborne Rigg, NP  [facility] Cardiologist:  Lorine Bears, MD  [facility] Electrophysiologist:  None   Referring MD: Claiborne Rigg, NP   Chief Complaint: 1 year follow-up  History of Present Illness:    Christina Clark is a 72 y.o. female with a hx of left bundle branch block, obesity, diabetes type 2, hypertension, hyperlipidemia, and depression who presents for 1 year follow-up.  Patient was noted to have a cardiac murmur and she was referred for workup in 2023.  On review it appeared she had left bundle branch block since 2020.  Echo showed LVEF 55 to 60%, G2DD, moderately elevated pulmonary artery systolic pressure, mild MR, mild AS, trivial AI, moderate TR. bilateral carotid ultrasound showed no significant stenosis.  Patient was last seen in March 2023 and was overall doing well  Today, the patient is overall doing well. She denies chest pain, SOB, LLE. She denies dizziness or lightheadedness. She denies palpitations. BP is a little high today, but previous blood pressures through out the year are better. She was in [place] for about 4 months during the year and is very liberal with her diet at that time. She does no activity or walking at baseline. She struggles with anxiety and is on medication for this.   Past Medical History:  Diagnosis Date   Chronic kidney disease    Depression    After husbands death, takes medication   Diabetes mellitus without complication (HCC)    Hyperlipidemia    Hypertension     Past Surgical History:  Procedure Laterality Date   cataract surgery      Current Medications: Current Meds  Medication Sig   ferrous sulfate 325 (65 FE) MG EC tablet Take 1 tablet (325 mg total) by mouth 2 (two) times daily with a meal.   folic acid (V-R FOLIC ACID) 400 MCG tablet Take 1 tablet (400 mcg total) by mouth daily.    glucose blood (TRUE METRIX BLOOD GLUCOSE TEST) test strip Use as instructed. Check blood glucose level by fingerstick twice per day.   hydrochlorothiazide (HYDRODIURIL) 25 MG tablet Take 0.5 tablets (12.5 mg total) by mouth daily. For blood pressure.   losartan (COZAAR) 50 MG tablet Take 1 tablet (50 mg total) by mouth daily. For blood pressure.   metFORMIN (GLUCOPHAGE) 500 MG tablet Take 1 tablet (500 mg total) by mouth 2 (two) times daily with a meal.   omeprazole (PRILOSEC) 20 MG capsule Take 1 capsule (20 mg total) by mouth daily. FOR HEARTBURN.   pravastatin (PRAVACHOL) 40 MG tablet Take 1 tablet (40 mg total) by mouth daily. For cholesterol. Please fill as a 90 day supply   sertraline (ZOLOFT) 50 MG tablet Take 1 tablet (50 mg total) by mouth daily. For depression.   traMADol (ULTRAM) 50 MG tablet Take 1 tablet (50 mg total) by mouth daily as needed.   TRUEplus Lancets 28G MISC Use as instructed. Check blood glucose level by fingerstick twice per day. E11.65     Allergies:   Chocolate and Coffee bean extract [coffea arabica]   Social History   Socioeconomic History   Marital status: Widowed    Spouse name: Not on file   Number of children: 4   Years of education: Not on file   Highest education level: High school graduate  Occupational History  Not on file  Tobacco Use   Smoking status: Never   Smokeless tobacco: Never  Vaping Use   Vaping status: Never Used  Substance and Sexual Activity   Alcohol use: Not Currently   Drug use: Never   Sexual activity: Not Currently  Other Topics Concern   Not on file  Social History Narrative   Not on file   Social Determinants of Health   Financial Resource Strain: Low Risk  (10/11/2018)   Overall Financial Resource Strain (CARDIA)    Difficulty of Paying Living Expenses: Not hard at all  Food Insecurity: No Food Insecurity (07/13/2021)   Hunger Vital Sign    Worried About Running Out of Food in the Last Year: Never true    Ran  Out of Food in the Last Year: Never true  Transportation Needs: No Transportation Needs (07/13/2021)   PRAPARE - Administrator, Civil Service (Medical): No    Lack of Transportation (Non-Medical): No  Physical Activity: Insufficiently Active (10/11/2018)   Exercise Vital Sign    Days of Exercise per Week: 1 day    Minutes of Exercise per Session: 10 min  Stress: No Stress Concern Present (10/11/2018)   [employer] of Occupational Health - Occupational Stress Questionnaire    Feeling of Stress : Not at all  Social Connections: Somewhat Isolated (10/11/2018)   Social Connection and Isolation Panel [NHANES]    Frequency of Communication with Friends and Family: More than three times a week    Frequency of Social Gatherings with Friends and Family: More than three times a week    Attends Religious Services: 1 to 4 times per year    Active Member of [employer] or Organizations: No    Attends Banker Meetings: Never    Marital Status: Widowed     Family History: The patient's family history includes Breast cancer in her sister; Colon cancer in her mother; Heart attack in her brother. There is no history of Diabetes or Hypertension.  ROS:   Please see the history of present illness.     All other systems reviewed and are negative.  EKGs/Labs/Other Studies Reviewed:    The following studies were reviewed today:  Echo 06/2021  1. Left ventricular ejection fraction, by estimation, is 55 to 60%. Left  ventricular ejection fraction by 2D MOD biplane is 55.1 %. The left  ventricle has normal function. The left ventricle has no regional wall  motion abnormalities. Left ventricular  diastolic parameters are consistent with Grade II diastolic dysfunction  (pseudonormalization).   2. Right ventricular systolic function is normal. The right ventricular  size is normal. There is moderately elevated pulmonary artery systolic  pressure. The estimated right ventricular  systolic pressure is 41.2 mmHg.   3. Left atrial size was mildly dilated.   4. The mitral valve is degenerative. Mild mitral valve regurgitation.   5. Tricuspid valve regurgitation is moderate.   6. Aortic valve mean gradient , Peak gradient , Vmax 2.3m/s.  The aortic valve is calcified. Aortic valve regurgitation is trivial. Mild  aortic valve stenosis.   7. The inferior vena cava is normal in size with greater than 50%  respiratory variability, suggesting right atrial pressure of 3 mmHg.    EKG:  EKG is ordered today.  The ekg ordered today demonstrates NSR LBBB, 68bpm, LAD  Recent Labs: 01/20/2023: ALT 36; BUN 22; Creatinine, Ser 1.27; Hemoglobin 10.3; Platelets 284; Potassium 5.2; Sodium 139  Recent Lipid Panel  Component Value Date/Time   CHOL 201 (H) 01/20/2023 1616   TRIG 217 (H) 01/20/2023 1616   HDL 37 (L) 01/20/2023 1616   CHOLHDL 5.4 (H) 01/20/2023 1616   LDLCALC 125 (H) 01/20/2023 1616     Physical Exam:    VS:  BP (!) 154/64 (BP Location: Left Arm, Patient Position: Sitting, Cuff Size: Large)   Pulse 68   Ht 5\' 4"  (1.626 m)   Wt 234 lb 3.2 oz (106.2 kg)   SpO2 95%   BMI 40.20 kg/m     Wt Readings from Last 3 Encounters:  03/10/23 234 lb 3.2 oz (106.2 kg)  01/20/23 232 lb (105.2 kg)  07/15/22 232 lb 3.2 oz (105.3 kg)     GEN:  Well nourished, well developed in no acute distress HEENT: Normal NECK: No JVD; No carotid bruits LYMPHATICS: No lymphadenopathy CARDIAC: RRR, + murmur, no rubs, gallops RESPIRATORY:  Clear to auscultation without rales, wheezing or rhonchi  ABDOMEN: Soft, non-tender, non-distended MUSCULOSKELETAL:  No edema; No deformity  SKIN: Warm and dry NEUROLOGIC:  Alert and oriented x 3 PSYCHIATRIC:  Normal affect   ASSESSMENT:    1. Murmur   2. Essential hypertension   3. Hyperlipidemia, mixed    PLAN:    In order of problems listed above:  Mild AS Echo in 2023 showed LVEF 55-60%, no WMA, G2DD, normal RV  function, mild MR and mild AS. Murmur on exam. Patient has no signs of decompensation. Plan on repeat echo in 2025.   HTN BP mildly elevated today, but better upon review of blood pressures throughout the year. Lifestyle changes were discussed in detail today. Continue Losartan 50mg  daily and hydrochlorothiazide 12.5mg  daily.   HLD LDL 125, total chol 205, TG 217, HDL 37. Continue Pravastatin 40mg  daily. Lifestyle changes discussed as above. The patient spends a couple months a year in [place] and is very liberal with her diet at that time. ASCVD risk is 36.3%. Would consider changing to high intensity statin.   Disposition: Follow up in 1 year(s) with MD/APP    Signed, Tangela Dolliver David Stall, PA-C  03/10/2023 3:42 PM    [facility]

## 2023-03-10 ENCOUNTER — Ambulatory Visit: Payer: Self-pay | Attending: Medical | Admitting: Medical

## 2023-03-10 ENCOUNTER — Encounter: Payer: Self-pay | Admitting: Medical

## 2023-03-10 VITALS — BP 154/64 | HR 68 | Ht 64.0 in | Wt 234.2 lb

## 2023-03-10 DIAGNOSIS — E782 Mixed hyperlipidemia: Secondary | ICD-10-CM

## 2023-03-10 DIAGNOSIS — R011 Cardiac murmur, unspecified: Secondary | ICD-10-CM

## 2023-03-10 DIAGNOSIS — I10 Essential (primary) hypertension: Secondary | ICD-10-CM

## 2023-03-10 NOTE — Patient Instructions (Signed)
Medication Instructions:  Your physician recommends that you continue on your current medications as directed. Please refer to the Current Medication list given to you today.   *If you need a refill on your cardiac medications before your next appointment, please call your pharmacy*   Lab Work: No labs ordered today    Testing/Procedures: No test ordered today    Follow-Up: At [facility], you and your health needs are our priority.  As part of our continuing mission to provide you with exceptional heart care, we have created designated Provider Care Teams.  These Care Teams include your primary Cardiologist (physician) and Advanced Practice Providers (APPs -  Physician Assistants and Nurse Practitioners) who all work together to provide you with the care you need, when you need it.  We recommend signing up for the patient portal called "MyChart".  Sign up information is provided on this After Visit Summary.  MyChart is used to connect with patients for Virtual Visits (Telemedicine).  Patients are able to view lab/test results, encounter notes, upcoming appointments, etc.  Non-urgent messages can be sent to your provider as well.   To learn more about what you can do with MyChart, go to [email or web address].    Your next appointment:   1 year(s)  Provider:   You may see Lorine Bears, MD or one of the following Advanced Practice Providers on your designated Care Team:   Nicolasa Ducking, NP Eula Listen, PA-C Cadence Fransico Michael, PA-C Charlsie Quest, NP Carlos Levering, NP

## 2023-03-21 ENCOUNTER — Other Ambulatory Visit: Payer: Self-pay

## 2023-03-21 ENCOUNTER — Encounter: Payer: Self-pay | Admitting: Oncology

## 2023-03-23 ENCOUNTER — Other Ambulatory Visit: Payer: Self-pay

## 2023-04-06 ENCOUNTER — Ambulatory Visit: Payer: Self-pay | Admitting: Hematology and Oncology

## 2023-04-06 ENCOUNTER — Ambulatory Visit
Admission: RE | Admit: 2023-04-06 | Discharge: 2023-04-06 | Disposition: A | Discharge status: Home / Self Care | Payer: No Typology Code available for payment source | Source: Ambulatory Visit | Attending: Obstetrics and Gynecology | Admitting: Obstetrics and Gynecology

## 2023-04-06 ENCOUNTER — Encounter: Payer: Self-pay | Admitting: Oncology

## 2023-04-06 VITALS — BP 164/52 | Wt 231.0 lb

## 2023-04-06 DIAGNOSIS — Z1231 Encounter for screening mammogram for malignant neoplasm of breast: Secondary | ICD-10-CM

## 2023-04-06 NOTE — Patient Instructions (Signed)
Taught Christina Clark about self breast awareness and gave educational materials to take home. Patient did not need a Pap smear today due to age over 82. Referred patient to the [facility] for screening mammogram. Appointment scheduled for 04/06/2023. Patient aware of appointment and will be there. Let patient know will follow up with her within the next couple weeks with results. Antony Salmon Reynoso Clark verbalized understanding.  Pascal Lux, NP 9:02 AM

## 2023-04-06 NOTE — Progress Notes (Signed)
Ms. Christina Clark is a 72 y.o. female who presents to [facility] clinic today with no complaints.    Pap Smear: Pap not smear completed today due to age over 11.   Physical exam: Breasts Breasts symmetrical. No skin abnormalities bilateral breasts. No nipple retraction bilateral breasts. No nipple discharge bilateral breasts. No lymphadenopathy. No lumps palpated bilateral breasts.     MS DIGITAL DIAG TOMO BILAT  Result Date: 07/13/2021 CLINICAL DATA:  Delayed follow-up for a likely benign left breast mass. The patient's sister was diagnosed with breast cancer last year. EXAM: DIGITAL DIAGNOSTIC BILATERAL MAMMOGRAM WITH TOMOSYNTHESIS AND CAD; ULTRASOUND LEFT BREAST LIMITED TECHNIQUE: Bilateral digital diagnostic mammography and breast tomosynthesis was performed. The images were evaluated with computer-aided detection.; Targeted ultrasound examination of the left breast was performed. COMPARISON:  Previous exam(s). ACR Breast Density Category b: There are scattered areas of fibroglandular density. FINDINGS: No suspicious calcifications, masses or areas of distortion are seen in the bilateral breasts. Ultrasound targeted to the left breast at 2 o'clock, 4 cm from the nipple demonstrates a small oval hypoechoic mass measuring 3 x 2 x 2 mm, previously measuring 5 x 2 x 3 mm. IMPRESSION: 1. The mass in the left breast at 2 o'clock has demonstrated over 2 years of stability, and is therefore benign. 2. No suspicious calcifications, masses or areas of distortion are seen in the bilateral breasts. RECOMMENDATION: Screening mammogram in one year.(Code:[id number]-01Y) I have discussed the findings and recommendations with the patient. If applicable, a reminder letter will be sent to the patient regarding the next appointment. BI-RADS CATEGORY  2: Benign. Electronically Signed   By: Frederico Hamman M.D.   On: 07/13/2021 13:29   MM DIAG BREAST TOMO UNI LEFT  Result Date: 03/20/2019 CLINICAL DATA:  Patient  presents today recall from screening for a possible left breast asymmetry. EXAM: DIGITAL DIAGNOSTIC LEFT MAMMOGRAM WITH TOMO ULTRASOUND LEFT BREAST COMPARISON:  Previous exam(s). ACR Breast Density Category b: There are scattered areas of fibroglandular density. FINDINGS: Mammogram: Additional spot compression and full field mL views were performed for the questioned asymmetry seen in the superior left breast only on the MLO view. On the additional imaging there is persistence of a 5 mm asymmetry which is best seen on the full field mL view. Ultrasound: Targeted ultrasound is performed in the upper-outer quadrant of the left breast at 2 o'clock 4 cm from the nipple demonstrating an oval circumscribed anechoic mass with internal echoes measuring 0.5 x 0.2 x 0.3 cm, which likely corresponds to the mammographic finding. Targeted ultrasound of the left axilla demonstrates no abnormal appearing lymph nodes. IMPRESSION: Left breast probably benign asymmetry and corresponding 0.5 cm mass on ultrasound, likely representing a complicated cyst. RECOMMENDATION: Diagnostic left breast mammogram and ultrasound in 6 months to confirm stability of the probably benign findings. I have discussed the findings and recommendations with the patient. If applicable, a reminder letter will be sent to the patient regarding the next appointment. BI-RADS CATEGORY  3: Probably benign. Electronically Signed   By: Emmaline Kluver M.D.   On: 03/20/2019 16:13   MM 3D SCREEN BREAST BILATERAL  Result Date: 03/11/2019 CLINICAL DATA:  Screening. EXAM: DIGITAL SCREENING BILATERAL MAMMOGRAM WITH TOMO AND CAD COMPARISON:  None. ACR Breast Density Category b: There are scattered areas of fibroglandular density. FINDINGS: In the left breast, a possible asymmetry warrants further evaluation. In the right breast, no findings suspicious for malignancy. Images were processed with CAD. IMPRESSION: Further evaluation is suggested for  possible asymmetry in  the left breast. RECOMMENDATION: Diagnostic mammogram and possibly ultrasound of the left breast. (Code:[id number]) The patient will be contacted regarding the findings, and additional imaging will be scheduled. BI-RADS CATEGORY  0: Incomplete. Need additional imaging evaluation and/or prior mammograms for comparison. Electronically Signed   By: Elberta Fortis M.D.   On: 03/11/2019 13:33      Pelvic/Bimanual Pap is not indicated today    Smoking History: Patient has never smoked and was not referred to quit line.    Patient Navigation: Patient education provided. Access to services provided for patient through [facility] program. Natale Lay interpreter provided. No transportation provided   Colorectal Cancer Screening: Per patient has never had colonoscopy completed No complaints today.    Breast and Cervical Cancer Risk Assessment: Patient does not have family history of breast cancer, known genetic mutations, or radiation treatment to the chest before age 41. Patient does not have history of cervical dysplasia, immunocompromised, or DES exposure in-utero.  Risk Scores as of Encounter on 04/06/2023     Christina Clark           5-year 2.85%   Lifetime 8.64%            Last calculated by Caprice Red, CMA on 04/06/2023 at  8:26 AM        A: [facility] exam without pap smear No complaints with benign exam.   P: Referred [facility] to [facility] for a screening mammogram. Appointment scheduled 04/06/2023.  Pascal Lux, NP 04/06/2023 8:27 AM

## 2023-04-10 ENCOUNTER — Encounter: Payer: Self-pay | Admitting: Oncology

## 2023-04-10 ENCOUNTER — Other Ambulatory Visit: Payer: Self-pay

## 2023-04-11 ENCOUNTER — Other Ambulatory Visit: Payer: Self-pay

## 2023-04-27 ENCOUNTER — Other Ambulatory Visit: Payer: Self-pay

## 2023-04-28 ENCOUNTER — Encounter: Payer: Self-pay | Admitting: Nurse Practitioner

## 2023-04-28 ENCOUNTER — Ambulatory Visit: Payer: Medicaid Other | Attending: Nurse Practitioner | Admitting: Nurse Practitioner

## 2023-04-28 ENCOUNTER — Encounter: Payer: Self-pay | Admitting: Oncology

## 2023-04-28 ENCOUNTER — Other Ambulatory Visit: Payer: Self-pay

## 2023-04-28 VITALS — BP 119/69 | HR 74 | Ht 64.0 in | Wt 231.2 lb

## 2023-04-28 DIAGNOSIS — I10 Essential (primary) hypertension: Secondary | ICD-10-CM | POA: Diagnosis not present

## 2023-04-28 DIAGNOSIS — E1165 Type 2 diabetes mellitus with hyperglycemia: Secondary | ICD-10-CM | POA: Diagnosis not present

## 2023-04-28 DIAGNOSIS — J069 Acute upper respiratory infection, unspecified: Secondary | ICD-10-CM

## 2023-04-28 DIAGNOSIS — D649 Anemia, unspecified: Secondary | ICD-10-CM | POA: Diagnosis not present

## 2023-04-28 DIAGNOSIS — Z7984 Long term (current) use of oral hypoglycemic drugs: Secondary | ICD-10-CM

## 2023-04-28 NOTE — Progress Notes (Unsigned)
Assessment & Plan:  Christina Clark was seen today for medical management of chronic issues.  Diagnoses and all orders for this visit:  Primary hypertension -     CMP14+EGFR Continue all antihypertensives as prescribed.  Reminded to bring in blood pressure log for follow  up appointment.  RECOMMENDATIONS: DASH/Mediterranean Diets are healthier choices for HTN.    Anemia, unspecified type -     CBC with Differential  Viral URI with cough INSTRUCTIONS: use a humidifier for nasal congestion Drink plenty of fluids, rest and wash hands frequently to avoid the spread of infection Alternate tylenol and Motrin for relief of fever   Uncontrolled type 2 diabetes mellitus with hyperglycemia (HCC) -     Ambulatory referral to Ophthalmology    Patient has been counseled on age-appropriate routine health concerns for screening and prevention. These are reviewed and up-to-date. Referrals have been placed accordingly. Immunizations are up-to-date or declined.    Subjective:   Chief Complaint  Patient presents with   Medical Management of Chronic Issues    Christina Clark 73 y.o. female presents to office today for follow up to HTN.  She has a past medical history of Chronic kidney disease, Depression, DM 2,, Hyperlipidemia, and Hypertension.   She is accompanied by her daughter today who is translating for her.    Blood pressure is well controlled.  She is taking losartan 50 mg daily as prescribed.  BP Readings from Last 3 Encounters:  04/28/23 119/69  04/06/23 (!) 164/52  03/10/23 (!) 154/64     Currently experiencing mild URI symptoms of dry cough which is improved today.    Review of Systems  Constitutional:  Negative for fever, malaise/fatigue and weight loss.  HENT: Negative.  Negative for nosebleeds.   Eyes: Negative.  Negative for blurred vision, double vision and photophobia.  Respiratory:  Positive for cough. Negative for shortness of breath.   Cardiovascular:  Negative.  Negative for chest pain, palpitations and leg swelling.  Gastrointestinal: Negative.  Negative for heartburn, nausea and vomiting.  Musculoskeletal: Negative.  Negative for myalgias.  Neurological: Negative.  Negative for dizziness, focal weakness, seizures and headaches.  Psychiatric/Behavioral: Negative.  Negative for suicidal ideas.     Past Medical History:  Diagnosis Date   Chronic kidney disease    Depression    After husbands death, takes medication   Diabetes mellitus without complication (HCC)    Hyperlipidemia    Hypertension     Past Surgical History:  Procedure Laterality Date   cataract surgery      Family History  Problem Relation Age of Onset   Colon cancer Mother    Breast cancer Sister 12   Heart attack Brother    Diabetes Neg Hx    Hypertension Neg Hx     Social History Reviewed with no changes to be made today.   Outpatient Medications Prior to Visit  Medication Sig Dispense Refill   ferrous sulfate 325 (65 FE) MG EC tablet Take 1 tablet (325 mg total) by mouth 2 (two) times daily with a meal. 60 tablet 2   folic acid (V-R FOLIC ACID) 400 MCG tablet Take 1 tablet (400 mcg total) by mouth daily. 90 tablet 2   glucose blood (TRUE METRIX BLOOD GLUCOSE TEST) test strip Use as instructed. Check blood glucose level by fingerstick twice per day. 100 each 2   hydrochlorothiazide (HYDRODIURIL) 25 MG tablet Take 0.5 tablets (12.5 mg total) by mouth daily. For blood pressure. 45 tablet 1  losartan (COZAAR) 50 MG tablet Take 1 tablet (50 mg total) by mouth daily. For blood pressure. 90 tablet 1   metFORMIN (GLUCOPHAGE) 500 MG tablet Take 1 tablet (500 mg total) by mouth 2 (two) times daily with a meal. 180 tablet 1   omeprazole (PRILOSEC) 20 MG capsule Take 1 capsule (20 mg total) by mouth daily. FOR HEARTBURN. 90 capsule 1   pravastatin (PRAVACHOL) 40 MG tablet Take 1 tablet (40 mg total) by mouth daily. For cholesterol. Please fill as a 90 day supply 90  tablet 2   sertraline (ZOLOFT) 50 MG tablet Take 1 tablet (50 mg total) by mouth daily. For depression. 90 tablet 1   TRUEplus Lancets 28G MISC Use as instructed. Check blood glucose level by fingerstick twice per day. E11.65 100 each 3   traMADol (ULTRAM) 50 MG tablet Take 1 tablet (50 mg total) by mouth daily as needed. (Patient not taking: Reported on 04/28/2023) 30 tablet 0   No facility-administered medications prior to visit.    Allergies  Allergen Reactions   Chocolate     Reports vertigo sx   Coffee Bean Extract [Coffea Arabica]        Objective:    BP 119/69 (BP Location: Left Arm, Patient Position: Sitting, Cuff Size: Large)   Pulse 74   Ht 5\' 4"  (1.626 m)   Wt 231 lb 3.2 oz (104.9 kg)   SpO2 93%   BMI 39.69 kg/m  Wt Readings from Last 3 Encounters:  04/28/23 231 lb 3.2 oz (104.9 kg)  04/06/23 231 lb (104.8 kg)  03/10/23 234 lb 3.2 oz (106.2 kg)    Physical Exam Vitals and nursing note reviewed.  Constitutional:      Appearance: She is well-developed.  HENT:     Head: Normocephalic and atraumatic.  Cardiovascular:     Rate and Rhythm: Normal rate and regular rhythm.     Heart sounds: Murmur heard.     No friction rub. No gallop.  Pulmonary:     Effort: Pulmonary effort is normal. No tachypnea or respiratory distress.     Breath sounds: Normal breath sounds. No decreased breath sounds, wheezing, rhonchi or rales.  Chest:     Chest wall: No tenderness.  Abdominal:     General: Bowel sounds are normal.     Palpations: Abdomen is soft.  Musculoskeletal:        General: Normal range of motion.     Cervical back: Normal range of motion.  Skin:    General: Skin is warm and dry.  Neurological:     Mental Status: She is alert and oriented to person, place, and time.     Coordination: Coordination normal.  Psychiatric:        Behavior: Behavior normal. Behavior is cooperative.        Thought Content: Thought content normal.        Judgment: Judgment normal.           Patient has been counseled extensively about nutrition and exercise as well as the importance of adherence with medications and regular follow-up. The patient was given clear instructions to go to ER or return to medical center if symptoms don't improve, worsen or new problems develop. The patient verbalized understanding.   Follow-up: Return in about 3 months (around 07/27/2023).   Claiborne Rigg, FNP-BC [facility] [place], [place] [phone]   04/29/2023, 10:39 PM

## 2023-04-29 ENCOUNTER — Encounter: Payer: Self-pay | Admitting: Nurse Practitioner

## 2023-04-29 LAB — CBC WITH DIFFERENTIAL/PLATELET
Basophils Absolute: 0.1 10*3/uL (ref 0.0–0.2)
Basos: 1 %
EOS (ABSOLUTE): 0.3 10*3/uL (ref 0.0–0.4)
Eos: 3 %
Hematocrit: 30.7 % — ABNORMAL LOW (ref 34.0–46.6)
Hemoglobin: 10.2 g/dL — ABNORMAL LOW (ref 11.1–15.9)
Immature Grans (Abs): 0 10*3/uL (ref 0.0–0.1)
Immature Granulocytes: 0 %
Lymphocytes Absolute: 4.5 10*3/uL — ABNORMAL HIGH (ref 0.7–3.1)
Lymphs: 39 %
MCH: 34.7 pg — ABNORMAL HIGH (ref 26.6–33.0)
MCHC: 33.2 g/dL (ref 31.5–35.7)
MCV: 104 fL — ABNORMAL HIGH (ref 79–97)
Monocytes Absolute: 1.2 10*3/uL — ABNORMAL HIGH (ref 0.1–0.9)
Monocytes: 11 %
Neutrophils Absolute: 5.3 10*3/uL (ref 1.4–7.0)
Neutrophils: 46 %
Platelets: 332 10*3/uL (ref 150–450)
RBC: 2.94 x10E6/uL — ABNORMAL LOW (ref 3.77–5.28)
RDW: 13.5 % (ref 11.7–15.4)
WBC: 11.5 10*3/uL — ABNORMAL HIGH (ref 3.4–10.8)

## 2023-04-29 LAB — CMP14+EGFR
ALT: 18 [IU]/L (ref 0–32)
AST: 13 [IU]/L (ref 0–40)
Albumin: 4.3 g/dL (ref 3.8–4.8)
Alkaline Phosphatase: 53 [IU]/L (ref 44–121)
BUN/Creatinine Ratio: 17 (ref 12–28)
BUN: 21 mg/dL (ref 8–27)
Bilirubin Total: 0.4 mg/dL (ref 0.0–1.2)
CO2: 21 mmol/L (ref 20–29)
Calcium: 9.6 mg/dL (ref 8.7–10.3)
Chloride: 101 mmol/L (ref 96–106)
Creatinine, Ser: 1.25 mg/dL — ABNORMAL HIGH (ref 0.57–1.00)
Globulin, Total: 3.6 g/dL (ref 1.5–4.5)
Glucose: 107 mg/dL — ABNORMAL HIGH (ref 70–99)
Potassium: 5.1 mmol/L (ref 3.5–5.2)
Sodium: 138 mmol/L (ref 134–144)
Total Protein: 7.9 g/dL (ref 6.0–8.5)
eGFR: 46 mL/min/{1.73_m2} — ABNORMAL LOW (ref >59)

## 2023-05-04 ENCOUNTER — Telehealth: Payer: Self-pay | Admitting: *Deleted

## 2023-05-04 NOTE — Telephone Encounter (Signed)
 Pt given lab results per notes of Z. Theotis, NP from 04/29/23 on 05/04/23. Pt verbalized understanding of message  She canceled her last appt with the blood specialist whom she should be following for her low  red blood cells. Needs to call them to schedule and should be taking iron twice per day.   [facility]  [address], [place], [place] [address] Phone: [phone]     Liver function and electrolytes are normal.   Kidney function slight decline but labs stable at this time. Work on losing weight. Low salt diet.  Patient is going to call Cancer center to schedule appt.

## 2023-05-08 ENCOUNTER — Other Ambulatory Visit: Payer: Self-pay

## 2023-05-09 ENCOUNTER — Encounter: Payer: Self-pay | Admitting: Oncology

## 2023-05-09 ENCOUNTER — Other Ambulatory Visit: Payer: Self-pay

## 2023-05-10 ENCOUNTER — Other Ambulatory Visit: Payer: Self-pay

## 2023-06-16 ENCOUNTER — Encounter: Payer: Self-pay | Admitting: Oncology

## 2023-06-26 ENCOUNTER — Other Ambulatory Visit: Payer: Self-pay

## 2023-06-26 ENCOUNTER — Encounter: Payer: Self-pay | Admitting: Oncology

## 2023-06-28 ENCOUNTER — Other Ambulatory Visit: Payer: Self-pay

## 2023-07-03 ENCOUNTER — Other Ambulatory Visit: Payer: Self-pay | Admitting: Nurse Practitioner

## 2023-07-03 DIAGNOSIS — F3341 Major depressive disorder, recurrent, in partial remission: Secondary | ICD-10-CM

## 2023-07-03 MED ORDER — SERTRALINE HCL 50 MG PO TABS
50.0000 mg | ORAL_TABLET | Freq: Every day | ORAL | 0 refills | Status: DC
  Filled 2023-07-03: qty 90, 90d supply, fill #0

## 2023-07-04 ENCOUNTER — Other Ambulatory Visit: Payer: Self-pay

## 2023-07-04 ENCOUNTER — Encounter: Payer: Self-pay | Admitting: Oncology

## 2023-07-06 ENCOUNTER — Other Ambulatory Visit: Payer: Self-pay

## 2023-07-07 ENCOUNTER — Other Ambulatory Visit: Payer: Self-pay

## 2023-07-07 ENCOUNTER — Encounter: Payer: Self-pay | Admitting: Oncology

## 2023-08-01 ENCOUNTER — Encounter: Payer: Self-pay | Admitting: Oncology

## 2023-08-01 ENCOUNTER — Other Ambulatory Visit: Payer: Self-pay | Admitting: Nurse Practitioner

## 2023-08-01 ENCOUNTER — Other Ambulatory Visit: Payer: Self-pay

## 2023-08-01 DIAGNOSIS — I10 Essential (primary) hypertension: Secondary | ICD-10-CM

## 2023-08-01 DIAGNOSIS — E1165 Type 2 diabetes mellitus with hyperglycemia: Secondary | ICD-10-CM

## 2023-08-01 DIAGNOSIS — R1013 Epigastric pain: Secondary | ICD-10-CM

## 2023-08-01 MED ORDER — METFORMIN HCL 500 MG PO TABS
500.0000 mg | ORAL_TABLET | Freq: Two times a day (BID) | ORAL | 0 refills | Status: DC
  Filled 2023-08-01: qty 60, 30d supply, fill #0

## 2023-08-01 MED ORDER — LOSARTAN POTASSIUM 50 MG PO TABS
50.0000 mg | ORAL_TABLET | Freq: Every day | ORAL | 0 refills | Status: DC
Start: 2023-08-01 — End: 2023-09-20
  Filled 2023-08-01: qty 30, 30d supply, fill #0

## 2023-08-01 MED ORDER — OMEPRAZOLE 20 MG PO CPDR
20.0000 mg | DELAYED_RELEASE_CAPSULE | Freq: Every day | ORAL | 0 refills | Status: DC
  Filled 2023-08-01: qty 30, 30d supply, fill #0

## 2023-08-01 MED ORDER — HYDROCHLOROTHIAZIDE 25 MG PO TABS
12.5000 mg | ORAL_TABLET | Freq: Every day | ORAL | 0 refills | Status: DC
  Filled 2023-08-01: qty 15, 30d supply, fill #0

## 2023-08-02 ENCOUNTER — Other Ambulatory Visit: Payer: Self-pay

## 2023-08-04 ENCOUNTER — Ambulatory Visit: Payer: Medicaid Other | Admitting: Nurse Practitioner

## 2023-08-31 ENCOUNTER — Other Ambulatory Visit: Payer: Self-pay

## 2023-08-31 ENCOUNTER — Other Ambulatory Visit: Payer: Self-pay | Admitting: Family Medicine

## 2023-08-31 DIAGNOSIS — I10 Essential (primary) hypertension: Secondary | ICD-10-CM

## 2023-08-31 DIAGNOSIS — E1165 Type 2 diabetes mellitus with hyperglycemia: Secondary | ICD-10-CM

## 2023-08-31 DIAGNOSIS — R1013 Epigastric pain: Secondary | ICD-10-CM

## 2023-09-01 ENCOUNTER — Other Ambulatory Visit: Payer: Self-pay | Admitting: Family Medicine

## 2023-09-01 DIAGNOSIS — R1013 Epigastric pain: Secondary | ICD-10-CM

## 2023-09-01 DIAGNOSIS — I10 Essential (primary) hypertension: Secondary | ICD-10-CM

## 2023-09-01 DIAGNOSIS — E1165 Type 2 diabetes mellitus with hyperglycemia: Secondary | ICD-10-CM

## 2023-09-04 ENCOUNTER — Other Ambulatory Visit: Payer: Self-pay

## 2023-09-05 ENCOUNTER — Other Ambulatory Visit (HOSPITAL_COMMUNITY): Payer: Self-pay

## 2023-09-05 ENCOUNTER — Encounter (HOSPITAL_COMMUNITY): Payer: Self-pay

## 2023-09-20 ENCOUNTER — Encounter: Payer: Self-pay | Admitting: Oncology

## 2023-09-20 ENCOUNTER — Ambulatory Visit: Attending: Nurse Practitioner | Admitting: Nurse Practitioner

## 2023-09-20 ENCOUNTER — Other Ambulatory Visit: Payer: Self-pay

## 2023-09-20 ENCOUNTER — Encounter: Payer: Self-pay | Admitting: Nurse Practitioner

## 2023-09-20 VITALS — BP 128/70 | HR 78 | Resp 20 | Ht 64.0 in | Wt 240.4 lb

## 2023-09-20 DIAGNOSIS — R1013 Epigastric pain: Secondary | ICD-10-CM | POA: Diagnosis not present

## 2023-09-20 DIAGNOSIS — Z1211 Encounter for screening for malignant neoplasm of colon: Secondary | ICD-10-CM

## 2023-09-20 DIAGNOSIS — E785 Hyperlipidemia, unspecified: Secondary | ICD-10-CM | POA: Diagnosis not present

## 2023-09-20 DIAGNOSIS — I10 Essential (primary) hypertension: Secondary | ICD-10-CM | POA: Diagnosis not present

## 2023-09-20 DIAGNOSIS — F3341 Major depressive disorder, recurrent, in partial remission: Secondary | ICD-10-CM

## 2023-09-20 DIAGNOSIS — D649 Anemia, unspecified: Secondary | ICD-10-CM

## 2023-09-20 DIAGNOSIS — Z23 Encounter for immunization: Secondary | ICD-10-CM | POA: Diagnosis not present

## 2023-09-20 DIAGNOSIS — M170 Bilateral primary osteoarthritis of knee: Secondary | ICD-10-CM

## 2023-09-20 DIAGNOSIS — E119 Type 2 diabetes mellitus without complications: Secondary | ICD-10-CM | POA: Diagnosis not present

## 2023-09-20 DIAGNOSIS — Z7984 Long term (current) use of oral hypoglycemic drugs: Secondary | ICD-10-CM

## 2023-09-20 MED ORDER — METFORMIN HCL 500 MG PO TABS
500.0000 mg | ORAL_TABLET | Freq: Two times a day (BID) | ORAL | 1 refills | Status: DC
  Filled 2023-09-20: qty 180, 90d supply, fill #0
  Filled 2023-12-19: qty 180, 90d supply, fill #1

## 2023-09-20 MED ORDER — TRUE METRIX BLOOD GLUCOSE TEST VI STRP
ORAL_STRIP | 2 refills | Status: DC
  Filled 2023-09-20: qty 100, 50d supply, fill #0
  Filled 2023-11-07: qty 100, 50d supply, fill #1

## 2023-09-20 MED ORDER — SERTRALINE HCL 50 MG PO TABS
50.0000 mg | ORAL_TABLET | Freq: Every day | ORAL | 1 refills | Status: DC
  Filled 2023-09-20 – 2023-09-21 (×2): qty 90, 90d supply, fill #0
  Filled 2024-01-02 – 2024-01-03 (×2): qty 90, 90d supply, fill #1

## 2023-09-20 MED ORDER — OMEPRAZOLE 20 MG PO CPDR
20.0000 mg | DELAYED_RELEASE_CAPSULE | Freq: Every day | ORAL | 1 refills | Status: DC
  Filled 2023-09-20: qty 90, 90d supply, fill #0
  Filled 2023-12-19: qty 90, 90d supply, fill #1

## 2023-09-20 MED ORDER — PRAVASTATIN SODIUM 40 MG PO TABS
40.0000 mg | ORAL_TABLET | Freq: Every day | ORAL | 2 refills | Status: AC
  Filled 2023-09-20 – 2023-10-03 (×2): qty 90, 90d supply, fill #0
  Filled 2024-01-02 – 2024-01-03 (×2): qty 90, 90d supply, fill #1
  Filled 2024-03-31: qty 90, 90d supply, fill #2

## 2023-09-20 MED ORDER — LOSARTAN POTASSIUM 50 MG PO TABS
50.0000 mg | ORAL_TABLET | Freq: Every day | ORAL | 1 refills | Status: DC
  Filled 2023-09-20 – 2023-09-21 (×2): qty 90, 90d supply, fill #0
  Filled 2023-12-19: qty 90, 90d supply, fill #1

## 2023-09-20 MED ORDER — TRUEPLUS LANCETS 28G MISC
3 refills | Status: DC
  Filled 2023-09-20: qty 100, 50d supply, fill #0
  Filled 2023-11-07: qty 100, 50d supply, fill #1

## 2023-09-20 MED ORDER — DICLOFENAC SODIUM 1 % EX GEL
4.0000 g | Freq: Four times a day (QID) | CUTANEOUS | 1 refills | Status: AC
  Filled 2023-09-20: qty 200, 12d supply, fill #0
  Filled 2023-09-29: qty 200, 12d supply, fill #1

## 2023-09-20 MED ORDER — TRAMADOL HCL 50 MG PO TABS
50.0000 mg | ORAL_TABLET | Freq: Every day | ORAL | 0 refills | Status: DC | PRN
  Filled 2023-09-20: qty 30, 30d supply, fill #0

## 2023-09-20 MED ORDER — HYDROCHLOROTHIAZIDE 25 MG PO TABS
12.5000 mg | ORAL_TABLET | Freq: Every day | ORAL | 1 refills | Status: DC
  Filled 2023-09-20: qty 45, 90d supply, fill #0
  Filled 2023-12-19: qty 45, 90d supply, fill #1

## 2023-09-20 NOTE — Progress Notes (Signed)
 Assessment & Plan:   [name] was seen today for hypertension.  Diagnoses and all orders for this visit:  Essential hypertension -     CMP14+EGFR -     hydrochlorothiazide  (HYDRODIURIL ) 25 MG tablet; Take 0.5 tablets (12.5 mg total) by mouth daily. For blood pressure. -     losartan  (COZAAR ) 50 MG tablet; Take 1 tablet (50 mg total) by mouth daily. For blood pressure. Continue all antihypertensives as prescribed.  Reminded to bring in blood pressure log for follow  up appointment.  RECOMMENDATIONS: DASH/Mediterranean Diets are healthier choices for HTN.    Diabetes mellitus treated with oral medication (HCC) -     CMP14+EGFR -     Hemoglobin A1c -     Urine Albumin/Creatinine with ratio (send out) [LAB689] -     glucose blood (TRUE METRIX BLOOD GLUCOSE TEST) test strip; Use as instructed. Check blood glucose level by fingerstick twice per day. -     metFORMIN  (GLUCOPHAGE ) 500 MG tablet; Take 1 tablet (500 mg total) by mouth 2 (two) times daily with a meal. For diabetes. -     TRUEplus Lancets 28G MISC; Use as instructed. Check blood glucose level by fingerstick twice per day. E11.65 Continue blood sugar control as discussed in office today, low carbohydrate diet, and regular physical exercise as tolerated, 150 minutes per week (30 min each day, 5 days per week, or 50 min 3 days per week). Keep blood sugar logs with fasting goal of 90-130 mg/dl, post prandial (after you eat) less than 180.  For Hypoglycemia: BS <60 and Hyperglycemia BS >400; contact the clinic ASAP. Annual eye exams and foot exams are recommended.   Dyspepsia -     omeprazole  (PRILOSEC) 20 MG capsule; Take 1 capsule (20 mg total) by mouth daily. For heartburn or acid reflux. INSTRUCTIONS: Avoid GERD Triggers: acidic, spicy or fried foods, caffeine, coffee, sodas,  alcohol and chocolate.    Recurrent major depressive disorder, in partial remission (HCC) -     sertraline  (ZOLOFT ) 50 MG tablet; Take 1 tablet (50 mg total)  by mouth daily. For depression.  Need for shingles vaccine -     Varicella-zoster vaccine IM  Primary osteoarthritis of both knees -     diclofenac  Sodium (VOLTAREN  ARTHRITIS PAIN) 1 % GEL; Apply 4 grams topically 4 (four) times daily. -     traMADol  (ULTRAM ) 50 MG tablet; Take 1 tablet (50 mg total) by mouth daily as needed for severe pain (pain score 7-10). Work on losing weight to help reduce joint pain. May alternate with heat and ice application for pain relief. May also alternate with acetaminophen  and Ibuprofen as prescribed pain relief. Other alternatives include massage, acupuncture and water aerobics.  You must stay active and avoid a sedentary lifestyle.    Dyslipidemia, goal LDL below 70 -     pravastatin  (PRAVACHOL ) 40 MG tablet; Take 1 tablet (40 mg total) by mouth daily. For cholesterol. Please fill as a 90 day supply INSTRUCTIONS: Work on a low fat, heart healthy diet and participate in regular aerobic exercise program by working out at least 150 minutes per week; 5 days a week-30 minutes per day. Avoid red meat/beef/steak,  fried foods. junk foods, sodas, sugary drinks, unhealthy snacking, alcohol and smoking.  Drink at least 80 oz of water per day and monitor your carbohydrate intake daily.    Anemia, unspecified type -     CBC with Differential/Platelet  Colon cancer screening -  Fecal occult blood, imunochemical([facility])    Patient has been counseled on age-appropriate routine health concerns for screening and prevention. These are reviewed and up-to-date. Referrals have been placed accordingly. Immunizations are up-to-date or declined.    Subjective:   Chief Complaint  Patient presents with   Hypertension    Christina Clark 73 y.o. female presents to office today for follow up to HTN She is accompanied by her daughter who is translating for her She has been staying in [place] over the past several months due to her sister being diagnosed with  breast cancer.   She has a past medical history of Chronic kidney disease, Depression, DM 2,, Hyperlipidemia, and Hypertension.    HTN Blood pressure is well controlled. She is taking losartan  50 mg daily and hydrochlorothiazide  12.5 mg daily.  BP Readings from Last 3 Encounters:  09/20/23 128/70  04/28/23 119/69  04/06/23 (!) 164/52    DM 2 A1c at goal. She is tolerating metformin  well.  Lab Results  Component Value Date   HGBA1C 6.4 01/20/2023  LIPID LDL not at goal with pravastatin  40 mg daily.  Lab Results  Component Value Date   LDLCALC 125 (H) 01/20/2023     She is tolerating PPI and GERD symptoms resolved. Taking zoloft  for depression and reports mood stability. She has chronic B/L Knee OA. BMI 41.26. Not a good candidate at this time for surgery due to BMI and potential surgical risks. She endorses shortness of breath with walking. She has central obesity which is likely contributing to her breathing. She does continue to have heart murmur. She is up to date on cardiology follow up.    Review of Systems  Constitutional:  Negative for fever, malaise/fatigue and weight loss.  HENT: Negative.  Negative for nosebleeds.   Eyes: Negative.  Negative for blurred vision, double vision and photophobia.  Respiratory: Negative.  Negative for cough and shortness of breath.   Cardiovascular: Negative.  Negative for chest pain, palpitations and leg swelling.  Gastrointestinal: Negative.  Negative for heartburn, nausea and vomiting.  Musculoskeletal:  Positive for joint pain. Negative for myalgias.  Neurological: Negative.  Negative for dizziness, focal weakness, seizures and headaches.  Psychiatric/Behavioral: Negative.  Negative for suicidal ideas.     Past Medical History:  Diagnosis Date   Chronic kidney disease    Depression    After husbands death, takes medication   Diabetes mellitus without complication (HCC)    Hyperlipidemia    Hypertension     Past Surgical  History:  Procedure Laterality Date   cataract surgery      Family History  Problem Relation Age of Onset   Colon cancer Mother    Breast cancer Sister 48   Heart attack Brother    Diabetes Neg Hx    Hypertension Neg Hx     Social History Reviewed with no changes to be made today.   Outpatient Medications Prior to Visit  Medication Sig Dispense Refill   ferrous sulfate  325 (65 FE) MG EC tablet Take 1 tablet (325 mg total) by mouth 2 (two) times daily with a meal. 60 tablet 2   folic acid  (V-R FOLIC ACID ) 400 MCG tablet Take 1 tablet (400 mcg total) by mouth daily. 90 tablet 2   glucose blood (TRUE METRIX BLOOD GLUCOSE TEST) test strip Use as instructed. Check blood glucose level by fingerstick twice per day. 100 each 2   hydrochlorothiazide  (HYDRODIURIL ) 25 MG tablet Take 0.5 tablets (12.5 mg total) by  mouth daily. For blood pressure. 15 tablet 0   losartan  (COZAAR ) 50 MG tablet Take 1 tablet (50 mg total) by mouth daily. For blood pressure. 30 tablet 0   metFORMIN  (GLUCOPHAGE ) 500 MG tablet Take 1 tablet (500 mg total) by mouth 2 (two) times daily with a meal. 60 tablet 0   omeprazole  (PRILOSEC) 20 MG capsule Take 1 capsule (20 mg total) by mouth daily. FOR HEARTBURN. 30 capsule 0   pravastatin  (PRAVACHOL ) 40 MG tablet Take 1 tablet (40 mg total) by mouth daily. For cholesterol. Please fill as a 90 day supply 90 tablet 2   sertraline  (ZOLOFT ) 50 MG tablet Take 1 tablet (50 mg total) by mouth daily. For depression. 90 tablet 0   TRUEplus Lancets 28G MISC Use as instructed. Check blood glucose level by fingerstick twice per day. E11.65 100 each 3   traMADol  (ULTRAM ) 50 MG tablet Take 1 tablet (50 mg total) by mouth daily as needed. (Patient not taking: Reported on 09/20/2023) 30 tablet 0   No facility-administered medications prior to visit.    Allergies  Allergen Reactions   Chocolate     Reports vertigo sx   Coffee Bean Extract [Coffea Arabica]        Objective:    BP  128/70 (BP Location: Right Arm, Patient Position: Sitting, Cuff Size: Large)   Pulse 78   Resp 20   Ht 5\' 4"  (1.626 m)   Wt 240 lb 6.4 oz (109 kg)   SpO2 100%   BMI 41.26 kg/m  Wt Readings from Last 3 Encounters:  09/20/23 240 lb 6.4 oz (109 kg)  04/28/23 231 lb 3.2 oz (104.9 kg)  04/06/23 231 lb (104.8 kg)    Physical Exam Vitals and nursing note reviewed.  Constitutional:      Appearance: She is well-developed. She is obese.  HENT:     Head: Normocephalic and atraumatic.  Cardiovascular:     Rate and Rhythm: Normal rate and regular rhythm.     Heart sounds: Murmur heard.     No friction rub. No gallop.  Pulmonary:     Effort: Pulmonary effort is normal. No tachypnea or respiratory distress.     Breath sounds: Normal breath sounds. No decreased breath sounds, wheezing, rhonchi or rales.  Chest:     Chest wall: No tenderness.  Abdominal:     General: Bowel sounds are normal.     Palpations: Abdomen is soft.  Musculoskeletal:        General: Normal range of motion.     Cervical back: Normal range of motion.  Skin:    General: Skin is warm and dry.  Neurological:     Mental Status: She is alert and oriented to person, place, and time.     Coordination: Coordination normal.  Psychiatric:        Behavior: Behavior normal. Behavior is cooperative.        Thought Content: Thought content normal.        Judgment: Judgment normal.          Patient has been counseled extensively about nutrition and exercise as well as the importance of adherence with medications and regular follow-up. The patient was given clear instructions to go to ER or return to medical center if symptoms don't improve, worsen or new problems develop. The patient verbalized understanding.   Follow-up: Return in about 3 months (around 12/26/2023).   Collins Dean, FNP-BC [facility] [place], [place] [phone]  09/20/2023, 6:10 PM

## 2023-09-20 NOTE — Patient Instructions (Signed)
 FOR INSOMNIA  MELATONIN SLEEPY TIME TEA ASHWAGHANDA

## 2023-09-21 ENCOUNTER — Encounter: Payer: Self-pay | Admitting: Oncology

## 2023-09-21 ENCOUNTER — Ambulatory Visit: Payer: Self-pay | Admitting: Nurse Practitioner

## 2023-09-21 ENCOUNTER — Other Ambulatory Visit: Payer: Self-pay

## 2023-09-21 LAB — CBC WITH DIFFERENTIAL/PLATELET
Basophils Absolute: 0.1 10*3/uL (ref 0.0–0.2)
Basos: 1 %
EOS (ABSOLUTE): 0.5 10*3/uL — ABNORMAL HIGH (ref 0.0–0.4)
Eos: 4 %
Hematocrit: 30.5 % — ABNORMAL LOW (ref 34.0–46.6)
Hemoglobin: 10.2 g/dL — ABNORMAL LOW (ref 11.1–15.9)
Immature Grans (Abs): 0 10*3/uL (ref 0.0–0.1)
Immature Granulocytes: 0 %
Lymphocytes Absolute: 5.3 10*3/uL — ABNORMAL HIGH (ref 0.7–3.1)
Lymphs: 44 %
MCH: 34.7 pg — ABNORMAL HIGH (ref 26.6–33.0)
MCHC: 33.4 g/dL (ref 31.5–35.7)
MCV: 104 fL — ABNORMAL HIGH (ref 79–97)
Monocytes Absolute: 1.4 10*3/uL — ABNORMAL HIGH (ref 0.1–0.9)
Monocytes: 12 %
Neutrophils Absolute: 4.7 10*3/uL (ref 1.4–7.0)
Neutrophils: 39 %
Platelets: 327 10*3/uL (ref 150–450)
RBC: 2.94 x10E6/uL — ABNORMAL LOW (ref 3.77–5.28)
RDW: 14.3 % (ref 11.7–15.4)
WBC: 12 10*3/uL — ABNORMAL HIGH (ref 3.4–10.8)

## 2023-09-21 LAB — CMP14+EGFR
ALT: 27 IU/L (ref 0–32)
AST: 16 IU/L (ref 0–40)
Albumin: 4.1 g/dL (ref 3.8–4.8)
Alkaline Phosphatase: 52 IU/L (ref 44–121)
BUN/Creatinine Ratio: 20 (ref 12–28)
BUN: 24 mg/dL (ref 8–27)
Bilirubin Total: 0.3 mg/dL (ref 0.0–1.2)
CO2: 23 mmol/L (ref 20–29)
Calcium: 9.1 mg/dL (ref 8.7–10.3)
Chloride: 103 mmol/L (ref 96–106)
Creatinine, Ser: 1.21 mg/dL — ABNORMAL HIGH (ref 0.57–1.00)
Globulin, Total: 3.5 g/dL (ref 1.5–4.5)
Glucose: 109 mg/dL — ABNORMAL HIGH (ref 70–99)
Potassium: 4.8 mmol/L (ref 3.5–5.2)
Sodium: 140 mmol/L (ref 134–144)
Total Protein: 7.6 g/dL (ref 6.0–8.5)
eGFR: 48 mL/min/{1.73_m2} — ABNORMAL LOW (ref >59)

## 2023-09-21 LAB — HEMOGLOBIN A1C
Est. average glucose Bld gHb Est-mCnc: 189 mg/dL
Hgb A1c MFr Bld: 8.2 % — ABNORMAL HIGH (ref 4.8–5.6)

## 2023-09-21 LAB — MICROALBUMIN / CREATININE URINE RATIO
Creatinine, Urine: 108.8 mg/dL
Microalb/Creat Ratio: <3 mg/g{creat} (ref 0–29)
Microalbumin, Urine: <3 ug/mL

## 2023-09-22 LAB — FECAL OCCULT BLOOD, IMMUNOCHEMICAL: Fecal Occult Bld: NEGATIVE

## 2023-09-29 ENCOUNTER — Other Ambulatory Visit: Payer: Self-pay

## 2023-09-29 ENCOUNTER — Encounter: Payer: Self-pay | Admitting: Oncology

## 2023-10-04 ENCOUNTER — Encounter: Payer: Self-pay | Admitting: Oncology

## 2023-10-04 ENCOUNTER — Other Ambulatory Visit: Payer: Self-pay

## 2023-11-07 ENCOUNTER — Other Ambulatory Visit: Payer: Self-pay

## 2023-11-08 ENCOUNTER — Other Ambulatory Visit: Payer: Self-pay

## 2023-11-08 ENCOUNTER — Other Ambulatory Visit: Payer: Self-pay | Admitting: Pharmacist

## 2023-11-08 ENCOUNTER — Encounter: Payer: Self-pay | Admitting: Oncology

## 2023-11-08 MED ORDER — ACCU-CHEK SOFTCLIX LANCETS MISC
6 refills | Status: AC
  Filled 2023-11-08: qty 100, 50d supply, fill #0
  Filled 2023-12-25: qty 100, 50d supply, fill #1
  Filled 2024-02-12: qty 100, 50d supply, fill #2
  Filled 2024-04-02: qty 100, 50d supply, fill #3
  Filled 2024-05-20: qty 100, 50d supply, fill #4

## 2023-11-08 MED ORDER — ACCU-CHEK GUIDE W/DEVICE KIT
PACK | 0 refills | Status: AC
  Filled 2023-11-08: qty 1, 30d supply, fill #0

## 2023-11-08 MED ORDER — ACCU-CHEK GUIDE TEST VI STRP
ORAL_STRIP | 6 refills | Status: AC
  Filled 2023-11-08: qty 100, 50d supply, fill #0
  Filled 2023-12-25: qty 100, 50d supply, fill #1
  Filled 2024-02-13: qty 100, 50d supply, fill #2
  Filled 2024-04-06: qty 100, 50d supply, fill #3
  Filled 2024-06-07: qty 100, 50d supply, fill #4

## 2023-11-09 ENCOUNTER — Other Ambulatory Visit: Payer: Self-pay

## 2023-12-20 ENCOUNTER — Other Ambulatory Visit: Payer: Self-pay

## 2023-12-22 ENCOUNTER — Ambulatory Visit: Admitting: Nurse Practitioner

## 2023-12-25 ENCOUNTER — Other Ambulatory Visit: Payer: Self-pay

## 2023-12-27 ENCOUNTER — Other Ambulatory Visit: Payer: Self-pay

## 2024-01-03 ENCOUNTER — Other Ambulatory Visit: Payer: Self-pay

## 2024-02-13 ENCOUNTER — Other Ambulatory Visit: Payer: Self-pay

## 2024-02-21 ENCOUNTER — Encounter: Payer: Self-pay | Admitting: Nurse Practitioner

## 2024-02-21 ENCOUNTER — Other Ambulatory Visit: Payer: Self-pay

## 2024-02-21 ENCOUNTER — Ambulatory Visit: Attending: Nurse Practitioner | Admitting: Nurse Practitioner

## 2024-02-21 ENCOUNTER — Encounter: Payer: Self-pay | Admitting: Oncology

## 2024-02-21 VITALS — BP 144/72 | HR 73 | Resp 19 | Ht 64.0 in | Wt 239.8 lb

## 2024-02-21 DIAGNOSIS — I10 Essential (primary) hypertension: Secondary | ICD-10-CM

## 2024-02-21 DIAGNOSIS — Z7984 Long term (current) use of oral hypoglycemic drugs: Secondary | ICD-10-CM | POA: Diagnosis not present

## 2024-02-21 DIAGNOSIS — M170 Bilateral primary osteoarthritis of knee: Secondary | ICD-10-CM

## 2024-02-21 DIAGNOSIS — L989 Disorder of the skin and subcutaneous tissue, unspecified: Secondary | ICD-10-CM

## 2024-02-21 DIAGNOSIS — E119 Type 2 diabetes mellitus without complications: Secondary | ICD-10-CM

## 2024-02-21 DIAGNOSIS — E1122 Type 2 diabetes mellitus with diabetic chronic kidney disease: Secondary | ICD-10-CM

## 2024-02-21 DIAGNOSIS — F3341 Major depressive disorder, recurrent, in partial remission: Secondary | ICD-10-CM

## 2024-02-21 DIAGNOSIS — Z23 Encounter for immunization: Secondary | ICD-10-CM | POA: Diagnosis not present

## 2024-02-21 DIAGNOSIS — D631 Anemia in chronic kidney disease: Secondary | ICD-10-CM

## 2024-02-21 DIAGNOSIS — N1831 Chronic kidney disease, stage 3a: Secondary | ICD-10-CM

## 2024-02-21 DIAGNOSIS — R1013 Epigastric pain: Secondary | ICD-10-CM

## 2024-02-21 DIAGNOSIS — I129 Hypertensive chronic kidney disease with stage 1 through stage 4 chronic kidney disease, or unspecified chronic kidney disease: Secondary | ICD-10-CM

## 2024-02-21 LAB — POCT GLYCOSYLATED HEMOGLOBIN (HGB A1C): HbA1c, POC (controlled diabetic range): 6.7 % (ref 0.0–7.0)

## 2024-02-21 MED ORDER — HYDROCHLOROTHIAZIDE 25 MG PO TABS
12.5000 mg | ORAL_TABLET | Freq: Every day | ORAL | 1 refills | Status: AC
  Filled 2024-02-21 – 2024-03-14 (×2): qty 45, 90d supply, fill #0

## 2024-02-21 MED ORDER — TRAMADOL HCL 50 MG PO TABS
50.0000 mg | ORAL_TABLET | Freq: Every day | ORAL | 0 refills | Status: AC | PRN
  Filled 2024-02-21: qty 30, 30d supply, fill #0

## 2024-02-21 MED ORDER — LOSARTAN POTASSIUM 50 MG PO TABS
50.0000 mg | ORAL_TABLET | Freq: Every day | ORAL | 1 refills | Status: AC
  Filled 2024-02-21 – 2024-03-14 (×2): qty 90, 90d supply, fill #0

## 2024-02-21 MED ORDER — SERTRALINE HCL 50 MG PO TABS
50.0000 mg | ORAL_TABLET | Freq: Every day | ORAL | 1 refills | Status: AC
  Filled 2024-02-21 – 2024-03-31 (×2): qty 90, 90d supply, fill #0

## 2024-02-21 MED ORDER — METFORMIN HCL 500 MG PO TABS
500.0000 mg | ORAL_TABLET | Freq: Two times a day (BID) | ORAL | 1 refills | Status: AC
  Filled 2024-02-21 – 2024-03-14 (×2): qty 180, 90d supply, fill #0

## 2024-02-21 MED ORDER — OMEPRAZOLE 20 MG PO CPDR
20.0000 mg | DELAYED_RELEASE_CAPSULE | Freq: Every day | ORAL | 1 refills | Status: AC
  Filled 2024-02-21 – 2024-03-14 (×2): qty 90, 90d supply, fill #0

## 2024-02-21 NOTE — Progress Notes (Signed)
 Assessment & Plan:  Christina Clark was seen today for diabetes.  Diagnoses and all orders for this visit:  Diabetes mellitus treated with oral medication (HCC) -     POCT glycosylated hemoglobin (Hb A1C) -     metFORMIN  (GLUCOPHAGE ) 500 MG tablet; Take 1 tablet (500 mg total) by mouth 2 (two) times daily with a meal. For diabetes. -     CMP14+EGFR -     Ambulatory referral to Ophthalmology Continue blood sugar control as discussed in office today, low carbohydrate diet, and regular physical exercise as tolerated, 150 minutes per week (30 min each day, 5 days per week, or 50 min 3 days per week). Keep blood sugar logs with fasting goal of 90-130 mg/dl, post prandial (after you eat) less than 180.  For Hypoglycemia: BS <60 and Hyperglycemia BS >400; contact the clinic ASAP. Annual eye exams and foot exams are recommended.   Primary hypertension -     hydrochlorothiazide  (HYDRODIURIL ) 25 MG tablet; Take 0.5 tablets (12.5 mg total) by mouth daily. For blood pressure. -     losartan  (COZAAR ) 50 MG tablet; Take 1 tablet (50 mg total) by mouth daily. For blood pressure. -     CMP14+EGFR Continue all antihypertensives as prescribed.  Reminded to bring in blood pressure log for follow  up appointment.  RECOMMENDATIONS: DASH/Mediterranean Diets are healthier choices for HTN.    Need for influenza vaccination -     Varicella-zoster vaccine IM -     Flu vaccine trivalent PF, 6mos and older(Flulaval,Afluria,Fluarix,Fluzone)  Primary osteoarthritis of both knees Pain is controlled with tramadol  use sparingly -     traMADol  (ULTRAM ) 50 MG tablet; Take 1 tablet (50 mg total) by mouth daily as needed for severe pain (pain score 7-10).  Lesion of skin of nose -     Ambulatory referral to Dermatology  Anemia in stage 3a chronic kidney disease (HCC) -     CBC with Differential/Platelet  Dyspepsia -     omeprazole  (PRILOSEC) 20 MG capsule; Take 1 capsule (20 mg total) by mouth daily. For heartburn or acid  reflux. INSTRUCTIONS: Avoid GERD Triggers: acidic, spicy or fried foods, caffeine, coffee, sodas,  alcohol and chocolate.     Recurrent major depressive disorder, in partial remission -     sertraline  (ZOLOFT ) 50 MG tablet; Take 1 tablet (50 mg total) by mouth daily. For depression.  Need for shingles vaccine -     Varicella-zoster vaccine IM    Patient has been counseled on age-appropriate routine health concerns for screening and prevention. These are reviewed and up-to-date. Referrals have been placed accordingly. Immunizations are up-to-date or declined.    Subjective:   Chief Complaint  Patient presents with   Diabetes    Christina Clark 73 y.o. female presents to office today for follow-up to hypertension and diabetes She is accompanied by her daughter who is translating for her today.  She has a past medical history of Chronic kidney disease, Depression, DM 2,, Hyperlipidemia, and Hypertension.   HTN Blood pressure is not quite at goal today.  She is taking losartan  50 mg daily and hydrochlorothiazide  12.5 mg daily as prescribed.  BP Readings from Last 3 Encounters:  02/21/24 (!) 144/72  09/20/23 128/70  04/28/23 119/69     DM2 A1c currently at goal and diabetes is well-managed with metformin  500 mg twice daily. Lab Results  Component Value Date   HGBA1C 6.7 02/21/2024     Lab Results  Component Value  Date   HGBA1C 8.2 (H) 09/20/2023     She has a small papule on the left nasal bridge that she would like excised.  Per her report the papule seems to be increasing in size. Review of Systems  Constitutional:  Negative for fever, malaise/fatigue and weight loss.  HENT: Negative.  Negative for nosebleeds.   Eyes: Negative.  Negative for blurred vision, double vision and photophobia.  Respiratory: Negative.  Negative for cough and shortness of breath.   Cardiovascular: Negative.  Negative for chest pain, palpitations and leg swelling.  Gastrointestinal:  Negative.  Negative for heartburn, nausea and vomiting.  Musculoskeletal:  Positive for joint pain. Negative for myalgias.  Skin:        SEE HPI  Neurological: Negative.  Negative for dizziness, focal weakness, seizures and headaches.  Psychiatric/Behavioral: Negative.  Negative for suicidal ideas.     Past Medical History:  Diagnosis Date   Chronic kidney disease    Depression    After husbands death, takes medication   Diabetes mellitus without complication (HCC)    Hyperlipidemia    Hypertension     Past Surgical History:  Procedure Laterality Date   cataract surgery      Family History  Problem Relation Age of Onset   Colon cancer Mother    Breast cancer Sister 59   Heart attack Brother    Diabetes Neg Hx    Hypertension Neg Hx     Social History Reviewed with no changes to be made today.   Outpatient Medications Prior to Visit  Medication Sig Dispense Refill   Accu-Chek Softclix Lancets lancets Use to check blood sugar 2 times daily. 100 each 6   Blood Glucose Monitoring Suppl (ACCU-CHEK GUIDE) w/Device KIT Use to check blood sugar 2 times daily. 1 kit 0   diclofenac  Sodium (VOLTAREN  ARTHRITIS PAIN) 1 % GEL Apply 4 grams topically 4 (four) times daily. 200 g 1   ferrous sulfate  325 (65 FE) MG EC tablet Take 1 tablet (325 mg total) by mouth 2 (two) times daily with a meal. 60 tablet 2   folic acid  (V-R FOLIC ACID ) 400 MCG tablet Take 1 tablet (400 mcg total) by mouth daily. 90 tablet 2   glucose blood (ACCU-CHEK GUIDE TEST) test strip Use to check blood sugar 2 times daily. 100 each 6   pravastatin  (PRAVACHOL ) 40 MG tablet Take 1 tablet (40 mg total) by mouth daily. For cholesterol. Please fill as a 90 day supply 90 tablet 2   hydrochlorothiazide  (HYDRODIURIL ) 25 MG tablet Take 0.5 tablets (12.5 mg total) by mouth daily. For blood pressure. 45 tablet 1   losartan  (COZAAR ) 50 MG tablet Take 1 tablet (50 mg total) by mouth daily. For blood pressure. 90 tablet 1    metFORMIN  (GLUCOPHAGE ) 500 MG tablet Take 1 tablet (500 mg total) by mouth 2 (two) times daily with a meal. For diabetes. 180 tablet 1   omeprazole  (PRILOSEC) 20 MG capsule Take 1 capsule (20 mg total) by mouth daily. For heartburn or acid reflux. 90 capsule 1   sertraline  (ZOLOFT ) 50 MG tablet Take 1 tablet (50 mg total) by mouth daily. For depression. 90 tablet 1   traMADol  (ULTRAM ) 50 MG tablet Take 1 tablet (50 mg total) by mouth daily as needed for severe pain (pain score 7-10). (Patient not taking: Reported on 02/21/2024) 30 tablet 0   No facility-administered medications prior to visit.    Allergies  Allergen Reactions   Chocolate  Reports vertigo sx   Coffee Bean Extract [Coffea Arabica]        Objective:    BP (!) 144/72 (BP Location: Left Arm, Patient Position: Sitting, Cuff Size: Large)   Pulse 73   Resp 19   Ht 5' 4 (1.626 m)   Wt 239 lb 12.8 oz (108.8 kg)   SpO2 90%   BMI 41.16 kg/m  Wt Readings from Last 3 Encounters:  02/21/24 239 lb 12.8 oz (108.8 kg)  09/20/23 240 lb 6.4 oz (109 kg)  04/28/23 231 lb 3.2 oz (104.9 kg)    Physical Exam Vitals and nursing note reviewed.  Constitutional:      Appearance: She is well-developed.  HENT:     Head: Normocephalic and atraumatic.     Nose:   Cardiovascular:     Rate and Rhythm: Normal rate and regular rhythm.     Heart sounds: Normal heart sounds. No murmur heard.    No friction rub. No gallop.  Pulmonary:     Effort: Pulmonary effort is normal. No tachypnea or respiratory distress.     Breath sounds: Normal breath sounds. No decreased breath sounds, wheezing, rhonchi or rales.  Chest:     Chest wall: No tenderness.  Musculoskeletal:        General: Normal range of motion.     Cervical back: Normal range of motion.  Skin:    General: Skin is warm and dry.  Neurological:     Mental Status: She is alert and oriented to person, place, and time.     Coordination: Coordination normal.  Psychiatric:         Behavior: Behavior normal. Behavior is cooperative.        Thought Content: Thought content normal.        Judgment: Judgment normal.          Patient has been counseled extensively about nutrition and exercise as well as the importance of adherence with medications and regular follow-up. The patient was given clear instructions to go to ER or return to medical center if symptoms don't improve, worsen or new problems develop. The patient verbalized understanding.   Follow-up: Return in about 4 months (around 06/07/2024).   Haze LELON Servant, FNP-BC [facility] [place], [place] [phone]   02/21/2024, 9:40 AM

## 2024-02-22 ENCOUNTER — Encounter: Payer: Self-pay | Admitting: Oncology

## 2024-02-22 ENCOUNTER — Ambulatory Visit: Payer: Self-pay | Admitting: Nurse Practitioner

## 2024-02-22 ENCOUNTER — Other Ambulatory Visit: Payer: Self-pay

## 2024-02-22 DIAGNOSIS — D649 Anemia, unspecified: Secondary | ICD-10-CM

## 2024-02-22 LAB — CBC WITH DIFFERENTIAL/PLATELET
Basophils Absolute: 0.1 x10E3/uL (ref 0.0–0.2)
Basos: 1 %
EOS (ABSOLUTE): 0.2 x10E3/uL (ref 0.0–0.4)
Eos: 3 %
Hematocrit: 30.4 % — ABNORMAL LOW (ref 34.0–46.6)
Hemoglobin: 10.1 g/dL — ABNORMAL LOW (ref 11.1–15.9)
Immature Grans (Abs): 0 x10E3/uL (ref 0.0–0.1)
Immature Granulocytes: 0 %
Lymphocytes Absolute: 4.5 x10E3/uL — ABNORMAL HIGH (ref 0.7–3.1)
Lymphs: 57 %
MCH: 35.4 pg — ABNORMAL HIGH (ref 26.6–33.0)
MCHC: 33.2 g/dL (ref 31.5–35.7)
MCV: 107 fL — ABNORMAL HIGH (ref 79–97)
Monocytes Absolute: 0.7 x10E3/uL (ref 0.1–0.9)
Monocytes: 9 %
Neutrophils Absolute: 2.4 x10E3/uL (ref 1.4–7.0)
Neutrophils: 30 %
Platelets: 294 x10E3/uL (ref 150–450)
RBC: 2.85 x10E6/uL — ABNORMAL LOW (ref 3.77–5.28)
RDW: 14.5 % (ref 11.7–15.4)
WBC: 8 x10E3/uL (ref 3.4–10.8)

## 2024-02-22 LAB — CMP14+EGFR
ALT: 21 IU/L (ref 0–32)
AST: 12 IU/L (ref 0–40)
Albumin: 4 g/dL (ref 3.8–4.8)
Alkaline Phosphatase: 48 IU/L — ABNORMAL LOW (ref 49–135)
BUN/Creatinine Ratio: 20 (ref 12–28)
BUN: 22 mg/dL (ref 8–27)
Bilirubin Total: 0.4 mg/dL (ref 0.0–1.2)
CO2: 23 mmol/L (ref 20–29)
Calcium: 9.3 mg/dL (ref 8.7–10.3)
Chloride: 105 mmol/L (ref 96–106)
Creatinine, Ser: 1.08 mg/dL — ABNORMAL HIGH (ref 0.57–1.00)
Globulin, Total: 3.1 g/dL (ref 1.5–4.5)
Glucose: 112 mg/dL — ABNORMAL HIGH (ref 70–99)
Potassium: 4.8 mmol/L (ref 3.5–5.2)
Sodium: 140 mmol/L (ref 134–144)
Total Protein: 7.1 g/dL (ref 6.0–8.5)
eGFR: 55 mL/min/1.73 — ABNORMAL LOW (ref >59)

## 2024-02-22 MED ORDER — FERROUS SULFATE 325 (65 FE) MG PO TABS
325.0000 mg | ORAL_TABLET | Freq: Every day | ORAL | 2 refills | Status: AC
  Filled 2024-02-22: qty 100, 100d supply, fill #0
  Filled 2024-06-07: qty 100, 100d supply, fill #1

## 2024-02-26 ENCOUNTER — Other Ambulatory Visit: Payer: Self-pay

## 2024-02-26 ENCOUNTER — Ambulatory Visit: Attending: Family Medicine

## 2024-02-26 DIAGNOSIS — Z23 Encounter for immunization: Secondary | ICD-10-CM

## 2024-02-26 NOTE — Progress Notes (Signed)
Shingrix vaccine administered per protocols.  Information sheet given. Patient denies and pain or discomfort at injection site. Tolerated injection well no reaction.

## 2024-03-04 ENCOUNTER — Ambulatory Visit

## 2024-03-04 DIAGNOSIS — L814 Other melanin hyperpigmentation: Secondary | ICD-10-CM | POA: Diagnosis not present

## 2024-03-04 DIAGNOSIS — L821 Other seborrheic keratosis: Secondary | ICD-10-CM

## 2024-03-04 DIAGNOSIS — C44311 Basal cell carcinoma of skin of nose: Secondary | ICD-10-CM

## 2024-03-04 DIAGNOSIS — D492 Neoplasm of unspecified behavior of bone, soft tissue, and skin: Secondary | ICD-10-CM | POA: Diagnosis not present

## 2024-03-04 DIAGNOSIS — C4491 Basal cell carcinoma of skin, unspecified: Secondary | ICD-10-CM

## 2024-03-04 DIAGNOSIS — L578 Other skin changes due to chronic exposure to nonionizing radiation: Secondary | ICD-10-CM

## 2024-03-04 DIAGNOSIS — W908XXA Exposure to other nonionizing radiation, initial encounter: Secondary | ICD-10-CM

## 2024-03-04 HISTORY — DX: Basal cell carcinoma of skin, unspecified: C44.91

## 2024-03-04 NOTE — Patient Instructions (Addendum)

## 2024-03-04 NOTE — Progress Notes (Signed)
    Subjective   Christina Clark is a 73 y.o. female who presents for the following: Patient c/o a growth her nose for ~ 1 year now growing and changing. Patient is new patient  Daughter/translator is with patient and contributes to history.   Review of Systems:    No other skin or systemic complaints except as noted in HPI or Assessment and Plan.  The following portions of the chart were reviewed this encounter and updated as appropriate: medications, allergies, medical history  Relevant Medical History:  reviewed  Objective  Well appearing patient in no apparent distress; mood and affect are within normal limits. Examination was performed of the: Focused Exam of: fafce,arms    Examination notable for: Lentigo/lentigines: Scattered pigmented macules that are tan to brown in color and are somewhat non-uniform in shape and concentrated in the sun-exposed areas, Seborrheic Keratosis(es): Stuck-on appearing keratotic papule(s) on the trunk, none  irritated with redness, crusting, edema, and/or partial avulsion, Actinic Damage/Elastosis: chronic sun damage: dyspigmentation, telangiectasia, and wrinkling  Examination limited by: clothing, deferred removal   left nasal ala 0.9 cm pink exophytic papule         Assessment & Plan   BENIGN SKIN FINDINGS  - Lentigines  - Seborrheic keratoses - Reassurance provided regarding the benign appearance of lesions noted on exam today; no treatment is indicated in the absence of symptoms/changes. - Reinforced importance of photoprotective strategies including liberal and frequent sunscreen use of a broad-spectrum SPF 30 or greater, use of protective clothing, and sun avoidance for prevention of cutaneous malignancy and photoaging.  Counseled patient on the importance of regular self-skin monitoring as well as routine clinical skin examinations as scheduled.   ACTINIC DAMAGE - Chronic condition, secondary to cumulative UV/sun exposure -  Recommend daily broad spectrum sunscreen SPF 30+ to sun-exposed areas, reapply every 2 hours as needed.  - Staying in the shade or wearing long sleeves, sun glasses (UVA+UVB protection) and wide brim hats (4-inch brim around the entire circumference of the hat) are also recommended for sun protection.  - Call for new or changing lesions.  Level of service outlined above   Procedures, orders, diagnosis for this visit:  NEOPLASM OF SKIN left nasal ala Skin / nail biopsy Type of biopsy: tangential   Informed consent: discussed and consent obtained   Patient was prepped and draped in usual sterile fashion: area prepped with alochol. Anesthesia: the lesion was anesthetized in a standard fashion   Anesthetic:  1% lidocaine w/ epinephrine 1-100,000 buffered w/ 8.4% NaHCO3 Instrument used: flexible razor blade   Hemostasis achieved with: pressure, aluminum chloride and electrodesiccation   Outcome: patient tolerated procedure well   Post-procedure details: wound care instructions given   Post-procedure details comment:  Ointment and small bandage  Specimen 1 - Surgical pathology Differential Diagnosis: BCC vs Angioma vs Nevus vs other  Check Margins: No BCC vs Angioma vs Nevus vs other   Neoplasm of skin -     Skin / nail biopsy -     Surgical pathology; Standing    Return to clinic: Return if symptoms worsen or fail to improve.  IFay Kirks, CMA, am acting as scribe for Lauraine JAYSON Kanaris, MD .   Documentation: I have reviewed the above documentation for accuracy and completeness, and I agree with the above.  Lauraine JAYSON Kanaris, MD

## 2024-03-07 ENCOUNTER — Ambulatory Visit: Payer: Self-pay

## 2024-03-07 DIAGNOSIS — C44311 Basal cell carcinoma of skin of nose: Secondary | ICD-10-CM

## 2024-03-07 LAB — SURGICAL PATHOLOGY

## 2024-03-07 NOTE — Telephone Encounter (Signed)
-----   Message from Lauraine JAYSON Kanaris sent at 03/07/2024  3:35 PM EST -----    1. Skin, left nasal ala :       BASAL CELL CARCINOMA, NODULAR PATTERN   Please notify patient with below plan: Mohs   ----- Message ----- From: Interface, Lab In Three Zero One Sent: 03/07/2024   3:13 PM EST To: Lauraine JAYSON Kanaris, MD

## 2024-03-07 NOTE — Telephone Encounter (Signed)
 Discussed pathology results with pt's daughter Kimberlee, and she would like referral sent to [facility] Mohs clinic. Referral emailed to [email or web address]

## 2024-03-14 ENCOUNTER — Other Ambulatory Visit: Payer: Self-pay

## 2024-03-15 ENCOUNTER — Ambulatory Visit: Payer: Self-pay | Admitting: *Deleted

## 2024-03-15 ENCOUNTER — Other Ambulatory Visit: Payer: Self-pay

## 2024-03-15 NOTE — Telephone Encounter (Signed)
 Interpreter ID # [id number] used to communicate with patient.    FYI Only or Action Required?: FYI only for provider: appointment scheduled on 04/17/24.  Patient was last seen in primary care on 02/21/2024 by Theotis Haze ORN, NP.  Called Nurse Triage reporting Advice Only.  Symptoms began did not give specific time, has had hearing aids x 20 years.  Interventions attempted: Other: readjust hearing aids.  Symptoms are: gradually worsening.  Triage Disposition: Call PCP When Office is Open  Patient/caregiver understands and will follow disposition?: Yes  Please advise if referral needed to re evaluate hearing test or see eye dr if new prescription for glasses since eye surgery in [facility].              Copied from CRM #[id number]. Topic: Clinical - Red Word Triage >> Mar 15, 2024 11:55 AM Christina Clark wrote: Red Word that prompted transfer to Nurse Triage: hearing aids malfunction Reason for Disposition  [1] Caller requesting NON-URGENT health information AND [2] PCP's office is the best resource  Answer Assessment - Initial Assessment Questions Appt scheduled for 04/17/24 f/u with PCP per patient request. Reports hearing aids malfunctioning at times sound goes in and out. Has had hearing aids x 20 years. Please advise      1. REASON FOR CALL: What is the main reason for your call? or How can I best help you?     Requesting new hearing aids. Not working properly. Can patient get new hearing aids? 2. SYMPTOMS : Do you have any symptoms?      Hearing goes in and out like it cuts off. 3. OTHER QUESTIONS: Do you have any other questions?     Has had glasses for extended time and requesting replacement . S/p right eye surgery in [facility] and now needs reassessment of eyes.  Protocols used: Information Only Call - No Triage-A-AH

## 2024-03-17 ENCOUNTER — Other Ambulatory Visit (INDEPENDENT_AMBULATORY_CARE_PROVIDER_SITE_OTHER): Payer: Self-pay | Admitting: Primary Care

## 2024-03-17 DIAGNOSIS — H9190 Unspecified hearing loss, unspecified ear: Secondary | ICD-10-CM

## 2024-03-17 DIAGNOSIS — H539 Unspecified visual disturbance: Secondary | ICD-10-CM

## 2024-03-29 ENCOUNTER — Encounter (INDEPENDENT_AMBULATORY_CARE_PROVIDER_SITE_OTHER): Payer: Self-pay

## 2024-04-01 ENCOUNTER — Other Ambulatory Visit: Payer: Self-pay

## 2024-04-03 ENCOUNTER — Other Ambulatory Visit: Payer: Self-pay

## 2024-04-08 ENCOUNTER — Other Ambulatory Visit: Payer: Self-pay

## 2024-04-17 ENCOUNTER — Encounter: Payer: Self-pay | Admitting: Nurse Practitioner

## 2024-04-17 ENCOUNTER — Ambulatory Visit: Attending: Nurse Practitioner | Admitting: Nurse Practitioner

## 2024-04-17 ENCOUNTER — Other Ambulatory Visit: Payer: Self-pay

## 2024-04-17 VITALS — BP 111/63 | HR 72 | Ht 64.0 in | Wt 235.0 lb

## 2024-04-17 DIAGNOSIS — K644 Residual hemorrhoidal skin tags: Secondary | ICD-10-CM | POA: Diagnosis not present

## 2024-04-17 DIAGNOSIS — I10 Essential (primary) hypertension: Secondary | ICD-10-CM

## 2024-04-17 DIAGNOSIS — Z7984 Long term (current) use of oral hypoglycemic drugs: Secondary | ICD-10-CM

## 2024-04-17 DIAGNOSIS — H538 Other visual disturbances: Secondary | ICD-10-CM | POA: Diagnosis not present

## 2024-04-17 DIAGNOSIS — E119 Type 2 diabetes mellitus without complications: Secondary | ICD-10-CM

## 2024-04-17 MED ORDER — LIDOCAINE 5 % EX OINT
1.0000 | TOPICAL_OINTMENT | CUTANEOUS | 0 refills | Status: AC | PRN
  Filled 2024-04-17: qty 50, 20d supply, fill #0

## 2024-04-17 NOTE — Patient Instructions (Addendum)
 [facility]   [address] [place], [place] [address]                                     Ph# [phone] Fax [phone]  [facility] Address: [address] [place], [place] [address] Phone: [phone]

## 2024-04-17 NOTE — Progress Notes (Signed)
 Assessment & Plan:  Christina Clark was seen today for medical management of chronic issues and hemorrhoids.  Diagnoses and all orders for this visit:  Diabetes mellitus treated with oral medication (HCC) -     Ambulatory referral to Ophthalmology  External hemorrhoids -     lidocaine  (XYLOCAINE ) 5 % ointment; Apply 1 Application topically as needed. -     Ambulatory referral to Ophthalmology  Blurred vision [facility]   [address] [place], [place] [address]                                     Ph# [phone]    Primary hypertension Continue all antihypertensives as prescribed.  Reminded to bring in blood pressure log for follow  up appointment.  RECOMMENDATIONS: DASH/Mediterranean Diets are healthier choices for HTN.      Patient has been counseled on age-appropriate routine health concerns for screening and prevention. These are reviewed and up-to-date. Referrals have been placed accordingly. Immunizations are up-to-date or declined.    Subjective:   Chief Complaint  Patient presents with   Medical Management of Chronic Issues   Hemorrhoids    Christina Clark 73 y.o. female presents to office today accompanied by her daughter today requesting referral to ophthalmology. Her daughter is interpreting for her today  She has a past medical history of Chronic kidney disease, Depression, DM 2,, Hyperlipidemia, and Hypertension.    HTN Blood pressure is not quite at goal today.  She is taking losartan  50 mg daily and hydrochlorothiazide  12.5 mg daily as prescribed.  BP Readings from Last 3 Encounters:  04/17/24 111/63  02/21/24 (!) 144/72  09/20/23 128/70    She has an upcoming appointment with ENT for hearing loss.   Requesting referral to Ophthalmology due to blurred vision. Also overdue for diabetic eye exam. No acute vision changes  Has been using OTC hemorrhoidal cream for external hemorrhoids. Hemorrhoids have almost resolved however she does not feel  cream is effective in relieving the hemorrhoidal pain.    Review of Systems  Constitutional:  Negative for fever, malaise/fatigue and weight loss.  HENT:  Positive for hearing loss. Negative for nosebleeds.   Eyes:  Positive for blurred vision. Negative for double vision and photophobia.  Respiratory: Negative.  Negative for cough and shortness of breath.   Cardiovascular: Negative.  Negative for chest pain, palpitations and leg swelling.  Gastrointestinal:  Positive for constipation.       Hemorrhoids  Musculoskeletal: Negative.  Negative for myalgias.  Neurological: Negative.  Negative for dizziness, focal weakness, seizures and headaches.  Psychiatric/Behavioral: Negative.  Negative for suicidal ideas.     Past Medical History:  Diagnosis Date   Basal cell carcinoma 03/04/2024   L nasal ala - referred to [facility] for Mohs   Chronic kidney disease    Depression    After husbands death, takes medication   Diabetes mellitus without complication (HCC)    Hyperlipidemia    Hypertension     Past Surgical History:  Procedure Laterality Date   cataract surgery      Family History  Problem Relation Age of Onset   Colon cancer Mother    Breast cancer Sister 48   Heart attack Brother    Diabetes Neg Hx    Hypertension Neg Hx     Social History Reviewed with no changes to be made today.   Outpatient Medications Prior  to Visit  Medication Sig Dispense Refill   Accu-Chek Softclix Lancets lancets Use to check blood sugar 2 times daily. 100 each 6   Blood Glucose Monitoring Suppl (ACCU-CHEK GUIDE) w/Device KIT Use to check blood sugar 2 times daily. 1 kit 0   diclofenac  Sodium (VOLTAREN  ARTHRITIS PAIN) 1 % GEL Apply 4 grams topically 4 (four) times daily. 200 g 1   ferrous sulfate  325 (65 FE) MG tablet Take 1 tablet (325 mg total) by mouth daily with breakfast. 180 tablet 2   folic acid  (V-R FOLIC ACID ) 400 MCG tablet Take 1 tablet (400 mcg total) by mouth daily. 90 tablet 2    glucose blood (ACCU-CHEK GUIDE TEST) test strip Use to check blood sugar 2 times daily. 100 each 6   hydrochlorothiazide  (HYDRODIURIL ) 25 MG tablet Take 0.5 tablets (12.5 mg total) by mouth daily. For blood pressure. 45 tablet 1   losartan  (COZAAR ) 50 MG tablet Take 1 tablet (50 mg total) by mouth daily. For blood pressure. 90 tablet 1   metFORMIN  (GLUCOPHAGE ) 500 MG tablet Take 1 tablet (500 mg total) by mouth 2 (two) times daily with a meal. For diabetes. 180 tablet 1   omeprazole  (PRILOSEC) 20 MG capsule Take 1 capsule (20 mg total) by mouth daily. For heartburn or acid reflux. 90 capsule 1   pravastatin  (PRAVACHOL ) 40 MG tablet Take 1 tablet (40 mg total) by mouth daily. For cholesterol. Please fill as a 90 day supply 90 tablet 2   sertraline  (ZOLOFT ) 50 MG tablet Take 1 tablet (50 mg total) by mouth daily. For depression. 90 tablet 1   traMADol  (ULTRAM ) 50 MG tablet Take 1 tablet (50 mg total) by mouth daily as needed for severe pain (pain score 7-10). 30 tablet 0   No facility-administered medications prior to visit.    Allergies[1]     Objective:    BP 111/63 (BP Location: Left Arm, Patient Position: Sitting, Cuff Size: Large)   Pulse 72   Ht 5' 4 (1.626 m)   Wt 235 lb (106.6 kg)   SpO2 100%   BMI 40.34 kg/m  Wt Readings from Last 3 Encounters:  04/17/24 235 lb (106.6 kg)  02/21/24 239 lb 12.8 oz (108.8 kg)  09/20/23 240 lb 6.4 oz (109 kg)    Physical Exam Vitals and nursing note reviewed.  Constitutional:      Appearance: She is well-developed.  HENT:     Head: Normocephalic and atraumatic.  Cardiovascular:     Rate and Rhythm: Normal rate and regular rhythm.     Heart sounds: Murmur heard.     No friction rub. No gallop.  Pulmonary:     Effort: Pulmonary effort is normal. No tachypnea or respiratory distress.     Breath sounds: Normal breath sounds. No decreased breath sounds, wheezing, rhonchi or rales.  Chest:     Chest wall: No tenderness.  Abdominal:      General: Bowel sounds are normal.     Palpations: Abdomen is soft.  Musculoskeletal:        General: Normal range of motion.     Cervical back: Normal range of motion.  Skin:    General: Skin is warm and dry.  Neurological:     Mental Status: She is alert and oriented to person, place, and time.     Coordination: Coordination normal.  Psychiatric:        Behavior: Behavior normal. Behavior is cooperative.        Thought Content:  Thought content normal.        Judgment: Judgment normal.          Patient has been counseled extensively about nutrition and exercise as well as the importance of adherence with medications and regular follow-up. The patient was given clear instructions to go to ER or return to medical center if symptoms don't improve, worsen or new problems develop. The patient verbalized understanding.   Follow-up: No follow-ups on file.   Haze LELON Servant, FNP-BC [facility] [place], [place] [phone]   04/17/2024, 4:23 PM    [1]  Allergies Allergen Reactions   Chocolate     Reports vertigo sx   Coffee Bean Extract [Coffea Arabica]

## 2024-04-22 ENCOUNTER — Inpatient Hospital Stay

## 2024-04-22 ENCOUNTER — Inpatient Hospital Stay: Admitting: Oncology

## 2024-04-29 ENCOUNTER — Ambulatory Visit: Payer: Self-pay | Admitting: Oncology

## 2024-04-29 ENCOUNTER — Inpatient Hospital Stay: Admitting: Oncology

## 2024-04-29 ENCOUNTER — Encounter: Payer: Self-pay | Admitting: Oncology

## 2024-04-29 ENCOUNTER — Inpatient Hospital Stay: Attending: Oncology

## 2024-04-29 VITALS — BP 125/65 | HR 66 | Temp 98.7°F | Resp 18 | Wt 237.2 lb

## 2024-04-29 DIAGNOSIS — D7589 Other specified diseases of blood and blood-forming organs: Secondary | ICD-10-CM | POA: Diagnosis not present

## 2024-04-29 DIAGNOSIS — N1831 Chronic kidney disease, stage 3a: Secondary | ICD-10-CM | POA: Diagnosis not present

## 2024-04-29 DIAGNOSIS — D72829 Elevated white blood cell count, unspecified: Secondary | ICD-10-CM | POA: Insufficient documentation

## 2024-04-29 DIAGNOSIS — I129 Hypertensive chronic kidney disease with stage 1 through stage 4 chronic kidney disease, or unspecified chronic kidney disease: Secondary | ICD-10-CM | POA: Diagnosis present

## 2024-04-29 DIAGNOSIS — Z800 Family history of malignant neoplasm of digestive organs: Secondary | ICD-10-CM | POA: Insufficient documentation

## 2024-04-29 DIAGNOSIS — D72820 Lymphocytosis (symptomatic): Secondary | ICD-10-CM | POA: Diagnosis not present

## 2024-04-29 DIAGNOSIS — E1122 Type 2 diabetes mellitus with diabetic chronic kidney disease: Secondary | ICD-10-CM | POA: Diagnosis not present

## 2024-04-29 DIAGNOSIS — D631 Anemia in chronic kidney disease: Secondary | ICD-10-CM

## 2024-04-29 DIAGNOSIS — Z85828 Personal history of other malignant neoplasm of skin: Secondary | ICD-10-CM | POA: Insufficient documentation

## 2024-04-29 DIAGNOSIS — Z79899 Other long term (current) drug therapy: Secondary | ICD-10-CM | POA: Insufficient documentation

## 2024-04-29 DIAGNOSIS — Z791 Long term (current) use of non-steroidal anti-inflammatories (NSAID): Secondary | ICD-10-CM | POA: Insufficient documentation

## 2024-04-29 DIAGNOSIS — E785 Hyperlipidemia, unspecified: Secondary | ICD-10-CM | POA: Insufficient documentation

## 2024-04-29 DIAGNOSIS — R1013 Epigastric pain: Secondary | ICD-10-CM | POA: Diagnosis not present

## 2024-04-29 DIAGNOSIS — Z803 Family history of malignant neoplasm of breast: Secondary | ICD-10-CM | POA: Insufficient documentation

## 2024-04-29 DIAGNOSIS — N189 Chronic kidney disease, unspecified: Secondary | ICD-10-CM | POA: Insufficient documentation

## 2024-04-29 DIAGNOSIS — Z7984 Long term (current) use of oral hypoglycemic drugs: Secondary | ICD-10-CM | POA: Diagnosis not present

## 2024-04-29 LAB — IRON AND TIBC
Iron: 84 ug/dL (ref 28–170)
Saturation Ratios: 26 % (ref 10.4–31.8)
TIBC: 319 ug/dL (ref 250–450)
UIBC: 235 ug/dL

## 2024-04-29 LAB — CBC WITH DIFFERENTIAL/PLATELET
Abs Immature Granulocytes: 0.06 K/uL (ref 0.00–0.07)
Basophils Absolute: 0.1 K/uL (ref 0.0–0.1)
Basophils Relative: 1 %
Eosinophils Absolute: 0.2 K/uL (ref 0.0–0.5)
Eosinophils Relative: 2 %
HCT: 29.6 % — ABNORMAL LOW (ref 36.0–46.0)
Hemoglobin: 9.9 g/dL — ABNORMAL LOW (ref 12.0–15.0)
Immature Granulocytes: 1 %
Lymphocytes Relative: 49 %
Lymphs Abs: 6.1 K/uL — ABNORMAL HIGH (ref 0.7–4.0)
MCH: 35.5 pg — ABNORMAL HIGH (ref 26.0–34.0)
MCHC: 33.4 g/dL (ref 30.0–36.0)
MCV: 106.1 fL — ABNORMAL HIGH (ref 80.0–100.0)
Monocytes Absolute: 1.4 K/uL — ABNORMAL HIGH (ref 0.1–1.0)
Monocytes Relative: 11 %
Neutro Abs: 4.4 K/uL (ref 1.7–7.7)
Neutrophils Relative %: 36 %
Platelets: 325 K/uL (ref 150–400)
RBC: 2.79 MIL/uL — ABNORMAL LOW (ref 3.87–5.11)
RDW: 15.7 % — ABNORMAL HIGH (ref 11.5–15.5)
WBC: 12.2 K/uL — ABNORMAL HIGH (ref 4.0–10.5)
nRBC: 0 % (ref 0.0–0.2)

## 2024-04-29 LAB — RETIC PANEL
Immature Retic Fract: 17.4 % — ABNORMAL HIGH (ref 2.3–15.9)
RBC.: 2.81 MIL/uL — ABNORMAL LOW (ref 3.87–5.11)
Retic Count, Absolute: 54.8 K/uL (ref 19.0–186.0)
Retic Ct Pct: 2 % (ref 0.4–3.1)
Reticulocyte Hemoglobin: 37 pg

## 2024-04-29 LAB — HEPATIC FUNCTION PANEL
ALT: 21 U/L (ref 0–44)
AST: 15 U/L (ref 15–41)
Albumin: 4.3 g/dL (ref 3.5–5.0)
Alkaline Phosphatase: 53 U/L (ref 38–126)
Bilirubin, Direct: 0.2 mg/dL (ref 0.0–0.2)
Indirect Bilirubin: 0.2 mg/dL — ABNORMAL LOW (ref 0.3–0.9)
Total Bilirubin: 0.4 mg/dL (ref 0.0–1.2)
Total Protein: 8.1 g/dL (ref 6.5–8.1)

## 2024-04-29 LAB — LACTATE DEHYDROGENASE: LDH: 211 U/L (ref 105–235)

## 2024-04-29 LAB — FERRITIN: Ferritin: 167 ng/mL (ref 11–307)

## 2024-04-29 LAB — VITAMIN B12: Vitamin B-12: 388 pg/mL (ref 180–914)

## 2024-04-29 LAB — FOLATE: Folate: 11.5 ng/mL

## 2024-04-29 NOTE — Progress Notes (Addendum)
 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 Check CBC, smear, multiple myeloma panel, light chain ratio, LDH, iron TIBC ferritin, folate B12  Leukocytosis Chronic leukocytosis, predominantly lymphocytosis since 2022.  She has no constitutional symptoms. I will check flow cytometry, HIV and hepatitis panel.  Spanish interpreter service was used for the entire encounter.  Orders Placed This Encounter  Procedures   CBC with Differential/Platelet    Standing Status:   Future    Number of Occurrences:   1    Expected Date:   04/29/2024    Expiration Date:   07/28/2024   Retic Panel    Standing Status:   Future    Number of Occurrences:   1    Expected Date:   04/29/2024    Expiration Date:   07/28/2024   Iron and TIBC    Standing Status:   Future    Number of Occurrences:   1    Expected Date:   04/29/2024    Expiration Date:   07/28/2024   Ferritin    Standing Status:   Future    Number of Occurrences:   1    Expected Date:   04/29/2024    Expiration Date:   07/28/2024   Folate    Standing Status:   Future    Number of Occurrences:   1    Expected Date:   04/29/2024    Expiration Date:   07/28/2024   Vitamin B12    Standing Status:   Future    Number of Occurrences:   1    Expected Date:   04/29/2024    Expiration Date:   07/28/2024   Lactate dehydrogenase    Standing Status:   Future    Number of  Occurrences:   1    Expected Date:   04/29/2024    Expiration Date:   07/28/2024   Haptoglobin    Standing Status:   Future    Number of Occurrences:   1    Expected Date:   04/29/2024    Expiration Date:   07/28/2024   Kappa/lambda light chains    Standing Status:   Future    Number of Occurrences:   1    Expected Date:   04/29/2024    Expiration Date:   07/28/2024   Protein electrophoresis, serum    Standing Status:   Future    Number of Occurrences:   1    Expected Date:   04/29/2024    Expiration Date:   07/28/2024   Hepatic function panel    Standing Status:   Future    Number of Occurrences:   1    Expected Date:   04/29/2024    Expiration Date:   04/29/2025   Follow-up in a few weeks to review results. All questions were answered. The patient knows to call the clinic with any problems, questions or concerns.  Call Babara, MD, PhD  [facility] Hematology Oncology 04/29/2024   HISTORY OF PRESENTING ILLNESS:   Christina Clark is a  73 y.o.  female presents for follow up of anemia  09/13/2021, patient CBC done which showed a hemoglobin 10.7.  Mildly elevated WBC 11.4 with predominantly increased lymphocytes and monocytes. Reviewed patient previous lab records. Her hemoglobin was decreased at 10.7 on 07/16/2021.  She had normal hemoglobin between 2021-2022.  11/14/2018, hemoglobin 10.6. Patient denies any unintentional weight loss, night sweats, fever.  She denies acid reflux although her daughter feels that patient may have some acid reflux symptoms.  Patient endorses mild epigastric pain intermittently.  Denies any blood in the stool or black stool.  She occasionally takes Tylenol  or ibuprofen for headache.  Family history positive for mother with colon cancer, sister with breast cancer.    Patient presents to establish care today. Patient denies shortness of breath, chest pain  dizziness syncope.  Denies any rectal bleeding.   Review of Systems  Constitutional:  Positive  for fatigue. Negative for appetite change, chills and fever.  HENT:   Negative for hearing loss and voice change.   Eyes:  Negative for eye problems.  Respiratory:  Negative for chest tightness and cough.   Cardiovascular:  Negative for chest pain.  Gastrointestinal:  Negative for abdominal distention, abdominal pain and blood in stool.  Endocrine: Negative for hot flashes.  Genitourinary:  Negative for difficulty urinating and frequency.   Musculoskeletal:  Negative for arthralgias.  Skin:  Negative for itching and rash.  Neurological:  Negative for extremity weakness.  Hematological:  Negative for adenopathy.  Psychiatric/Behavioral:  Negative for confusion.     MEDICAL HISTORY:  Past Medical History:  Diagnosis Date   Basal cell carcinoma 03/04/2024   L nasal ala - referred to [facility] for Mohs   Chronic kidney disease    Depression    After husbands death, takes medication   Diabetes mellitus without complication (HCC)    Hyperlipidemia    Hypertension     SURGICAL HISTORY: Past Surgical History:  Procedure Laterality Date   cataract surgery      SOCIAL HISTORY: Social History   Socioeconomic History   Marital status: Widowed    Spouse name: Not on file   Number of children: 4   Years of education: Not on file   Highest education level: High school graduate  Occupational History   Not on file  Tobacco Use   Smoking status: Never   Smokeless tobacco: Never  Vaping Use   Vaping status: Never Used  Substance and Sexual Activity   Alcohol use: Not Currently   Drug use: Never   Sexual activity: Not Currently    Birth control/protection: Post-menopausal  Other Topics Concern   Not on file  Social History Narrative   Pt was born in [place]   Social Drivers of Health   Tobacco Use: Low Risk (04/17/2024)   Patient History    Smoking Tobacco Use: Never    Smokeless Tobacco Use: Never    Passive Exposure: Not on file  Financial Resource Strain: Not on file  Food  Insecurity: Food Insecurity Present (04/06/2023)   Hunger Vital Sign    Worried About Running Out of Food in the Last Year: Sometimes true    Ran Out of Food in the Last Year: Sometimes true  Transportation Needs: No Transportation Needs (04/06/2023)   PRAPARE - Administrator, Civil Service (Medical): No    Lack of Transportation (Non-Medical):  No  Physical Activity: Not on file  Stress: Not on file  Social Connections: Not on file  Intimate Partner Violence: Not on file  Depression (PHQ2-9): Medium Risk (02/21/2024)   Depression (PHQ2-9)    PHQ-2 Score: 6  Alcohol Screen: Not on file  Housing: Not on file  Utilities: Not on file  Health Literacy: Not on file    FAMILY HISTORY: Family History  Problem Relation Age of Onset   Colon cancer Mother    Breast cancer Sister 56   Heart attack Brother    Diabetes Neg Hx    Hypertension Neg Hx     ALLERGIES:  is allergic to chocolate and coffee bean extract [coffea arabica].  MEDICATIONS:  Current Outpatient Medications  Medication Sig Dispense Refill   Accu-Chek Softclix Lancets lancets Use to check blood sugar 2 times daily. 100 each 6   Blood Glucose Monitoring Suppl (ACCU-CHEK GUIDE) w/Device KIT Use to check blood sugar 2 times daily. 1 kit 0   diclofenac  Sodium (VOLTAREN  ARTHRITIS PAIN) 1 % GEL Apply 4 grams topically 4 (four) times daily. 200 g 1   ferrous sulfate  325 (65 FE) MG tablet Take 1 tablet (325 mg total) by mouth daily with breakfast. 180 tablet 2   folic acid  (V-R FOLIC ACID ) 400 MCG tablet Take 1 tablet (400 mcg total) by mouth daily. 90 tablet 2   glucose blood (ACCU-CHEK GUIDE TEST) test strip Use to check blood sugar 2 times daily. 100 each 6   hydrochlorothiazide  (HYDRODIURIL ) 25 MG tablet Take 0.5 tablets (12.5 mg total) by mouth daily. For blood pressure. 45 tablet 1   lidocaine  (XYLOCAINE ) 5 % ointment Apply 1 Application topically as needed. 50 g 0   losartan  (COZAAR ) 50 MG tablet Take 1 tablet  (50 mg total) by mouth daily. For blood pressure. 90 tablet 1   metFORMIN  (GLUCOPHAGE ) 500 MG tablet Take 1 tablet (500 mg total) by mouth 2 (two) times daily with a meal. For diabetes. 180 tablet 1   omeprazole  (PRILOSEC) 20 MG capsule Take 1 capsule (20 mg total) by mouth daily. For heartburn or acid reflux. 90 capsule 1   pravastatin  (PRAVACHOL ) 40 MG tablet Take 1 tablet (40 mg total) by mouth daily. For cholesterol. Please fill as a 90 day supply 90 tablet 2   sertraline  (ZOLOFT ) 50 MG tablet Take 1 tablet (50 mg total) by mouth daily. For depression. 90 tablet 1   traMADol  (ULTRAM ) 50 MG tablet Take 1 tablet (50 mg total) by mouth daily as needed for severe pain (pain score 7-10). 30 tablet 0   No current facility-administered medications for this visit.     PHYSICAL EXAMINATION:  Vitals:   04/29/24 1340  BP: 125/65  Pulse: 66  Resp: 18  Temp: 98.7 F (37.1 C)   Filed Weights   04/29/24 1340  Weight: 237 lb 3.2 oz (107.6 kg)    Physical Exam Constitutional:      General: She is not in acute distress.    Appearance: She is obese.  HENT:     Head: Normocephalic and atraumatic.  Eyes:     General: No scleral icterus. Cardiovascular:     Rate and Rhythm: Normal rate and regular rhythm.     Heart sounds: Murmur heard.  Pulmonary:     Effort: Pulmonary effort is normal. No respiratory distress.     Breath sounds: No wheezing.  Abdominal:     General: Bowel sounds are normal. There is no distension.  Palpations: Abdomen is soft.  Musculoskeletal:        General: No deformity. Normal range of motion.     Cervical back: Normal range of motion and neck supple.  Skin:    General: Skin is warm and dry.     Findings: No erythema or rash.  Neurological:     Mental Status: She is alert and oriented to person, place, and time. Mental status is at baseline.     Cranial Nerves: No cranial nerve deficit.     Coordination: Coordination normal.  Psychiatric:        Mood and  Affect: Mood normal.     LABORATORY DATA:  I have reviewed the data as listed Lab Results  Component Value Date   WBC 12.2 (H) 04/29/2024   HGB 9.9 (L) 04/29/2024   HCT 29.6 (L) 04/29/2024   MCV 106.1 (H) 04/29/2024   PLT 325 04/29/2024   Recent Labs    09/20/23 1632 02/21/24 0922 04/29/24 1410  NA 140 140  --   K 4.8 4.8  --   CL 103 105  --   CO2 23 23  --   GLUCOSE 109* 112*  --   BUN 24 22  --   CREATININE 1.21* 1.08*  --   CALCIUM 9.1 9.3  --   PROT 7.6 7.1 8.1  ALBUMIN 4.1 4.0 4.3  AST 16 12 15   ALT 27 21 21   ALKPHOS 52 48* 53  BILITOT 0.3 0.4 0.4  BILIDIR  --   --  0.2  IBILI  --   --  0.2*   Iron/TIBC/Ferritin/ %Sat    Component Value Date/Time   IRON 84 04/29/2024 1410   IRON 84 07/15/2022 1633   TIBC 319 04/29/2024 1410   TIBC 284 07/15/2022 1633   FERRITIN 167 04/29/2024 1410   FERRITIN 177 (H) 07/15/2022 1633   IRONPCTSAT 26 04/29/2024 1410   IRONPCTSAT 30 07/15/2022 1633      RADIOGRAPHIC STUDIES: I have personally reviewed the radiological images as listed and agreed with the findings in the report. No results found.  "

## 2024-04-29 NOTE — Assessment & Plan Note (Signed)
 Chronic leukocytosis, predominantly lymphocytosis since 2022.  She has no constitutional symptoms. I will check flow cytometry, HIV and hepatitis panel.

## 2024-04-29 NOTE — Assessment & Plan Note (Addendum)
 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 Check CBC, smear, multiple myeloma panel, light chain ratio, LDH, iron TIBC ferritin, folate B12

## 2024-04-30 LAB — PROTEIN ELECTROPHORESIS, SERUM
A/G Ratio: 0.9 (ref 0.7–1.7)
Albumin ELP: 3.4 g/dL (ref 2.9–4.4)
Alpha-1-Globulin: 0.2 g/dL (ref 0.0–0.4)
Alpha-2-Globulin: 0.8 g/dL (ref 0.4–1.0)
Beta Globulin: 1.1 g/dL (ref 0.7–1.3)
Gamma Globulin: 1.9 g/dL — ABNORMAL HIGH (ref 0.4–1.8)
Globulin, Total: 4 g/dL — ABNORMAL HIGH (ref 2.2–3.9)
Total Protein ELP: 7.4 g/dL (ref 6.0–8.5)

## 2024-04-30 LAB — KAPPA/LAMBDA LIGHT CHAINS
Kappa free light chain: 61.9 mg/L — ABNORMAL HIGH (ref 3.3–19.4)
Kappa, lambda light chain ratio: 1.19 (ref 0.26–1.65)
Lambda free light chains: 52.1 mg/L — ABNORMAL HIGH (ref 5.7–26.3)

## 2024-04-30 LAB — HAPTOGLOBIN: Haptoglobin: 102 mg/dL (ref 42–346)

## 2024-05-01 ENCOUNTER — Ambulatory Visit (INDEPENDENT_AMBULATORY_CARE_PROVIDER_SITE_OTHER)

## 2024-05-01 ENCOUNTER — Inpatient Hospital Stay

## 2024-05-01 ENCOUNTER — Encounter (INDEPENDENT_AMBULATORY_CARE_PROVIDER_SITE_OTHER): Payer: Self-pay | Admitting: Otolaryngology

## 2024-05-01 ENCOUNTER — Ambulatory Visit (INDEPENDENT_AMBULATORY_CARE_PROVIDER_SITE_OTHER): Admitting: Otolaryngology

## 2024-05-01 VITALS — BP 136/74 | HR 83 | Ht 64.0 in | Wt 235.0 lb

## 2024-05-01 DIAGNOSIS — I129 Hypertensive chronic kidney disease with stage 1 through stage 4 chronic kidney disease, or unspecified chronic kidney disease: Secondary | ICD-10-CM | POA: Diagnosis not present

## 2024-05-01 DIAGNOSIS — D72820 Lymphocytosis (symptomatic): Secondary | ICD-10-CM

## 2024-05-01 DIAGNOSIS — H903 Sensorineural hearing loss, bilateral: Secondary | ICD-10-CM

## 2024-05-01 DIAGNOSIS — H906 Mixed conductive and sensorineural hearing loss, bilateral: Secondary | ICD-10-CM

## 2024-05-01 NOTE — Progress Notes (Signed)
 Reason for Consult: Hearing loss Referring Physician: Dr. Celestia Hadassah Christina Clark is an 73 y.o. female.  HPI: She has a history of hearing loss for 20 years.  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  She has no ear infections or drainage.  No vertigo.  She has a strong family history of hearing loss.  She wants to see if something else can be done regarding the hearing.  Past Medical History:  Diagnosis Date   Basal cell carcinoma 03/04/2024   L nasal ala - referred to [facility] for Mohs   Chronic kidney disease    Depression    After husbands death, takes medication   Diabetes mellitus without complication (HCC)    Hyperlipidemia    Hypertension     Past Surgical History:  Procedure Laterality Date   cataract surgery      Family History  Problem Relation Age of Onset   Colon cancer Mother    Breast cancer Sister 103   Heart attack Brother    Diabetes Neg Hx    Hypertension Neg Hx     Social History:  reports that she has never smoked. She has never used smokeless tobacco. She reports that she does not currently use alcohol. She reports that she does not use drugs.  Allergies: Allergies[1]   Results for orders placed or performed in visit on 04/29/24 (from the past 48 hours)  Hepatic function panel     Status: Abnormal   Collection Time: 04/29/24  2:10 PM  Result Value Ref Range   Total Protein 8.1 6.5 - 8.1 g/dL   Albumin 4.3 3.5 - 5.0 g/dL   AST 15 15 - 41 U/L   ALT 21 0 - 44 U/L   Alkaline Phosphatase 53 38 - 126 U/L   Total Bilirubin 0.4 0.0 - 1.2 mg/dL   Bilirubin, Direct 0.2 0.0 - 0.2 mg/dL   Indirect Bilirubin 0.2 (L) 0.3 - 0.9 mg/dL    Comment: Performed at [facility], [address] [place], [place] [address]  Protein electrophoresis, serum     Status: Abnormal   Collection Time: 04/29/24  2:10 PM  Result Value Ref Range   Total Protein ELP 7.4 6.0 - 8.5 g/dL   Albumin ELP 3.4 2.9  - 4.4 g/dL   Joeyj-8-Honalopw 0.2 0.0 - 0.4 g/dL   Joeyj-7-Honalopw 0.8 0.4 - 1.0 g/dL   Beta Globulin 1.1 0.7 - 1.3 g/dL   Gamma Globulin 1.9 (H) 0.4 - 1.8 g/dL   M-Spike, % Not Observed Not Observed g/dL   SPE Interp. Comment     Comment: (NOTE) The SPE pattern reflects a polyclonal increase in gamma globulin. Hypergammaglobulinemia is found in a wide variety of infectious, non-infectious, and autoimmune disease states. Evidence of monoclonal protein is not apparent. Performed At: [facility] [address] [place], [place] [id number] Jennette Shorter MD Ey:[id number]    Comment Comment     Comment: (NOTE) Protein electrophoresis scan will follow via computer, mail, or courier delivery.    Globulin, Total 4.0 (H) 2.2 - 3.9 g/dL   A/G Ratio 0.9 0.7 - 1.7  Kappa/lambda light chains     Status: Abnormal   Collection Time: 04/29/24  2:10 PM  Result Value Ref Range   Kappa free light chain 61.9 (H) 3.3 - 19.4 mg/L   Lambda free light chains 52.1 (H) 5.7 - 26.3 mg/L   Kappa,  lambda light chain ratio 1.19 0.26 - 1.65    Comment: (NOTE) Performed At: [facility] [address] [place], [place] [id number] Jennette Shorter MD Ey:[id number]   Haptoglobin     Status: None   Collection Time: 04/29/24  2:10 PM  Result Value Ref Range   Haptoglobin 102 42 - 346 mg/dL    Comment: (NOTE) Performed At: [facility] [address] [place], [place] [id number] Jennette Shorter MD Ey:[id number]   Lactate dehydrogenase     Status: None   Collection Time: 04/29/24  2:10 PM  Result Value Ref Range   LDH 211 105 - 235 U/L    Comment: Performed at [facility], [address] [place], [place] [address]  Vitamin B12     Status: None   Collection Time: 04/29/24  2:10 PM  Result Value Ref Range   Vitamin B-12 388 180 - 914 pg/mL    Comment: Performed at [facility], [address], [place], [place] [address]  Folate     Status: None   Collection Time:  04/29/24  2:10 PM  Result Value Ref Range   Folate 11.5 >5.9 ng/mL    Comment: Performed at [facility], [address] [place], [place] [address]  Ferritin     Status: None   Collection Time: 04/29/24  2:10 PM  Result Value Ref Range   Ferritin 167 11 - 307 ng/mL    Comment: Performed at [facility], [address] [place], [place] [address]  Iron and TIBC     Status: None   Collection Time: 04/29/24  2:10 PM  Result Value Ref Range   Iron 84 28 - 170 ug/dL   TIBC 680 749 - 549 ug/dL   Saturation Ratios 26 10.4 - 31.8 %   UIBC 235 ug/dL    Comment: Performed at [facility], [address] [place], [place] [address]  CBC with Differential/Platelet     Status: Abnormal   Collection Time: 04/29/24  2:10 PM  Result Value Ref Range   WBC 12.2 (H) 4.0 - 10.5 K/uL   RBC 2.79 (L) 3.87 - 5.11 MIL/uL   Hemoglobin 9.9 (L) 12.0 - 15.0 g/dL   HCT 70.3 (L) 63.9 - 53.9 %   MCV 106.1 (H) 80.0 - 100.0 fL   MCH 35.5 (H) 26.0 - 34.0 pg   MCHC 33.4 30.0 - 36.0 g/dL   RDW 84.2 (H) 88.4 - 84.4 %   Platelets 325 150 - 400 K/uL   nRBC 0.0 0.0 - 0.2 %   Neutrophils Relative % 36 %   Neutro Abs 4.4 1.7 - 7.7 K/uL   Lymphocytes Relative 49 %   Lymphs Abs 6.1 (H) 0.7 - 4.0 K/uL   Monocytes Relative 11 %   Monocytes Absolute 1.4 (H) 0.1 - 1.0 K/uL   Eosinophils Relative 2 %   Eosinophils Absolute 0.2 0.0 - 0.5 K/uL   Basophils Relative 1 %   Basophils Absolute 0.1 0.0 - 0.1 K/uL   Immature Granulocytes 1 %   Abs Immature Granulocytes 0.06 0.00 - 0.07 K/uL    Comment: Performed at [facility], [address] [place], [place] [address]  Retic Panel     Status: Abnormal   Collection Time: 04/29/24  2:11 PM  Result Value Ref Range   Retic Ct Pct 2.0 0.4 - 3.1 %   RBC. 2.81 (L) 3.87 - 5.11 MIL/uL   Retic Count, Absolute 54.8 19.0 -  186.0 K/uL   Immature Retic Fract 17.4 (H) 2.3 - 15.9 %   Reticulocyte Hemoglobin 37.0 >27.9 pg    Comment:        Given  the high negative predictive value of a RET-He result > 32 pg iron deficiency is essentially excluded. If this patient is anemic other etiologies should be considered. Performed at [facility], [address] [place], [place] [address]     No results found.  ROS Blood pressure 136/74, pulse 83, height 5' 4 (1.626 m), weight 235 lb (106.6 kg), SpO2 90%. Physical Exam Constitutional:      Appearance: Normal appearance.  HENT:     Head: Normocephalic and atraumatic.     Right Ear: Tympanic membrane is without lesions and middle ear aerated, ear canal and external ear normal.     Left Ear: Tympanic membrane is without lesions and middle ear aerated, ear canal and external ear normal.     Nose: Nose without deviation of septum.  Turbinates with mild hypertrophy, No significant swelling or masses.     Oral cavity/oropharynx: Mucous membranes are moist. No lesions or masses    Larynx: normal voice. Mirror attempted without success    Eyes:     Extraocular Movements: Extraocular movements intact.     Conjunctiva/sclera: Conjunctivae normal.     Pupils: Pupils are equal, round, and reactive to light.  Cardiovascular:     Rate and Rhythm: Normal rate.  Pulmonary:     Effort: Pulmonary effort is normal.  Musculoskeletal:     Cervical back: Normal range of motion and neck supple. No rigidity.  Lymphadenopathy:     Cervical: No cervical adenopathy or masses.salivary glands without lesions. .     Salivary glands- no mass or swelling Neurological:     Mental Status: He is alert. CN 2-12 intact. No nystagmus      Assessment/Plan: Bilateral sensorineural hearing loss-will need to get an audiogram and then have audiology discussed hearing aids with her.  I did mention a cochlear implant as an option if hearing aids or not an option and the degree of loss that she has.  She will follow-up with audiology.  Christina Clark 05/01/2024, 9:14 AM        [1]  Allergies Allergen  Reactions   Chocolate     Reports vertigo sx   Coffee Bean Extract [Coffea Arabica]

## 2024-05-01 NOTE — Progress Notes (Addendum)
 " [address], [address] [place], [place] [address] [phone]  Audiological Evaluation    Name: Christina Clark     DOB:   Apr 24, 1951      MRN:   [id number]                                                                                     Service Date: 05/01/2024     Accompanied by: daughter [phone]) and Spanish speaking interrupter Edra)    Patient comes today after Dr. Roark, ENT sent a referral for a hearing evaluation due to concerns with hearing loss.   Symptoms Yes Details  Hearing loss  [x]  Hearing loss in both ears, right ear worse, for the last 20 years  Tinnitus  []  No ringing sounds  Ear pain/ infections/pressure  []  No pain, no history of ear infections  Balance problems  [x]  Had vertigo 5 years ago and exercises improved problem and now balance issues are better  Noise exposure history  []    Previous ear surgeries  []    Family history of hearing loss  [x]  Several family members have hearing loss  Amplification  [x]  H[facility] aid from [facility] for the [facility] ear  Other  [x]  No ear injuries    Otoscopy: Right ear: Clear external ear canal and notable landmarks visualized on the tympanic membrane. Left ear:  Clear external ear canal and notable landmarks visualized on the tympanic membrane.  Tympanometry: Right ear: Type A - Normal external ear canal volume with normal middle ear pressure and normal tympanic membrane compliance. Findings are consistent with normal middle ear function. Left ear: Type A - Normal external ear canal volume with normal middle ear pressure and normal tympanic membrane compliance. Findings are consistent with normal middle ear function.    Hearing Evaluation The hearing test results were completed under headphones and results are deemed to be of fair reliability. 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 Left ear-  moderately-severe  sloping to severe mixed hearing loss from 250 Hz - 8000 Hz (possible vibrotactile responses) Note:  Due to possible vibrotactile responses and normal tympanograms, hearing loss is most likely sensorineural and not a mixed hearing loss in both ears.  Testing to be repeated to verify results.     Speech Audiometry: Right ear- Speech Awareness Threshold (SAT) was observed at 65 dBHL. Left ear-Speech Awareness Threshold (SAT) was observed at 80 dBHL.   Word Recognition Score:  Unable to test due to Spanish Speaking   Impression: There is a significant difference in pure-tone thresholds between ears.  Unable to test word recognition due to spanish speaking.  Due to possible vibrotactile responses and normal tympanograms, hearing loss is most likely sensorineural and not a mixed hearing loss in both ears.  Testing to be repeated to verify results.      Recommendations: Follow up with ENT as scheduled. Repeat audiogram in 1 month with Dr Tiney who is a spanish speak and can perform word recognition testing to determine if  there is any distortion to speech.  Furthermore, testing with Dr. Tiney can verify thresholds due to possible misunderstanding of directions/responses.  Consider a communication needs assessment for amplification after medical clearance is obtained, if needed.   Arlean Rake, AUD  "

## 2024-05-02 LAB — HIV ANTIBODY (ROUTINE TESTING W REFLEX): HIV Screen 4th Generation wRfx: NONREACTIVE

## 2024-05-02 LAB — HEPATITIS PANEL, ACUTE
HCV Ab: NONREACTIVE
Hep A IgM: NONREACTIVE
Hep B C IgM: NONREACTIVE
Hepatitis B Surface Ag: NONREACTIVE

## 2024-05-06 LAB — COMP PANEL: LEUKEMIA/LYMPHOMA

## 2024-05-07 ENCOUNTER — Other Ambulatory Visit: Payer: Self-pay | Admitting: Oncology

## 2024-05-07 DIAGNOSIS — D72820 Lymphocytosis (symptomatic): Secondary | ICD-10-CM

## 2024-05-08 ENCOUNTER — Inpatient Hospital Stay: Attending: Oncology

## 2024-05-08 DIAGNOSIS — D72820 Lymphocytosis (symptomatic): Secondary | ICD-10-CM

## 2024-05-08 LAB — OPHTHALMOLOGY REPORT-SCANNED

## 2024-05-08 NOTE — Progress Notes (Signed)
 Spoke to pt, she will come in today at 11a for labs

## 2024-05-22 ENCOUNTER — Other Ambulatory Visit: Payer: Self-pay

## 2024-05-22 LAB — MISC LABCORP TEST (SEND OUT): Labcorp test code: 481080

## 2024-05-24 ENCOUNTER — Other Ambulatory Visit: Payer: Self-pay | Admitting: Nurse Practitioner

## 2024-05-24 DIAGNOSIS — Z1231 Encounter for screening mammogram for malignant neoplasm of breast: Secondary | ICD-10-CM

## 2024-06-06 ENCOUNTER — Encounter

## 2024-06-10 ENCOUNTER — Ambulatory Visit: Admitting: Medical

## 2024-06-12 ENCOUNTER — Encounter

## 2024-06-17 ENCOUNTER — Inpatient Hospital Stay: Admitting: Oncology

## 2024-06-19 ENCOUNTER — Inpatient Hospital Stay: Admitting: Oncology

## 2024-06-24 ENCOUNTER — Ambulatory Visit: Admitting: Nurse Practitioner
